# Patient Record
Sex: Male | Born: 1965
Health system: Southern US, Community
[De-identification: ages and names within clinical notes are randomized; demographics above are authoritative.]

## PROBLEM LIST (undated history)

## (undated) DIAGNOSIS — D126 Benign neoplasm of colon, unspecified: Secondary | ICD-10-CM

## (undated) DIAGNOSIS — N390 Urinary tract infection, site not specified: Secondary | ICD-10-CM

## (undated) DIAGNOSIS — R7989 Other specified abnormal findings of blood chemistry: Secondary | ICD-10-CM

## (undated) DIAGNOSIS — R600 Localized edema: Secondary | ICD-10-CM

## (undated) DIAGNOSIS — M549 Dorsalgia, unspecified: Secondary | ICD-10-CM

## (undated) DIAGNOSIS — E079 Disorder of thyroid, unspecified: Secondary | ICD-10-CM

## (undated) DIAGNOSIS — N200 Calculus of kidney: Secondary | ICD-10-CM

## (undated) DIAGNOSIS — M1990 Unspecified osteoarthritis, unspecified site: Secondary | ICD-10-CM

## (undated) DIAGNOSIS — E039 Hypothyroidism, unspecified: Secondary | ICD-10-CM

## (undated) DIAGNOSIS — G8929 Other chronic pain: Secondary | ICD-10-CM

## (undated) DIAGNOSIS — E119 Type 2 diabetes mellitus without complications: Secondary | ICD-10-CM

## (undated) DIAGNOSIS — E2740 Unspecified adrenocortical insufficiency: Secondary | ICD-10-CM

## (undated) DIAGNOSIS — G2581 Restless legs syndrome: Secondary | ICD-10-CM

## (undated) DIAGNOSIS — T4145XA Adverse effect of unspecified anesthetic, initial encounter: Secondary | ICD-10-CM

## (undated) DIAGNOSIS — G4730 Sleep apnea, unspecified: Secondary | ICD-10-CM

## (undated) DIAGNOSIS — J45909 Unspecified asthma, uncomplicated: Secondary | ICD-10-CM

## (undated) DIAGNOSIS — M48061 Spinal stenosis, lumbar region without neurogenic claudication: Secondary | ICD-10-CM

## (undated) DIAGNOSIS — M722 Plantar fascial fibromatosis: Secondary | ICD-10-CM

## (undated) DIAGNOSIS — I10 Essential (primary) hypertension: Secondary | ICD-10-CM

## (undated) DIAGNOSIS — Z9889 Other specified postprocedural states: Secondary | ICD-10-CM

## (undated) DIAGNOSIS — T8859XA Other complications of anesthesia, initial encounter: Secondary | ICD-10-CM

## (undated) DIAGNOSIS — K589 Irritable bowel syndrome without diarrhea: Secondary | ICD-10-CM

## (undated) DIAGNOSIS — E785 Hyperlipidemia, unspecified: Secondary | ICD-10-CM

## (undated) DIAGNOSIS — Z87442 Personal history of urinary calculi: Secondary | ICD-10-CM

## (undated) DIAGNOSIS — K219 Gastro-esophageal reflux disease without esophagitis: Secondary | ICD-10-CM

## (undated) HISTORY — PX: CYSTOSCOPY: SUR368

## (undated) HISTORY — DX: Unspecified adrenocortical insufficiency: E27.40

## (undated) HISTORY — DX: Other specified postprocedural states: Z98.890

## (undated) HISTORY — DX: Plantar fascial fibromatosis: M72.2

## (undated) HISTORY — PX: HERNIA REPAIR: SHX51

## (undated) HISTORY — DX: Essential (primary) hypertension: I10

## (undated) HISTORY — DX: Localized edema: R60.0

## (undated) HISTORY — PX: KNEE SURGERY: SHX244

## (undated) HISTORY — DX: Other specified abnormal findings of blood chemistry: R79.89

## (undated) HISTORY — DX: Irritable bowel syndrome, unspecified: K58.9

## (undated) HISTORY — DX: Urinary tract infection, site not specified: N39.0

## (undated) HISTORY — DX: Hyperlipidemia, unspecified: E78.5

## (undated) HISTORY — DX: Benign neoplasm of colon, unspecified: D12.6

---

## 1998-06-04 DIAGNOSIS — D126 Benign neoplasm of colon, unspecified: Secondary | ICD-10-CM

## 1998-06-04 HISTORY — PX: COLONOSCOPY: SHX174

## 1998-06-04 HISTORY — DX: Benign neoplasm of colon, unspecified: D12.6

## 2000-07-25 ENCOUNTER — Inpatient Hospital Stay (HOSPITAL_COMMUNITY): Admission: EM | Admit: 2000-07-25 | Discharge: 2000-07-29 | Payer: Self-pay | Admitting: *Deleted

## 2001-06-04 HISTORY — PX: SPLENECTOMY: SUR1306

## 2001-10-29 ENCOUNTER — Encounter: Payer: Self-pay | Admitting: Emergency Medicine

## 2001-10-29 ENCOUNTER — Inpatient Hospital Stay (HOSPITAL_COMMUNITY): Admission: EM | Admit: 2001-10-29 | Discharge: 2001-10-30 | Payer: Self-pay | Admitting: Emergency Medicine

## 2003-08-04 ENCOUNTER — Emergency Department (HOSPITAL_COMMUNITY): Admission: EM | Admit: 2003-08-04 | Discharge: 2003-08-04 | Payer: Self-pay | Admitting: Emergency Medicine

## 2003-10-08 ENCOUNTER — Ambulatory Visit (HOSPITAL_COMMUNITY): Admission: RE | Admit: 2003-10-08 | Discharge: 2003-10-08 | Payer: Self-pay | Admitting: Internal Medicine

## 2004-06-04 HISTORY — PX: CHOLECYSTECTOMY: SHX55

## 2005-07-02 ENCOUNTER — Emergency Department (HOSPITAL_COMMUNITY): Admission: EM | Admit: 2005-07-02 | Discharge: 2005-07-02 | Payer: Self-pay | Admitting: Emergency Medicine

## 2005-07-13 ENCOUNTER — Ambulatory Visit (HOSPITAL_COMMUNITY): Admission: RE | Admit: 2005-07-13 | Discharge: 2005-07-13 | Payer: Self-pay | Admitting: Family Medicine

## 2005-07-30 ENCOUNTER — Ambulatory Visit: Payer: Self-pay | Admitting: Internal Medicine

## 2005-08-02 ENCOUNTER — Ambulatory Visit (HOSPITAL_COMMUNITY): Admission: RE | Admit: 2005-08-02 | Discharge: 2005-08-02 | Payer: Self-pay | Admitting: Internal Medicine

## 2005-08-02 ENCOUNTER — Encounter: Payer: Self-pay | Admitting: Internal Medicine

## 2005-08-12 ENCOUNTER — Ambulatory Visit: Payer: Self-pay | Admitting: Internal Medicine

## 2005-09-11 ENCOUNTER — Ambulatory Visit: Payer: Self-pay | Admitting: Internal Medicine

## 2005-10-19 ENCOUNTER — Encounter (HOSPITAL_COMMUNITY): Admission: RE | Admit: 2005-10-19 | Discharge: 2005-11-18 | Payer: Self-pay | Admitting: Family Medicine

## 2005-10-26 ENCOUNTER — Encounter (INDEPENDENT_AMBULATORY_CARE_PROVIDER_SITE_OTHER): Payer: Self-pay | Admitting: Specialist

## 2005-10-26 ENCOUNTER — Ambulatory Visit (HOSPITAL_COMMUNITY): Admission: RE | Admit: 2005-10-26 | Discharge: 2005-10-26 | Payer: Self-pay | Admitting: General Surgery

## 2005-11-26 ENCOUNTER — Ambulatory Visit (HOSPITAL_COMMUNITY): Admission: RE | Admit: 2005-11-26 | Discharge: 2005-11-26 | Payer: Self-pay | Admitting: Internal Medicine

## 2006-02-07 ENCOUNTER — Ambulatory Visit (HOSPITAL_BASED_OUTPATIENT_CLINIC_OR_DEPARTMENT_OTHER): Admission: RE | Admit: 2006-02-07 | Discharge: 2006-02-07 | Payer: Self-pay | Admitting: Orthopedic Surgery

## 2006-02-18 ENCOUNTER — Encounter: Admission: RE | Admit: 2006-02-18 | Discharge: 2006-02-28 | Payer: Self-pay | Admitting: Orthopedic Surgery

## 2006-12-16 ENCOUNTER — Ambulatory Visit: Payer: Self-pay | Admitting: Cardiovascular Disease

## 2006-12-16 ENCOUNTER — Inpatient Hospital Stay (HOSPITAL_COMMUNITY): Admission: EM | Admit: 2006-12-16 | Discharge: 2006-12-17 | Payer: Self-pay | Admitting: Emergency Medicine

## 2006-12-20 ENCOUNTER — Encounter (HOSPITAL_COMMUNITY): Admission: RE | Admit: 2006-12-20 | Discharge: 2007-01-19 | Payer: Self-pay | Admitting: Cardiovascular Disease

## 2006-12-20 ENCOUNTER — Ambulatory Visit: Payer: Self-pay | Admitting: Cardiology

## 2007-04-16 ENCOUNTER — Ambulatory Visit (HOSPITAL_COMMUNITY): Admission: RE | Admit: 2007-04-16 | Discharge: 2007-04-16 | Payer: Self-pay | Admitting: Internal Medicine

## 2008-06-04 HISTORY — PX: ESOPHAGOGASTRODUODENOSCOPY: SHX1529

## 2008-08-02 DIAGNOSIS — Z9889 Other specified postprocedural states: Secondary | ICD-10-CM

## 2008-08-02 HISTORY — DX: Other specified postprocedural states: Z98.890

## 2008-08-06 ENCOUNTER — Ambulatory Visit: Payer: Self-pay | Admitting: Internal Medicine

## 2008-08-09 ENCOUNTER — Encounter: Payer: Self-pay | Admitting: Internal Medicine

## 2008-08-10 ENCOUNTER — Ambulatory Visit (HOSPITAL_COMMUNITY): Admission: RE | Admit: 2008-08-10 | Discharge: 2008-08-10 | Payer: Self-pay | Admitting: Internal Medicine

## 2008-08-10 ENCOUNTER — Ambulatory Visit: Payer: Self-pay | Admitting: Internal Medicine

## 2008-08-10 ENCOUNTER — Encounter: Payer: Self-pay | Admitting: Internal Medicine

## 2008-08-10 HISTORY — PX: COLONOSCOPY: SHX174

## 2008-08-13 ENCOUNTER — Encounter: Payer: Self-pay | Admitting: Internal Medicine

## 2008-12-29 ENCOUNTER — Encounter (INDEPENDENT_AMBULATORY_CARE_PROVIDER_SITE_OTHER): Payer: Self-pay | Admitting: *Deleted

## 2008-12-31 ENCOUNTER — Ambulatory Visit (HOSPITAL_COMMUNITY): Admission: RE | Admit: 2008-12-31 | Discharge: 2008-12-31 | Payer: Self-pay | Admitting: Family Medicine

## 2009-01-04 DIAGNOSIS — K828 Other specified diseases of gallbladder: Secondary | ICD-10-CM | POA: Insufficient documentation

## 2009-01-04 DIAGNOSIS — R042 Hemoptysis: Secondary | ICD-10-CM | POA: Insufficient documentation

## 2009-01-04 DIAGNOSIS — K219 Gastro-esophageal reflux disease without esophagitis: Secondary | ICD-10-CM | POA: Insufficient documentation

## 2009-01-04 DIAGNOSIS — K449 Diaphragmatic hernia without obstruction or gangrene: Secondary | ICD-10-CM | POA: Insufficient documentation

## 2009-01-04 DIAGNOSIS — R109 Unspecified abdominal pain: Secondary | ICD-10-CM | POA: Insufficient documentation

## 2009-01-04 DIAGNOSIS — K921 Melena: Secondary | ICD-10-CM | POA: Insufficient documentation

## 2009-01-05 ENCOUNTER — Ambulatory Visit: Payer: Self-pay | Admitting: Internal Medicine

## 2009-01-05 DIAGNOSIS — R1310 Dysphagia, unspecified: Secondary | ICD-10-CM | POA: Insufficient documentation

## 2009-01-05 DIAGNOSIS — K589 Irritable bowel syndrome without diarrhea: Secondary | ICD-10-CM | POA: Insufficient documentation

## 2009-01-05 DIAGNOSIS — R634 Abnormal weight loss: Secondary | ICD-10-CM | POA: Insufficient documentation

## 2009-01-05 DIAGNOSIS — R635 Abnormal weight gain: Secondary | ICD-10-CM | POA: Insufficient documentation

## 2009-01-05 DIAGNOSIS — F449 Dissociative and conversion disorder, unspecified: Secondary | ICD-10-CM | POA: Insufficient documentation

## 2009-01-06 ENCOUNTER — Encounter: Payer: Self-pay | Admitting: Internal Medicine

## 2009-01-07 ENCOUNTER — Encounter: Payer: Self-pay | Admitting: Internal Medicine

## 2009-01-10 ENCOUNTER — Ambulatory Visit (HOSPITAL_COMMUNITY): Admission: RE | Admit: 2009-01-10 | Discharge: 2009-01-10 | Payer: Self-pay | Admitting: Internal Medicine

## 2009-01-10 ENCOUNTER — Ambulatory Visit: Payer: Self-pay | Admitting: Internal Medicine

## 2009-01-10 DIAGNOSIS — Z9889 Other specified postprocedural states: Secondary | ICD-10-CM

## 2009-01-10 HISTORY — DX: Other specified postprocedural states: Z98.890

## 2009-01-24 ENCOUNTER — Ambulatory Visit (HOSPITAL_COMMUNITY): Admission: RE | Admit: 2009-01-24 | Discharge: 2009-01-24 | Payer: Self-pay | Admitting: Family Medicine

## 2009-01-31 ENCOUNTER — Emergency Department (HOSPITAL_COMMUNITY): Admission: EM | Admit: 2009-01-31 | Discharge: 2009-01-31 | Payer: Self-pay | Admitting: Emergency Medicine

## 2009-02-15 ENCOUNTER — Ambulatory Visit (HOSPITAL_COMMUNITY): Admission: RE | Admit: 2009-02-15 | Discharge: 2009-02-15 | Payer: Self-pay | Admitting: Family Medicine

## 2009-03-17 ENCOUNTER — Telehealth (INDEPENDENT_AMBULATORY_CARE_PROVIDER_SITE_OTHER): Payer: Self-pay

## 2009-04-20 ENCOUNTER — Ambulatory Visit (HOSPITAL_COMMUNITY): Admission: RE | Admit: 2009-04-20 | Discharge: 2009-04-20 | Payer: Self-pay | Admitting: Family Medicine

## 2009-09-25 ENCOUNTER — Emergency Department (HOSPITAL_COMMUNITY): Admission: EM | Admit: 2009-09-25 | Discharge: 2009-09-25 | Payer: Self-pay | Admitting: Emergency Medicine

## 2010-02-15 ENCOUNTER — Emergency Department (HOSPITAL_COMMUNITY): Admission: EM | Admit: 2010-02-15 | Discharge: 2010-02-15 | Payer: Self-pay | Admitting: Emergency Medicine

## 2010-08-17 LAB — URINALYSIS, ROUTINE W REFLEX MICROSCOPIC
Bilirubin Urine: NEGATIVE
Nitrite: NEGATIVE
Protein, ur: NEGATIVE mg/dL
Specific Gravity, Urine: 1.015 (ref 1.005–1.030)
Urobilinogen, UA: 0.2 mg/dL (ref 0.0–1.0)

## 2010-08-17 LAB — CBC
MCH: 27.9 pg (ref 26.0–34.0)
MCV: 83.9 fL (ref 78.0–100.0)
Platelets: 352 10*3/uL (ref 150–400)
RBC: 5.18 MIL/uL (ref 4.22–5.81)
RDW: 15 % (ref 11.5–15.5)
WBC: 10.1 10*3/uL (ref 4.0–10.5)

## 2010-08-17 LAB — POCT CARDIAC MARKERS
Myoglobin, poc: 59.8 ng/mL (ref 12–200)
Troponin i, poc: <0.05 ng/mL (ref 0.00–0.09)

## 2010-08-17 LAB — COMPREHENSIVE METABOLIC PANEL
ALT: 24 U/L (ref 0–53)
Alkaline Phosphatase: 52 U/L (ref 39–117)
Glucose, Bld: 97 mg/dL (ref 70–99)
Potassium: 3.4 mEq/L — ABNORMAL LOW (ref 3.5–5.1)
Sodium: 138 mEq/L (ref 135–145)
Total Protein: 7 g/dL (ref 6.0–8.3)

## 2010-08-17 LAB — DIFFERENTIAL
Basophils Relative: 0 % (ref 0–1)
Eosinophils Absolute: 0.1 10*3/uL (ref 0.0–0.7)
Monocytes Absolute: 0.9 10*3/uL (ref 0.1–1.0)
Monocytes Relative: 9 % (ref 3–12)
Neutrophils Relative %: 66 % (ref 43–77)

## 2010-09-09 LAB — CBC
Platelets: 325 10*3/uL (ref 150–400)
WBC: 7.2 10*3/uL (ref 4.0–10.5)

## 2010-09-09 LAB — PROTIME-INR
INR: 0.9 (ref 0.00–1.49)
Prothrombin Time: 12.4 seconds (ref 11.6–15.2)

## 2010-09-09 LAB — DIFFERENTIAL
Lymphocytes Relative: 37 % (ref 12–46)
Lymphs Abs: 2.7 10*3/uL (ref 0.7–4.0)
Neutro Abs: 3.3 10*3/uL (ref 1.7–7.7)
Neutrophils Relative %: 46 % (ref 43–77)

## 2010-10-17 NOTE — Discharge Summary (Signed)
NAME:  [name] [name], [name] [name]                  ACCOUNT NO.:  [id number]   MEDICAL RECORD NO.:  [id number]          PATIENT TYPE:  INP   LOCATION:  A217                          FACILITY:  [facility]   PHYSICIAN:  Marcello Moores, MD   DATE OF BIRTH:  Apr 25, 1966   DATE OF ADMISSION:  12/16/2006  DATE OF DISCHARGE:  LH                               DISCHARGE SUMMARY   PRIMARY CARE PHYSICIAN:  Patrica Duel, M.D.   DISCHARGE DIAGNOSES:  1. Atypical chest pain.  Acute coronary syndrome was ruled out.  2. Borderline hypertension   PAST SURGICAL HISTORY:  1. History of splenectomy.  2. Cholecystectomy.  3. Knee surgery.   DISCHARGE MEDICATIONS:  None.   HOSPITAL COURSE:  Mr. Haupert is a 45 year old male patient with no  significant past medical history except as mentioned above. He stated  that he had a stress test 20 years ago which was in his 71s, not sure  what it was. He presented with atypical chest pain, and he was  investigated with serial cardiac enzymes, which were normal, and serial  EKG which was normal.  Echocardiogram was done.  Per preliminary report,  there was no significant abnormality. The patient remains symptomless  and painless while he was in the hospital. He is stable with vital  signs, temperature 97, pulse rate 58, respiratory rate 20, and blood  pressure 143/77. Three sets of cardiac markers are within normal range.  Chemistry as well as hematology were within normal range.  His  cholesterol profile showed total cholesterol of 263, triglyceride 307,  LDL 176, HDL  26, and VLDL is 61.   The patient is advised to have followup with his primary care physician  in the coming 1 week and to repeat his cholesterol levels and discuss  with his primary care physician if he needs any further treatment  including his blood pressure.      Marcello Moores, MD  Electronically Signed    MT/MEDQ  D:  12/17/2006  T:  12/18/2006  Job:  [id number]   cc:   Patrica Duel, M.D.  Fax:  [phone]

## 2010-10-17 NOTE — Procedures (Signed)
NAME:  [name] [name], [name] [name]                  ACCOUNT NO.:  [id number]   MEDICAL RECORD NO.:  [id number]          PATIENT TYPE:  INP   LOCATION:  A217                          FACILITY:  [facility]   PHYSICIAN:  Peter C. Eden Emms, MD, FACCDATE OF BIRTH:  [birth year]   DATE OF PROCEDURE:  12/17/2006  DATE OF DISCHARGE:                                ECHOCARDIOGRAM   INDICATIONS:  A 45 year old with chest pain   Left ventricular cavity size was mildly dilated.  There was borderline  LVH with a septal thickness of 11 mm.  There was no regional wall motion  abnormalities.  EF was 60%.  There was left atrium and right sided  cardiac chamber which were normal in size and function.  There was no  evidence of pulmonary hypertension.  Aortic, mitral and tricuspid valves  were normal.  There were no significant stenoses or regurgitation.  Subcostal imaging revealed no atrial septal defect, no source of  embolus, no effusion.   M-mode measurement showed an aortic dimension of 31 mm, left atrial  dimension 40 mm, septal thickness 12 mm, LV diastolic dimension 48 mm,  LV systolic dimension 35 mm.   FINAL IMPRESSION:  1. Mild left ventricular cavity enlargement with mild LVH, EF 60%.  2. Mild left atrial enlargement.  3. Normal right sided cardiac chambers and no evidence of pulmonary      hypertension.  4. Normal aortic valve.  5. Normal mitral valve.  6. No pericardial effusion.   The patient's echocardiogram is essentially benign.  He will be  discharged later today for an outpatient Myoview.      Noralyn Pick. Eden Emms, MD, [facility]  Electronically Signed     PCN/MEDQ  D:  12/17/2006  T:  12/18/2006  Job:  [id number]

## 2010-10-17 NOTE — H&P (Signed)
NAME:  [name] [name], [name] [name]                  ACCOUNT NO.:  [id number]   MEDICAL RECORD NO.:  [id number]          PATIENT TYPE:  AMB   LOCATION:  DAY                           FACILITY:  [facility]   PHYSICIAN:  R. Roetta Sessions, M.D. DATE OF BIRTH:  [birth year]   DATE OF ADMISSION:  DATE OF DISCHARGE:  LH                              HISTORY & PHYSICAL   PRIMARY CARE PHYSICIAN:  Patrica Duel, MD   CHIEF COMPLAINT:  Due for surveillance colonoscopy and history of  adenomatous colon polyps.   HISTORY OF PRESENT ILLNESS:  [name] [name] is a 45-year Caucasian male.  He  has noticed some bright red scant amounts of rectal bleeding with  wiping.  He tells me he has a history of hemorrhoids.  He feels that he  may need surgery on these.  He also has history of adenomatous colon  polyps which is why he was here for followup today.  He had a  colonoscopy on August 03, 1998 by Dr. Jena Gauss and at that time he was found  to external hemorrhoid tags.  A polyp on a stalk was removed from the  mid ascending colon and was resected with cold snare.  It was found to  be a tubular adenoma, but the margin was not identified.  He has a  history of IBS.  He occasionally have bilateral lower quadrant abdominal  pain.  He has lost 20 pounds intentionally.  He generally has bowel once  or twice a day.  Occasionally, he has nausea, but denies any vomiting.  Certain foods seem to cause him heartburn as does stress.  He has been  taking omeprazole 20 mg on p.r.n. basis.  He tells me at times he may  take it twice a day, but other week, he may take it once a week.  He  does occasionally have a sensation of globus.  Denies any history of  dysphagia or odynophagia.  Denies any problems with constipation.   PAST MEDICAL AND SURGICAL HISTORY:  Adenomatous colon polyp as described  in HPI.  IBS, hemorrhoids, sleep apnea, chronic back pain, splenectomy,  cholecystectomy, left knee surgery, and GERD.  He also has a history of  ITP and  asthma.  He also has family history of colon cancer in his  father, who died at age 70.  He has history of chronic complicated GERD  with erosive reflux esophagitis and last EGD done by Dr. Jena Gauss on August 02, 2005 and he has small hiatal hernia.   CURRENT MEDICATIONS:  Omeprazole 20 mg p.r.n.   ALLERGIES:  No known drug allergies.   FAMILY HISTORY:  Positive for father deceased at 63 secondary colorectal  carcinoma.  One sister has history of sickle cell trait.  One brother is  healthy.  His mother's age is 67 and healthy.   SOCIAL HISTORY:  [name] [name] is married.  He has a healthy 6 year old son.  He is a Naval architect.  He continues to smoke about 2 cigars every couple  days.  He quit drinking alcohol heavily about 1 year ago.  He does  occasionally use marijuana, but has not recently.   REVIEW OF SYSTEMS:  See HPI, otherwise negative.   PHYSICAL EXAMINATION:  VITAL SIGNS:  Weight 260.5 pounds, height 57  inches, temperature 97.8, blood pressure 142/92, and pulse 72.  GENERAL:  [name] [name] is an obese, Caucasian male.  He is alert, oriented,  pleasant cooperative, in no acute distress.  HEENT:  Sclerae clear, nonicteric. Conjunctivae pink.  Oropharynx pink  and moist without lesions.  NECK:  Supple without any mass or thyromegaly.  CHEST:  Heart regular rate and rhythm.  Normal S1 and S2.  No murmurs,  clicks, rubs, or gallops.  LUNGS: Clear auscultation to bilaterally.  ABDOMEN:  Protuberant with positive bowel sounds x4.  No bruits  auscultated.  Soft and nondistended.  He does have mild tenderness to  bilateral lower quadrants on deep palpation around the umbilicus.  There  is no rebound tenderness or guarding.  No hepatosplenomegaly or mass.  EXTREMITIES:  Without clubbing or edema.   ASSESSMENT:  [name] [name] is a 45 year old Caucasian male with history of  adenomatous colon polyps and family history of colon carcinoma.  He has  a history of hemorrhoids, has been having some  intermittent small-volume  hematochezia.  He is due for surveillance on his colon polyps and we  also need to determine the etiology of his rectal bleeding.  I suspect  he may need surgical intervention for his hemorrhoids if this was found  to be the culprit.   He also has GERD and from our conversation it appears that he is having  probably symptoms more days of the week than not at this point.  So, I  feel it may benefit him to have daily PPI.   PLAN:  1. Begin omeprazole 20 mg daily, #30 one with 5 refills.  2. Colonoscopy with Dr. Jena Gauss in the near future.  I will discuss the      procedure including risks and benefits include, but not limited to      bleeding, infection, perforation, drug reaction.  He agrees with      this plan and consent will be obtained.       Lorenza Burton, N.P.      Jonathon Bellows, M.D.  Electronically Signed    KJ/MEDQ  D:  08/06/2008  T:  08/07/2008  Job:  [id number]   cc:   Patrica Duel, M.D.  Fax: [phone]

## 2010-10-17 NOTE — H&P (Signed)
NAMEMATAN, [name] NO.:  [id number]   MEDICAL RECORD NO.:  [id number]          PATIENT TYPE:  INP   LOCATION:  A217                          FACILITY:  [facility]   PHYSICIAN:  Osvaldo Shipper, MD     DATE OF BIRTH:  1965-10-27   DATE OF ADMISSION:  12/16/2006  DATE OF DISCHARGE:  LH                              HISTORY & PHYSICAL   PRIMARY MEDICAL DOCTOR:  Patrica Duel, M.D. with [facility].   ADMISSION DIAGNOSES:  1. Chest pain, rule out acute coronary syndrome.  2. Obesity.  3. History of acid reflux disease.   HISTORY OF PRESENT ILLNESS:  Patient is a 45 year old Caucasian male who  currently is not on any medications and has a past history of  hypertension, which resolved, according to the patient.  Patient  presented to the ED today complaining of chest pain.  The chest pain  actually started on Saturday in the retrosternal area, described as a  pressure-like sensation, 8-10/10 in intensity, radiating sometimes to  the jaw, sometimes to the left shoulder area, and sometimes a little bit  posteriorly to the left shoulder and back.  He describes some burning  sensation in the left arm area as well.  The pain is not brought on by  any specific activity.  He states that it seems to be aggravated when he  moves his arm.  Occasionally, he has felt some squeezing and tightness  as well.  He was given 3 nitroglycerin, which seemed to somewhat  dissipate the pain but did not completely relieve the pain.  He was  given some Dilaudid finally in the ED, which seemed to help the pain  most, but he still has about 5-10 retrosternal chest discomfort.  He  seems to be lying comfortably in the bed in no distress.  He also  mentioned some shortness of breath with this chest pain.  No cough,  fever, or chills.  No nausea or vomiting.  He also had some diaphoresis  associated with the chest pain.  No palpitations.  Did not have any  dizziness or syncopal episode.   He says that he occasionally has  numbness in his hands, but that is an ongoing problem.  He also admits  to snoring in the nighttime and waking up in the nighttime with symptoms  suggestive of acid reflux.  He states that he has been talking to his  doctor about getting a test done for sleep apnea.   HOME MEDICATIONS:  None.   He is not allergic to any medications that he knows of.   PAST MEDICAL HISTORY:  He was diagnosed with hypertension about 10 years  ago and was on some medications, but he has not been on any medications  for the past many years.  He has had a stress test back in 1998.  He  does not know the results of these.  He never had a cardiac cath,  history of diabetes or strokes.  He gets acid reflux quite frequently.  He has been seen by Dr. Rito Ehrlich, urologist,  for some issues related to  his prostate, which is also resolved, according to the patient.   Surgical history consists of laparoscopic cholecystectomy in 2007,  splenectomy for ITP in 2003.  He has had knee surgery for cartilage  removal.   SOCIAL HISTORY:  Lives in [place] with his wife and family.  Quit smoking in March.  Was smoking less than a half pack of cigarettes  per day.  Was using marijuana in the past.  Quit since March.  Denied  any IV drug use.  He is a Naval architect by occupation.   FAMILY HISTORY:  Father died of colon cancer at the age of 76.  Mother  is alive and well.  His maternal grandfather died of heart disease at  the age of 36.  His siblings do not have any heart trouble, that he  knows of.   REVIEW OF SYSTEMS:  GENERAL:  Unremarkable.  HEENT:  Unremarkable.  CARDIOVASCULAR:  See HPI.  RESPIRATORY:  See HPI.  GI:  Unremarkable.  GU:  Unremarkable.  VASCULAR:  Unremarkable.  NEUROLOGIC:  Unremarkable.  PSYCHIATRIC:  Unremarkable.  DERMATOLOGIC:  Unremarkable.  ENDOCRINE:  Unremarkable except for a weight gain of 40 pounds since April of this  year.   PHYSICAL  EXAMINATION:  VITAL SIGNS:  When he presented to the ED, he was  afebrile with a temperature of 97.5, blood pressure 126/95, heart rate  in the 90s.  Regular respiratory rate was about 20.  Saturation of 97%  on room air.  Currently, his vital signs are all stable.  GENERAL:  An obese white male in no distress, lying comfortably on the  bed.  HEENT:  No pallor, no icterus.  Oral mucous membranes are moist.  No old  lesions are noted.  NECK:  Soft and supple.  No thyromegaly is appreciated.  CARDIOVASCULAR:  S1 and S2 is normal and regular.  No murmurs  appreciated.  No S3 or S4.  No bruits heard.  CHEST WALL:  Not tender to palpation.  ABDOMEN:  Soft, nontender, nondistended.  Bowel sounds are present.  No  organomegaly is appreciated.  Scars from previous surgery noted.  LUNGS:  Clear to auscultation bilaterally.  No wheezes, rales, or  rhonchi.  EXTREMITIES:  Without edema.  Peripheral pulses are palpable.  NEUROLOGIC:  Patient is alert and oriented x3.  No focal neurological  deficits are present.   LAB DATA:  His CBC is unremarkable.  D-dimer was normal.  His CMET was  normal.  The first set of cardiac markers are unremarkable.   Chest x-ray showed lower lung volumes but otherwise no acute changes  noted.  Specifically, no mention of mediastinal widening.   EKG shows a sinus rhythm.  There is left axis deviation.  The intervals  appear to be in the normal range.  I do not appreciate any definite Q  waves.  I do not see any significant ST-T wave changes that are of  concern at this time.   ASSESSMENT/PLAN:  This is a 45 year old Caucasian male who has really no  medical problems except for obesity and a remote history of hypertension  who presents with chest pain off and on for the past two days.  Some of  the features of pain are typical, some are atypical.  I think he needs  further evaluation to rule out coronary artery disease.  Acid reflux  disease could also be  contributing.  Musculoskeletal etiology is less  likely.  Other remote possibilities include pulmonary embolus, which is  unlikely with a normal D-dimer.  Aortic dissection, is also a remote  possibility.   PLAN:  1. Chest pain:  Will admit him to telemetry, rule him out for acute      coronary syndrome by serial cardiac markers.  Repeat EKG.  Will      have cardiology consultation in the morning, do a 2D      echocardiogram.  If he rules out and if his pain resolves, he may      be a candidate just for outpatient stress test.  If his pain      persists, I will defer to cardiology on further disposition.      Aspirin will be given.  Lipid profile will be checked in the      morning.  I will also check his blood pressure in both his arms.  2. Weight gain:  Will check a TSH.  Proceed from there.  He may need      outpatient evaluation for this weight gain.  3. Deep venous thrombosis/gastrointestinal prophylaxis will be      initiated.  4. He also needs outpatient evaluation for a sleep study.  5. We will check a urine drug screen as well.   Further management decision will be based on the results of initial  testing and patient's response to treatment.      Osvaldo Shipper, MD  Electronically Signed     GK/MEDQ  D:  12/16/2006  T:  12/17/2006  Job:  [id number]   cc:   Patrica Duel, M.D.  Fax: [phone]

## 2010-10-17 NOTE — Consult Note (Signed)
NAME:  [name] [name], [name] [name]                  ACCOUNT NO.:  [id number]   MEDICAL RECORD NO.:  [id number]          PATIENT TYPE:  INP   LOCATION:  A217                          FACILITY:  [facility]   PHYSICIAN:  Peter C. Eden Emms, MD, FACCDATE OF BIRTH:  [birth year]   DATE OF CONSULTATION:  12/17/2006  DATE OF DISCHARGE:                                 CONSULTATION   [name] [name] is a 45 year old patient admitted by [facility] for atypical  chest pain.   The patient has had anywhere from 5-8/10 chest pain since last Saturday.  The pain was new onset.  It radiates to the shoulder and jaw.  The pain  does have an occasional burning sensation to it.   It is not necessarily exertional.  He has had it off and on since being  admitted.   It is aggravated sometimes by left arm movement.   The patient had nitroglycerin with some relief, but not classic instant  relief.   The patient also noted relief with Dilaudid.  However, the pain seemed  to come back.  There was no associated nausea or vomiting.   The patient has had occasional numbness in his hands.   He continues to have some sharp pin-like sensation over the left  shoulder this morning.   The patient has no history of coronary artery disease.   PAST MEDICAL HISTORY:  1. Hypertension, currently untreated.  2. He has had a history of ITP, with splenectomy.  3. He is a previous smoker, quitting in March 2008.  4. He denies any drug use.  5. He has no significant allergies.  6. The patient has had a stress test maybe 20 years ago, but no recent      workup.  7. Laparoscopic cholecystectomy, splenectomy in 2003.  8. Previous arthroscopic knee surgery.   The patient lives in [place] with his wife and family.  He quit  smoking in March 2008.   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   His father died of colon cancer at age 73.  Mother is alive and well.  He did have a maternal grandfather who died of a heart attack at age 52.   REVIEW OF SYSTEMS:  Remarkable for some snoring and possible sleep  apnea.  He has never been worked up for this.  Otherwise negative.   PHYSICAL EXAMINATION:  VITAL SIGNS:  Blood pressure 120/85, heart rate  is 80 and regular, respiratory rate is 14.  Weight was not entered in  the chart.  HEENT:  Normal.  NECK:  Carotids normal, without bruit.  There is no JVP elevation, no  lymphadenopathy, no thyromegaly.  LUNGS:  Clear, with good diaphragmatic motion.  No wheezing.  He does  have some pain to palpation over the left shoulder.  HEART:  There is an S1, S2, with normal heart sounds.  PMI is normal.  ABDOMEN:  Benign.  He is status post splenectomy and cholecystectomy.  Bowel sounds positive.  There is no tenderness, no hepatosplenomegaly,  no hepatojugular reflux.  EXTREMITIES:  Distal pulses are intact,  with no edema.  There is no  peripheral neuropathy.  NEUROLOGIC:  Nonfocal.  MUSCULOSKELETAL:  There is no muscular weakness.  SKIN:  Warm and dry.   His EKG is essentially normal, with insignificant Q waves in lead F.  Chest x-ray shows atelectasis.  His lab work is remarkable for a  negative D-dimer, negative cardiac enzymes.  Drug screen is negative.   IMPRESSION:  1. Atypical chest pain.  Sounds more musculoskeletal in nature.  The      patient will have a 2D echocardiogram today.  As long as there are      no regional wall motion abnormalities, he can be discharged for      outpatient Myoview.  2. History of hypertension.  Currently borderline.  I suspect it has      been exacerbated by some weight gain since he quit smoking.  Low-      salt diet with continued monitoring is in order.  He may be a      reasonable candidate for a low-dose ACE inhibitor.  3. History of snoring, with body habitus for sleep apnea.  Primary      care followup for possible sleep study and overnight oximetry.   I suspect the patient will be able to go home later today so long as his   echocardiogram does not show regional wall motion abnormality.      Noralyn Pick. Eden Emms, MD, [facility]  Electronically Signed     PCN/MEDQ  D:  12/17/2006  T:  12/17/2006  Job:  [id number]

## 2010-10-17 NOTE — Op Note (Signed)
NAME:  [name] [name], [name] [name]                  ACCOUNT NO.:  [id number]   MEDICAL RECORD NO.:  [id number]          PATIENT TYPE:  AMB   LOCATION:  DAY                           FACILITY:  [facility]   PHYSICIAN:  R. Roetta Sessions, M.D. DATE OF BIRTH:  06-May-1966   DATE OF PROCEDURE:  01/10/2009  DATE OF DISCHARGE:                               OPERATIVE REPORT   PROCEDURE PERFORMED:  Diagnostic EGD.   INDICATIONS FOR PROCEDURE:  A 45 year old gentleman with what amounts to  neck pain over the past several weeks.  He has felt something stick when  he swallows pointing to the base of his neck periodically.  He denies  true esophageal dysphagia.  He has a constant feeling of fullness in his  throat.  His typical reflux symptoms have been well-controlled on  omeprazole and now he is on AcipHex 20 grams orally daily.  EGD is now  being done to further evaluate his symptoms.  Risks, benefits,  alternatives and limitations have been discussed, questions answered.  Please see the documentation in the medical record.   PROCEDURE NOTE:  O2 saturation, blood pressure, pulse and respirations  were monitored throughout entire procedure.  Conscious sedation:  Versed  4 mg IV, Demerol 50 mg IV, Phenergan 25 mg IV diluted slow IV push to  augment conscious sedation.  Cetacaine spray for topical pharyngeal  anesthesia.   FINDINGS:  Examination of the hypopharynx revealed no abnormalities.  Tubular esophagus was easily intubated.  Esophageal mucosa was well  seen.  Esophageal mucosa appeared normal and tubular esophagus was  patent through the EG junction.   Stomach:  Gastric cavity was emptied and insufflated well with air.  Thorough examination of gastric mucosa including retroflex of the  proximal stomach, esophagogastric junction demonstrated no abnormalities  aside from a small hiatal hernia.  Pylorus was patent, easily traversed.  Examination of the bulb and second portion revealed no abnormalities.   IMPRESSION:  Normal esophagus, small hiatal hernia, otherwise normal  stomach D1 and D2.   1. Today's findings are reassuring as to his upper GI tract.  However,      he has symptoms that I feel are more in the ENT arena.  Given his      ongoing tobacco abuse and prior alcohol use, he certainly needs to      have further      evaluation.  I recommend a formal otolaryngologic evaluation for      his symptoms.  He is to continue proton pump inhibitor therapy for      chronic gastroesophageal reflux disease.  2. Will get him back to Dr. Nobie Putnam to orchestrate referral to      otolaryngologist.      R. Roetta Sessions, M.D.  Electronically Signed     RMR/MEDQ  D:  01/10/2009  T:  01/10/2009  Job:  [id number]   cc:   Patrica Duel, M.D.  Fax: [phone]

## 2010-10-17 NOTE — Op Note (Signed)
NAME:  [name] [name], [name] [name]                  ACCOUNT NO.:  [id number]   MEDICAL RECORD NO.:  [id number]          PATIENT TYPE:  AMB   LOCATION:  DAY                           FACILITY:  [facility]   PHYSICIAN:  R. Roetta Sessions, M.D. DATE OF BIRTH:  [birth year]   DATE OF PROCEDURE:  08/10/2008  DATE OF DISCHARGE:                               OPERATIVE REPORT   PROCEDURE:  Surveillance colonoscopy, biopsy.   INDICATIONS FOR PROCEDURE:  A 45 year old gentleman with a history  colonic adenoma, family history colon cancer.  He has history of  hemorrhoidal bleeding as well.  He has had intermittent hematochezia.  Last colonoscopy is 3 years ago.  Colonoscopy is now being done.  Risks,  benefits, alternatives and limitations have been reviewed and questions  answered.  Please see documentation in the medical record.   PROCEDURE NOTE:  O2 saturation, blood pressure, pulse and respirations  were monitored throughout the entire procedure.   CONSCIOUS SEDATION:  Versed 7 mg IV, Demerol 125 mg IV in divided doses.  Cetacaine spray for topical pharyngeal anesthesia.   INSTRUMENT USED:  Pentax video chip system.   FINDINGS:  Digital exam revealed two large external hemorrhoid tags and  a tight anal sphincter.  The prep was adequate.  Colon:  Colonic mucosa was surveyed from the rectosigmoid junction  through the left, transverse, right colon to the appendiceal orifice,  ileocecal valve and cecum.  These structures were well seen and  photographed for the record.  From this level the scope was slowly  withdrawn.  All previously mentioned mucosal surfaces were again seen.  The colonic mucosa appeared normal.  The scope was pulled down into the  rectum where thorough examination of the rectal mucosa including  retroflexed view of the anal verge, en face view of anal canal  demonstrated friable, in fact bleeding anal canal and a diminutive polyp  in at 10 cm from the anal verge.  This lesion was cold  biopsy/removed.  The remainder of the rectal mucosa appeared unremarkable.  The patient  tolerated the procedure well and was reacted in endoscopy.   IMPRESSION:  Prominent external anal canal hemorrhoids, friable.  Diminutive rectal polyp status post cold biopsy.  Remainder of the  rectal mucosa appeared normal, normal colon.  Cecal withdrawal time 12  minutes.   RECOMMENDATIONS:  1. Daily Benefiber fiber supplement, 1 tablespoon daily.  2. 10-day course of Anusol HC suppositories one per rectum at bedtime.  3. I suspect at minimal would recommend he return in 5 years for      follow-up colonoscopy pending review of path.      Jonathon Bellows, M.D.  Electronically Signed     RMR/MEDQ  D:  08/10/2008  T:  08/10/2008  Job:  [id number]   cc:   Patrica Duel, M.D.  Fax: [phone]

## 2010-10-20 NOTE — Consult Note (Signed)
NAME:  [name] [name], [name] [name]                            ACCOUNT NO.:  [id number]   MEDICAL RECORD NO.:  [id number]                   PATIENT TYPE:  EMS   LOCATION:  MINO                                 FACILITY:  [facility]   PHYSICIAN:  R. Roetta Sessions, M.D.              DATE OF BIRTH:  02/26/1966   DATE OF CONSULTATION:  09/30/2003  DATE OF DISCHARGE:  08/04/2003                                   CONSULTATION   PRIMARY CARE PHYSICIAN:  Dr. Yetta Numbers.   REASON FOR CONSULTATION:  Rectal bleeding and abdominal pain.   HISTORY OF PRESENT ILLNESS:  Mr. [name] [name] is a pleasant 45 year old  Caucasian male who was kindly sent over at the courtesy of Dr. Yetta Numbers  to further evaluate intermittent rectal bleeding.  He has alternating  constipation.  Has had small volume dark blood per rectum on occasion.  He  has had this, actually, for years.  Has not had any nausea or vomiting.  He  tells me he has weighed as much as 280 pounds in the past five years but has  dropped down to 245.  I saw this nice gentleman for rectal bleeding and/or  abdominal pain back in 1996.  CT scan was unrevealing at that time.  No  significant diagnosis made.  He did okay and was seen again in October 2000.  At that point in time, it was revealed that his father had just been  diagnosed with colorectal cancer and died at age 82.  Colonoscopy on March 06, 1999, demonstrated external hemorrhoids, normal rectum, normal colon.  It  was felt he had hemorrhoidal bleeding.  Since I last saw him, he also had a  significant illness back in 2003 with ITP for which he describes spending 30  days in the hospital at [facility] and ultimately underwent a splenectomy.  He  is doing well from that problem.  He has not had any upper GI tract symptoms  such as odynophagia, dysphagia, early signs of reflux symptoms, nausea or  vomiting.  No melena.  The patient reports also increased urinary frequency  and also urinary hesitancy from  time to time recently.  He has not had a  urinalysis.   PAST MEDICAL HISTORY:  1. ITP.  2. Abdominal pain.  3. Rectal bleeding as stated above.   PAST SURGICAL HISTORY:  Splenectomy.   CURRENT MEDICATIONS:  None.   ALLERGIES:  NO KNOWN DRUG ALLERGIES.   FAMILY HISTORY:  Mother has heart disease.  Father died at age 88 with  colorectal carcinoma.  Also, he has a maternal uncle who has succumb to some  unknown cancer and another uncle, who it sounds like, succumb to malignant  melanoma at age 52.   SOCIAL HISTORY:  The patient has been married for 17 years, one child.  He  works as a Naval architect for [employer].  He  smokes very rarely,  occasional social alcohol consumption.   REVIEW OF SYSTEMS:  As in history of present illness.   PHYSICAL EXAMINATION:  GENERAL APPEARANCE:  A tanned, 45 year old gentleman,  resting comfortably.  VITAL SIGNS:  Weight 245, height 5 feet 10 inches, blood pressure 120/90,  pulse 76.  SKIN:  Warm and dry, tanned.  No stigmata of chronic liver disease.  HEENT:  No scleral icterus.  Conjunctivae are pink.  CHEST:  Lungs are clear to auscultation.  CARDIAC:  Regular rate and rhythm without murmurs, rubs or gallops.  ABDOMEN:  Nondistended.  His laparoscopy port is well healed.  Soft,  nontender without appreciable mass or organomegaly.  EXTREMITIES:  No edema.  RECTAL:  Deferred.   IMPRESSION:  Mr. [name] [name] is a pleasant 45 year old gentleman with  intermittent abdominal pain, which he describes as generalized with  alternating constipation and diarrhea; has low volume blood per rectum as  well.  This has been going on for years.  He has a positive family history  for colorectal cancer.  Colonoscopy in 2000 demonstrated only external  hemorrhoids.  He has urinary tract symptoms which need to be evaluated as a  separate issue.   PLAN:  Colonoscopy in the near future with high risk screening, diagnostic  maneuver.  Discussed this  procedure with [name] [name].  Potential risks, benefits  and alternatives have been reviewed.  He is agreeable.  Will make plans to  do colonoscopy in the very near future.  Will also check a urinalysis today  and make further recommendations soon.   I would like to thank Dr. Yetta Numbers for allowing me to assist with this  nice gentleman.      ___________________________________________                                            [name] [name], M.D.   RMR/MEDQ  D:  09/30/2003  T:  09/30/2003  Job:  [id number]   cc:   Kirk Ruths, M.D.  [address]  [place]  [place] [address]  Fax: [phone]

## 2010-10-20 NOTE — H&P (Signed)
[facility]  Patient:    [name] [name], [name] [name] Visit Number: [id number] MRN: [id number]          Service Type: MED Location: 3A A325 01 Attending Physician:  Patrica Duel Dictated by:   Renne Musca, M.D. Admit Date:  10/29/2001 Discharge Date: 10/30/2001                           History and Physical  DATE OF BIRTH:  Apr 20, 1966.  HISTORY OF PRESENT ILLNESS:  The patient is a 45 year old Caucasian male in good health with no significant past medical history.  He developed petechia on his lower extremities approximately five days prior to admission with progressive increase in the lesions as well as increasing bruisability.  He presented to the emergency room today at the insistence of his family.  He had no other symptoms.  No headache, mental status changes, recent illnesses.  He is on no medications.  Uses no over-the-counter supplements or other substances with the exception of marijuana which has done for years.  He noted some diffuse abdominal fullness which involved his upper chest as well as his suprapubic area; however, this has been present for many years.  In fact, the patient had a workup including MRI of his abdomen in 1996 and apparently it was negative at that time.  He has noted some possible blood in his urine recently but nothing consistent.  No blood in his stools.  He notes today that he feels a little nauseated but has had no vomiting.  He has no respiratory complaints.  He has had no lymphadenopathy or other skin lesions noted.  LABORATORY DATA:  The workup in the emergency room revealed a platelet count of 6000 with relatively normal screening laboratories otherwise, which included a Chem-12, CBC, d-dimer, PT, and PTT.  PAST MEDICAL HISTORY:  No hospitalizations.  He underwent colonoscopy three years prior at the time his father was diagnosed with colon cancer with negative workup.  He notes his chronic abdominal pain which  is really diffuse and "lives with it."  He was evaluated by mental health for anxiety and apparently was tried on several medications but did not like the way they made him feel.  He says his marijuana works just fine.  PAST SURGICAL HISTORY:  No surgeries.  He has not had any problems with excessive bleeding in the past or similar symptoms that he recalls.  SOCIAL HISTORY:  Pertinent for being separated.  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  He has no history of exposures.  Rare tobacco use. Regular marijuana use as noted above.  Occasional alcohol.  He has had multiple sexual partners.  No IV drug use or other high risk behaviors.  He has one son, age 96, who is alive and well.  FAMILY HISTORY:  One brother and one sister, alive and well.  Father died at age 53 with metastatic colon cancer.  Mother has degenerative back disease and is applying for disability.  No cardiovascular, history of diabetes to the patients knowledge.  No bleeding disorders.  REVIEW OF SYSTEMS:  As above.  In addition, the patient was noted to have a cholesterol, he states, of about 400 multiple years ago but has never had any regular follow-ups for this.  MEDICATIONS:  None.  ALLERGIES:  None.  PHYSICAL EXAMINATION:  GENERAL:  Obese white male who is alert and appropriate with fully intact neurologic exam.  SKIN:  He has fine petechia in his lower extremities and several ecchymoses particularly on the volar surfaces of his upper extremities.  He has several petechia along his gums.  He has no frank bleeding noted.  RECTAL:  Deferred.  LUNGS:  Diminished secondary to habitus.  HEART:  Regular, though distant.  No murmurs noted.  VITAL SIGNS:  Rate is 74, blood pressure 140/70, respirations are unlabored.  ABDOMEN:  Obese.  Mild tenderness in the upper abdomen, though I am not sure I can appreciate any evidence or splenomegaly.  He has no inguinal, supraclavicular, neck, or axillary  adenopathy.  No other skin lesions are noted.  ASSESSMENT/PLAN: 1. Severe thrombocytopenia:  Most likely etiology in this gentleman is    idiopathic thrombocytopenic purpura given his nausea.  We will give    Solu-Medrol 2 gm intravenously tonight as well as intravenous    immunoglobulin.  Hematology consultation in morning.  Dr. Colon Branch    discussed the patients status with hematology on call.  A CT of the    abdomen has been obtained to assess further splenomegaly or adenopathy.    For completeness sake, HIV screen has been ordered at the patients    request actually given his sexually history and concerns.  ANA and TSH    are also pending.  The findings and plan were discussed with the patient    and his sister who seem to have reasonable understanding. 2. History of hyperlipidemia:  We will obtain lipid profile in the morning    for completeness sake and this can be dealt with as an outpatient. 3. Obesity. Dictated by:   Renne Musca, M.D. Attending Physician:  Patrica Duel DD:  10/29/01 TD:  10/30/01 Job: [id number]

## 2010-10-20 NOTE — Op Note (Signed)
NAME:  KROSBY, RITCHIE                              ACCOUNT NO.:  [id number]   MEDICAL RECORD NO.:  [id number]                  PATIENT TYPE:   LOCATION:                                       FACILITY:   PHYSICIAN:  R. Roetta Sessions, M.D.              DATE OF BIRTH:   DATE OF PROCEDURE:  10/08/2003  DATE OF DISCHARGE:                                 OPERATIVE REPORT   PROCEDURE:  Diagnostic colonoscopy.   ENDOSCOPIST:  Gerrit Friends. Rourk, M.D.   INDICATIONS FOR PROCEDURE:  The patient is a 44 year old gentleman with  alternating constipation, intermittent low volume rectal bleeding, positive  family history of colorectal cancer in his father who succumbed to the  disease at age 34. Colonoscopy is now being done.  This approach has been  discussed with the patient previously; and, again, today a the bedside.  The  potential risks, benefits, and alternatives have been reviewed; questions  answered.  Please see my dictated H&P 09/30/2003 for more information.   PROCEDURE NOTE:  O2 saturation, blood pressure, pulse and respirations were  monitored throughout the entire procedure.  Conscious sedation: Versed 3 mg IV, Demerol 75 mg IV in divided doses.   INSTRUMENT:  Olympus video chip system.   FINDINGS:  Digital rectal exam revealed engorged external hemorrhoids,  otherwise negative.   ENDOSCOPIC FINDINGS:  The prep was adequate.   RECTUM:  Examination of the rectal mucosa including the retroflex view of  the anal verge revealed no abnormalities.   COLON:  The colonic mucosa was surveyed from the rectosigmoid junction  through the left transverse and right colon to the area of the appendiceal  orifice, ileocecal valve, and cecum.  These structures were well seen and  photographed for the record.   From this level the scope was slowly withdrawn.  All previously mentioned  mucosal surfaces were again seen.  The colonic mucosa appeared normal.  The  patient tolerated the procedure well  and was reacted in endoscopy.   IMPRESSION:  1. External hemorrhoids otherwise normal rectum.  2. Normal colon.   DISCUSSION:  I suspect the patient has irritable bowel syndrome and has  bleed from hemorrhoids.   RECOMMENDATIONS:  1. Hemorrhoid literature provided to the patient.  2. He should start daily fiber supplement.  3. Ana-mantle cream applied to the anorectum q.i.d. p.r.n.  4. Recommend continued high-risk screening colonoscopy with repeat     examination in 5 years.      ___________________________________________                                            Jonathon Bellows, M.D.   RMR/MEDQ  D:  10/08/2003  T:  10/08/2003  Job:  [id number]   cc:   Kirk Ruths, M.D.  [address]  [place]  [place] [address]  Fax: [phone]

## 2010-10-20 NOTE — H&P (Signed)
NAME:  [name] [name], [name] [name]                  ACCOUNT NO.:  [id number]   MEDICAL RECORD NO.:  [id number]           PATIENT TYPE:  AMB   LOCATION:                                FACILITY:  [facility]   PHYSICIAN:  Dalia Heading, M.D.  DATE OF BIRTH:  1965/09/30   DATE OF ADMISSION:  DATE OF DISCHARGE:  LH                                HISTORY & PHYSICAL   CHIEF COMPLAINT:  Biliary colic secondary to chronic cholecystitis.   HISTORY OF PRESENT ILLNESS:  The patient is a 45 year old white male who was  referred for evaluation and treatment of biliary colic secondary to chronic  cholecystitis.  He has been having right upper quadrant abdominal pain with  radiation to the right flank, nausea and bloating for many months.  Does  have fatty food intolerance.  No fever, chills or jaundice have been noted.   PAST MEDICAL HISTORY:  ITP.   PAST SURGICAL HISTORY:  Splenectomy in 2003.   CURRENT MEDICATION:  Scopolamine patch for nausea.   ALLERGIES:  NO KNOWN DRUG ALLERGIES.   REVIEW OF SYSTEMS:  The patient smokes tobacco daily.  He drinks alcohol  minimally.  He does smoke marijuana occasionally for the nausea.   PHYSICAL EXAMINATION:  GENERAL:  The patient is a well-developed, well-  nourished white male in no acute distress.  HEENT:  No scleral icterus.  LUNGS:  Clear to auscultation with equal breath sounds bilaterally.  CARDIOVASCULAR:  Regular rate and rhythm without S3, S4 or murmurs.  ABDOMEN:  Soft, nondistended.  He is tender in the right upper quadrant to  palpation.  No hepatosplenomegaly, masses or hernias are identified.   Ultrasound of the gallbladder reveals no cholelithiasis, though there is a  question of a gallbladder polyp.  HIDA scan reveals chronic cholecystitis  with a low gallbladder ejection fraction and reproducible symptoms with a  fatty meal.   IMPRESSION:  Chronic cholecystitis.   PLAN:  The patient is scheduled for laparoscopic cholecystectomy on Oct 26, 2005.  Risks  and benefits of the procedure including bleeding, infection,  hepatobiliary injury and the possibility of an open procedure were fully  explained to the patient, gave informed consent.  He is on a scopolamine  patch in order to avoid using marijuana.  He will be given Zosyn  preoperatively due to his history of splenectomy.      Dalia Heading, M.D.  Electronically Signed     MAJ/MEDQ  D:  10/23/2005  T:  10/23/2005  Job:  [id number]   cc:   Patrica Duel, M.D.  Fax: [phone]

## 2010-10-20 NOTE — Op Note (Signed)
NAME:  Huang, Becker                  ACCOUNT NO.:  [id number]   MEDICAL RECORD NO.:  [id number]          PATIENT TYPE:  AMB   LOCATION:  DAY                           FACILITY:  [facility]   PHYSICIAN:  R. Roetta Sessions, M.D. DATE OF BIRTH:  Jun 27, 1965   DATE OF PROCEDURE:  08/02/2005  DATE OF DISCHARGE:                                 OPERATIVE REPORT   PROCEDURE:  Diagnostic esophagogastroduodenoscopy followed by colonoscopy  with snare polypectomy.   INDICATIONS FOR PROCEDURE:  The patient is a 45 year old gentleman with  nonspecific fleeting migratory abdominal pain. Recent ultrasound  demonstrated a 7-mm gallbladder polyp. Otherwise no significant findings (I  just reviewed the ultrasound with Dr. Cristal Ford). He has had  intermittent hematochezia as well but no diarrhea, no melena. Long-standing  gastroesophageal reflux disease symptoms and a positive family history of  colorectal cancer. EGD and colonoscopy are now being done. This approach has  been discussed with the patient at length. Potential risks, benefits, and  alternatives have been reviewed and questions answered. He is agreeable.  Please see documentation in the medical record.   PROCEDURE NOTE:  O2 saturation, blood pressure, pulse, and respirations were  monitored throughout the entire procedure. Conscious sedation with Versed 5  mg IV and Demerol 100 mg IV in divided doses.  Cetacaine spray for topical  oropharyngeal anesthesia.   INSTRUMENT:  Olympus video chip system.   FINDINGS:  Esophagogastroduodenoscopy:  Examination of the tubular esophagus  revealed four-quadrant distal esophageal erosions. The longest was  approximately 1 cm up from the EG junction. There was no evidence of  Barrett's esophagus or neoplasia. EG junction was easily traversed.   Stomach:  Gastric cavity was empty and insufflated well with air. Thorough  examination of gastric mucosa including retroflexed view of the proximal  stomach and  esophagogastric junction demonstrated only small hiatal hernia.  Pylorus patent and easily traversed. Examination of bulb and second portion  revealed no abnormalities.   THERAPEUTIC/DIAGNOSTIC MANEUVERS:  None.   The patient tolerated the procedure well and was prepared for colonoscopy.  Digital rectal revealed tight anal sphincter and two large external  hemorrhoid tags. Otherwise negative.   ENDOSCOPIC FINDINGS:  Prep was adequate.   Rectum:  Examination of the rectal mucosa including retroflexed view of the  anal verge revealed no abnormalities.   Colon:  Colonic mucosa was surveyed from the rectosigmoid junction through  the left, transverse, and right colon to the area of the appendiceal  orifice, ileocecal valve, and cecum. These structures were well seen and  photographed for the record. From this level, the scope was slowly  withdrawn, and all previously mentioned mucosal surfaces were again seen.  The patient was noted to have a 4-mm polyp on a stalk in the mid ascending  colon. The remainder of the colonic mucosa appeared normal. This polyp was  removed with cold-snare technique. The patient tolerated the procedures well  and was reactive to endoscopy.   IMPRESSION:  Esophagogastroduodenoscopy:  Distal esophageal erosion  consistent with erosive reflux esophagitis. Small hiatal hernia. Otherwise  normal stomach,  D1 and  D2.   Colonoscopy findings:  External hemorrhoidal tags. Normal rectum. Polyp on a  stalk mid ascending colon resected with cold snare. Otherwise normal colon.   RECOMMENDATIONS:  1.  Begin Zegerid 40 mg orally daily. Literature for gastroesophageal reflux      disease given to Mr. Markovitz.  2.  Hemorrhoid literature provided to Mr. Boniface. He should increase his fiber      intake. Anusol HC suppositories 1 per rectum at bedtime for 10 days.  3.  Follow up on pathology.  4.  He had multiple GI complaints in the office including urinary symptoms      and leg  twitching. I have recommended he follow with Dr. Regino Schultze in      regard to these complaints. We will plan to see this nice gentleman back      in the office in six to eight weeks and see how we are going from a GI      standpoint. As a separate issue, he has a 7-mm gallbladder polyp. He      does not have any symptoms consistent with gallbladder disease. I would      recommend that he have another ultrasound to look at the size of this      polyp in one year.      Jonathon Bellows, M.D.  Electronically Signed     RMR/MEDQ  D:  08/02/2005  T:  08/02/2005  Job:  [id number]   cc:   Kirk Ruths, M.D.  Fax: [phone]

## 2010-10-20 NOTE — Discharge Summary (Signed)
[facility]  Patient:    [name] [name], [name] [name] Visit Number: [id number] MRN: [id number]          Service Type: MED Location: 3A A325 01 Attending Physician:  Patrica Duel Dictated by:   Colette Ribas, M.D. Admit Date:  10/29/2001 Discharge Date: 10/30/2001                             Discharge Summary  HISTORY OF PRESENT ILLNESS:  Please see hospitalist admit H&P for details.  HOSPITAL COURSE:  On Oct 30, 2001, when I saw the patient the day after admission, he felt quite well.  Vital signs had remained stable.  He had already begun on Solu-Medrol 2 g due to his severe thrombocytopenia.  When I saw the patient, again the platelets were less than 5000, which had decreased. This was quite worrisome.  ANA and HIV were both pending.  We suspected ITP, and Dr. Mariel Sleet was to see the patient.  Dr. Mariel Sleet was out of town that weekend, and I discussed the case with the hematology/oncology group at [facility], and they were more than happy to accept the patient in transfer.  Both the patient, his family, and myself felt like this would be prudent as he could get a more thorough workup there at [facility].  He was stable for transfer the day after admission and was transferred on the 29th.  For further workup per [facility], please see discharge summary from there. Dictated by:   Colette Ribas, M.D. Attending Physician:  Patrica Duel DD:  12/24/01 TD:  12/25/01 Job: [id number]

## 2010-10-20 NOTE — H&P (Signed)
[facility]  Patient:    [name] [name], [name] [name]                         MRN: [id number] Adm. Date:  [id number] Attending:  Otilio Saber Dictator:   Johnella Moloney, NP                         History and Physical  IDENTIFYING INFORMATION:  Mr. Shakespeare is a 45 white separated male admitted on Jul 25, 2000 on a voluntary basis.  Chief complaint:  "I guess Im here for reality check, having marital problems, my wife attempted suicide November 2001, and I feel like I need help because of some of the things that I have done."  HISTORY OF PRESENT ILLNESS:  Apparently, his wife told him she was going to lead her own life because of patients infidelity.  Apparently, his wife would see him at his girlfriends house and actually listen to some voice mail and was very much aware that he was being unfaithful.  Recently, the wife told him that she was longer number one in his life.  Apparently, also his wife is talking to a male friend of his and she states he is a friend, someone to talk to.  The patient feels like his wife should talk to him.  Apparently, his wife has told him it is going to take time to get back together.  His wife is afraid of being hurt again.  Patient reports that he cannot accept what the wife is telling him.  Sleep has been poor for 1 week.  In fact, he states he has not slept in a week, and before that his sleep was restless.  Appetite is poor.  He has had a weight loss of 15 pounds in 6 days.  He just has not been able to eat.  No thoughts to kill himself, but then he has thoughts to let his wife know that things could happen out of his control, a possible wreck but he said there would be no intention of dying.  He was rather vague about this. He states he wants his back "back bad."   He admits to being depressed for 6 to 7 years and states it has been the guilt due to the affairs he has had and how he has hurt his wife.  Patient does smoke pot to relax  and control is nerves for stomach problems.  He states his depression has been worse over the last two weeks, has not been able to work and has missed about a week of work.  PAST PSYCHIATRIC HISTORY:  Patient denies any previous suicide attempts.  He has had no previous psychiatric treatment.  SUBSTANCE ABUSE HISTORY:  Patient drinks alcohol, maybe 1 drink a month.  He does use marijuana since the age of 17.  He smokes about 2 joints a day.  He did experiment with some drugs in is teenage years.  Last time was 44 years ago.  He smokes 1 pack of cigarettes a month.  PAST MEDICAL HISTORY:  Patients primary care doctor is Dr. Milus Glazier whom he last saw recently.  He also had a DOT physical by Dr. Kandy Garrison who is in the same practice at [facility] in [place].  Medical problems include:  Patient states he has had "blood in his stools" for years and he has been worked up by the [employer].D.  He did have a colonoscopy in November 2000 and everything has been negative.  Basically, they state all they can find is hemorrhoids.  Patient is somewhat obsessed about this because his father did die of colon cancer.  He states he has had arthritis in his legs, hands, and back.  He has a history of hypertension, states that that was in 1988, no longer has it.  Has asthma.  Also, he has had multiple somatic complaints, and has had MRI, CT scans since 1993, and nothing has been found.  Patient tends to have a lot of somatic complaints that apparently his doctors have worked up but have found no cause.  Current medications:  None.  Drug allergies:  No known drug allergies.  He did state that he was allergic to a PAIN PILL but he does not know the name, and it was about 44 years ago.  SOCIAL HISTORY:  Patient has been married for 13 years.  He has been separated from his wife since August 2001.  Patient has a long history of infidelity in his past and apparently this is why his wife left him.  He has  one son age 12. His father is deceased and died of colon cancer.  His mother is living. Patient is currently living in [place] in a mobile home.  He has 1 brother and 1 sister.  He admits to having problems controlling his temper as a teenager, but states he is better able to control it now.  He completed the 44th grade.  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  He states his job is stressful but he does like his job.  He does have severe financial problems currently due to the separation from his wife. He states his wife did attempt suicide November 2001 due to all her problems. He feels like his wife is going to come back to him and that he cannot be without her.  He is just upset that his wife is having a difficult time learning to trust him.  Some question that patient has some type of sexual addiction.  FAMILY HISTORY:  None known.  POSITIVE PHYSICAL FINDINGS:  Vital signs:  Temperature 97.7, pulse 107, respirations 24, blood pressure 149/92.  Height 5 feet 8 inches, weight 255 pounds.  REVIEW OF SYSTEMS:  Patient reports no major surgeries or major illnesses. CARDIAC:  Denies any problems.  He does have a history of hypertension back in 1988.  On admission his blood pressure was 149/92 so we will need to watch this.  Denies any chest pain.  PULMONARY: Denies any problems.  He does about a pack of cigarettes a month.  No cough.  No recent upper respiratory infection.  He does however have a history of asthma.  He is currently on no medication for this.  NEURO:  He denies any problems.  No headaches, no history of seizures, no history of falling, no history of stroke, no confusion, no dizziness.  HEMATOLOGY:  Patient states that he has had blood in his stools in the past; however this has been worked up by his medical doctor. He had a colonoscopy November 2000 that was negative.  He seems somewhat ambivalent that there might be something wrong.   His dad did have colon cancer and I think this is part of the stress.  ENDOCRINE:  Denies any problems.  No diabetes, no thyroid problems.  GI: Patient does report some stomach problems;  has a history of GERD, occasionally has diarrhea or constipation.  GU: Denies any problems.  No urinary frequency, urgency, incontinence, hematuria, or nocturia.  MUSCULOSKELETAL:  Denies any problems. No joint pain, no joint swelling; however, the patient does tell me that he has arthritis in his legs, hands, and back.  ENT:  Patient denies any problems.  REPRODUCTIVE:  States he is sexually active, not currently.  PREVENTIVE CARE:  He did have a DOT exam January 2002.  SKIN/MUCOSA:  Denies any problems, no rash, ecchymosis, erythema or edema noted.  PAIN:  He denies any pain.  SLEEP:  Patient has had difficulty sleeping for one week and states he is not sleeping hardly at all. NUTRITION:  Patient states he has not eaten for about a week and has lost probably 15 pounds in the last week.  PHYSICAL EXAMINATION:  GENERAL APPEARANCE:  Mr. Cancro is a 45 year old Caucasian male sitting in a chair in no acute distress.  He is well-developed and obese in stature and he appears his stated age.  HEAD:  Normocephalic, atraumatic.  He can raise his eyebrows.  Eyes: Pupils PERRLA.  EOM intact bilaterally, direct and consensual.  Funduscopic exam within normal limits.  ENT/MOUTH:  External ear canals patent.  Tympanic membranes intact with normal cone of light.  Nostrils patent bilaterally.  He does have a septal deviation to the left.  His turbinate appears a little bit swollen in his right nose and he states that he has had some nosebleeds recently.  Mucosa moist, fair dentition.  No lesions seen or palpated on tongue.  Tongue protrudes midline without tremor.  He can clench his teeth and puff out cheeks.  No pharyngeal or tonsillar hyperemia exudate noted.  NECK:  Supple, with full range of motion.  No JVD, no  lymphadenopathy. Thyroid is nonpalpable, nontender, not enlarged.  RESPIRATORY:  Clear to auscultation without adventitious sounds.  CARDIOVASCULAR:  Regular rate and rhythm without murmurs.  Carotid pulses equal and adequate bilaterally.  No carotid bruits auscultated.  No edema or varicosities noted.  Pedal pulses are equal and adequate.  CHEST:  Breasts are symmetrical.  ABDOMEN:  Inspection reveals protrubrant, soft, nontender abdomen.  No masses or organomegaly or rebound tenderness.  Active bowel sounds in all 4 quadrants.  No CVA tenderness.  LYMPH:  No lymphadenopathy.  MUSCULAR:  No joint swelling or deformity.  Gait is normal.  He has got good range of motion in his hands, wrists, elbows, shoulders, spine, hips, ankles and knees.  Muscle strength and tone is equal bilaterally.  There are no signs of injuries.  SKIN:  Warm and dry.  Good turgor.  Nail beds pink with good capillary refills.  Otherwise no abnormalities noted.  NEURO:  He is oriented x 3.  His cranial nerves are grossly intact.  Deep tendon reflexes are 2+ equal and adequate, upper and lower extremities.  Good grip strength bilaterally.  There is no involuntary movement.  Gait is normal. Cerebellar function intact with finger to finger, heel to shin, and normal alternating movements, normal gait.  Romberg is negative.  MENTAL STATUS EXAMINATION:  Appearance:  Slightly overweight Caucasian male who is casually dressed.  He is cooperative.  He is extremely tremulous, particularly in his upper extremities and even in is upper trunk.  Speech is normal tone and relevant.  Mood sad, anxious.  Affect is depressed and anxious.  He does have some passive suicidal ideation, although he is rather vague about this.  No homicidal ideation or intent.  Thought processes were logical and coherent without evidence of psychosis.  Cognitive, alert and oriented, cognitive function is intact.  He is of average  intelligence. Judgment is fair.  Insight is fair.  Impulse control is fair.  ADMISSION DIAGNOSES: Axis I:    1. Major depression, single episode, with some suicidal ideation.            2. Anxiety disorder not otherwise specified. Axis II:   Deferred. Axis III:  1. Asthma.            2. Hypertension.            3. Arthritis.            4. Hemorrhoids. Axis IV:   Severe, related to problems with primary support group, social            environment, economic problems and separation from his wife. Axis V:    Current global assessment of functioning 40, highest past year 75.  INITIAL PLAN OF CARE:  Voluntary admission to [facility] unit.  We will check him every 15 minutes, maintain safety.  He was placed on Ambien 10 mg p.o. h.s. p.r.n. for sleep, and also Klonopin 0.5 mg t.i.d. p.r.n. for anxiety.  The Klonopin does not appear to be controlling him, so we will add Seroquel 25 mg p.o. q.6h. p.o. p.r.n. for tremors or severe anxiety. Patient does not want to be on an antidepressant that causes sexual dysfunction.  We decided to try to Wellbutrin SR 100 mg p.o. stat and then 1 p.o. q.a.m.  Risks and benefits were explained and we will see if this patient can tolerate this medication.  Patient is to attend groups.  TENTATIVE LENGTH OF STAY AND DISCHARGE PLAN:  Three days.  Patient is sponsored by [facility]. DD:  07/26/00 TD:  07/26/00 Job: [id number]

## 2010-10-20 NOTE — Op Note (Signed)
NAME:  [name], [name]                  ACCOUNT NO.:  [id number]   MEDICAL RECORD NO.:  [id number]          PATIENT TYPE:  AMB   LOCATION:  [facility]SC                          FACILITY:  [facility]   PHYSICIAN:  Rodney A. Mortenson, M.D.DATE OF BIRTH:  December 20, 1965   DATE OF PROCEDURE:  02/07/2006  DATE OF DISCHARGE:                                 OPERATIVE REPORT   JUSTIFICATION:  A 45 year old male injured his left knee a number of years  ago.  He has had intermittent trouble with his knee for several years.  Several days ago while coming out from underneath the house, something  popped in his knee.  It was very painful.  He was unable to flex or extend  the knee.  The knee was locked in position.  He had swelling in the area.  He is now walking with extreme pain.  Examination of the knee showed  fullness about the knee and acute tenderness along the medial joint line.  An MRI was done and this shows tears of both the medial and lateral meniscus  and ruptured Baker cyst on the left.  He is now admitted for surgical  repair.  Complications were discussed preoperatively.  Questions answered,  encouraged extensively.   PREOPERATIVE DIAGNOSIS:  Tear of medial and lateral meniscus, left knee.   POSTOPERATIVE DIAGNOSIS:  Displaced buckle handle tear, posterior two-  thirds, medial meniscus, left knee.   OPERATION:  Subtotal medial meniscectomy.   SURGEON:  Lenard Galloway. Chaney Malling, M.D.   ANESTHESIA:  General.   PATHOLOGY:  With the arthroscope in the knee, very careful examination was  undertaken.  The patellofemoral joint  was visualized first and it was  absolutely normal.  The anterior cruciate ligament was normal.  In the  lateral compartment, there is normal articular cartilage with lateral  femoral condyle and lateral tibial plateau, and the entire circumference of  the lateral meniscus was examined very carefully, palpated on the dorsal and  ventral surfaces with a probe, and there were  absolutely no tears or  deformity noted.  The arthroscope was then placed in the medial compartment.  There was normal articular cartilage of the medial femoral condyle and  medial tibial plateau, but he had a huge displaced bucket handle tear which  involved the posterior two-thirds of the medial meniscus.   PROCEDURE:  The patient was placed on the operating table in a supine  position with the pneumatic tourniquet above left upper thigh.  The left leg  was placed in a leg holder.  The entire left lower extremity was prepped  with DuraPrep and draped out in the usual manner.  An infusion cannula was  placed in the superior medial pouch.  The anteromedial and anterolateral  portals were then made and the arthroscope was introduced.  The only  significant pathology seen in the medial compartment was where there was a  markedly displaced bucket handle tear which involved the posterior two  thirds.  The posterior attachment was released with intra-articular  scissors.  The anterior extent of the tear was then incised with  a banana  blade and part of the meniscus was removed with the pituitary.  A series of  baskets were inserted and remaining torn portion of the medial meniscus was  debrided and the intra-articular shaver was introduced.  The remaining rim  was then smoothed and balanced in nice transition from the posterior  attachment all the way to the junction of the anterior and middle thirds.  Complete decompression of this area was achieved very nicely.  The anterior  one third of the meniscus appeared normal.  Excellent decompression was  achieved.  I was very satisfied with the surgical outcome.   Marcaine was then placed in the knee and a large, bulky pressure dressing  was applied.  The patient returned to the recovery room in excellent  condition.  Technically this went extremely well.   FOLLOWUP CARE:  1. To my office next week.  2. Percocet for pain.            ______________________________  Lenard Galloway. Chaney Malling, M.D.     RAM/MEDQ  D:  02/07/2006  T:  02/07/2006  Job:  [id number]

## 2010-10-20 NOTE — H&P (Signed)
NAME:  [name], [name]                  ACCOUNT NO.:  [id number]   MEDICAL RECORD NO.:  [id number]           PATIENT TYPE:  AMB   LOCATION:                                FACILITY:  [facility]   PHYSICIAN:  R. Roetta Sessions, M.D. DATE OF BIRTH:  11/24/1965   DATE OF ADMISSION:  DATE OF DISCHARGE:  LH                                HISTORY & PHYSICAL   CHIEF COMPLAINT:  Abdominal pain, hematochezia.   HISTORY OF PRESENT ILLNESS:  [name] [name] is a 45 year old Caucasian male  sent over through the courtesy of Dr. Regino Schultze to further evaluate abdominal  pain and recent rectal bleeding.  He tells me over the past 2 to 3 months he  has had very fleeting, nondescript, migratory abdominal pain which may go to  the right upper quadrant, left upper quadrant, radiating into his back.  It  may start in the lower abdomen below the umbilicus and be localized there.  This has not really been related to bowel function or eating.  He has not  had any associated fever or chills.  He has had some significant symptoms of  urinary hesitancy and nocturia.  He was seen in early January this year by  Dr. Rosalia Hammers in the ED.  He was evaluated fairly extensively with CT of abdomen,  urinalysis, and labs, all of which came back unremarkable.  Specifically,  heme negative.  Chem-20 looked entirely normal as did his CBC.  He was at  that time told he had prostatitis and given some type of sulfa antibiotic  as he describes.  More recently, July 13, 2005, he underwent an  ultrasound of the abdomen for left-sided abdominal pain.  He was found to  have a small gallbladder polyp.  Prior splenectomy noted.  He really  downplays any gastroesophageal reflux disease symptoms.  He takes an  occasional Pepcid.  He does not have any odynophagia, dysphagia.  He has had  no hematemesis or melena.  He does  not use any nonsteroidals on a regular  basis.  He has gained 22 pounds since his September 30, 2003, office visit here.   He  occasionally consumes a beer but does not drink on a regular basis.  He  smokes approximately 7 cigarettes a day.   FAMILY HISTORY:  Most significant in that his father succumbed to colon  cancer at age 13.   [name] [name] did have a colonoscopy by me for low-volume hematochezia back on Oct 08, 2003.  He was found to have external hemorrhoids, otherwise normal rectum  and colon.  He was slated to have a colonoscopy in 2010.   PAST MEDICAL HISTORY:  Notable for:  1.  Asthma.  2.  ITP for which he ultimately had a splenectomy over at [facility].   MEDICATIONS:  1.  Hydrocodone 650 mg twice daily p.r.n. pain.  2.  Flexeril 1 tablet every 4 to 6 hours.   ALLERGIES:  No known drug allergies.   SOCIAL HISTORY:  The patient is married.  He has four children.  He is  employed with the [employer].   REVIEW OF SYSTEMS:  No chest pain, dyspnea on exertion.  No fever or chills.  He has gained significant weight in the past 2 years.  Otherwise as in  History of Present Illness.   PHYSICAL EXAMINATION:  GENERAL:  [name]  resting comfortably.  VITAL SIGNS:  Weight 267.  Height 5 feet 7 inches.  Temperature 97.9, blood  pressure 146/96, pulse 92.  SKIN:  Warm and dry.  He does have a couple of spiders on his face.  HEENT:  There is no scleral icterus.  Conjunctivae are pink.  Oral cavity  without lesions.  NECK:  JVD is not prominent.  CHEST:  Lungs are clear to auscultation.  CARDIAC:  Regular rate and rhythm without murmur, gallop, or rub.  ABDOMEN: Obese, positive bowel sounds, soft, entirely nontender to  palpation.  No appreciable mass or organomegaly.  BACK: No CVA tenderness.  EXTREMITIES:  Trace lower extremity edema.  RECTAL: Deferred to time of colonoscopy.   IMPRESSION:  [name] [name] is a 45 year old [name] with very  nonspecific, fleeting, migratory abdominal pain.  He does have some symptoms  of urinary  hesitancy not mentioned above.  He also has significant problems  with leg twitching day and night, concerning for restless leg syndrome. It  sounds like he has some urological issues with his urinary symptoms.  His  abdominal pain is very vague.  He does have occasional gastroesophageal  reflux disease.   He describes rectal bleeding and has positive family history of colon cancer  in a first degree relative at a young age.  It is good to know he had a  colonoscopy back in 2005.  However, the etiology of all of his  gastrointestinal symptoms are not well defined at this time.   He has a small gallbladder polyp, size not quantified.  Certainly polyps,  when they get above 1 cm in the gallbladder, there does come along increased  risk of gallbladder carcinoma.  I will need to review the ultrasound with  the radiologist.  His symptoms in no way sound like typical gallbladder  disease at this time.   RECOMMENDATIONS:  1.  EGD and colonoscopy in the near future.  Potential risks, benefits,      alternatives have been reviewed.  2.  We will consider a HIDA scan.  3.  Depending on findings of the EGD and colonoscopy, further      recommendations to follow.  4.  As far as his urinary symptoms and his leg twitching/cramps are      concerned, I have recommended he follow up with Dr. Regino Schultze.      Jonathon Bellows, M.D.  Electronically Signed     RMR/MEDQ  D:  07/30/2005  T:  07/30/2005  Job:  [id number]   cc:   Kirk Ruths, M.D.  Fax: [phone]

## 2010-10-20 NOTE — Discharge Summary (Signed)
[facility]  Patient:    [name] [name], [name] [name]                         MRN: [id number] Adm. Date:  [id number] Disc. Date: [id number] Attending:  Otilio Saber Dictator:   Candi Leash. Orsini, N.P.                           Discharge Summary  IDENTIFYING INFORMATION:  This is a 45 year old white separated male admitted on a voluntary basis.  The patient reports that he is here for "a reality check."  Apparently, the patient has been having some extramarital affairs, and his wife is apparently going to be leaving the relationship.  He reports his sleep has been poor.  His appetite has been decreased.  He has had a weight loss of about 15 pounds.  The patient has presented with no suicidal ideation, but there seems to be some issues about some vague suicidal thoughts.  He does have some depressive symptoms that have been present for about the last two weeks.  Has had no previous suicide attempts, no psychiatric treatment.  PAST MEDICAL HISTORY:  Primary care Braylea Brancato is Dr. Nobie Putnam.  Past Medical History:  The patient has had blood in his stool with a follow-up colonoscopy, history of hypertension, and asthma.  ADMISSION MEDICATIONS:  None.  DRUG ALLERGIES:  No known drug allergies.  PHYSICAL EXAMINATION:  GENERAL:  The patient has no significant findings.  VITAL SIGNS:  His vital signs are within normal limits.  MENTAL STATUS EXAMINATION:  The patient is a slightly overweight Caucasian male, casually dressed, cooperative, and extremely tremulous particularly in his upper extremities and even in his upper trunk.  Speech is normal tone and relevant.  Mood is sad and anxious.  Affect is depressed and anxious.  He does have some passive suicidal ideation, although he is rather vague.  No homicidal ideation or intent.  Thought processes are logical and coherent without evidence of psychosis.  Cognitive:  He is alert and oriented. Cognitive function is intact.   He is of average intelligence.  Judgment is fair.  Insight is fair.  Impulse control is fair.  ADMISSION DIAGNOSES: Axis I:    1. Major depression, single episode with some suicidal ideation.            2. Anxiety disorder, not otherwise specified. Axis II:   Deferred. Axis III:  1. Asthma.            2. Hypertension.            3. Arthritis.            4. Hemorrhoids. Axis IV:   Severe relating to primary support group, social and economic            problems, and separation from his wife. Axis V:    Current Global Assessment of Functioning is 40; this past year 75.  HOSPITAL COURSE:  This is a voluntary admission to [facility].  He will be monitored q. 15 minutes.  Ambien will be available for sleep, and Klonopin will be ordered for his anxiety.  A family session was ordered.  The patient reported that he was feeling very preoccupied with his sense of loss and unable to accept the separation from his wife.  He appears to have low insight.  The patient was denying any active suicidal or homicidal thoughts. The patient  was started on Wellbutrin and Seroquel for his anxiety.  CONDITION UPON DISCHARGE:  The patient was feeling much better.  He said he was optimistic about his relationship with his wife and feeling that he was ready to go home.  He was denying any suicidal or homicidal or thoughts of hurting anyone else.  He was sleeping better, appetite was good, he was less anxious, and appeared calm.  He was tolerating his medications well, having mild elevated blood pressures.  DISPOSITION:  The patient was to be discharged to home.  DISCHARGE FOLLOWUP:  To follow up at the [facility], and appointment and phone number were provided.  The patient was to see Yvonna Alanis.  He was to be out of work until August 05, 2000.  DISCHARGE INSTRUCTIONS:  Low-salt diet.  DISCHARGE MEDICATIONS: 1. Celexa 20 mg one tab q.a.m. 2. Wellbutrin 100 mg SR one tab q.a.m. and q.  noon. 3. Klonopin 0.5 mg one tab q.h.s. p.r.n.  DISCHARGE DIAGNOSES: Axis I:    1. Major depression, single episode with some suicide ideation.            2. Anxiety disorder, not otherwise specified. Axis II:   Deferred. Axis III:  1. Asthma.            2. Hypertension.            3. Arthritis.            4. Hemorrhoids. Axis IV:   Severe. Axis V:    Current Global Assessment of Functioning is 60; this past year 75. DD:  08/22/00 TD:  08/23/00 Job: [id number]

## 2010-10-20 NOTE — Op Note (Signed)
NAME:  [name] [name], [name] [name]                  ACCOUNT NO.:  [id number]   MEDICAL RECORD NO.:  [id number]          PATIENT TYPE:  AMB   LOCATION:  DAY                           FACILITY:  [facility]   PHYSICIAN:  Dennie Maizes, M.D.   DATE OF BIRTH:  11-23-1965   DATE OF PROCEDURE:  11/26/2005  DATE OF DISCHARGE:                                 OPERATIVE REPORT   PREOPERATIVE DIAGNOSES:  1.  Voiding difficulty.  2.  Urinary frequency.  3.  Urgency.   POSTOPERATIVE DIAGNOSES:  1.  Voiding difficulty.  2.  Urinary frequency.  3.  Urgency.   OPERATIVE PROCEDURE:  Cystoscopy.   ANESTHESIA:  General.   SURGEON:  Dennie Maizes, M.D.   COMPLICATIONS:  None.   INDICATIONS FOR PROCEDURE:  This 45 year old male had a past history of  urethral catheterization.  He had voiding difficulty, urinary frequency and  urgency.  He was taken to the OR today for cystoscopy, possible urethral  dilation or possible visual urethrotomy under anesthesia.   DESCRIPTION OF PROCEDURE:  General anesthesia was induced and the patient  was placed on the OR table in the dorsolithotomy position.  The lower  abdomen and genitalia were prepped and draped in a sterile fashion.  Cystoscopy was done with the 22-French scope under direct vision.  The  urethra was normal.  There is no evidence  of any urethral stricture.  The prostate was normal.  The bladder was then  examined and found to be mildly trabeculated.  No abnormality was noted  inside the bladder and the bladder capacity was adequate.  Instruments were  removed.  The patient was transferred to the PACU in satisfactory condition.      Dennie Maizes, M.D.  Electronically Signed     SK/MEDQ  D:  11/26/2005  T:  11/26/2005  Job:  [id number]   cc:   Patrica Duel, M.D.  Fax: [phone]

## 2010-10-20 NOTE — H&P (Signed)
NAME:  [name] [name], [name] [name]                  ACCOUNT NO.:  [id number]   MEDICAL RECORD NO.:  [id number]          PATIENT TYPE:  AMB   LOCATION:  DAY                           FACILITY:  [facility]   PHYSICIAN:  Dennie Maizes, M.D.   DATE OF BIRTH:  [birth year]   DATE OF ADMISSION:  11/26/2005  DATE OF DISCHARGE:  LH                                HISTORY & PHYSICAL   CHIEF COMPLAINT:  Voiding difficulty, urinary frequency, urgency.   HISTORY OF PRESENT ILLNESS:  This 45 year old male was referred to me by Dr.  Nobie Putnam for evaluation  and management of his urinary symptoms. He had been  having trouble with some urinary frequency and nocturia. He gets up every  hour during the night and he has to go several times during the day to pass  urine. He also has urinary urgency and urge of incontinence. The patient has  to strain to empty the bladder. The urinary stream is interrupted and slow.  He has feeling of incomplete emptying of the bladder. He denies fever,  chills, gross hematuria, dysuria. There is no past history of urolithiasis  or urinary tract infections. He has occasional numbness of the penis.   PAST MEDICAL HISTORY:  History of idiopathic thrombocytopenia. Status post  splenectomy in September 2003. Status post cholecystectomy in May 2007.  History of hemorrhoids. The patient has a past history of urethral  catheterization.   MEDICATIONS:  None.   ALLERGIES:  None.   PHYSICAL EXAMINATION:  HEENT:  Normal.  NECK:  No masses.  LUNGS:  Clear to auscultation.  HEART:  Regular rate and rhythm. No murmurs.  ABDOMEN:  Soft. No palpable flank mass or CVA tenderness.  GENITOURINARY:  Bladder no palpable. Penis and testes are normal.  RECTAL:  40 grams and benign prostate.   LABORATORY DATA:  Bladder ultrasound revealed a post voided urine of 38 cc.  Urinary flow study revealed a peak flow rate of 30 ml per second and the  mean flow rate of 8 ml per second.   IMPRESSION:  Voiding  difficulty, rule out urethral stricture, urinary  frequency, urgency.   PLAN:  Cystoscopy, possible urethral dilation, possible visual  urethrotomy  and anesthesia in the short stay center. I discussed  with the patient  regarding the diagnoses, operative details, alternate treatments, outcome,  possible risks, and complications and he has agreed for the procedure to be  done.      Dennie Maizes, M.D.  Electronically Signed     SK/MEDQ  D:  11/25/2005  T:  11/25/2005  Job:  [id number]   cc:   Patrica Duel, M.D.  Fax: [phone]

## 2010-10-20 NOTE — Op Note (Signed)
NAME:  [name] [name], [name] [name]                  ACCOUNT NO.:  [id number]   MEDICAL RECORD NO.:  [id number]          PATIENT TYPE:  AMB   LOCATION:  DAY                           FACILITY:  [facility]   PHYSICIAN:  Dalia Heading, M.D.  DATE OF BIRTH:  09-22-1965   DATE OF PROCEDURE:  10/26/2005  DATE OF DISCHARGE:                                 OPERATIVE REPORT   PREOPERATIVE DIAGNOSIS:  Chronic cholecystitis.   POSTOPERATIVE DIAGNOSIS:  Chronic cholecystitis.   PROCEDURE:  Laparoscopic cholecystectomy.   SURGEON:  Dalia Heading, M.D.   ASSISTANT:  Bernerd Limbo. Leona Carry, M.D.   ANESTHESIA:  General endotracheal.   INDICATIONS:  The patient is a 45 year old white male who presents with  chronic cholecystitis.  The risks and benefits of the procedure including  bleeding, infection, hepatobiliary, and the possibility of an open procedure  were fully explained to the patient who gave his informed consent.   PROCEDURE NOTE:  The patient was placed in the supine position.  After the  induction of general endotracheal anesthesia, the abdomen was prepped and  draped in the usual sterile technique with Betadine.  Surgical site  confirmation was performed.   A supraumbilical incision was made down to the fascia.  The Veress needle  was introduced in the abdominal cavity and confirmation of placement was  done using the saline drop test.  The abdomen was then insufflated to 16  mmHg pressure.  An 11 mm trocar was introduced into the abdominal cavity  under direct visualization without difficulty.  The patient was placed in  reversed Trendelenburg position.  An additional 11 mm trocar was placed in  the epigastric region.  5 mm trocars were placed in the right upper quadrant  and right flank regions.  The liver was inspected and noted to be within  normal limits.  The gallbladder was retracted superiorly and  laterally.  The dissection was begun around the infundibulum of the gallbladder.  The  cystic  duct first identified.  Its juncture to the infundibulum was fully  identified.  An endoclip was placed proximally and distally on the cystic  duct and the cystic duct was divided.  This was, likewise, done on the  cystic artery.  The gallbladder then freed away from the gallbladder fossa  using Bovie electrocautery.  The gallbladder was delivered through the  epigastric trocar site using an EndoCatch bag.  The gallbladder fossa was  inspected and no abnormal bleeding or bile leakage was noted.  Surgicel was  placed in the gallbladder fossa.  All fluid and air were then evacuated from  the abdominal cavity prior to move the trocars.  All wounds were irrigated  with normal saline.  All wounds were checked with 0.5% Sensorcaine.  The  supraumbilical fascia was reapproximated using an 0 Vicryl interrupted  suture.  All skin incisions were closed using staples.  Betadine ointment  and dry sterile dressings were applied.  All tape and needle counts were  correct at the end of the procedure.  The patient was extubated in the  operating room and  went back to the recovery room awake and in stable  condition.   COMPLICATIONS:  None.   SPECIMEN:  Gallbladder.   BLOOD LOSS:  Minimal.      Dalia Heading, M.D.  Electronically Signed     MAJ/MEDQ  D:  10/26/2005  T:  10/26/2005  Job:  [id number]   cc:   Patrica Duel, M.D.  Fax: [phone]

## 2010-12-17 ENCOUNTER — Emergency Department (HOSPITAL_COMMUNITY): Payer: BC Managed Care – PPO

## 2010-12-17 ENCOUNTER — Other Ambulatory Visit: Payer: Self-pay

## 2010-12-17 ENCOUNTER — Emergency Department (HOSPITAL_COMMUNITY)
Admission: EM | Admit: 2010-12-17 | Discharge: 2010-12-17 | Disposition: A | Discharge status: Home / Self Care | Payer: BC Managed Care – PPO | Attending: Emergency Medicine | Admitting: Emergency Medicine

## 2010-12-17 DIAGNOSIS — R079 Chest pain, unspecified: Secondary | ICD-10-CM | POA: Insufficient documentation

## 2010-12-17 DIAGNOSIS — R110 Nausea: Secondary | ICD-10-CM | POA: Insufficient documentation

## 2010-12-17 DIAGNOSIS — M549 Dorsalgia, unspecified: Secondary | ICD-10-CM | POA: Insufficient documentation

## 2010-12-17 DIAGNOSIS — Z79899 Other long term (current) drug therapy: Secondary | ICD-10-CM | POA: Insufficient documentation

## 2010-12-17 DIAGNOSIS — N12 Tubulo-interstitial nephritis, not specified as acute or chronic: Secondary | ICD-10-CM

## 2010-12-17 DIAGNOSIS — E079 Disorder of thyroid, unspecified: Secondary | ICD-10-CM | POA: Insufficient documentation

## 2010-12-17 DIAGNOSIS — R51 Headache: Secondary | ICD-10-CM | POA: Insufficient documentation

## 2010-12-17 DIAGNOSIS — R509 Fever, unspecified: Secondary | ICD-10-CM | POA: Insufficient documentation

## 2010-12-17 DIAGNOSIS — K219 Gastro-esophageal reflux disease without esophagitis: Secondary | ICD-10-CM | POA: Insufficient documentation

## 2010-12-17 HISTORY — DX: Disorder of thyroid, unspecified: E07.9

## 2010-12-17 HISTORY — DX: Gastro-esophageal reflux disease without esophagitis: K21.9

## 2010-12-17 HISTORY — DX: Sleep apnea, unspecified: G47.30

## 2010-12-17 LAB — COMPREHENSIVE METABOLIC PANEL
ALT: 19 U/L (ref 0–53)
AST: 19 U/L (ref 0–37)
Alkaline Phosphatase: 72 U/L (ref 39–117)
GFR calc Af Amer: >60 mL/min (ref >60)
Glucose, Bld: 90 mg/dL (ref 70–99)
Potassium: 3.7 mEq/L (ref 3.5–5.1)
Sodium: 136 mEq/L (ref 135–145)
Total Protein: 7.7 g/dL (ref 6.0–8.3)

## 2010-12-17 LAB — DIFFERENTIAL
Basophils Absolute: 0 10*3/uL (ref 0.0–0.1)
Eosinophils Absolute: 0 10*3/uL (ref 0.0–0.7)
Lymphocytes Relative: 7 % — ABNORMAL LOW (ref 12–46)
Lymphs Abs: 1.6 10*3/uL (ref 0.7–4.0)
Neutrophils Relative %: 80 % — ABNORMAL HIGH (ref 43–77)

## 2010-12-17 LAB — URINE MICROSCOPIC-ADD ON

## 2010-12-17 LAB — URINALYSIS, ROUTINE W REFLEX MICROSCOPIC
Bilirubin Urine: NEGATIVE
Glucose, UA: NEGATIVE mg/dL
Specific Gravity, Urine: 1.02 (ref 1.005–1.030)
Urobilinogen, UA: 0.2 mg/dL (ref 0.0–1.0)
pH: 7.5 (ref 5.0–8.0)

## 2010-12-17 LAB — CBC
Platelets: 385 10*3/uL (ref 150–400)
RBC: 5.28 MIL/uL (ref 4.22–5.81)
WBC: 23.9 10*3/uL — ABNORMAL HIGH (ref 4.0–10.5)

## 2010-12-17 LAB — TROPONIN I: Troponin I: <0.3 ng/mL (ref <0.30)

## 2010-12-17 MED ORDER — METOCLOPRAMIDE HCL 10 MG PO TABS: 10.0000 mg | ORAL_TABLET | Freq: Four times a day (QID) | ORAL | Status: AC

## 2010-12-17 MED ORDER — SODIUM CHLORIDE 0.9 % IV SOLN
INTRAVENOUS | Status: DC
  Administered 2010-12-17: 19:00:00 via INTRAVENOUS

## 2010-12-17 MED ORDER — MORPHINE SULFATE 4 MG/ML IJ SOLN
6.0000 mg | Freq: Once | INTRAMUSCULAR | Status: AC
  Administered 2010-12-17: 6 mg via INTRAVENOUS
  Filled 2010-12-17: qty 2

## 2010-12-17 MED ORDER — METOCLOPRAMIDE HCL 10 MG PO TABS
ORAL_TABLET | ORAL | Status: AC
  Administered 2010-12-17: 22:00:00
  Filled 2010-12-17: qty 2

## 2010-12-17 MED ORDER — SODIUM CHLORIDE 0.9 % IV BOLUS (SEPSIS)
1000.0000 mL | Freq: Once | INTRAVENOUS | Status: AC
  Administered 2010-12-17: 1000 mL via INTRAVENOUS

## 2010-12-17 MED ORDER — OXYCODONE-ACETAMINOPHEN 5-325 MG PO TABS: 2.0000 | ORAL_TABLET | ORAL | Status: AC | PRN

## 2010-12-17 MED ORDER — IOHEXOL 300 MG/ML  SOLN
100.0000 mL | Freq: Once | INTRAMUSCULAR | Status: AC | PRN
  Administered 2010-12-17: 100 mL via INTRAVENOUS

## 2010-12-17 MED ORDER — DEXTROSE 5 % IV SOLN
1.0000 g | INTRAVENOUS | Status: DC
  Administered 2010-12-17: 1 g via INTRAVENOUS
  Filled 2010-12-17: qty 1

## 2010-12-17 MED ORDER — CEPHALEXIN 500 MG PO CAPS: ORAL_CAPSULE | ORAL | Status: DC

## 2010-12-17 MED ORDER — METOCLOPRAMIDE HCL 5 MG/ML IJ SOLN
10.0000 mg | Freq: Once | INTRAMUSCULAR | Status: AC
  Administered 2010-12-17: 10 mg via INTRAVENOUS
  Filled 2010-12-17: qty 2

## 2010-12-17 MED ORDER — SODIUM CHLORIDE 0.9 % IJ SOLN
INTRAMUSCULAR | Status: AC
  Administered 2010-12-17: 19:00:00
  Filled 2010-12-17: qty 10

## 2010-12-17 NOTE — ED Notes (Signed)
Resting quietly in bed. Denies pain or SOB. SR on monitor. NAD noted

## 2010-12-17 NOTE — ED Notes (Signed)
Percocet take home pack pulled from Pyxxis per Dr. Fonnie Jarvis order. Pt instructed to take 2 tabs by mouth every 4 hours as needed for pain. 6 tabs dispensed to pt in ED

## 2010-12-17 NOTE — ED Notes (Signed)
Patient is resting comfortably. 

## 2010-12-17 NOTE — ED Provider Notes (Signed)
History   One week gradual onset headache, no focal neuro Sxs, right sided back/chest/abd pain 24/7, has diffuse chronic abd pain for years, also one week occasional left side chest pains last few minutes at a time not related to exertion, fever to 101 today with general weakness.  Chief Complaint  Patient presents with  . Chest Pain   Patient is a 45 y.o. male presenting with general illness.  Illness  Associated symptoms include a fever, abdominal pain, nausea and headaches. Pertinent negatives include no diarrhea, no vomiting, no rhinorrhea, no cough, no rash, no eye discharge and no eye redness.    Past Medical History  Diagnosis Date  . Sleep apnea   . GERD (gastroesophageal reflux disease)   . Thyroid disease     Past Surgical History  Procedure Date  . Splenectomy   . Cholecystectomy     History reviewed. No pertinent family history.  History  Substance Use Topics  . Smoking status: Never Smoker   . Smokeless tobacco: Not on file  . Alcohol Use: No      Review of Systems  Constitutional: Positive for fever and fatigue.       10 Systems reviewed and are negative for acute change except as noted in the HPI.  HENT: Negative for rhinorrhea.   Eyes: Negative for discharge and redness.  Respiratory: Negative for cough and shortness of breath.   Cardiovascular: Positive for chest pain.  Gastrointestinal: Positive for nausea and abdominal pain. Negative for vomiting and diarrhea.  Genitourinary: Negative for dysuria.  Musculoskeletal: Positive for back pain.  Skin: Negative for rash.  Neurological: Positive for headaches. Negative for dizziness, syncope, speech difficulty, weakness and numbness.  Psychiatric/Behavioral: Negative for suicidal ideas, hallucinations and confusion.    Physical Exam  BP 127/62  Pulse 106  Temp(Src) 99.4 F (37.4 C) (Oral)  Resp 26  Ht 5\' 7"  (1.702 m)  Wt 300 lb (136.079 kg)  BMI 46.99 kg/m2  SpO2 96%  Physical Exam  Nursing  note and vitals reviewed. Constitutional:       Awake, alert, nontoxic appearance with baseline speech for patient.  HENT:  Head: Atraumatic.  Mouth/Throat: No oropharyngeal exudate.  Eyes: EOM are normal. Pupils are equal, round, and reactive to light. Right eye exhibits no discharge. Left eye exhibits no discharge.  Neck: Neck supple.  Cardiovascular: Normal rate and regular rhythm.   No murmur heard. Pulmonary/Chest: Effort normal and breath sounds normal. No stridor. No respiratory distress. He has no wheezes. He has no rales. He exhibits tenderness.  Abdominal: Soft. Bowel sounds are normal. He exhibits no mass. There is tenderness. There is no rebound.       Diffuse mild tenderness mostly right sided  Musculoskeletal: He exhibits no tenderness.       Baseline ROM, moves extremities with no obvious new focal weakness.  Back diffusely tender without CVAT.  Lymphadenopathy:    He has no cervical adenopathy.  Neurological:       Awake, alert, cooperative and aware of situation; motor strength bilaterally; sensation normal to light touch bilaterally; peripheral visual fields full to confrontation; no facial asymmetry; tongue midline; major cranial nerves appear intact; no pronator drift, normal finger to nose bilaterally, baseline gait without new ataxia.  Skin: No rash noted.  Psychiatric: He has a normal mood and affect.    ED Course  Procedures   Date: 12/17/2010  Rate: 109  Rhythm: sinus tachycardia  QRS Axis: left  Intervals: normal  ST/T Wave  abnormalities: normal  Conduction Disutrbances:left anterior fascicular block  Narrative Interpretation:   Old EKG Reviewed: changes noted rate faster   MDM Results for orders placed during the hospital encounter of 12/17/10  CBC      Component Value Range   WBC 23.9 (*) 4.0 - 10.5 (K/uL)   RBC 5.28  4.22 - 5.81 (MIL/uL)   Hemoglobin 14.7  13.0 - 17.0 (g/dL)   HCT 16.1  09.6 - 04.5 (%)   MCV 83.5  78.0 - 100.0 (fL)   MCH 27.8   26.0 - 34.0 (pg)   MCHC 33.3  30.0 - 36.0 (g/dL)   RDW 40.9  81.1 - 91.4 (%)   Platelets 385  150 - 400 (K/uL)  DIFFERENTIAL      Component Value Range   Neutrophils Relative 80 (*) 43 - 77 (%)   Neutro Abs 19.2 (*) 1.7 - 7.7 (K/uL)   Lymphocytes Relative 7 (*) 12 - 46 (%)   Lymphs Abs 1.6  0.7 - 4.0 (K/uL)   Monocytes Relative 13 (*) 3 - 12 (%)   Monocytes Absolute 3.2 (*) 0.1 - 1.0 (K/uL)   Eosinophils Relative 0  0 - 5 (%)   Eosinophils Absolute 0.0  0.0 - 0.7 (K/uL)   Basophils Relative 0  0 - 1 (%)   Basophils Absolute 0.0  0.0 - 0.1 (K/uL)  COMPREHENSIVE METABOLIC PANEL      Component Value Range   Sodium 136  135 - 145 (mEq/L)   Potassium 3.7  3.5 - 5.1 (mEq/L)   Chloride 99  96 - 112 (mEq/L)   CO2 27  19 - 32 (mEq/L)   Glucose, Bld 90  70 - 99 (mg/dL)   BUN 10  6 - 23 (mg/dL)   Creatinine, Ser 7.82  0.50 - 1.35 (mg/dL)   Calcium 9.2  8.4 - 95.6 (mg/dL)   Total Protein 7.7  6.0 - 8.3 (g/dL)   Albumin 3.5  3.5 - 5.2 (g/dL)   AST 19  0 - 37 (U/L)   ALT 19  0 - 53 (U/L)   Alkaline Phosphatase 72  39 - 117 (U/L)   Total Bilirubin 0.6  0.3 - 1.2 (mg/dL)   GFR calc non Af Amer >60  >60 (mL/min)   GFR calc Af Amer >60  >60 (mL/min)  LIPASE, BLOOD      Component Value Range   Lipase 17  11 - 59 (U/L)  URINALYSIS, ROUTINE W REFLEX MICROSCOPIC      Component Value Range   Color, Urine YELLOW  YELLOW    Appearance CLOUDY (*) CLEAR    Specific Gravity, Urine 1.020  1.005 - 1.030    pH 7.5  5.0 - 8.0    Glucose, UA NEGATIVE  NEGATIVE (mg/dL)   Hgb urine dipstick LARGE (*) NEGATIVE    Bilirubin Urine NEGATIVE  NEGATIVE    Ketones, ur NEGATIVE  NEGATIVE (mg/dL)   Protein, ur NEGATIVE  NEGATIVE (mg/dL)   Urobilinogen, UA 0.2  0.0 - 1.0 (mg/dL)   Nitrite POSITIVE (*) NEGATIVE    Leukocytes, UA SMALL (*) NEGATIVE   TROPONIN I      Component Value Range   Troponin I <0.30  <0.30 (ng/mL)  URINE MICROSCOPIC-ADD ON      Component Value Range   WBC, UA 21-50  <3 (WBC/hpf)    RBC / HPF 21-50  <3 (RBC/hpf)   Bacteria, UA MANY (*) RARE   URINE CULTURE      Component  Value Range   Specimen Description URINE, CLEAN CATCH     Special Requests NONE     Setup Time [id number]     Colony Count >=100,000 COLONIES/ML     Culture ESCHERICHIA COLI     Report Status PENDING    Dg Chest 2 View  12/17/2010  *RADIOLOGY REPORT*  Clinical Data: Chest pain.  Fever.  Malaise.  Tachycardia.  CHEST - 2 VIEW  Comparison: 09/14 and / 11  Findings: Low lung volumes are present, causing crowding of the pulmonary vasculature.  Cardiac and mediastinal contours appear normal.  The lungs appear clear.  No pleural effusion is identified.  IMPRESSION:  1.  Low lung volumes.  No significant abnormality identified.  Original Report Authenticated By: Dellia Cloud, M.D.   Ct Abdomen Pelvis W Contrast  12/17/2010  *RADIOLOGY REPORT*  Clinical Data: Right-sided abdominal pain for 3 days.  CT ABDOMEN AND PELVIS WITH CONTRAST  Technique:  Multidetector CT imaging of the abdomen and pelvis was performed following the standard protocol during bolus administration of intravenous contrast.  Contrast: 100 ml Omnipaque-300  Comparison: 02/15/2010  Findings: Mild hepatic steatosis noted.  The spleen is absent.  The gallbladder is absent.  Small porta hepatis and peripancreatic nodes are not pathologically enlarged by size criteria.  No biliary dilatation noted.  The adrenal glands appear normal.  Aside from a left renal cyst which is stable back through 2003, the kidneys appear normal.  No pathologic retroperitoneal adenopathy.  Air-fluid levels are present in several mildly dilated loops of jejunum in the left abdomen, without significant wall thickening appreciated.  No findings of internal hernia or other specific cause for this appearance.  The remainder of the bowel appears unremarkable. The terminal ileum has a normal appearance, as does the appendix.  The urinary bladder appears normal.  No free pelvic  fluid identified.  Borderline prominent right external iliac node is stable from the prior exam.  IMPRESSION:  1.  Several mildly dilated loops of jejunum in the left abdomen demonstrate air fluid levels, probably reflecting enteritis. 2.  Mild hepatic steatosis. 3.  The spleen is absent.  Original Report Authenticated By: Dellia Cloud, M.D.   Patient / Family / Caregiver informed of clinical course, understand medical decision-making process, and agree with plan.  Pt stable in ED with no significant deterioration in condition.      Hurman Horn, MD 12/19/10 [id number]

## 2010-12-17 NOTE — ED Notes (Signed)
Pt presents with left sided chest pain, weakness, and disorientation. Pt also c/o right sided flank pain. Pt a/ox4. Resp even and unlabored. Pt states he took aleve and advil. Pt denies taking NTG.

## 2010-12-20 LAB — URINE CULTURE: Colony Count: >=100000

## 2010-12-21 NOTE — ED Notes (Signed)
+   urine culture Patient treated with  Keflex-sensitive to same-chart appended per protocol

## 2011-02-13 ENCOUNTER — Other Ambulatory Visit: Payer: Self-pay | Admitting: Endocrinology

## 2011-02-13 DIAGNOSIS — E049 Nontoxic goiter, unspecified: Secondary | ICD-10-CM

## 2011-02-15 ENCOUNTER — Ambulatory Visit
Admission: RE | Admit: 2011-02-15 | Discharge: 2011-02-15 | Disposition: A | Discharge status: Home / Self Care | Payer: BC Managed Care – PPO | Source: Ambulatory Visit | Attending: Endocrinology | Admitting: Endocrinology

## 2011-02-15 DIAGNOSIS — E049 Nontoxic goiter, unspecified: Secondary | ICD-10-CM

## 2011-02-18 ENCOUNTER — Emergency Department (HOSPITAL_COMMUNITY): Payer: BC Managed Care – PPO

## 2011-02-18 ENCOUNTER — Encounter (HOSPITAL_COMMUNITY): Payer: Self-pay | Admitting: *Deleted

## 2011-02-18 ENCOUNTER — Other Ambulatory Visit: Payer: Self-pay

## 2011-02-18 ENCOUNTER — Inpatient Hospital Stay (HOSPITAL_COMMUNITY)
Admission: EM | Admit: 2011-02-18 | Discharge: 2011-02-21 | DRG: 320 | Disposition: A | Discharge status: Home / Self Care | Payer: BC Managed Care – PPO | Attending: Internal Medicine | Admitting: Internal Medicine

## 2011-02-18 DIAGNOSIS — E669 Obesity, unspecified: Secondary | ICD-10-CM | POA: Diagnosis present

## 2011-02-18 DIAGNOSIS — G8929 Other chronic pain: Secondary | ICD-10-CM

## 2011-02-18 DIAGNOSIS — Z9081 Acquired absence of spleen: Secondary | ICD-10-CM

## 2011-02-18 DIAGNOSIS — N12 Tubulo-interstitial nephritis, not specified as acute or chronic: Principal | ICD-10-CM | POA: Diagnosis present

## 2011-02-18 DIAGNOSIS — E2749 Other adrenocortical insufficiency: Secondary | ICD-10-CM | POA: Diagnosis present

## 2011-02-18 DIAGNOSIS — Z862 Personal history of diseases of the blood and blood-forming organs and certain disorders involving the immune mechanism: Secondary | ICD-10-CM

## 2011-02-18 DIAGNOSIS — Z9989 Dependence on other enabling machines and devices: Secondary | ICD-10-CM | POA: Diagnosis present

## 2011-02-18 DIAGNOSIS — K589 Irritable bowel syndrome without diarrhea: Secondary | ICD-10-CM | POA: Diagnosis present

## 2011-02-18 DIAGNOSIS — G4733 Obstructive sleep apnea (adult) (pediatric): Secondary | ICD-10-CM | POA: Diagnosis present

## 2011-02-18 DIAGNOSIS — Z9089 Acquired absence of other organs: Secondary | ICD-10-CM

## 2011-02-18 DIAGNOSIS — K219 Gastro-esophageal reflux disease without esophagitis: Secondary | ICD-10-CM | POA: Diagnosis present

## 2011-02-18 HISTORY — DX: Other chronic pain: G89.29

## 2011-02-18 HISTORY — DX: Dorsalgia, unspecified: M54.9

## 2011-02-18 LAB — URINALYSIS, ROUTINE W REFLEX MICROSCOPIC
Ketones, ur: NEGATIVE mg/dL
Nitrite: NEGATIVE
Protein, ur: NEGATIVE mg/dL
pH: 7.5 (ref 5.0–8.0)

## 2011-02-18 LAB — DIFFERENTIAL
Basophils Absolute: 0 10*3/uL (ref 0.0–0.1)
Eosinophils Relative: 0 % (ref 0–5)
Lymphocytes Relative: 9 % — ABNORMAL LOW (ref 12–46)
Neutrophils Relative %: 82 % — ABNORMAL HIGH (ref 43–77)

## 2011-02-18 LAB — URINE MICROSCOPIC-ADD ON

## 2011-02-18 LAB — BASIC METABOLIC PANEL
BUN: 10 mg/dL (ref 6–23)
CO2: 28 mEq/L (ref 19–32)
Glucose, Bld: 105 mg/dL — ABNORMAL HIGH (ref 70–99)
Potassium: 3.7 mEq/L (ref 3.5–5.1)
Sodium: 136 mEq/L (ref 135–145)

## 2011-02-18 LAB — CBC
MCV: 83.6 fL (ref 78.0–100.0)
Platelets: 373 10*3/uL (ref 150–400)
RBC: 5.23 MIL/uL (ref 4.22–5.81)
RDW: 15.1 % (ref 11.5–15.5)
WBC: 22.3 10*3/uL — ABNORMAL HIGH (ref 4.0–10.5)

## 2011-02-18 LAB — POCT I-STAT TROPONIN I

## 2011-02-18 MED ORDER — MELOXICAM 7.5 MG PO TABS
7.5000 mg | ORAL_TABLET | Freq: Every day | ORAL | Status: DC
  Administered 2011-02-19 – 2011-02-21 (×3): 7.5 mg via ORAL
  Filled 2011-02-18 (×8): qty 1

## 2011-02-18 MED ORDER — SIMVASTATIN 20 MG PO TABS
20.0000 mg | ORAL_TABLET | Freq: Every day | ORAL | Status: DC
  Administered 2011-02-18 – 2011-02-20 (×3): 20 mg via ORAL
  Filled 2011-02-18 (×3): qty 1

## 2011-02-18 MED ORDER — TAMSULOSIN HCL 0.4 MG PO CAPS
0.4000 mg | ORAL_CAPSULE | Freq: Every day | ORAL | Status: DC
  Administered 2011-02-19 – 2011-02-21 (×3): 0.4 mg via ORAL
  Filled 2011-02-18 (×3): qty 1

## 2011-02-18 MED ORDER — LEVOFLOXACIN IN D5W 500 MG/100ML IV SOLN
INTRAVENOUS | Status: AC
  Filled 2011-02-18: qty 100

## 2011-02-18 MED ORDER — METHOCARBAMOL 500 MG PO TABS
750.0000 mg | ORAL_TABLET | Freq: Two times a day (BID) | ORAL | Status: DC
  Administered 2011-02-18 – 2011-02-21 (×6): 750 mg via ORAL
  Filled 2011-02-18 (×6): qty 2

## 2011-02-18 MED ORDER — ONDANSETRON HCL 4 MG/2ML IJ SOLN
4.0000 mg | Freq: Four times a day (QID) | INTRAMUSCULAR | Status: DC | PRN
  Filled 2011-02-18: qty 2

## 2011-02-18 MED ORDER — CEFTRIAXONE SODIUM 1 G IJ SOLR
1.0000 g | INTRAMUSCULAR | Status: DC
  Administered 2011-02-19 – 2011-02-20 (×2): 1 g via INTRAVENOUS
  Filled 2011-02-18 (×4): qty 1

## 2011-02-18 MED ORDER — ASPIRIN 81 MG PO CHEW
324.0000 mg | CHEWABLE_TABLET | Freq: Once | ORAL | Status: AC
  Administered 2011-02-18: 324 mg via ORAL
  Filled 2011-02-18: qty 4

## 2011-02-18 MED ORDER — ALPRAZOLAM 0.5 MG PO TABS
0.5000 mg | ORAL_TABLET | Freq: Four times a day (QID) | ORAL | Status: DC | PRN
  Administered 2011-02-19 – 2011-02-21 (×4): 0.5 mg via ORAL
  Filled 2011-02-18 (×4): qty 1

## 2011-02-18 MED ORDER — SODIUM CHLORIDE 0.9 % IV SOLN: Freq: Once | INTRAVENOUS | Status: DC

## 2011-02-18 MED ORDER — SODIUM CHLORIDE 0.9 % IV SOLN
Freq: Once | INTRAVENOUS | Status: AC
  Administered 2011-02-18: 18:00:00 via INTRAVENOUS

## 2011-02-18 MED ORDER — SODIUM CHLORIDE 0.9 % IV SOLN
INTRAVENOUS | Status: AC
  Administered 2011-02-19: 05:00:00 via INTRAVENOUS

## 2011-02-18 MED ORDER — LEVOTHYROXINE SODIUM 25 MCG PO TABS
25.0000 ug | ORAL_TABLET | Freq: Every day | ORAL | Status: DC
  Administered 2011-02-18 – 2011-02-21 (×4): 25 ug via ORAL
  Filled 2011-02-18 (×4): qty 1

## 2011-02-18 MED ORDER — ACETAMINOPHEN 325 MG PO TABS
650.0000 mg | ORAL_TABLET | Freq: Four times a day (QID) | ORAL | Status: DC | PRN
  Administered 2011-02-18 – 2011-02-20 (×2): 650 mg via ORAL
  Filled 2011-02-18 (×2): qty 2

## 2011-02-18 MED ORDER — DEXTROSE 5 % IV SOLN
1.0000 g | Freq: Once | INTRAVENOUS | Status: AC
  Administered 2011-02-18: 1 g via INTRAVENOUS
  Filled 2011-02-18: qty 1

## 2011-02-18 MED ORDER — HYDROCORTISONE 20 MG PO TABS
20.0000 mg | ORAL_TABLET | Freq: Every day | ORAL | Status: DC
  Administered 2011-02-18 – 2011-02-21 (×4): 20 mg via ORAL
  Filled 2011-02-18 (×9): qty 1

## 2011-02-18 MED ORDER — KETOROLAC TROMETHAMINE 30 MG/ML IJ SOLN
30.0000 mg | Freq: Once | INTRAMUSCULAR | Status: AC
  Administered 2011-02-18: 30 mg via INTRAVENOUS
  Filled 2011-02-18: qty 1

## 2011-02-18 MED ORDER — HYDROCORTISONE 20 MG PO TABS
ORAL_TABLET | ORAL | Status: AC
  Filled 2011-02-18: qty 1

## 2011-02-18 MED ORDER — LEVOFLOXACIN IN D5W 500 MG/100ML IV SOLN
500.0000 mg | INTRAVENOUS | Status: DC
  Administered 2011-02-18: 500 mg via INTRAVENOUS
  Filled 2011-02-18: qty 100

## 2011-02-18 MED ORDER — PANTOPRAZOLE SODIUM 40 MG PO TBEC
40.0000 mg | DELAYED_RELEASE_TABLET | Freq: Every day | ORAL | Status: DC
  Administered 2011-02-18 – 2011-02-21 (×4): 40 mg via ORAL
  Filled 2011-02-18 (×4): qty 1

## 2011-02-18 MED ORDER — MORPHINE SULFATE 4 MG/ML IJ SOLN
4.0000 mg | Freq: Once | INTRAMUSCULAR | Status: AC
  Administered 2011-02-18: 4 mg via INTRAVENOUS
  Filled 2011-02-18: qty 1

## 2011-02-18 MED ORDER — ONDANSETRON HCL 4 MG PO TABS: 4.0000 mg | ORAL_TABLET | Freq: Four times a day (QID) | ORAL | Status: DC | PRN

## 2011-02-18 NOTE — H&P (Signed)
  [id number] 

## 2011-02-18 NOTE — ED Notes (Signed)
Pt alert and oriented x 3. Skin warm and dry. Color pink. Breath sounds clear and equal bilaterally. Abdomen soft and nondistended. Tender to palpation in lower back and lower abdomen. CCM showing NSR with rate of 95.

## 2011-02-18 NOTE — ED Notes (Signed)
Pt c/o chest tightness, shortness of breath, lower back pain, nausea, loss of appetite, fever, chills and feeling weak and tired since yesterday. Diagnosed a month ago with a kidney infection. States that it is hard to urinate and hurts with urination.

## 2011-02-18 NOTE — ED Provider Notes (Signed)
Scribed for Dayton Bailiff, MD, the patient was seen in room [facility]12[facility] . This chart was scribed by Ellie Lunch. This patient's care was started at 7:59 PM.   CSN: [id number] Arrival date & time: 02/18/2011  5:24 PM   Chief Complaint  Patient presents with  . Chest Pain   (Include location/radiation/quality/duration/timing/severity/associated sxs/prior treatment) HPI [name] [name] is a 45 y.o. male who presents to the Emergency Department complaining of difficulty urinating starting last night with assocaited burning, frequency,  pain in lower back and bilateral sides.  Describes urination pain "like he was passing a stone, but has no hx of kidney stones."  Pt reports being dx with a kidney infection last month. Completed course of keflex and called in for extra. Pt started back on keflex last night b/c he could not urinate. Additionally c/o chest tightness and neck soreness. Reports similar sx when he was dx with kidney infection. No bowel movement problems, sometimes hurts internally, but believes it is b/c of internal and extrenal hemorrhoids.  Taking keflex on and off for years.   HPI ELEMENTS:   Location: urinary tract Onset: last night  Timing: constant  Quality: burning  Severity: 3/10 currently Modifying factors: Urinating  Context: as above  Associated symptoms: burning and frequency   PCP: Dr Timoteo Expose  Past Medical History  Diagnosis Date  . Sleep apnea   . GERD (gastroesophageal reflux disease)   . Thyroid disease     Past Surgical History  Procedure Date  . Splenectomy   . Cholecystectomy    History reviewed. No pertinent family history.  History  Substance Use Topics  . Smoking status: Never Smoker   . Smokeless tobacco: Not on file  . Alcohol Use: No    Review of Systems 10 Systems reviewed and are negative for acute change except as noted in the HPI.  Allergies  Hydromorphone hcl  Home Medications   Current Outpatient Rx  Name Route Sig Dispense  Refill  . ALPRAZOLAM 0.5 MG PO TABS Oral Take 0.5 mg by mouth 4 (four) times daily as needed.      . CEPHALEXIN 500 MG PO CAPS  2 po bid x 10 days 40 capsule 0  . HYDROCORTISONE 20 MG PO TABS Oral Take 20 mg by mouth daily.      Marland Kitchen LEVOTHYROXINE SODIUM 25 MCG PO TABS Oral Take 25 mcg by mouth daily.      . MELOXICAM 7.5 MG PO TABS Oral Take 7.5 mg by mouth daily.      Marland Kitchen METHOCARBAMOL 750 MG PO TABS Oral Take 750 mg by mouth 2 (two) times daily.     Marland Kitchen MENS MULTIVITAMIN PLUS PO TABS Oral Take 1 tablet by mouth daily.      Marland Kitchen OMEPRAZOLE 20 MG PO CPDR Oral Take 20 mg by mouth 2 (two) times daily.      Marland Kitchen SIMVASTATIN 20 MG PO TABS Oral Take 20 mg by mouth at bedtime.      . TAMSULOSIN HCL 0.4 MG PO CAPS Oral Take 0.4 mg by mouth daily.        Physical Exam    BP 146/76  Pulse 94  Temp(Src) 99.3 F (37.4 C) (Oral)  Resp 18  Ht 5\' 7"  (1.702 m)  Wt 305 lb (138.347 kg)  BMI 47.77 kg/m2  SpO2 99%  Physical Exam  Nursing note and vitals reviewed. Constitutional: He is oriented to person, place, and time. He appears well-developed and well-nourished.  Appears obese  HENT:  Head: Normocephalic and atraumatic.  Eyes: Conjunctivae and EOM are normal. Pupils are equal, round, and reactive to light.  Neck: Normal range of motion. Neck supple.  Cardiovascular: Normal rate, regular rhythm and normal heart sounds.   Pulmonary/Chest: Effort normal and breath sounds normal.  Abdominal: Soft. There is tenderness.       Suprapubic   Genitourinary:       Prostate tenderness  Musculoskeletal: Normal range of motion.       No CVA tenderness. Tenderness lower back.   Neurological: He is alert and oriented to person, place, and time.  Skin: Skin is warm and dry.  Psychiatric: He has a normal mood and affect.   Procedures  OTHER DATA REVIEWED: Nursing notes, vital signs, and past medical records reviewed.  DIAGNOSTIC STUDIES: Oxygen Saturation is 99% on room air, normal by my interpretation.      LABS / RADIOLOGY:  Results for orders placed during the hospital encounter of 02/18/11  CBC      Component Value Range   WBC 22.3 (*) 4.0 - 10.5 (K/uL)   RBC 5.23  4.22 - 5.81 (MIL/uL)   Hemoglobin 14.6  13.0 - 17.0 (g/dL)   HCT 91.4  78.2 - 95.6 (%)   MCV 83.6  78.0 - 100.0 (fL)   MCH 27.9  26.0 - 34.0 (pg)   MCHC 33.4  30.0 - 36.0 (g/dL)   RDW 21.3  08.6 - 57.8 (%)   Platelets 373  150 - 400 (K/uL)  DIFFERENTIAL      Component Value Range   Neutrophils Relative 82 (*) 43 - 77 (%)   Neutro Abs 18.3 (*) 1.7 - 7.7 (K/uL)   Lymphocytes Relative 9 (*) 12 - 46 (%)   Lymphs Abs 2.0  0.7 - 4.0 (K/uL)   Monocytes Relative 9  3 - 12 (%)   Monocytes Absolute 1.9 (*) 0.1 - 1.0 (K/uL)   Eosinophils Relative 0  0 - 5 (%)   Eosinophils Absolute 0.1  0.0 - 0.7 (K/uL)   Basophils Relative 0  0 - 1 (%)   Basophils Absolute 0.0  0.0 - 0.1 (K/uL)  URINALYSIS, ROUTINE W REFLEX MICROSCOPIC      Component Value Range   Color, Urine YELLOW  YELLOW    Appearance HAZY (*) CLEAR    Specific Gravity, Urine 1.015  1.005 - 1.030    pH 7.5  5.0 - 8.0    Glucose, UA NEGATIVE  NEGATIVE (mg/dL)   Hgb urine dipstick SMALL (*) NEGATIVE    Bilirubin Urine NEGATIVE  NEGATIVE    Ketones, ur NEGATIVE  NEGATIVE (mg/dL)   Protein, ur NEGATIVE  NEGATIVE (mg/dL)   Urobilinogen, UA 0.2  0.0 - 1.0 (mg/dL)   Nitrite NEGATIVE  NEGATIVE    Leukocytes, UA TRACE (*) NEGATIVE   BASIC METABOLIC PANEL      Component Value Range   Sodium 136  135 - 145 (mEq/L)   Potassium 3.7  3.5 - 5.1 (mEq/L)   Chloride 100  96 - 112 (mEq/L)   CO2 28  19 - 32 (mEq/L)   Glucose, Bld 105 (*) 70 - 99 (mg/dL)   BUN 10  6 - 23 (mg/dL)   Creatinine, Ser 4.69  0.50 - 1.35 (mg/dL)   Calcium 9.5  8.4 - 62.9 (mg/dL)   GFR calc non Af Amer >60  >60 (mL/min)   GFR calc Af Amer >60  >60 (mL/min)  POCT I-STAT TROPONIN I  Component Value Range   Troponin i, poc 0.00  0.00 - 0.08 (ng/mL)   Comment 3           URINE MICROSCOPIC-ADD ON       Component Value Range   WBC, UA 11-20  <3 (WBC/hpf)   RBC / HPF 7-10  <3 (RBC/hpf)   Bacteria, UA MANY (*) RARE     Date: 02/18/2011  Rate: 98  Rhythm: normal sinus rhythm  QRS Axis: left  Intervals: normal  ST/T Wave abnormalities: normal  Conduction Disutrbances:left anterior fascicular block  Narrative Interpretation:   Old EKG Reviewed: none available  ED COURSE / COORDINATION OF CARE: 17:54 EDP discussed treatments and diagnostics with PT and wife.  Orders Placed This Encounter  Procedures  . Urine culture  . Culture, blood (routine x 2)  . DG Chest 2 View  . CT Abdomen Pelvis Wo Contrast  . CBC  . Differential  . Urinalysis with microscopic  . Basic metabolic panel  . Urine microscopic-add on  . Cortisol  . Insert foley catheter  . POCT i-Stat troponin I  . Bed Request ED to IP   Medications  aspirin chewable tablet 324 mg   ketorolac (TORADOL) 30 MG/ML injection 30 mg   morphine 4 MG/ML injection 4 mg    19:21: Pt recheck. Pt reports little improvement. EDP discussed possible admission.  19:48 EDP consult with hospitalist for admission. Will admit to [facility].   pyelonephritis  MDM: Patient's presentation is consistent with his prior episodes of pyelonephritis. Foley catheter was placed with 400 cc of urine out. His urine is consistent with a urinary tract infection and given the complex of his symptoms this is pyelonephritis. I administered a dose of ceftriaxone as well as IV fluids. I also administered pain medication and Toradol for both pain and fever control. Chest x-ray and CT scan were performed. CT shows is nonobstructing stone is stable as compared to prior studies. I mentioned the thickening of the jejunum to the admitting service but did not treat as this was been persistent since last study. Had white count of 22. The patient will be admitted to the [facility] hospitalist service. I spoke with Dr. Onalee Hua. Patient remained stable one emergency department. We added  on blood cultures, cell urine culture, random cortisol.  SCRIBE ATTESTATION: I personally performed the services described in this documentation, which was scribed in my presence. The recorded information has been reviewed and considered.          Dayton Bailiff, MD 02/18/11 2001

## 2011-02-18 NOTE — ED Notes (Signed)
Patient and family are comfortable do not need anything at this time.

## 2011-02-19 DIAGNOSIS — Z9081 Acquired absence of spleen: Secondary | ICD-10-CM

## 2011-02-19 DIAGNOSIS — Z9989 Dependence on other enabling machines and devices: Secondary | ICD-10-CM | POA: Diagnosis present

## 2011-02-19 DIAGNOSIS — E669 Obesity, unspecified: Secondary | ICD-10-CM | POA: Diagnosis present

## 2011-02-19 DIAGNOSIS — Z862 Personal history of diseases of the blood and blood-forming organs and certain disorders involving the immune mechanism: Secondary | ICD-10-CM

## 2011-02-19 DIAGNOSIS — N12 Tubulo-interstitial nephritis, not specified as acute or chronic: Secondary | ICD-10-CM | POA: Diagnosis present

## 2011-02-19 DIAGNOSIS — G4733 Obstructive sleep apnea (adult) (pediatric): Secondary | ICD-10-CM | POA: Diagnosis present

## 2011-02-19 LAB — CBC
Platelets: 358 10*3/uL (ref 150–400)
RBC: 4.78 MIL/uL (ref 4.22–5.81)
RDW: 15.3 % (ref 11.5–15.5)
WBC: 20.8 10*3/uL — ABNORMAL HIGH (ref 4.0–10.5)

## 2011-02-19 LAB — BASIC METABOLIC PANEL
Calcium: 8.7 mg/dL (ref 8.4–10.5)
Creatinine, Ser: 0.84 mg/dL (ref 0.50–1.35)
GFR calc Af Amer: >60 mL/min (ref >60)
GFR calc non Af Amer: >60 mL/min (ref >60)
Sodium: 137 mEq/L (ref 135–145)

## 2011-02-19 LAB — CORTISOL: Cortisol, Plasma: 11.5 ug/dL

## 2011-02-19 MED ORDER — HYDROCODONE-ACETAMINOPHEN 5-325 MG PO TABS
1.0000 | ORAL_TABLET | Freq: Four times a day (QID) | ORAL | Status: DC | PRN
  Administered 2011-02-19 (×2): 1 via ORAL
  Administered 2011-02-20 (×2): 2 via ORAL
  Filled 2011-02-19 (×2): qty 1
  Filled 2011-02-19: qty 2
  Filled 2011-02-19 (×2): qty 1

## 2011-02-19 NOTE — Progress Notes (Signed)
UR Chart Review Completed  

## 2011-02-19 NOTE — H&P (Signed)
[name] [name], BOWDEN NO.:  [id number]  MEDICAL RECORD NO.:  [id number]  LOCATION:  A303                          FACILITY:  [facility]  PHYSICIAN:  Tarry Kos, MD       DATE OF BIRTH:  17-Jun-1965  DATE OF ADMISSION:  02/18/2011 DATE OF DISCHARGE:  LH                             HISTORY & PHYSICAL   CHIEF COMPLAINT:  Fever, suprapubic pain.  HISTORY OF PRESENT ILLNESS:  [name] [name] is a 45 year old male who has a history that is uncertain.  He has a very poor understanding of his past medical history, but apparently he is on Cortef for what I am assuming his adrenal insufficiency.  He has never heard the word adrenal insufficiency, however, his cortisol levels have been low for 2 years. He does see an endocrinologist for that.  He also has a history of hypothyroidism and is chronically on Keflex daily for what sounds like recurrent bladder infections and recurrent upper respiratory tract issues that he was placed on by his primary care physician for many years now.  He comes in because he has been having myalgias, fever at home, suprapubic pain with some associated lower back pain, but not flank pain.  He has also been having some dysuria with some urinary frequency and his urine has been nonbloody.  He says he does not get kidney infections or bladder infections very often.  This has only happened to him twice before, however, he says he is on the Keflex for just recurrent infections in his bladder and his upper respiratory tract.  He has been nauseous without any vomiting.  He denies any diarrhea.  Denies any abdominal pain.  Again, history is very hard to obtain as he pretty much does not really have a very good insight into his medical history and problems.  REVIEW OF SYSTEMS:  Otherwise negative.  PAST MEDICAL HISTORY: 1. Presumptive adrenal insufficiency with low cortisol level for at     least 2 years, sees an endocrinologist. 2. Hypothyroidism. 3.  Presumptive recurrent infections of the urinary tract along with     upper respiratory tract for which he is on chronic Keflex for at     least a year placed on by his primary care physician. 4. Obstructive sleep apnea, CPAP dependent. 5. Irritable bowel syndrome. 6. History of ITP. 7. Status post splenectomy in 2003, status post cholecystectomy,     history of hemorrhoids. 8. History of major depression with history of suicidal ideations in     the past.  ALLERGIES:  He reports no allergies to me, however, in his records HYDROMORPHONE is listed.  MEDICATIONS:  He takes Xanax.  He takes Keflex, Cortef 20 mg daily, Synthroid 25 mcg daily, Mobic, Robaxin, multivitamin, Prilosec, Zocor, and Flomax.  SOCIAL HISTORY:  He does not smoke.  No IV drug abuse and no alcohol use.  PHYSICAL EXAMINATION:  VITAL SIGNS:  Temperature 101.3 max, pulse 106, respirations 20, blood pressure 120/73, and 99% O2 sats on room air. GENERAL:  He is alert and oriented x4, no apparent distress, cooperative and friendly, nontoxic appearing. HEENT:  Extraocular muscles are intact.  Pupils are  equal and reactive to light.  Oropharynx is clear.  Mucous membranes are moist. NECK:  No JVD.  No carotid bruits. CARDIAC:  Regular rate and rhythm without murmurs, rubs, or gallops. CHEST:  Clear to auscultation bilaterally.  No wheezes, rhonchi, or rales. ABDOMEN:  Soft, nontender, and nondistended.  Positive bowel sounds.  No rebound.  No guarding.  Lower back pain on palpation in the perispinal lumbar area. EXTREMITIES:  No clubbing, cyanosis, or edema. PSYCH:  Normal mood and affect. NEURO:  No focal neurologic deficits. SKIN:  No rashes.  LABORATORY DATA:  His white count is elevated at 22.3.  He has got a left shift.  Electrolytes are normal.  BUN and creatinine are normal. Urinalysis:  Many bacteria, trace leukocytes.  Urine culture has been sent.  Blood cultures have been sent.  Troponin is negative.  CT  of the abdomen and pelvis shows mildly thickened loops jejunum, question enteritis, question infiltrating neoplasm such as lymphoma, nonobstructive left interpolar renal calculus, and left renal cyst. Chest x-ray shows no acute issues.  EKG shows normal sinus rhythm without any acute ST-T wave changes.  Random cortisol level has been sent.  ASSESSMENT/PLAN:  This is 45 year old male with urinary tract infection/early pyelonephritis. 1. Urinary tract infection/early pyelonephritis with question of early     systemic inflammatory response syndrome.  We will place the patient     on Rocephin and Levaquin due to the fact that he is chronically on     Keflex until cultures come back.  I cannot find any previous     cultures in the computer system, but we will double cover him due     to his chronic antibiotic studies on for really unclear reasons.     Abdominal exam is benign. 2. Question of adrenal insufficiency.  I will continue his Cortef that     he is on chronically and his blood pressure is stable.  I will not     stress dose him with any steroids at this point.  Random cortisol     level has been sent off.  If his blood pressure would drop and if     he does not respond to IV fluids and antibiotics, we would consider     stress dosing him with steroids.  He has been followed by an     endocrinologist who he just saw this past week at which point an     ultrasound of his thyroid was taken.  Again, continue his chronic     Cortef dosage. 3. Obstructive sleep apnea, continuous positive airway pressure     dependent at night.  Continue CPAP. 4. CT abnormality, mildly thickened jejunum.  The patient is not     having any symptoms of focal enteritis, not having any diarrhea.     He does have a little bowel syndrome.  This will need close     outpatient followup as neoplasia such as lymphoma is dictated as a     consideration by the radiologist. 5. Anxiety and depression.  I will continue  his Xanax while he is in     the hospital. 6. The patient is a full code.  Further recommendation pending overall     hospital course.  ______________________________ Tarry Kos, MD     RD/MEDQ  D:  02/18/2011  T:  02/19/2011  Job:  [id number]

## 2011-02-19 NOTE — Progress Notes (Signed)
Chart reviewed  Subjective: Feels a little better today. Still has bilateral flank pain. Suprapubic pain slightly improved. Didn't sleep well last night. Myalgias.  Objective: Vital signs in last 24 hours: Filed Vitals:   02/18/11 2054 02/18/11 2141 02/18/11 2333 02/19/11 0539  BP: 131/57 120/73  113/72  Pulse: 107 106  71  Temp:  101.3 F (38.5 C)  97 F (36.1 C)  TempSrc:  Oral  Oral  Resp:  20  20  Height:  5\' 7"  (1.702 m)    Weight:  117.1 kg (258 lb 2.5 oz)    SpO2: 96% 95% 95% 94%   Weight change:   Intake/Output Summary (Last 24 hours) at 02/19/11 1109 Last data filed at 02/19/11 0800  Gross per 24 hour  Intake    979 ml  Output    550 ml  Net    429 ml   General: Nontoxic. Watching TV. Talkative Lungs clear to auscultation bilaterally without wheezes rhonchi or rales Cardiovascular regular rate rhythm without murmurs gallops rubs Abdomen is obese soft mild suprapubic tenderness GU Foley catheter draining amber urine which is clear Extremities no clubbing cyanosis or edema  Lab Results: Basic Metabolic Panel:  Lab 02/19/11 1610 02/18/11 1803  NA 137 136  K 3.5 3.7  CL 104 100  CO2 25 28  GLUCOSE 164* 105*  BUN 13 10  CREATININE 0.84 0.86  CALCIUM 8.7 9.5  MG -- --  PHOS -- --   Liver Function Tests: No results found for this basename: AST:2,ALT:2,ALKPHOS:2,BILITOT:2,PROT:2,ALBUMIN:2 in the last 168 hours No results found for this basename: LIPASE:2,AMYLASE:2 in the last 168 hours No results found for this basename: AMMONIA:2 in the last 168 hours CBC:  Lab 02/19/11 0439 02/18/11 1803  WBC 20.8* 22.3*  NEUTROABS -- 18.3*  HGB 13.2 14.6  HCT 40.1 43.7  MCV 83.9 83.6  PLT 358 373   Cardiac Enzymes: No results found for this basename: CKTOTAL:3,CKMB:3,CKMBINDEX:3,TROPONINI:3 in the last 168 hours BNP: No results found for this basename: POCBNP:3 in the last 168 hours D-Dimer: No results found for this basename: DDIMER:2 in the last 168  hours CBG: No results found for this basename: GLUCAP:6 in the last 168 hours Hemoglobin A1C: No results found for this basename: HGBA1C in the last 168 hours Fasting Lipid Panel: No results found for this basename: CHOL,HDL,LDLCALC,TRIG,CHOLHDL,LDLDIRECT in the last 960 hours Thyroid Function Tests: No results found for this basename: TSH,T4TOTAL,FREET4,T3FREE,THYROIDAB in the last 168 hours Anemia Panel: No results found for this basename: VITAMINB12,FOLATE,FERRITIN,TIBC,IRON,RETICCTPCT in the last 168 hours  Alcohol Level: No results found for this basename: ETH:2 in the last 168 hours   Micro Results: Recent Results (from the past 240 hour(s))  CULTURE, BLOOD (ROUTINE X 2)     Status: Normal (Preliminary result)   Collection Time   02/18/11  8:03 PM      Component Value Range Status Comment   Specimen Description Blood   Final    Special Requests Normal   Final    Culture NO GROWTH 1 DAY   Final    Report Status PENDING   Incomplete   CULTURE, BLOOD (ROUTINE X 2)     Status: Normal (Preliminary result)   Collection Time   02/18/11  8:09 PM      Component Value Range Status Comment   Specimen Description Blood LEFT HAND   Final    Special Requests     Final    Value: BOTTLES DRAWN AEROBIC AND ANAEROBIC [facility] EACH  BOTTLE   Culture NO GROWTH 1 DAY   Final    Report Status PENDING   Incomplete     urine culture is pending  Studies/Results: Ct Abdomen Pelvis Wo Contrast  02/18/2011  *RADIOLOGY REPORT*  Clinical Data: Bilateral flank pain.  Cholecystectomy.  CT ABDOMEN AND PELVIS WITHOUT CONTRAST  Technique:  Multidetector CT imaging of the abdomen and pelvis was performed following the standard protocol without intravenous contrast.  Comparison: 12/17/2010.  Findings: The lung bases appear within normal.  Liver is normal. The spleen is absent, presuming splenectomy.  Adrenal glands are normal.  Unchanged left renal cyst.  2 mm nonobstructing left renal collecting system calculus.   No right renal calculi are present. No ureteral calculus is present.  Calcified phleboliths are present in the anatomic pelvis.  Foley catheter in the urinary bladder.  Cholecystectomy.  Pancreas appears normal. Unenhanced CT was performed per clinician order.  Lack of IV contrast limits sensitivity and specificity, especially for evaluation of abdominal/pelvic solid viscera.  Mildly thickened loops of small bowel are present within the central abdomen without evidence of obstruction or mesenteric inflammation.  This can be associated with inflammatory bowel disease, infectious enteritis or infiltrating bowel neoplasm.  There is no lymphadenopathy.  Bones appear within normal limits.  IMPRESSION: 1.  Mildly thickened loops of jejunum in the central abdomen.  This may represent a focal enteritis such as infection or inflammatory bowel disease.  Infiltrating neoplasm such as lymphoma is considered less likely however this thickening has persisted compared to 12/17/2010. 2.  Nonobstructing left interpolar renal calculus and left renal cyst. 3.  Cholecystectomy. 4.  Follow-up outpatient c t enterography may be useful to reassess the jejunum following empiric treatment.  Original Report Authenticated By: Andreas Newport, M.D.   Dg Chest 2 View  02/18/2011  *RADIOLOGY REPORT*  Clinical Data: Chest pain.  CHEST - 2 VIEW  Comparison: 12/17/2010.  Findings: Cardiopericardial silhouette and mediastinal contours are within normal limits.  No airspace disease.  No effusion.  The inferior costophrenic angles are excluded from view on the lateral. Trachea midline. Monitoring leads are projected over the chest.  IMPRESSION: No active cardiopulmonary disease.  Original Report Authenticated By: Andreas Newport, M.D.   Medications: I have reviewed the patient's current medications.  Scheduled Meds:   . sodium chloride   Intravenous Once  . aspirin  324 mg Oral Once  . cefTRIAXone (ROCEPHIN) IVPB 1 gram/50 mL D5W  1 g  Intravenous Once  . cefTRIAXone (ROCEPHIN) IV  1 g Intravenous Q24H  . hydrocortisone  20 mg Oral Daily  . ketorolac  30 mg Intravenous Once  . levothyroxine  25 mcg Oral Daily  . meloxicam  7.5 mg Oral Daily  . methocarbamol  750 mg Oral BID  . morphine  4 mg Intravenous Once  . pantoprazole  40 mg Oral Q1200  . simvastatin  20 mg Oral QHS  . Tamsulosin HCl  0.4 mg Oral Daily  . DISCONTD: sodium chloride   Intravenous Once  . DISCONTD: levofloxacin (LEVAQUIN) IV  500 mg Intravenous Q24H   Continuous Infusions:   . sodium chloride 75 mL/hr at 02/19/11 0454   PRN Meds:.acetaminophen, ALPRAZolam, HYDROcodone-acetaminophen, ondansetron (ZOFRAN) IV, ondansetron Assessment/Plan: Active Problems:  Pyelonephritis  GERD  Irritable bowel syndrome  Obesity  Status post splenectomy  History of ITP  OSA on CPAP  Patient will be continued on Rocephin. No need for levofloxacin.. This. Remove Foley catheter. Monitor urine output. Followup urine culture. Supportive care.  Continue outpatient medications. Patient had multiple questions which were answered. Total time 35 minutes.   LOS: 1 day   Sybol Morre L 02/19/2011, 11:09 AM

## 2011-02-20 LAB — URINE CULTURE: Culture  Setup Time: 201209162230

## 2011-02-20 MED ORDER — ALUM & MAG HYDROXIDE-SIMETH 400-400-40 MG/5ML PO SUSP
30.0000 mL | Freq: Three times a day (TID) | ORAL | Status: DC | PRN
  Filled 2011-02-20: qty 30

## 2011-02-20 MED ORDER — SODIUM CHLORIDE 0.9 % IJ SOLN
INTRAMUSCULAR | Status: AC
  Filled 2011-02-20: qty 10

## 2011-02-20 MED ORDER — PHENAZOPYRIDINE HCL 100 MG PO TABS
200.0000 mg | ORAL_TABLET | Freq: Three times a day (TID) | ORAL | Status: DC
  Administered 2011-02-20 – 2011-02-21 (×4): 200 mg via ORAL
  Filled 2011-02-20 (×4): qty 2

## 2011-02-20 MED ORDER — ENOXAPARIN SODIUM 40 MG/0.4ML ~~LOC~~ SOLN
40.0000 mg | Freq: Every day | SUBCUTANEOUS | Status: DC
  Administered 2011-02-20 – 2011-02-21 (×2): 40 mg via SUBCUTANEOUS
  Filled 2011-02-20 (×2): qty 0.4

## 2011-02-20 NOTE — Progress Notes (Addendum)
Subjective: Complaining of dysuria and urinary frequency. He removed his SCDs yesterday and did not put him back on. Complains of chest and epigastric discomfort that lasts a few seconds or minutes at a time. Thinks it might be anxiety related. Took Xanax yesterday with some relief. Has a history of gastroesophageal reflux disease but this does not feel like heartburn. Denies shortness of breath.  Objective: Vital signs in last 24 hours: Filed Vitals:   02/19/11 0539 02/19/11 1400 02/19/11 2200 02/20/11 0522  BP: 113/72 120/78 159/84 123/71  Pulse: 71 100 88 76  Temp: 97 F (36.1 C) 98.1 F (36.7 C) 98.4 F (36.9 C) 98.5 F (36.9 C)  TempSrc: Oral Oral Oral Oral  Resp: 20 18 20 20   Height:      Weight:      SpO2: 94% 95% 96% 97%   Weight change:   Intake/Output Summary (Last 24 hours) at 02/20/11 1110 Last data filed at 02/20/11 0500  Gross per 24 hour  Intake 1072.5 ml  Output   2125 ml  Net -1052.5 ml   Exam unchanged from 02/19/2011  Lab Results: Basic Metabolic Panel:  Lab 02/19/11 9147 02/18/11 1803  NA 137 136  K 3.5 3.7  CL 104 100  CO2 25 28  GLUCOSE 164* 105*  BUN 13 10  CREATININE 0.84 0.86  CALCIUM 8.7 9.5  MG -- --  PHOS -- --   Liver Function Tests: No results found for this basename: AST:2,ALT:2,ALKPHOS:2,BILITOT:2,PROT:2,ALBUMIN:2 in the last 168 hours No results found for this basename: LIPASE:2,AMYLASE:2 in the last 168 hours No results found for this basename: AMMONIA:2 in the last 168 hours CBC:  Lab 02/19/11 0439 02/18/11 1803  WBC 20.8* 22.3*  NEUTROABS -- 18.3*  HGB 13.2 14.6  HCT 40.1 43.7  MCV 83.9 83.6  PLT 358 373   Cardiac Enzymes: No results found for this basename: CKTOTAL:3,CKMB:3,CKMBINDEX:3,TROPONINI:3 in the last 168 hours BNP: No results found for this basename: POCBNP:3 in the last 168 hours D-Dimer: No results found for this basename: DDIMER:2 in the last 168 hours CBG: No results found for this basename:  GLUCAP:6 in the last 168 hours Hemoglobin A1C: No results found for this basename: HGBA1C in the last 168 hours Fasting Lipid Panel: No results found for this basename: CHOL,HDL,LDLCALC,TRIG,CHOLHDL,LDLDIRECT in the last 829 hours Thyroid Function Tests: No results found for this basename: TSH,T4TOTAL,FREET4,T3FREE,THYROIDAB in the last 168 hours Anemia Panel: No results found for this basename: VITAMINB12,FOLATE,FERRITIN,TIBC,IRON,RETICCTPCT in the last 168 hours  Alcohol Level: No results found for this basename: ETH:2 in the last 168 hours   Micro Results: Recent Results (from the past 240 hour(s))  URINE CULTURE     Status: Normal   Collection Time   02/18/11  6:35 PM      Component Value Range Status Comment   Specimen Description URINE, CATHETERIZED   Final    Special Requests NONE   Final    Setup Time [id number]   Final    Colony Count NO GROWTH   Final    Culture NO GROWTH   Final    Report Status 02/20/2011 FINAL   Final   CULTURE, BLOOD (ROUTINE X 2)     Status: Normal (Preliminary result)   Collection Time   02/18/11  8:03 PM      Component Value Range Status Comment   Specimen Description BLOOD RIGHT ANTECUBITAL   Final    Special Requests BOTTLES DRAWN AEROBIC AND ANAEROBIC 6CC   Final  Culture NO GROWTH 2 DAYS   Final    Report Status PENDING   Incomplete   CULTURE, BLOOD (ROUTINE X 2)     Status: Normal (Preliminary result)   Collection Time   02/18/11  8:09 PM      Component Value Range Status Comment   Specimen Description BLOOD LEFT HAND   Final    Special Requests     Final    Value: BOTTLES DRAWN AEROBIC AND ANAEROBIC 6CC EACH BOTTLE   Culture NO GROWTH 2 DAYS   Final    Report Status PENDING   Incomplete     urine culture is pending  Studies/Results: Ct Abdomen Pelvis Wo Contrast  02/18/2011  *RADIOLOGY REPORT*  Clinical Data: Bilateral flank pain.  Cholecystectomy.  CT ABDOMEN AND PELVIS WITHOUT CONTRAST  Technique:  Multidetector CT imaging of  the abdomen and pelvis was performed following the standard protocol without intravenous contrast.  Comparison: 12/17/2010.  Findings: The lung bases appear within normal.  Liver is normal. The spleen is absent, presuming splenectomy.  Adrenal glands are normal.  Unchanged left renal cyst.  2 mm nonobstructing left renal collecting system calculus.  No right renal calculi are present. No ureteral calculus is present.  Calcified phleboliths are present in the anatomic pelvis.  Foley catheter in the urinary bladder.  Cholecystectomy.  Pancreas appears normal. Unenhanced CT was performed per clinician order.  Lack of IV contrast limits sensitivity and specificity, especially for evaluation of abdominal/pelvic solid viscera.  Mildly thickened loops of small bowel are present within the central abdomen without evidence of obstruction or mesenteric inflammation.  This can be associated with inflammatory bowel disease, infectious enteritis or infiltrating bowel neoplasm.  There is no lymphadenopathy.  Bones appear within normal limits.  IMPRESSION: 1.  Mildly thickened loops of jejunum in the central abdomen.  This may represent a focal enteritis such as infection or inflammatory bowel disease.  Infiltrating neoplasm such as lymphoma is considered less likely however this thickening has persisted compared to 12/17/2010. 2.  Nonobstructing left interpolar renal calculus and left renal cyst. 3.  Cholecystectomy. 4.  Follow-up outpatient c t enterography may be useful to reassess the jejunum following empiric treatment.  Original Report Authenticated By: Andreas Newport, M.D.   Dg Chest 2 View  02/18/2011  *RADIOLOGY REPORT*  Clinical Data: Chest pain.  CHEST - 2 VIEW  Comparison: 12/17/2010.  Findings: Cardiopericardial silhouette and mediastinal contours are within normal limits.  No airspace disease.  No effusion.  The inferior costophrenic angles are excluded from view on the lateral. Trachea midline. Monitoring leads  are projected over the chest.  IMPRESSION: No active cardiopulmonary disease.  Original Report Authenticated By: Andreas Newport, M.D.    Scheduled Meds:    . cefTRIAXone (ROCEPHIN) IV  1 g Intravenous Q24H  . enoxaparin  40 mg Subcutaneous Q24H  . hydrocortisone  20 mg Oral Daily  . levothyroxine  25 mcg Oral Daily  . meloxicam  7.5 mg Oral Daily  . methocarbamol  750 mg Oral BID  . pantoprazole  40 mg Oral Q1200  . phenazopyridine  200 mg Oral TID WC  . simvastatin  20 mg Oral QHS  . Tamsulosin HCl  0.4 mg Oral Daily   Continuous Infusions:  PRN Meds:.acetaminophen, ALPRAZolam, alum & mag hydroxide-simeth, HYDROcodone-acetaminophen, ondansetron (ZOFRAN) IV, ondansetron Assessment/Plan: Active Problems:  Pyelonephritis  GERD  Irritable bowel syndrome  Obesity  Status post splenectomy  History of ITP  OSA on CPAP  Patient will  be continued on Rocephin. Urine culture is negative, but he was on cephalexin as an outpatient prior to admission. Start Pyridium for dysuria.  Chest pain most likely musculoskeletal or anxiety. Continue supportive care.  With regard to his abnormal jejunal thickening on CAT scan, can followup with Dr. Jena Gauss as an outpatient.   LOS: 2 days   Khianna Blazina L 02/20/2011, 11:10 AM

## 2011-02-21 LAB — BASIC METABOLIC PANEL
CO2: 26 mEq/L (ref 19–32)
Calcium: 8.7 mg/dL (ref 8.4–10.5)
GFR calc Af Amer: >60 mL/min (ref >60)
GFR calc non Af Amer: >60 mL/min (ref >60)
Sodium: 138 mEq/L (ref 135–145)

## 2011-02-21 LAB — CBC
MCH: 27.3 pg (ref 26.0–34.0)
Platelets: 432 10*3/uL — ABNORMAL HIGH (ref 150–400)
RBC: 5.02 MIL/uL (ref 4.22–5.81)
RDW: 15 % (ref 11.5–15.5)

## 2011-02-21 MED ORDER — CEFUROXIME AXETIL 500 MG PO TABS: 500.0000 mg | ORAL_TABLET | Freq: Two times a day (BID) | ORAL | Status: AC

## 2011-02-21 MED ORDER — HYDROCODONE-ACETAMINOPHEN 5-325 MG PO TABS: 1.0000 | ORAL_TABLET | Freq: Four times a day (QID) | ORAL | Status: AC | PRN

## 2011-02-21 MED ORDER — PHENAZOPYRIDINE HCL 200 MG PO TABS: 200.0000 mg | ORAL_TABLET | Freq: Three times a day (TID) | ORAL | Status: AC

## 2011-02-21 NOTE — Discharge Summary (Signed)
Physician Discharge Summary  Patient ID: [name] [name] MRN: [id number] DOB/AGE: May 19, 1966 45 y.o.  Admit date: 02/18/2011 Discharge date: 02/21/2011  Primary Care Physician: Mcgouh  Discharge Diagnoses:   1.Pyelonephritis 2.s/p Splenectomy 3. Gastroesophageal reflux disease  4. Status post splenectomy 5. History of ITP 6. Obesity 7. History of OSA, on CPAP 8. Mildly thickened loops of jejunum in the central abdomen- persisting as noted on previous CT, Follow-up outpatient with Dr Jena Gauss, possible c t enterography outpt may be useful to reassess the jejunum. 9. History of gallbladder polyp. 10? H/O Adrenal insufficiency      Present on Admission:  .Pyelonephritis .Obesity .OSA on CPAP  Current Discharge Medication List    START taking these medications   Details  cefUROXime (CEFTIN) 500 MG tablet Take 1 tablet (500 mg total) by mouth 2 (two) times daily. Qty: 22 tablet, Refills: 0    HYDROcodone-acetaminophen (NORCO) 5-325 MG per tablet Take 1-2 tablets by mouth every 6 (six) hours as needed. Qty: 30 tablet, Refills: 0    phenazopyridine (PYRIDIUM) 200 MG tablet Take 1 tablet (200 mg total) by mouth 3 (three) times daily with meals. Qty: 10 tablet, Refills: 0      CONTINUE these medications which have NOT CHANGED   Details  ALPRAZolam (XANAX) 0.5 MG tablet Take 0.5 mg by mouth 4 (four) times daily as needed.      hydrocortisone (CORTEF) 20 MG tablet Take 20 mg by mouth daily.      levothyroxine (SYNTHROID, LEVOTHROID) 25 MCG tablet Take 25 mcg by mouth daily.      meloxicam (MOBIC) 7.5 MG tablet Take 7.5 mg by mouth daily.      methocarbamol (ROBAXIN) 750 MG tablet Take 750 mg by mouth 2 (two) times daily.     Multiple Vitamins-Minerals (MENS MULTIVITAMIN PLUS) TABS Take 1 tablet by mouth daily.      omeprazole (PRILOSEC) 20 MG capsule Take 20 mg by mouth 2 (two) times daily.      simvastatin (ZOCOR) 20 MG tablet Take 20 mg by mouth at bedtime.        Tamsulosin HCl (FLOMAX) 0.4 MG CAPS Take 0.4 mg by mouth daily.        STOP taking these medications     cephALEXin (KEFLEX) 500 MG capsule          Disposition and Follow-up: Patient is being discharged home in stable condition and will follow up outpatient with his primary care physician and GI in one to 2 weeks as directed.  Consults: NONE  Significant Diagnostic Studies:  Ct Abdomen Pelvis Wo Contrast  02/18/2011  IMPRESSION: 1.  Mildly thickened loops of jejunum in the central abdomen.  This may represent a focal enteritis such as infection or inflammatory bowel disease.  Infiltrating neoplasm such as lymphoma is considered less likely however this thickening has persisted compared to 12/17/2010. 2.  Nonobstructing left interpolar renal calculus and left renal cyst. 3.  Cholecystectomy. 4.  Follow-up outpatient c t enterography may be useful to reassess the jejunum following empiric treatment.  Original Report Authenticated By: Andreas Newport, M.D.   Dg Chest 2 View  02/18/2011  *RADIOLOGY REPORT*  Clinical Data: Chest pain.  CHEST - 2 VIEW  Comparison: 12/17/2010.  Findings: Cardiopericardial silhouette and mediastinal contours are within normal limits.  No airspace disease.  No effusion.  The inferior costophrenic angles are excluded from view on the lateral. Trachea midline. Monitoring leads are projected over the chest.  IMPRESSION: No active cardiopulmonary disease.  Original Report Authenticated By: Andreas Newport, M.D.    Brief H and P: For complete details please refer to admission H and P, but in brief [name] is a 45 year old male with the above listed medical problems and noted to have a poor understanding of his past medical history as per the admitting M.D. Note, who presented with  having myalgias, fever at  home, suprapubic pain with some associated lower back pain, but not  flank pain. He has also been having some dysuria with some urinary  frequency and his urine  has been nonbloody. He says he does not get  kidney infections or bladder infections very often. This has only  happened to him twice before, however, he says he is on the Keflex for  just recurrent infections in his bladder and his upper respiratory  tract. He has been nauseous without any vomiting. He denies any  diarrhea. Denies any abdominal pain. Again, history is very hard to  obtain as he pretty much does not really have a very good insight into  his medical history and problems. He was admitted for further evaluation and management.   Hospital Course:  Active Problems: 1.Pyelonephritis - upon admission blood and urine cultures were obtained and the patient was started on empiric antibiotics with Rocephin. He defervesced on antibiotics and has remained hemodynamically stable. He was noted to have a leukocytosis of 22,000 on admission but  this improved with her antibiotics to it 8.5 today upon discharge. The patient was continued to have some dysuria and Pyridium was added to his treatment regimen and this is improving. His blood and urine cultures are showing no growth to date. The patient is clinically improved at this time and will be discharged for outpatient followup. He is to complete his antibiotic course and follow up with his primary care physician. He has been instructed to hold off that Keflex while taking the antibiotics prescribed upon discharge. 2.s/p Splenectomy 3. Gastroesophageal reflux disease - he had cardiac enzymes done upon admission and given that he reported chest pain and this came back negative. He was maintained on a PPI during this hospital stay and is to continue upon discharge. 4. Status post splenectomy 5. History of ITP 6. Obesity 7. History of OSA, on CPAP- he is to continue CPAP upon discharge. 8. Mildly thickened loops of jejunum in the central abdomen- persisting as noted on previous CT,  He s to follow up with Dr Jena Gauss outpatient, possible c t  enterography outpt may be useful to reassess the jejunum. 9. History of gallbladder polyp. 10? H/O Adrenal insufficiency- the patient was maintained on his oral Cortef during his hospital stay and is to continue this upon discharge.     Time spent on Discharge:   Signed: Kela Millin 02/21/2011, 2:02 PM

## 2011-02-21 NOTE — Progress Notes (Signed)
IV removed, site WNL.  Pt given d/c instructions and new prescriptions.  Discussed home care with patient and discussed home medications, patient verbalizes understanding. F/U appointments in place, pt states they will keep appts. Pt is A&O and stable at this time. Pt taken to main entrance in wheelchair by staff member.

## 2011-02-21 NOTE — Progress Notes (Signed)
I spoke to Dr. Onalee Hua regarding patient's swollen mouth and uvula.  No new orders received.  Dr. Onalee Hua said she make the day shift doctor aware.

## 2011-02-21 NOTE — Progress Notes (Signed)
I paged Dr. Onalee Hua regarding patient c/o swollen uvula and mouth.  Upon examination, the sides of patient's mouth and his uvula appear to be swollen.  Patient reports that he has had trouble with this frequently and he has a prescription for Keflex as needed at home.  Awaiting call back from Dr. Onalee Hua.

## 2011-02-23 LAB — CULTURE, BLOOD (ROUTINE X 2): Culture: NO GROWTH

## 2011-03-01 NOTE — Progress Notes (Signed)
Encounter addended by: Karleen Hampshire on: 03/01/2011  3:21 PM<BR>     Documentation filed: Normajean Glasgow VN

## 2011-03-05 ENCOUNTER — Encounter: Payer: Self-pay | Admitting: Urgent Care

## 2011-03-05 ENCOUNTER — Ambulatory Visit (INDEPENDENT_AMBULATORY_CARE_PROVIDER_SITE_OTHER): Payer: BC Managed Care – PPO | Admitting: Urgent Care

## 2011-03-05 DIAGNOSIS — K760 Fatty (change of) liver, not elsewhere classified: Secondary | ICD-10-CM | POA: Insufficient documentation

## 2011-03-05 DIAGNOSIS — K921 Melena: Secondary | ICD-10-CM

## 2011-03-05 DIAGNOSIS — R933 Abnormal findings on diagnostic imaging of other parts of digestive tract: Secondary | ICD-10-CM

## 2011-03-05 DIAGNOSIS — K7689 Other specified diseases of liver: Secondary | ICD-10-CM

## 2011-03-05 NOTE — Progress Notes (Signed)
Cc to PCP 

## 2011-03-05 NOTE — Assessment & Plan Note (Signed)
[name] [name] is a 45 y.o. male  w/ abnormal thickening jejunum on CT in both 7/12 and 9/12.  Both times he was treated w/ antibiotics for UTI as well.  Has FU w/ urology for this.  Hx intermittent generalized abdominal pain, leukocytosis & hematochezia w/ known hx hemorrhoids.  Last CT was without contrast.  Will further review films w/ Dr Darrick Penna to determine best way to visualize small bowel (jejunum) to r/o IBD & malignancy

## 2011-03-05 NOTE — Assessment & Plan Note (Addendum)
Hx normal LFTs.    Instructions for fatty liver: Recommend 1-2# weight loss per week until ideal body weight through exercise & diet. Low fat/cholesterol diet. Gradually increase exercise from 15 min daily up to 1 hr per day 5 days/week. Limit alcohol use.

## 2011-03-05 NOTE — Progress Notes (Signed)
Referring Provider: Dr. Regino Schultze Primary Care Physician:  Kirk Ruths, MD Primary Gastroenterologist:  Dr. Jena Gauss Endocrinologist:  Dr. Delle Reining  Chief Complaint  Patient presents with  . Follow-up    HPI:  [name] [name] is a 45 y.o. male here to follow-up on an abnormal CT scan.  Was hospitalized w/ UTI, placed on antibiotics in 7/12.  Felt like it had cleared up, went back to hospital last month was dx w/ renal lithiasis & UTI.  Had non-contrast CT which showed thickened jejunum as below.  Hx abnormal small bowel in 7/12 on IV contrast only CT Abdomen/pelvis.  He is sched to see urology.  C/o Entire abdomen sore at times.  Sharp stabbing intermittent pain.  Occ RLQ pain.   Denies any upper GI symptoms including heartburn, indigestion, nausea, vomiting, dysphagia, odynophagia or anorexia.  BM w/ urgency post-prandially w/ loose stools 2-4 per day.  Occ constipation, straining & hard stools.  Has been seeing bright red & burgundy blood in stool in small to mod amts x 1 yr, but has attributed this to hemorrhoids.  Leukocytosis improved w/ antibiotic therapy.   Recent Results (from the past 1008 hour(s))  CBC   Collection Time   02/18/11  6:03 PM      Component Value Range   WBC 22.3 (*) 4.0 - 10.5 (K/uL)   RBC 5.23  4.22 - 5.81 (MIL/uL)   Hemoglobin 14.6  13.0 - 17.0 (g/dL)   HCT 16.1  09.6 - 04.5 (%)   MCV 83.6  78.0 - 100.0 (fL)   MCH 27.9  26.0 - 34.0 (pg)   MCHC 33.4  30.0 - 36.0 (g/dL)   RDW 40.9  81.1 - 91.4 (%)   Platelets 373  150 - 400 (K/uL)  DIFFERENTIAL   Collection Time   02/18/11  6:03 PM      Component Value Range   Neutrophils Relative 82 (*) 43 - 77 (%)   Neutro Abs 18.3 (*) 1.7 - 7.7 (K/uL)   Lymphocytes Relative 9 (*) 12 - 46 (%)   Lymphs Abs 2.0  0.7 - 4.0 (K/uL)   Monocytes Relative 9  3 - 12 (%)   Monocytes Absolute 1.9 (*) 0.1 - 1.0 (K/uL)   Eosinophils Relative 0  0 - 5 (%)   Eosinophils Absolute 0.1  0.0 - 0.7 (K/uL)   Basophils Relative 0  0 - 1  (%)   Basophils Absolute 0.0  0.0 - 0.1 (K/uL)  BASIC METABOLIC PANEL   Collection Time   02/18/11  6:03 PM      Component Value Range   Sodium 136  135 - 145 (mEq/L)   Potassium 3.7  3.5 - 5.1 (mEq/L)   Chloride 100  96 - 112 (mEq/L)   CO2 28  19 - 32 (mEq/L)   Glucose, Bld 105 (*) 70 - 99 (mg/dL)   BUN 10  6 - 23 (mg/dL)   Creatinine, Ser 7.82  0.50 - 1.35 (mg/dL)   Calcium 9.5  8.4 - 95.6 (mg/dL)   GFR calc non Af Amer >60  >60 (mL/min)   GFR calc Af Amer >60  >60 (mL/min)  POCT I-STAT TROPONIN I   Collection Time   02/18/11  6:16 PM      Component Value Range   Troponin i, poc 0.00  0.00 - 0.08 (ng/mL)   Comment 3           URINALYSIS, ROUTINE W REFLEX MICROSCOPIC   Collection Time  02/18/11  6:35 PM      Component Value Range   Color, Urine YELLOW  YELLOW    Appearance HAZY (*) CLEAR    Specific Gravity, Urine 1.015  1.005 - 1.030    pH 7.5  5.0 - 8.0    Glucose, UA NEGATIVE  NEGATIVE (mg/dL)   Hgb urine dipstick SMALL (*) NEGATIVE    Bilirubin Urine NEGATIVE  NEGATIVE    Ketones, ur NEGATIVE  NEGATIVE (mg/dL)   Protein, ur NEGATIVE  NEGATIVE (mg/dL)   Urobilinogen, UA 0.2  0.0 - 1.0 (mg/dL)   Nitrite NEGATIVE  NEGATIVE    Leukocytes, UA TRACE (*) NEGATIVE   URINE CULTURE   Collection Time   02/18/11  6:35 PM      Component Value Range   Specimen Description URINE, CATHETERIZED     Special Requests NONE     Setup Time [id number]     Colony Count NO GROWTH     Culture NO GROWTH     Report Status 02/20/2011 FINAL    URINE MICROSCOPIC-ADD ON   Collection Time   02/18/11  6:35 PM      Component Value Range   WBC, UA 11-20  <3 (WBC/hpf)   RBC / HPF 7-10  <3 (RBC/hpf)   Bacteria, UA MANY (*) RARE   CULTURE, BLOOD (ROUTINE X 2)   Collection Time   02/18/11  8:03 PM      Component Value Range   Specimen Description BLOOD RIGHT ANTECUBITAL     Special Requests BOTTLES DRAWN AEROBIC AND ANAEROBIC 6CC     Culture NO GROWTH 5 DAYS     Report Status 02/23/2011 FINAL     CORTISOL   Collection Time   02/18/11  8:04 PM      Component Value Range   Cortisol, Plasma 11.5    CULTURE, BLOOD (ROUTINE X 2)   Collection Time   02/18/11  8:09 PM      Component Value Range   Specimen Description BLOOD LEFT HAND     Special Requests       Value: BOTTLES DRAWN AEROBIC AND ANAEROBIC 6CC EACH BOTTLE   Culture NO GROWTH 5 DAYS     Report Status 02/23/2011 FINAL    BASIC METABOLIC PANEL   Collection Time   02/19/11  4:39 AM      Component Value Range   Sodium 137  135 - 145 (mEq/L)   Potassium 3.5  3.5 - 5.1 (mEq/L)   Chloride 104  96 - 112 (mEq/L)   CO2 25  19 - 32 (mEq/L)   Glucose, Bld 164 (*) 70 - 99 (mg/dL)   BUN 13  6 - 23 (mg/dL)   Creatinine, Ser 5.28  0.50 - 1.35 (mg/dL)   Calcium 8.7  8.4 - 41.3 (mg/dL)   GFR calc non Af Amer >60  >60 (mL/min)   GFR calc Af Amer >60  >60 (mL/min)  CBC   Collection Time   02/19/11  4:39 AM      Component Value Range   WBC 20.8 (*) 4.0 - 10.5 (K/uL)   RBC 4.78  4.22 - 5.81 (MIL/uL)   Hemoglobin 13.2  13.0 - 17.0 (g/dL)   HCT 24.4  01.0 - 27.2 (%)   MCV 83.9  78.0 - 100.0 (fL)   MCH 27.6  26.0 - 34.0 (pg)   MCHC 32.9  30.0 - 36.0 (g/dL)   RDW 53.6  64.4 - 03.4 (%)   Platelets 358  150 -  400 (K/uL)  CBC   Collection Time   02/21/11  4:35 AM      Component Value Range   WBC 8.5  4.0 - 10.5 (K/uL)   RBC 5.02  4.22 - 5.81 (MIL/uL)   Hemoglobin 13.7  13.0 - 17.0 (g/dL)   HCT 16.1  09.6 - 04.5 (%)   MCV 83.5  78.0 - 100.0 (fL)   MCH 27.3  26.0 - 34.0 (pg)   MCHC 32.7  30.0 - 36.0 (g/dL)   RDW 40.9  81.1 - 91.4 (%)   Platelets 432 (*) 150 - 400 (K/uL)  BASIC METABOLIC PANEL   Collection Time   02/21/11  4:35 AM      Component Value Range   Sodium 138  135 - 145 (mEq/L)   Potassium 3.6  3.5 - 5.1 (mEq/L)   Chloride 102  96 - 112 (mEq/L)   CO2 26  19 - 32 (mEq/L)   Glucose, Bld 117 (*) 70 - 99 (mg/dL)   BUN 11  6 - 23 (mg/dL)   Creatinine, Ser 7.82  0.50 - 1.35 (mg/dL)   Calcium 8.7  8.4 - 95.6 (mg/dL)    GFR calc non Af Amer >60  >60 (mL/min)   GFR calc Af Amer >60  >60 (mL/min)  Comparison of recent CTs-  02/18/11 CT a/p WITHOUT Contrast->Unchanged left renal cyst.  2 mm nonobstructing left renal collecting system calculus.  Calcified phleboliths are present in the anatomic pelvis.  Mildly thickened loops of small bowel are present within the central abdomen without evidence of obstruction or mesenteric inflammation.  This can be associated with inflammatory bowel disease, infectious enteritis or infiltrating bowel neoplasm. There is no lymphadenopathy.   Follow-up outpatient CT enterography may be useful to reassess the jejunum following empiric treatment.  12/17/10 CT abd/pelvis w/ IV contrast only->Several mildly dilated loops of jejunum in the left abdomen demonstrate air fluid levels, probably reflecting enteritis, Mild hepatic steatosis, spleen is absent.   02/15/10 CT ABD/pelvis w/ IV contrast only->normal except probable fatty infiltration of the liver.  Past Medical History  Diagnosis Date  . Sleep apnea     cpap  . GERD (gastroesophageal reflux disease)     erosive reflux esophagitis, small HH  . Thyroid disease   . Back pain, chronic 02/18/11    BULGING DISK  . IBS (irritable bowel syndrome)   . Hemorrhoids   . ITP (idiopathic thrombocytopenic purpura)   . S/P colonoscopy 08/2008    int/ext hemorrhoids. friable, rectal polyp  . Tubular adenoma of colon 2000  . Hyperlipemia   . Hypertension   . S/P endoscopy 01/10/09    Dr Elmer Ramp, small HH  . Low serum cortisol level     Dr Horald Pollen  . Kidney stone   . UTI (lower urinary tract infection)   . Lower extremity edema   . Plantar fasciitis     Past Surgical History  Procedure Date  . Splenectomy 2003  . Cholecystectomy 2006  . Knee surgery     left    Current Outpatient Prescriptions  Medication Sig Dispense Refill  . ALPRAZolam (XANAX) 0.5 MG tablet Take 0.5 mg by mouth 4 (four) times daily as needed.        .  ciprofloxacin (CIPRO) 500 MG tablet Take 500 mg by mouth 2 (two) times daily.       . hydrocortisone (CORTEF) 20 MG tablet Take 20 mg by mouth daily.        . meloxicam (  MOBIC) 7.5 MG tablet Take 7.5 mg by mouth daily.        . methocarbamol (ROBAXIN) 750 MG tablet Take 750 mg by mouth 2 (two) times daily.       . Multiple Vitamins-Minerals (MENS MULTIVITAMIN PLUS) TABS Take 1 tablet by mouth daily.        Marland Kitchen omeprazole (PRILOSEC) 20 MG capsule Take 20 mg by mouth 2 (two) times daily.        . simvastatin (ZOCOR) 20 MG tablet Take 20 mg by mouth at bedtime.        . Tamsulosin HCl (FLOMAX) 0.4 MG CAPS Take 0.4 mg by mouth daily.          Allergies as of 03/05/2011 - Review Complete 03/05/2011  Allergen Reaction Noted  . Hydromorphone hcl Other (See Comments)     Family History  Problem Relation Age of Onset  . Colon cancer Father 46  . Diabetes Brother     History   Social History  . Marital Status: Married    Spouse Name: N/A    Number of Children: 1  . Years of Education: N/A   Occupational History  . truck driver-local     Social History Main Topics  . Smoking status: Former Smoker -- 1.0 packs/day for 30 years    Types: Cigarettes    Quit date: 03/05/2007  . Smokeless tobacco: Not on file  . Alcohol Use: No     hx heavy etoh, quit 4 y ago  . Drug Use: No     quit 4ys ago, chronic marijuana  . Sexually Active: Not on file   Other Topics Concern  . Not on file   Social History Narrative   1 healthy son    Review of Systems: Gen: Denies any fever, chills, sweats, anorexia, fatigue, weakness, malaise, weight loss, and sleep disorder CV: Denies chest pain, angina, palpitations, syncope, orthopnea, PND.  Has some peripheral edema.   Resp: Denies dyspnea at rest, dyspnea with exercise, cough, sputum, wheezing, coughing up blood, and pleurisy. GI: Denies vomiting blood, jaundice, and fecal incontinence.   Denies dysphagia or odynophagia. GU : See HPI. MS: Denies  joint pain, limitation of movement, and swelling, stiffness, low back pain, extremity pain. Denies muscle weakness, cramps, atrophy.  Derm: Denies rash, itching, dry skin, hives, moles, warts, or unhealing ulcers.  Psych: Denies depression, anxiety, memory loss, suicidal ideation, hallucinations, paranoia, and confusion. Heme: Denies bruising, bleeding, and enlarged lymph nodes.  Physical Exam: BP 134/72  Pulse 101  Temp(Src) 98.4 F (36.9 C) (Temporal)  Ht 5\' 7"  (1.702 m)  Wt 308 lb 6.4 oz (139.889 kg)  BMI 48.30 kg/m2 General:   Alert,  Well-developed, obese, pleasant and cooperative in NAD Head:  Normocephalic and atraumatic. Eyes:  Sclera clear, no icterus.   Conjunctiva pink. Ears:  Normal auditory acuity. Nose:  No deformity, discharge,  or lesions. Mouth:  No deformity or lesions, OP pink/moist. Neck:  Supple; no masses or thyromegaly. Lungs:  Clear throughout to auscultation.   No wheezes, crackles, or rhonchi. No acute distress. Heart:  Regular rate and rhythm; no murmurs, clicks, rubs,  or gallops. Abdomen:  Soft, nontender and nondistended. No masses, hepatosplenomegaly or hernias noted. Normal bowel sounds, without guarding, and without rebound.   Rectal:  Deferred. Msk:  Symmetrical without gross deformities. Normal posture. Pulses:  Normal pulses noted. Extremities:  Without clubbing or edema. Neurologic:  Alert and  oriented x4;  grossly normal neurologically. Skin:  Intact  without significant lesions or rashes. Cervical Nodes:  No significant cervical adenopathy. Psych:  Alert and cooperative. Normal mood and affect.

## 2011-03-05 NOTE — Patient Instructions (Addendum)
Instructions for fatty liver: Recommend 1-2# weight loss per week until ideal body weight through exercise & diet. Low fat/cholesterol diet. Gradually increase exercise from 15 min daily up to 1 hr per day 5 days/week. Limit alcohol use.  I will review CT scans w/ Dr Darrick Penna & call you back w/ recommendations on how to look at your small bowel better.

## 2011-03-05 NOTE — Assessment & Plan Note (Signed)
Possibly secondary to hemorrhoids.  Cannot r/o small bowel process given recent CT findings at this time.  Family hx of colon ca & personal hx of tubular adenoma.  May need repeat colonoscopy as well.

## 2011-03-07 NOTE — Progress Notes (Signed)
REVIEWED. AGREE.  Pt needs SB imaging would discuss with Dr. Jena Gauss when he returns.

## 2011-03-08 NOTE — Progress Notes (Signed)
Discussed with Dr. Jena Gauss. He recommends MRE to further evaluate small bowel. Will also reviewed most recent CT to evaluate mesentery.

## 2011-03-12 ENCOUNTER — Telehealth: Payer: Self-pay | Admitting: Internal Medicine

## 2011-03-12 NOTE — Telephone Encounter (Signed)
Routed to [facility]

## 2011-03-12 NOTE — Telephone Encounter (Signed)
Pt was seen last Monday and was told to call us back if he hasn't heard from [place] by Wednesday. Pt was out of town and is following up with KJ about CT and what his next step would be. Please return his call at [phone] or [phone]

## 2011-03-12 NOTE — Telephone Encounter (Signed)
Please call pt back.   I discussed w/ Dr Jena Gauss & he would like pt to have an MRI to look at this area in his small bowel. I need to speak w/ the radiologist to get this set up prior to arranging. He should hear something from Crystal in the next day or 2 to get set up. Thanks

## 2011-03-12 NOTE — Telephone Encounter (Signed)
Pt aware, he is requesting to have it done on 10/30 in the am d/t pt has appointment with another doctor that afternoon. He said if he cant do it that day, he prefers either a Monday or a Thursday.

## 2011-03-12 NOTE — Progress Notes (Signed)
Reviewed CT A/P w/ contrast w/ Dr Tyron Russell who noted no problems/plaque w/ celiac, SMA, IMA. Pt needs MR enterography to evaluate abnormal small bowel on previous 2 CTs. Please correlate w/ old films Please arrange Thanks

## 2011-03-12 NOTE — Telephone Encounter (Signed)
Will fwd to [employer]

## 2011-03-13 ENCOUNTER — Other Ambulatory Visit: Payer: Self-pay | Admitting: Internal Medicine

## 2011-03-13 DIAGNOSIS — R935 Abnormal findings on diagnostic imaging of other abdominal regions, including retroperitoneum: Secondary | ICD-10-CM

## 2011-03-13 NOTE — Telephone Encounter (Signed)
PT SCHEDULED FOR 04/03/11 PER HIS REQUEST

## 2011-03-19 LAB — TSH: TSH: 0.922

## 2011-03-19 LAB — LIPID PANEL
Cholesterol: 263 — ABNORMAL HIGH
Total CHOL/HDL Ratio: 10.1
VLDL: 61 — ABNORMAL HIGH

## 2011-03-19 LAB — CARDIAC PANEL(CRET KIN+CKTOT+MB+TROPI)
CK, MB: 1.4
Relative Index: 1.1
Total CK: 126
Total CK: 139
Troponin I: 0.04

## 2011-03-19 LAB — RAPID URINE DRUG SCREEN, HOSP PERFORMED
Barbiturates: POSITIVE — AB
Opiates: POSITIVE — AB

## 2011-03-20 LAB — DIFFERENTIAL
Basophils Absolute: 0
Eosinophils Relative: 2
Lymphocytes Relative: 31
Lymphs Abs: 2.9
Monocytes Absolute: 0.9 — ABNORMAL HIGH
Neutro Abs: 5.2

## 2011-03-20 LAB — D-DIMER, QUANTITATIVE: D-Dimer, Quant: 0.22

## 2011-03-20 LAB — HEPATIC FUNCTION PANEL
AST: 25
Bilirubin, Direct: <0.1
Total Bilirubin: 0.7

## 2011-03-20 LAB — CBC
HCT: 47.6
Hemoglobin: 15.7
WBC: 9.2

## 2011-03-20 LAB — POCT CARDIAC MARKERS
CKMB, poc: <1 — ABNORMAL LOW
Myoglobin, poc: 78.2
Troponin i, poc: <0.05

## 2011-03-20 LAB — BASIC METABOLIC PANEL
Calcium: 8.8
GFR calc Af Amer: >60
GFR calc non Af Amer: >60
Potassium: 3.7
Sodium: 137

## 2011-04-03 ENCOUNTER — Telehealth: Payer: Self-pay

## 2011-04-03 ENCOUNTER — Ambulatory Visit (HOSPITAL_COMMUNITY)
Admission: RE | Admit: 2011-04-03 | Discharge: 2011-04-03 | Disposition: A | Discharge status: Home / Self Care | Payer: BC Managed Care – PPO | Source: Ambulatory Visit | Attending: Internal Medicine | Admitting: Internal Medicine

## 2011-04-03 ENCOUNTER — Ambulatory Visit (INDEPENDENT_AMBULATORY_CARE_PROVIDER_SITE_OTHER): Payer: BC Managed Care – PPO | Admitting: Urology

## 2011-04-03 DIAGNOSIS — R3915 Urgency of urination: Secondary | ICD-10-CM

## 2011-04-03 DIAGNOSIS — N390 Urinary tract infection, site not specified: Secondary | ICD-10-CM

## 2011-04-03 DIAGNOSIS — N281 Cyst of kidney, acquired: Secondary | ICD-10-CM

## 2011-04-03 DIAGNOSIS — R39198 Other difficulties with micturition: Secondary | ICD-10-CM

## 2011-04-03 DIAGNOSIS — R935 Abnormal findings on diagnostic imaging of other abdominal regions, including retroperitoneum: Secondary | ICD-10-CM

## 2011-04-03 DIAGNOSIS — N200 Calculus of kidney: Secondary | ICD-10-CM

## 2011-04-03 NOTE — Telephone Encounter (Signed)
Ok great.  Please call pt & let him know that Crystal will be arranging, but the plan is to go to [place]. OK to eat & return to activities. Thanks

## 2011-04-03 NOTE — Telephone Encounter (Signed)
[name] [name] from radiology called- pt is too large for MRI at [facility]. They will either have to send him home or do a CT enterography. Spoke with KJ- MRI is preferred. Ok to send pt home and we will see about getting him scheduled in [place] in the open MRI.   Please schedule.

## 2011-04-03 NOTE — Telephone Encounter (Signed)
Discussed w/ patient. Needs appt on a MON or THURS after 1pm Please arrange Thanks

## 2011-04-03 NOTE — Telephone Encounter (Signed)
Pt called to see if we were going to [facility] his MRI today since he couldn't have it done at [facility] and when could he eat. I told pt that nurse said he could eat today and we would get him [facility] in [place] and we would call him to let him know of date and time. Pt would like a return call before [facility] MRI so he can arrange plans with his work.  [phone]

## 2011-04-03 NOTE — Telephone Encounter (Signed)
Let me know what [facility] says.

## 2011-04-03 NOTE — Telephone Encounter (Signed)
Open MRI at [facility] will hold up to 440lbs.

## 2011-04-03 NOTE — Telephone Encounter (Signed)
[name] [name] asked you to please call OPEN MRI in [place] TODAY to see if pt's weight can be accomadated there for MR enterography. Thank you

## 2011-04-04 ENCOUNTER — Other Ambulatory Visit: Payer: Self-pay | Admitting: Internal Medicine

## 2011-04-04 DIAGNOSIS — R935 Abnormal findings on diagnostic imaging of other abdominal regions, including retroperitoneum: Secondary | ICD-10-CM

## 2011-04-04 NOTE — Telephone Encounter (Signed)
PT IS SCHEDULED FOR 04/12/11 @ [facility] - HE IS AWARE OF APPT

## 2011-04-12 ENCOUNTER — Ambulatory Visit
Admission: RE | Admit: 2011-04-12 | Discharge: 2011-04-12 | Disposition: A | Discharge status: Home / Self Care | Payer: BC Managed Care – PPO | Source: Ambulatory Visit | Attending: Internal Medicine | Admitting: Internal Medicine

## 2011-04-12 DIAGNOSIS — R935 Abnormal findings on diagnostic imaging of other abdominal regions, including retroperitoneum: Secondary | ICD-10-CM

## 2011-04-12 MED ORDER — GADOBENATE DIMEGLUMINE 529 MG/ML IV SOLN
20.0000 mL | Freq: Once | INTRAVENOUS | Status: AC | PRN
  Administered 2011-04-12: 20 mL via INTRAVENOUS

## 2011-04-16 ENCOUNTER — Telehealth: Payer: Self-pay | Admitting: Urgent Care

## 2011-04-16 NOTE — Telephone Encounter (Signed)
Pt aware, please schedule appt with [facility].

## 2011-04-16 NOTE — Telephone Encounter (Signed)
Results Cc to PCP  

## 2011-04-16 NOTE — Telephone Encounter (Signed)
LMOM for return call  You may let patient know his MRI is completely normal. I suspect he had an infection to account for his previous findings. He will need office visit with Dr. Jena Gauss only in 6-8 weeks pending he is doing well. Thanks

## 2011-04-19 NOTE — Telephone Encounter (Signed)
appt on sched

## 2011-05-31 ENCOUNTER — Encounter: Payer: Self-pay | Admitting: General Practice

## 2011-06-11 ENCOUNTER — Ambulatory Visit: Payer: BC Managed Care – PPO | Admitting: Urgent Care

## 2011-06-12 ENCOUNTER — Ambulatory Visit: Payer: BC Managed Care – PPO | Admitting: Urgent Care

## 2011-06-28 ENCOUNTER — Ambulatory Visit (INDEPENDENT_AMBULATORY_CARE_PROVIDER_SITE_OTHER): Payer: BC Managed Care – PPO | Admitting: Gastroenterology

## 2011-06-28 ENCOUNTER — Encounter: Payer: Self-pay | Admitting: Gastroenterology

## 2011-06-28 VITALS — BP 143/88 | HR 93 | Temp 98.2°F | Ht 67.0 in | Wt 319.0 lb

## 2011-06-28 DIAGNOSIS — D369 Benign neoplasm, unspecified site: Secondary | ICD-10-CM

## 2011-06-28 DIAGNOSIS — K7689 Other specified diseases of liver: Secondary | ICD-10-CM

## 2011-06-28 DIAGNOSIS — K760 Fatty (change of) liver, not elsewhere classified: Secondary | ICD-10-CM

## 2011-06-28 DIAGNOSIS — R933 Abnormal findings on diagnostic imaging of other parts of digestive tract: Secondary | ICD-10-CM

## 2011-06-28 DIAGNOSIS — R109 Unspecified abdominal pain: Secondary | ICD-10-CM

## 2011-06-28 NOTE — Patient Instructions (Signed)
Start taking a probiotic daily, which can be bought over-the-counter. Some examples are Align, Restora, Digestive Health, etc.   Follow a high fiber, low fat diet. Plan your meals ahead of time so that you are not tempted to eat the wrong things. Pay attention to portion sizes and calories listed on menus. A lot of restaurants will provide you with this information.  We will see you back in 6 months or sooner if needed!

## 2011-06-28 NOTE — Progress Notes (Signed)
Referring Provider: Kirk Ruths, MD Primary Care Physician:  Kirk Ruths, MD, MD Primary Gastroenterologist: Dr. Jena Gauss  Chief Complaint  Patient presents with  . Follow-up    HPI:   [name] [name] presents today in follow-up. Underwent MRI due to abnormal small bowel on CT scan. Normal. Reports sharp, LLQ intermittent pain, almost "near pelvis" per pt, lasts about 2 seconds then goes away. Hasn't happened in a few weeks. No association with bowel movements. No constipation or diarrhea. Notes scant intermittent hematochezia.  Long discussion regarding fatty liver implications, weight loss, appropriate diet, exercise. Spent almost 30 min. Pt very interested in dietary changes and wt loss. Has friend at gym who will work with him for meal planning.   Past Medical History  Diagnosis Date  . Sleep apnea     cpap  . GERD (gastroesophageal reflux disease)     erosive reflux esophagitis, small HH  . Thyroid disease   . Back pain, chronic 02/18/11    BULGING DISK  . IBS (irritable bowel syndrome)   . Hemorrhoids   . ITP (idiopathic thrombocytopenic purpura)   . S/P colonoscopy 08/2008    int/ext hemorrhoids. friable, rectal polyp  . Tubular adenoma of colon 2000  . Hyperlipemia   . Hypertension   . S/P endoscopy 01/10/09    Dr Elmer Ramp, small HH  . Low serum cortisol level     Dr Horald Pollen  . Kidney stone   . UTI (lower urinary tract infection)   . Lower extremity edema   . Plantar fasciitis     Past Surgical History  Procedure Date  . Splenectomy 2003  . Cholecystectomy 2006  . Knee surgery     left    Current Outpatient Prescriptions  Medication Sig Dispense Refill  . ALPRAZolam (XANAX) 0.5 MG tablet Take 0.5 mg by mouth 4 (four) times daily as needed.        . hydrocortisone (CORTEF) 20 MG tablet Take 20 mg by mouth daily.        . meloxicam (MOBIC) 7.5 MG tablet Take 7.5 mg by mouth daily.        . methocarbamol (ROBAXIN) 750 MG tablet Take 750 mg by mouth 2 (two)  times daily.       . Multiple Vitamins-Minerals (MENS MULTIVITAMIN PLUS) TABS Take 1 tablet by mouth daily.        Marland Kitchen omeprazole (PRILOSEC) 20 MG capsule Take 20 mg by mouth 2 (two) times daily.        . simvastatin (ZOCOR) 20 MG tablet Take 20 mg by mouth at bedtime.        . Tamsulosin HCl (FLOMAX) 0.4 MG CAPS Take 0.4 mg by mouth daily.        . ciprofloxacin (CIPRO) 500 MG tablet Take 500 mg by mouth 2 (two) times daily.         Allergies as of 06/28/2011 - Review Complete 06/28/2011  Allergen Reaction Noted  . Hydromorphone hcl Other (See Comments)     Family History  Problem Relation Age of Onset  . Colon cancer Father 69  . Diabetes Brother     History   Social History  . Marital Status: Married    Spouse Name: N/A    Number of Children: 1  . Years of Education: N/A   Occupational History  . truck driver-local     Social History Main Topics  . Smoking status: Former Smoker -- 1.0 packs/day for 30 years    Types: Cigarettes  Quit date: 03/05/2007  . Smokeless tobacco: None  . Alcohol Use: No     hx heavy etoh, quit 4 y ago  . Drug Use: No     quit 4ys ago, chronic marijuana  . Sexually Active: None   Other Topics Concern  . None   Social History Narrative   1 healthy son    Review of Systems: Gen: Denies fever, chills, anorexia. Denies fatigue, weakness, weight loss.  CV: Denies chest pain, palpitations, syncope, peripheral edema, and claudication. Resp: Denies dyspnea at rest, cough, wheezing, coughing up blood, and pleurisy. GI: Denies vomiting blood, jaundice, and fecal incontinence.   Denies dysphagia or odynophagia. Derm: Denies rash, itching, dry skin Psych: Denies depression, anxiety, memory loss, confusion. No homicidal or suicidal ideation.  Heme: Denies bruising, bleeding, and enlarged lymph nodes.  Physical Exam: BP 143/88  Pulse 93  Temp(Src) 98.2 F (36.8 C) (Temporal)  Ht 5\' 7"  (1.702 m)  Wt 319 lb (144.697 kg)  BMI 49.96  kg/m2 General:   Alert and oriented. No distress noted. Pleasant and cooperative.  Head:  Normocephalic and atraumatic. Eyes:  Conjuctiva clear without scleral icterus. Mouth:  Oral mucosa pink and moist. Good dentition. No lesions. Neck:  Supple, without mass or thyromegaly. Heart:  S1, S2 present without murmurs, rubs, or gallops. Regular rate and rhythm. Abdomen:  +BS, soft, non-tender and non-distended. No rebound or guarding. No HSM or masses noted. Msk:  Symmetrical without gross deformities. Normal posture. Pulses:  2+ DP noted bilaterally Extremities:  Without edema. Neurologic:  Alert and  oriented x4;  grossly normal neurologically. Skin:  Intact without significant lesions or rashes. Cervical Nodes:  No significant cervical adenopathy. Psych:  Alert and cooperative. Normal mood and affect.

## 2011-06-29 DIAGNOSIS — D369 Benign neoplasm, unspecified site: Secondary | ICD-10-CM | POA: Insufficient documentation

## 2011-06-29 NOTE — Assessment & Plan Note (Signed)
Surveillance 2015.  

## 2011-06-29 NOTE — Assessment & Plan Note (Signed)
Extensive counseling done at time of appt. Low-fat diet, exercise discussed. 6 mos f/u

## 2011-06-29 NOTE — Assessment & Plan Note (Signed)
LLQ "stabbing" pain lasting only a few seconds. 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 Return in 6 mos or sooner if needed.

## 2011-06-29 NOTE — Assessment & Plan Note (Signed)
MRI normal. No further imaging needed.

## 2011-07-02 NOTE — Progress Notes (Signed)
Faxed to PCP

## 2011-08-19 ENCOUNTER — Encounter (HOSPITAL_COMMUNITY): Payer: Self-pay | Admitting: Emergency Medicine

## 2011-08-19 ENCOUNTER — Emergency Department (HOSPITAL_COMMUNITY)
Admission: EM | Admit: 2011-08-19 | Discharge: 2011-08-20 | Disposition: A | Discharge status: Other Acute Inpatient Hospital | Payer: BC Managed Care – PPO | Attending: Emergency Medicine | Admitting: Emergency Medicine

## 2011-08-19 DIAGNOSIS — F489 Nonpsychotic mental disorder, unspecified: Secondary | ICD-10-CM | POA: Insufficient documentation

## 2011-08-19 DIAGNOSIS — I10 Essential (primary) hypertension: Secondary | ICD-10-CM | POA: Insufficient documentation

## 2011-08-19 DIAGNOSIS — E785 Hyperlipidemia, unspecified: Secondary | ICD-10-CM | POA: Insufficient documentation

## 2011-08-19 DIAGNOSIS — IMO0002 Reserved for concepts with insufficient information to code with codable children: Secondary | ICD-10-CM | POA: Insufficient documentation

## 2011-08-19 DIAGNOSIS — Z79899 Other long term (current) drug therapy: Secondary | ICD-10-CM | POA: Insufficient documentation

## 2011-08-19 DIAGNOSIS — R45851 Suicidal ideations: Secondary | ICD-10-CM

## 2011-08-19 LAB — COMPREHENSIVE METABOLIC PANEL
ALT: 33 U/L (ref 0–53)
Albumin: 4.2 g/dL (ref 3.5–5.2)
Alkaline Phosphatase: 76 U/L (ref 39–117)
Potassium: 4.4 mEq/L (ref 3.5–5.1)
Sodium: 140 mEq/L (ref 135–145)
Total Protein: 8.2 g/dL (ref 6.0–8.3)

## 2011-08-19 LAB — ETHANOL: Alcohol, Ethyl (B): <11 mg/dL (ref 0–11)

## 2011-08-19 LAB — RAPID URINE DRUG SCREEN, HOSP PERFORMED
Amphetamines: NOT DETECTED
Barbiturates: NOT DETECTED
Benzodiazepines: POSITIVE — AB

## 2011-08-19 LAB — CBC
MCHC: 33.3 g/dL (ref 30.0–36.0)
RDW: 14.8 % (ref 11.5–15.5)

## 2011-08-19 MED ORDER — ACETAMINOPHEN 325 MG PO TABS: 650.0000 mg | ORAL_TABLET | ORAL | Status: DC | PRN

## 2011-08-19 MED ORDER — ONDANSETRON HCL 4 MG PO TABS: 4.0000 mg | ORAL_TABLET | Freq: Three times a day (TID) | ORAL | Status: DC | PRN

## 2011-08-19 MED ORDER — NICOTINE 21 MG/24HR TD PT24
21.0000 mg | MEDICATED_PATCH | Freq: Every day | TRANSDERMAL | Status: DC
  Filled 2011-08-19 (×2): qty 1

## 2011-08-19 MED ORDER — LORAZEPAM 1 MG PO TABS: 1.0000 mg | ORAL_TABLET | Freq: Three times a day (TID) | ORAL | Status: DC | PRN

## 2011-08-19 MED ORDER — ALUM & MAG HYDROXIDE-SIMETH 200-200-20 MG/5ML PO SUSP: 30.0000 mL | ORAL | Status: DC | PRN

## 2011-08-19 MED ORDER — CITALOPRAM HYDROBROMIDE 10 MG PO TABS
10.0000 mg | ORAL_TABLET | Freq: Every day | ORAL | Status: DC
  Administered 2011-08-20: 10 mg via ORAL
  Filled 2011-08-19 (×3): qty 1

## 2011-08-19 MED ORDER — ZOLPIDEM TARTRATE 5 MG PO TABS: 5.0000 mg | ORAL_TABLET | Freq: Every evening | ORAL | Status: DC | PRN
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
## 2011-08-20 NOTE — ED Notes (Signed)
Pt assisted to the bathroom and given a wash cloth to wash his face.

## 2011-08-20 NOTE — Progress Notes (Signed)
Contacted [facility] in [place]. Mr. Milnes was declined by by Dr. Les Pou. The reason for the decline was medical acuity of the patient.             Called [facility] and left message regarding th status of the patient.      Called [facility] to verify patient status on the wait list. No beds available at this time.

## 2011-08-20 NOTE — ED Notes (Signed)
Pt resting comfortably, reading at this time.  No c/o of needs voiced at this time.

## 2011-08-20 NOTE — Progress Notes (Signed)
Spoke with [name] [name] at [facility] and pt s accepted to Dr Dan Humphreys room [phone].  See support paperwork.

## 2011-08-20 NOTE — ED Notes (Signed)
Pt states he does not smoke and refuses the nicotine patch at this time. Nicotine patch was discontinued for reason stated above.

## 2011-08-20 NOTE — ED Notes (Signed)
Pt informed of transfer to [facility] unit room 505-2. Pt verbalized understanding.

## 2011-08-20 NOTE — ED Notes (Signed)
Pt is calm, cooperative and conversing with family members. No needs voiced at this time.

## 2011-08-20 NOTE — ED Notes (Signed)
Pt given breakfast tray and sitter at bedside.

## 2011-08-20 NOTE — ED Notes (Signed)
Pt transported via [employer] office. All belongings transferred with said pt.

## 2011-08-21 ENCOUNTER — Inpatient Hospital Stay (HOSPITAL_COMMUNITY)
Admission: AD | Admit: 2011-08-21 | Discharge: 2011-08-23 | DRG: 430 | Disposition: A | Discharge status: Home / Self Care | Payer: BC Managed Care – PPO | Source: Ambulatory Visit | Attending: Psychiatry | Admitting: Psychiatry

## 2011-08-21 ENCOUNTER — Encounter (HOSPITAL_COMMUNITY): Payer: Self-pay

## 2011-08-21 DIAGNOSIS — E079 Disorder of thyroid, unspecified: Secondary | ICD-10-CM

## 2011-08-21 DIAGNOSIS — K219 Gastro-esophageal reflux disease without esophagitis: Secondary | ICD-10-CM

## 2011-08-21 DIAGNOSIS — F329 Major depressive disorder, single episode, unspecified: Secondary | ICD-10-CM | POA: Diagnosis present

## 2011-08-21 DIAGNOSIS — G4730 Sleep apnea, unspecified: Secondary | ICD-10-CM

## 2011-08-21 DIAGNOSIS — E785 Hyperlipidemia, unspecified: Secondary | ICD-10-CM

## 2011-08-21 DIAGNOSIS — K589 Irritable bowel syndrome without diarrhea: Secondary | ICD-10-CM

## 2011-08-21 DIAGNOSIS — Z888 Allergy status to other drugs, medicaments and biological substances status: Secondary | ICD-10-CM

## 2011-08-21 DIAGNOSIS — R45851 Suicidal ideations: Secondary | ICD-10-CM

## 2011-08-21 DIAGNOSIS — Z860100 Personal history of colon polyps, unspecified: Secondary | ICD-10-CM

## 2011-08-21 DIAGNOSIS — F339 Major depressive disorder, recurrent, unspecified: Principal | ICD-10-CM

## 2011-08-21 DIAGNOSIS — F32A Depression, unspecified: Secondary | ICD-10-CM | POA: Diagnosis present

## 2011-08-21 DIAGNOSIS — Z79899 Other long term (current) drug therapy: Secondary | ICD-10-CM

## 2011-08-21 LAB — URINE MICROSCOPIC-ADD ON

## 2011-08-21 LAB — URINALYSIS, ROUTINE W REFLEX MICROSCOPIC
Ketones, ur: NEGATIVE mg/dL
Leukocytes, UA: NEGATIVE
Nitrite: NEGATIVE
Protein, ur: NEGATIVE mg/dL

## 2011-08-21 MED ORDER — ACETAMINOPHEN 325 MG PO TABS: 650.0000 mg | ORAL_TABLET | ORAL | Status: DC | PRN

## 2011-08-21 MED ORDER — TAMSULOSIN HCL 0.4 MG PO CAPS
0.4000 mg | ORAL_CAPSULE | Freq: Every day | ORAL | Status: DC
  Administered 2011-08-21 – 2011-08-23 (×3): 0.4 mg via ORAL
  Filled 2011-08-21 (×5): qty 1

## 2011-08-21 MED ORDER — LORAZEPAM 1 MG PO TABS: 1.0000 mg | ORAL_TABLET | Freq: Three times a day (TID) | ORAL | Status: DC | PRN

## 2011-08-21 MED ORDER — ALUM & MAG HYDROXIDE-SIMETH 200-200-20 MG/5ML PO SUSP: 30.0000 mL | ORAL | Status: DC | PRN

## 2011-08-21 MED ORDER — MELOXICAM 7.5 MG PO TABS
7.5000 mg | ORAL_TABLET | Freq: Every day | ORAL | Status: DC
  Administered 2011-08-21 – 2011-08-23 (×3): 7.5 mg via ORAL
  Filled 2011-08-21 (×5): qty 1

## 2011-08-21 MED ORDER — ONDANSETRON HCL 4 MG PO TABS: 4.0000 mg | ORAL_TABLET | Freq: Three times a day (TID) | ORAL | Status: DC | PRN

## 2011-08-21 MED ORDER — FUROSEMIDE 40 MG PO TABS
40.0000 mg | ORAL_TABLET | Freq: Every day | ORAL | Status: DC
  Administered 2011-08-21 – 2011-08-23 (×3): 40 mg via ORAL
  Filled 2011-08-21 (×2): qty 1
  Filled 2011-08-21: qty 7
  Filled 2011-08-21 (×3): qty 1

## 2011-08-21 MED ORDER — ALUM & MAG HYDROXIDE-SIMETH 200-200-20 MG/5ML PO SUSP
30.0000 mL | ORAL | Status: DC | PRN
  Administered 2011-08-21: 30 mL via ORAL

## 2011-08-21 MED ORDER — PANTOPRAZOLE SODIUM 40 MG PO TBEC
40.0000 mg | DELAYED_RELEASE_TABLET | Freq: Two times a day (BID) | ORAL | Status: DC
  Administered 2011-08-21 – 2011-08-23 (×5): 40 mg via ORAL
  Filled 2011-08-21 (×11): qty 1

## 2011-08-21 MED ORDER — GABAPENTIN 300 MG PO CAPS
300.0000 mg | ORAL_CAPSULE | Freq: Three times a day (TID) | ORAL | Status: DC
  Administered 2011-08-21 – 2011-08-23 (×7): 300 mg via ORAL
  Filled 2011-08-21 (×2): qty 1
  Filled 2011-08-21: qty 21
  Filled 2011-08-21 (×2): qty 1
  Filled 2011-08-21 (×2): qty 21
  Filled 2011-08-21 (×5): qty 1

## 2011-08-21 MED ORDER — ACETAMINOPHEN 325 MG PO TABS: 650.0000 mg | ORAL_TABLET | Freq: Four times a day (QID) | ORAL | Status: DC | PRN

## 2011-08-21 MED ORDER — HYDROXYZINE HCL 25 MG PO TABS: 25.0000 mg | ORAL_TABLET | Freq: Every evening | ORAL | Status: DC | PRN

## 2011-08-21 MED ORDER — CITALOPRAM HYDROBROMIDE 10 MG PO TABS
10.0000 mg | ORAL_TABLET | Freq: Every day | ORAL | Status: DC
  Administered 2011-08-21: 10 mg via ORAL
  Filled 2011-08-21 (×2): qty 1

## 2011-08-21 MED ORDER — SIMVASTATIN 20 MG PO TABS
20.0000 mg | ORAL_TABLET | Freq: Every day | ORAL | Status: DC
  Administered 2011-08-21 – 2011-08-22 (×2): 20 mg via ORAL
  Filled 2011-08-21 (×4): qty 1

## 2011-08-21 MED ORDER — MAGNESIUM HYDROXIDE 400 MG/5ML PO SUSP: 30.0000 mL | Freq: Every day | ORAL | Status: DC | PRN

## 2011-08-21 MED ORDER — CITALOPRAM HYDROBROMIDE 20 MG PO TABS
20.0000 mg | ORAL_TABLET | Freq: Every day | ORAL | Status: DC
  Administered 2011-08-22 – 2011-08-23 (×2): 20 mg via ORAL
  Filled 2011-08-21 (×2): qty 1
  Filled 2011-08-21: qty 7
  Filled 2011-08-21: qty 1

## 2011-08-21 NOTE — Progress Notes (Addendum)
Patient's self inventory sheet, sleeps well, good appetite, normal energy level, good attention span.  Rated depression #1, hopelessness #1.  Denied SI.  Zero pain goal, worst pain #1.  Plans to stay in the work, bible, love and respect himself as well as his family.  Unsure of discharge plans.  No problems taking meds after discharge. Denied SI and HI.   Denied A/V hallucinations.   Denied pain. Patient was given UA container for lab tests per MD order.  Patient has been cooperative, pleasant and alert.  Has been attending groups and went to dining room for meals. 1530  UA received and sent to lab. Patient signed voluntary form per MD order.

## 2011-08-21 NOTE — Progress Notes (Signed)
Grief and Loss Group  Facilitated grief and loss group on [address]. Discussed group rules and reviewed privacy/confidentiality and respect for fellow group members. Discussed types of loss and normal grief reactions. Group focused on how to embrace losses in life and move forward in life learning to not be controlled by grief. Group was ver open and supportive of one another AEB copious universality and sharing of how ppl have learned to move forward.  Pt was active and engaged in group. Pt reported how he had encountered multiple losses throughout his life d/t addiction. Pt reported that he had found comfort through faith in God. Pt was highly supportive of other group members, shared that he had to "reprogram his mind" in order to find stability and peace. Pt reported that moving forward and overcoming grief is a daily process that starts w/ small change and gave examples to other members how things had worked for him (re: recognizing bad thoughts or feelings in the moment and changing his focus).  Imad Shostak B MS, LPCA, NCC

## 2011-08-21 NOTE — H&P (Signed)
Medical/psychiatric screening examination/treatment/procedure(s) were performed by non-physician practitioner and as supervising physician I was immediately available for consultation/collaboration.   I have seen and examined this patient and agree with this evaluation.  
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
## 2011-08-21 NOTE — [facility] Suicide Risk Assessment (Signed)
Suicide Risk Assessment  Admission Assessment     Demographic factors:  Assessment Details Information Obtained From: Patient Current Mental Status:  Current Mental Status: Self-harm thoughts Loss Factors:  Loss Factors: Loss of significant relationship Historical Factors:    Risk Reduction Factors:  Risk Reduction Factors: Religious beliefs about death  CLINICAL FACTORS:   Severe Anxiety and/or Agitation Dysthymia Alcohol/Substance Abuse/Dependencies Medical Diagnoses and Treatments/Surgeries  COGNITIVE FEATURES THAT CONTRIBUTE TO RISK:  Thought constriction (tunnel vision)    SUICIDE RISK:   Moderate:  Frequent suicidal ideation with limited intensity, and duration, some specificity in terms of plans, no associated intent, good self-control, limited dysphoria/symptomatology, some risk factors present, and identifiable protective factors, including available and accessible social support.  Reason for hospitalization: .suicidal thougth  Diagnosis:  Axis I: Anxiety Disorder NOS, Substance Abuse and Substance Induced Mood Disorder  ADL's:  Intact  Sleep: Fair  Appetite:  Good  Suicidal Ideation:  Pt denies any thoughts of self harm today.  He regrets that he said what he said. Homicidal Ideation:  Denies adamantly any homicidal thoughts.  Mental Status Examination/Evaluation: Objective:  Appearance: Casual  Eye Contact::  Good  Speech:  Clear and Coherent  Volume:  Normal  Mood:  1 /10 on a scale of 1 is the best and 10 is the worst  Anxiety: .1/10 on the same scale  Affect:  Congruent  Thought Process:  Coherent  Orientation:  Full  Thought Content:  WDL  Suicidal Thoughts:  No  Homicidal Thoughts:  No  Memory:  Immediate;   Good  Judgement:  Fair  Insight:  Fair  Psychomotor Activity:  Normal  Concentration:  Fair  Recall:  Fair  Akathisia:  No  Handed:  Right  AIMS (if indicated):     Assets:  Communication Skills Desire for Improvement Financial  Resources/Insurance Housing Intimacy Leisure Time Physical Health Resilience Social Support  Sleep:  Number of Hours: 2.5     Vital Signs: Blood pressure 138/93, pulse 98, temperature 98 F (36.7 C), temperature source Oral, resp. rate 18, height 5\' 7"  (1.702 m), weight 136.533 kg (301 lb). Current Medications:  Current Facility-Administered Medications  Medication Dose Route Frequency Provider Last Rate Last Dose  . acetaminophen (TYLENOL) tablet 650 mg  650 mg Oral Q4H PRN Viviann Spare, NP      . alum & mag hydroxide-simeth (MAALOX/MYLANTA) 200-200-20 MG/5ML suspension 30 mL  30 mL Oral Q4H PRN Viviann Spare, NP   30 mL at 08/21/11 0236  . citalopram (CELEXA) tablet 20 mg  20 mg Oral Daily Sanjuana Kava, NP      . furosemide (LASIX) tablet 40 mg  40 mg Oral Daily Sanjuana Kava, NP   40 mg at 08/21/11 1324  . gabapentin (NEURONTIN) capsule 300 mg  300 mg Oral TID Mike Craze, MD      . hydrOXYzine (ATARAX/VISTARIL) tablet 25 mg  25 mg Oral QHS PRN Viviann Spare, NP      . magnesium hydroxide (MILK OF MAGNESIA) suspension 30 mL  30 mL Oral Daily PRN Viviann Spare, NP      . meloxicam (MOBIC) tablet 7.5 mg  7.5 mg Oral Daily Viviann Spare, NP   7.5 mg at 08/21/11 0843  . ondansetron (ZOFRAN) tablet 4 mg  4 mg Oral Q8H PRN Viviann Spare, NP      . pantoprazole (PROTONIX) EC tablet 40 mg  40 mg Oral BID AC Viviann Spare, NP  40 mg at 08/21/11 0844  . simvastatin (ZOCOR) tablet 20 mg  20 mg Oral q1800 Viviann Spare, NP      . Tamsulosin HCl (FLOMAX) capsule 0.4 mg  0.4 mg Oral Daily Viviann Spare, NP   0.4 mg at 08/21/11 0845  . DISCONTD: acetaminophen (TYLENOL) tablet 650 mg  650 mg Oral Q6H PRN Viviann Spare, NP      . DISCONTD: alum & mag hydroxide-simeth (MAALOX/MYLANTA) 200-200-20 MG/5ML suspension 30 mL  30 mL Oral PRN Viviann Spare, NP      . DISCONTD: citalopram (CELEXA) tablet 10 mg  10 mg Oral Daily Viviann Spare, NP   10 mg at 08/21/11 0843    . DISCONTD: LORazepam (ATIVAN) tablet 1 mg  1 mg Oral Q8H PRN Viviann Spare, NP       Facility-Administered Medications Ordered in Other Encounters  Medication Dose Route Frequency Provider Last Rate Last Dose  . DISCONTD: acetaminophen (TYLENOL) tablet 650 mg  650 mg Oral Q4H PRN Nelia Shi, MD      . DISCONTD: alum & mag hydroxide-simeth (MAALOX/MYLANTA) 200-200-20 MG/5ML suspension 30 mL  30 mL Oral PRN Nelia Shi, MD      . DISCONTD: citalopram (CELEXA) tablet 10 mg  10 mg Oral Daily Nelia Shi, MD   10 mg at 08/20/11 0949  . DISCONTD: LORazepam (ATIVAN) tablet 1 mg  1 mg Oral Q8H PRN Nelia Shi, MD      . DISCONTD: ondansetron [facility]) tablet 4 mg  4 mg Oral Q8H PRN Nelia Shi, MD      . DISCONTD: zolpidem (AMBIEN) tablet 5 mg  5 mg Oral QHS PRN Nelia Shi, MD        Lab Results:  Results for orders placed during the hospital encounter of 08/19/11 (from the past 48 hour(s))  CBC     Status: Abnormal   Collection Time   08/19/11  5:59 PM      Component Value Range Comment   WBC 10.5  4.0 - 10.5 (K/uL)    RBC 5.77  4.22 - 5.81 (MIL/uL)    Hemoglobin 15.9  13.0 - 17.0 (g/dL)    HCT 16.1  09.6 - 04.5 (%)    MCV 82.7  78.0 - 100.0 (fL)    MCH 27.6  26.0 - 34.0 (pg)    MCHC 33.3  30.0 - 36.0 (g/dL)    RDW 40.9  81.1 - 91.4 (%)    Platelets 446 (*) 150 - 400 (K/uL)   COMPREHENSIVE METABOLIC PANEL     Status: Abnormal   Collection Time   08/19/11  5:59 PM      Component Value Range Comment   Sodium 140  135 - 145 (mEq/L)    Potassium 4.4  3.5 - 5.1 (mEq/L)    Chloride 104  96 - 112 (mEq/L)    CO2 27  19 - 32 (mEq/L)    Glucose, Bld 96  70 - 99 (mg/dL)    BUN 13  6 - 23 (mg/dL)    Creatinine, Ser 7.82  0.50 - 1.35 (mg/dL)    Calcium 95.6  8.4 - 10.5 (mg/dL)    Total Protein 8.2  6.0 - 8.3 (g/dL)    Albumin 4.2  3.5 - 5.2 (g/dL)    AST 29  0 - 37 (U/L)    ALT 33  0 - 53 (U/L)    Alkaline Phosphatase 76  39 - 117 (  U/L)    Total Bilirubin 0.4  0.3  - 1.2 (mg/dL)    GFR calc non Af Amer 90 (*) >90 (mL/min)    GFR calc Af Amer >90  >90 (mL/min)   ETHANOL     Status: Normal   Collection Time   08/19/11  5:59 PM      Component Value Range Comment   Alcohol, Ethyl (B) <11  0 - 11 (mg/dL)   URINE RAPID DRUG SCREEN (HOSP PERFORMED)     Status: Abnormal   Collection Time   08/19/11  7:22 PM      Component Value Range Comment   Opiates NONE DETECTED  NONE DETECTED     Cocaine NONE DETECTED  NONE DETECTED     Benzodiazepines POSITIVE (*) NONE DETECTED     Amphetamines NONE DETECTED  NONE DETECTED     Tetrahydrocannabinol NONE DETECTED  NONE DETECTED     Barbiturates NONE DETECTED  NONE DETECTED     Physical Findings: AIMS:   CIWA:     COWS:      Treatment Plan Summary: Daily contact with patient to assess and evaluate symptoms and progress in treatment Medication management  Risk of harm to self is elevated by his past threats of suicide and his anxiety, but he sees that he has his family and himself to live for.  Risk of harm to others is minimal in that he has not been involved in fights or had any legal charges filed on him.  Plan: We will admit the patient for crisis stabilization and treatment. I talked to pt about starting Neurontin for anxiety and to prevent seizures from withdrawal from Xanax. I explained the risks and benefits of medication in detail.  We will continue on q. 15 checks the unit protocol. At this time there is no clinical indication for one-to-one observation as patient contract for safety and presents little risk to harm themself and others.  We will increase collateral information. I encourage patient to participate in group milieu therapy. Pt will be seen in treatment team meeting tomorrow morning for further treatment and appropriate discharge planning. Please see history and physical note for more detailed information ELOS: 3 to 5 days.   Kathren Scearce 08/21/2011, 2:45 PM

## 2011-08-21 NOTE — Tx Team (Signed)
Initial Interdisciplinary Treatment Plan  PATIENT STRENGTHS: (choose at least two) Active sense of humor Average or above average intelligence Capable of independent living Communication skills General fund of knowledge Religious Affiliation Supportive family/friends  PATIENT STRESSORS: Marital or family conflict Occupational concerns   PROBLEM LIST: Problem List/Patient Goals Date to be addressed Date deferred Reason deferred Estimated date of resolution  Worry about young adult son 08/21/11     Marital conflict 08/21/18     Impulsive handbling of gun 08/21/11     Occupational goal frustration 08/21/11                                    DISCHARGE CRITERIA:  Ability to meet basic life and health needs Improved stabilization in mood, thinking, and/or behavior Medical problems require only outpatient monitoring Motivation to continue treatment in a less acute level of care Need for constant or close observation no longer present Safe-care adequate arrangements made  PRELIMINARY DISCHARGE PLAN: Attend aftercare/continuing care group Outpatient therapy Return to previous living arrangement Return to previous work or school arrangements  PATIENT/FAMIILY INVOLVEMENT: This treatment plan has been presented to and reviewed with the patient, LENNIS KORB, and/or family member,.  The patient and family have been given the opportunity to ask questions and make suggestions.  Alicia Amel 08/21/2011, 4:58 AM

## 2011-08-21 NOTE — H&P (Signed)
Psychiatric Admission Assessment Adult  Patient Identification:  [name] [name] Date of Evaluation:  08/21/2011 Chief Complaint:  MOOD DISORDER NOS  History of Present Illness:: This is a 46 year old Caucasian male, admitted to [facility] from the [facility] ED with complaints of suicidal threats with a gun. Patient reports, "This is an involuntary admission for me. The reason was, I grabbed a gun momentarily in an anger, my wife saw it got scared, not quite sure what I was going to do with the gun. She called the sheriff. However, by the time the sheriff got there, my son has already wrestled me saying, "Daddy no" and took the gun away. I was not going to kill myself. I know better. The gun was not even loaded. What led to this was that, a friend of mine died this past November 28, 2022 from a motor cycle crash. It was  sad as well shocking to hear this news. I then took my wife's phone to call the family of my friend that died. I happen to see a text message on the phone for my wife from another close friend calling her honey and sweetie. This is a normal thing to hear from this my friend because he calls every body honey and sweetie. Only this time, he addressed my wife as such. It did not resonate well with me. She is my honey and my sweetie. No one else should address her that way. So I became quiet, my wife noticed, and asked me if everything was okay? I said to her "NO". Then I told her that I saw the text message from my friend to her. My wife took offense to the fact that I did not trust her. She said to me, if you are going to be behaving like this, I will leave you. There is nothing going on with me and your friend. He was texting me because I was helping them take care of things now that they had a death in their family. So I said to my wife, you are not going to leave, I will leave instead. So I grabbed my unloaded gun. That was what she saw and panicked. I am not depressed or suicidal. My life is good at  home and at work. I believe in God and I know what happens if one has to take his/her own life"  Mood Symptoms:  "I am not depressed or suicidal" Depression Symptoms:  "I don't feel sad, hopeless, helpless and or worthless" (Hypo) Manic Symptoms:  Irritable Mood, "at the time, may be. But it was temporary" Anxiety Symptoms:  Excessive Worry, that some one was texting and calling my wife                                                                                sweetie and honey" Psychotic Symptoms:  Paranoia, "may be a little bit"  PTSD Symptoms: Had a traumatic exposure:  Patient denies  Past Psychiatric History: Diagnosis: Suicidal threat with a gun  Hospitalizations: [facility] x 2  Outpatient Care: Dr. Nani Gasser  Substance Abuse Care: None reported  Self-Mutilation: None reported  Suicidal Attempts: Denies attempts  Violent Behaviors: Denies report  Past Medical History:   Past Medical History  Diagnosis Date  . Sleep apnea     cpap  . GERD (gastroesophageal reflux disease)     erosive reflux esophagitis, small HH  . Thyroid disease   . Back pain, chronic 02/18/11    BULGING DISK  . IBS (irritable bowel syndrome)   . Hemorrhoids   . S/P colonoscopy 08/2008    int/ext hemorrhoids. friable, rectal polyp  . Tubular adenoma of colon 2000  . Hyperlipemia   . S/P endoscopy 01/10/09    Dr Elmer Ramp, small HH  . Low serum cortisol level     Dr Horald Pollen  . UTI (lower urinary tract infection)   . Lower extremity edema   . Plantar fasciitis    Cardiac History:  HTN? Allergies:   Allergies  Allergen Reactions  . Hydromorphone Hcl Other (See Comments)    Feels like chest will bust open    PTA Medications: Prescriptions prior to admission  Medication Sig Dispense Refill  . ALPRAZolam (XANAX) 0.5 MG tablet Take 0.5 mg by mouth 4 (four) times daily as needed.        . hydrocortisone (CORTEF) 20 MG tablet Take 10 mg by mouth daily.       . meloxicam (MOBIC) 7.5 MG tablet Take 7.5  mg by mouth daily.        . methocarbamol (ROBAXIN) 750 MG tablet Take 750 mg by mouth 2 (two) times daily.       . Multiple Vitamins-Minerals (MENS MULTIVITAMIN PLUS) TABS Take 1 tablet by mouth daily.        Marland Kitchen omeprazole (PRILOSEC) 20 MG capsule Take 20 mg by mouth 2 (two) times daily.        . simvastatin (ZOCOR) 20 MG tablet Take 20 mg by mouth at bedtime.        . Tamsulosin HCl (FLOMAX) 0.4 MG CAPS Take 0.4 mg by mouth daily.          Previous Psychotropic Medications:  Medication/Dose  Furosemide 40 mg   Flomax 0.4 mg daily             Substance Abuse History in the last 12 months: Substance Age of 1st Use Last Use Amount Specific Type  Nicotine "I don't smoke, drink alcohol and or use illegal drug use"     Alcohol      Cannabis      Opiates      Cocaine      Methamphetamines      LSD      Ecstasy      Benzodiazepines      Caffeine      Inhalants      Others:                         Consequences of Substance Abuse: Medical Consequences:  Liver damage Legal Consequences:  Arrests, jail time Family Consequences:  Family discord Withdrawal Symptoms:   None  Social History: Current Place of Residence: [place] of Birth:  [place] Family Members: "My wife and son" Marital Status:  Married Children:1  Sons:1  Daughters:0 Relationships: "With my wife" Education:  No high school diploma" Educational Problems/Performance: "I completed 10th grade" Religious Beliefs/Practices: None reported History of Abuse (Emotional/Phsycial/Sexual): None reported Occupational Experiences: A truck Office manager History:  None. Legal History: None reported Hobbies/Interests: None reported  Family History:   Family History  Problem Relation Age of Onset  . Colon cancer  Father 66  . Diabetes Brother     Mental Status Examination/Evaluation: Objective:  Appearance: Casual and Obese, guarded  Eye Contact::  Good  Speech:  Clear and Coherent  Volume:   Normal  Mood:  "I feel disappointed at myself in the position that I find myself right now"  Affect:  Tearful  Thought Process:  Coherent  Orientation:  Full  Thought Content:  Rumination  Suicidal Thoughts:  No, patient denies.  Homicidal Thoughts:  No  Memory:  Immediate;   Good Recent;   Good Remote;   Good  Judgement:  Fair  Insight:  Fair  Psychomotor Activity:  Normal  Concentration:  Good  Recall:  Good  Akathisia:  No  Handed:  Right  AIMS (if indicated):     Assets:  Desire for Improvement  Sleep:  Number of Hours: 2.5     Laboratory/X-Ray: None Psychological Evaluation(s)      Assessment:    AXIS I:  Suicidal threat with a gun. AXIS II:  Deferred AXIS III:   Past Medical History  Diagnosis Date  . Sleep apnea     cpap  . GERD (gastroesophageal reflux disease)     erosive reflux esophagitis, small HH  . Thyroid disease   . Back pain, chronic 02/18/11    BULGING DISK  . IBS (irritable bowel syndrome)   . Hemorrhoids   . S/P colonoscopy 08/2008    int/ext hemorrhoids. friable, rectal polyp  . Tubular adenoma of colon 2000  . Hyperlipemia   . S/P endoscopy 01/10/09    Dr Elmer Ramp, small HH  . Low serum cortisol level     Dr Horald Pollen  . UTI (lower urinary tract infection)   . Lower extremity edema   . Plantar fasciitis    AXIS IV:  other psychosocial or environmental problems and problems related to social environment AXIS V:  51-60 moderate symptoms  Treatment Plan/Recommendations: Admit for safety and stabilization.                                                                Review and reinstate any pertinent home medications.                                                                Obtain HGBA1C and urinalysis    Treatment Plan Summary: Daily contact with patient to assess and evaluate symptoms and progress in treatment Medication management Current Medications:  Current Facility-Administered Medications  Medication Dose Route Frequency  Provider Last Rate Last Dose  . acetaminophen (TYLENOL) tablet 650 mg  650 mg Oral Q4H PRN Viviann Spare, NP      . alum & mag hydroxide-simeth (MAALOX/MYLANTA) 200-200-20 MG/5ML suspension 30 mL  30 mL Oral Q4H PRN Viviann Spare, NP   30 mL at 08/21/11 0236  . citalopram (CELEXA) tablet 10 mg  10 mg Oral Daily Viviann Spare, NP   10 mg at 08/21/11 0843  . hydrOXYzine (ATARAX/VISTARIL) tablet 25 mg  25 mg Oral QHS PRN Viviann Spare, NP      .  LORazepam (ATIVAN) tablet 1 mg  1 mg Oral Q8H PRN Viviann Spare, NP      . magnesium hydroxide (MILK OF MAGNESIA) suspension 30 mL  30 mL Oral Daily PRN Viviann Spare, NP      . meloxicam (MOBIC) tablet 7.5 mg  7.5 mg Oral Daily Viviann Spare, NP   7.5 mg at 08/21/11 0843  . ondansetron (ZOFRAN) tablet 4 mg  4 mg Oral Q8H PRN Viviann Spare, NP      . pantoprazole (PROTONIX) EC tablet 40 mg  40 mg Oral BID AC Viviann Spare, NP   40 mg at 08/21/11 0844  . simvastatin (ZOCOR) tablet 20 mg  20 mg Oral q1800 Viviann Spare, NP      . Tamsulosin HCl (FLOMAX) capsule 0.4 mg  0.4 mg Oral Daily Viviann Spare, NP   0.4 mg at 08/21/11 0845  . DISCONTD: acetaminophen (TYLENOL) tablet 650 mg  650 mg Oral Q6H PRN Viviann Spare, NP      . DISCONTD: alum & mag hydroxide-simeth (MAALOX/MYLANTA) 200-200-20 MG/5ML suspension 30 mL  30 mL Oral PRN Viviann Spare, NP       Facility-Administered Medications Ordered in Other Encounters  Medication Dose Route Frequency Provider Last Rate Last Dose  . DISCONTD: acetaminophen (TYLENOL) tablet 650 mg  650 mg Oral Q4H PRN Nelia Shi, MD      . DISCONTD: alum & mag hydroxide-simeth (MAALOX/MYLANTA) 200-200-20 MG/5ML suspension 30 mL  30 mL Oral PRN Nelia Shi, MD      . DISCONTD: citalopram (CELEXA) tablet 10 mg  10 mg Oral Daily Nelia Shi, MD   10 mg at 08/20/11 0949  . DISCONTD: LORazepam (ATIVAN) tablet 1 mg  1 mg Oral Q8H PRN Nelia Shi, MD      . DISCONTD: ondansetron  [facility]) tablet 4 mg  4 mg Oral Q8H PRN Nelia Shi, MD      . DISCONTD: zolpidem (AMBIEN) tablet 5 mg  5 mg Oral QHS PRN Nelia Shi, MD        Observation Level/Precautions:  Q 15 minutes checks for safety  Laboratory: Obtain  HbAIC UA  Psychotherapy:  Group  Medications:  See lists  Routine PRN Medications:  Yes  Consultations:  None indicated  Discharge Concerns:  Safety  Other:     Sanjuana Kava 3/19/201310:46 AM

## 2011-08-21 NOTE — Progress Notes (Signed)
[facility] Group Notes:  (Counselor/Nursing/MHT/Case Management/Adjunct)  08/21/2011 12:12 PM  Type of Therapy:  Psychoeducational Skills  Participation Level:  Active  Participation Quality:  Appropriate, Attentive, Sharing and Supportive  Affect:  Appropriate  Cognitive:  Alert, Appropriate and Oriented  Insight:  Good  Engagement in Group:  Good  Engagement in Therapy:  n/a  Modes of Intervention:  Activity, Education, Problem-solving, Socialization and Support  Summary of Progress/Problems: Sammuel was late to psycho-education group on labels. April missed activity that asked patients to look at how they label themselves and their peers, as well as how labels affect their decisions. Elazar was active while group discussed what labels are and how they are used, listed positive and negative labels people have labeled them and that they label themselves. Finneus was given a worksheet to complete listing negative labels and the reality of the label/changes they would like to make in their lives r/t the label.    Wandra Scot 08/21/2011, 12:12 PM

## 2011-08-21 NOTE — Tx Team (Signed)
Interdisciplinary Treatment Plan Update (Adult)  Date:  08/21/2011  Time Reviewed:  11:00 AM   Progress in Treatment: Attending groups:   Yes   Participating in groups:  Yes Taking medication as prescribed:  Yes Tolerating medication:  Yes Family/Significant othe contact made: Patient agreeable to contact with wife Patient understands diagnosis:  Yes Discussing patient identified problems/goals with staff: Yes Medical problems stabilized or resolved: Yes Denies suicidal/homicidal ideation:Yes Issues/concerns per patient self-inventory:  Other:  New problem(s) identified:  Reason for Continuation of Hospitalization: Depression Medication stabilization  Interventions implemented related to continuation of hospitalization:  Medication Management; safety checks q 15 mins  Additional comments:  Estimated length of stay:  Discharge Plan:  New goal(s):  Review of initial/current patient goals per problem list:    1.  Goal(s):  Eliminate SI/thoughts of self harm  Met:  Yes  Target date: d/c  As evidenced by:  Patient is currently  Denying SI  2.  Goal (s):  Reduce depression  Met:  No  Target date: d/c  As evidenced by:  Patient will rate depression at three or below on discharge  3.  Goal(s):  Refer for outpatient follow  up  Met:  No  Target date: d/c  As evidenced by: Outpatient follow up will be in place  Attendees: Patient:     Family:     Physician:  Orson Aloe, MD 08/21/2011 11:00 AM   Nursing:   Quintella Reichert, RN 08/21/2011 11:00 AM   CaseManager:  Juline Patch, LCSW 08/21/2011 11:00 AM   Counselor:   08/21/2011 11:00 AM   Other: Dr. Laury Deep 08/21/2011 11:00 AM   Other:  Reyes Ivan, LCSWA 08/21/2011  11:00 AM   Other:     Other:      Scribe for Treatment Team:   Wynn Banker, LCSW,  08/21/2011 11:00 AM

## 2011-08-22 DIAGNOSIS — F329 Major depressive disorder, single episode, unspecified: Secondary | ICD-10-CM | POA: Diagnosis present

## 2011-08-22 DIAGNOSIS — F39 Unspecified mood [affective] disorder: Secondary | ICD-10-CM

## 2011-08-22 DIAGNOSIS — F32A Depression, unspecified: Secondary | ICD-10-CM | POA: Diagnosis present

## 2011-08-22 NOTE — Discharge Planning (Signed)
Met with [name] [name] this afternoon.  He understands that we need to get confirmation from his wife about securing guns.  [name] is attempting to contact this afternoon.  He is planning on d/cing tomorrow.  Got him follow up appointments at [facility].  Also promised him a work Physicist, medical.

## 2011-08-22 NOTE — Progress Notes (Addendum)
[facility] Group Notes: (Counselor/Nursing/MHT/Case Management/Adjunct) 08/22/2011   @1 :15pm  Type of Therapy:  Group Therapy  Participation Level:  Active  Participation Quality:  Attentive, Sharing, Appropriate  Affect:  Appropriate  Cognitive:  Appropriate  Insight:  Good  Engagement in Group: Good  Engagement in Therapy:  Good  Modes of Intervention:  Support and Exploration  Summary of Progress/Problems:  [name] [name] was very engaged and insightful in discussion of what recovery may look like. He stated that he believes it has to do with building good character, and processed what that means to him. [name] [name] explored the cycle of self-worth, making good decisions, and meeting personal goals that he believes nurtures recovery. He used his personal experience with moving from bad decisions (stealing, drug abuse, etc) to good decisions (honesty, being there for his family and spirituality) to help another group member who was struggling with how to get past her habit of lying.   [name] [name] 08/21/11    4:02pm

## 2011-08-22 NOTE — Progress Notes (Signed)
D:  Pt attended group this evening.  Pt reported called police anonymously for wife in a domestic violence situation with the neighbors.  He stated wife went over to check on friend to make sure didn't need to go to hospital from injuries.  Pt expressed thankful to be in hospital at this time.  A:  Encouraged and supported positive choices made by pt.  R:  Pt is safe. Aundria Rud, Darwin Guastella L, MHT/NS

## 2011-08-22 NOTE — Progress Notes (Signed)
[facility] Group Notes:  (Counselor/Nursing/MHT/Case Management/Adjunct)  08/22/2011 12:04 PM  Type of Therapy:  Group Therapy  Participation Level:  Active  Participation Quality:  Appropriate  Affect:  Appropriate  Cognitive:  Appropriate  Insight:  Good  Engagement in Group:  Good  Engagement in Therapy:  Good  Modes of Intervention:  Support  Summary of Progress/Problems: Participated actively in group, offering insightful comments and support for other group members.  Mendleson, Argel Pablo 08/22/2011, 12:04 PM

## 2011-08-22 NOTE — Progress Notes (Signed)
[facility] Group Notes: (Counselor/Nursing/MHT/Case Management/Adjunct) 08/22/2011   @1 :15pm  Type of Therapy:  Group Therapy  Participation Level:  Active  Participation Quality:  Attentive, Sharing, Appropriate  Affect:  Appropriate   Cognitive:  Appropriate  Insight:  Good  Engagement in Group: Good  Engagement in Therapy:  Good  Modes of Intervention:  Support and Exploration  Summary of Progress/Problems:  [name] [name] processed his experience with fear, stating that he is often considered to be paranoid, though he sees himself as aware and prepared. He talked about knowing that he cannot control a situation, but that he can be in control of himself in every situation, so that he can react in a way that keeps him safe. [name] [name] described some characteristics of hypervigilance - making sure his back is to the wall at all times, knowing where each person in the room is located. He went on to explore ways to use fear as a motivator, such as being aware of red flags for situations both internally and externally, and understanding that fear can be overcome.   [name] [name] 08/22/2011 3:56 PM
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
## 2011-08-22 NOTE — Progress Notes (Signed)
Pt has been pleasant and cooperative this evening. Pt presents with no concerns. Pt has been observed interacting appropriately on the unit. Pt attends groups and interacts with others well. Pt is denying any SI and is reporting that he is ready for discharge. Continued support and availability as needed has been extended to this pt. Pt safety remains with q49min checks.

## 2011-08-22 NOTE — Progress Notes (Signed)
Patient ID: [name] [name], male   DOB: Jan 25, 1966, 46 y.o.   MRN: [id number] Pt is awake and active on the unit this AM. Pt denies SI/HI and AVH. Pt is attending groups and is cooperative with staff. Pt states that his friends think he is paranoid but he does not agree. He states that God allows him to see or anticipate things before they happen and that he feels that he should be ready for those things when they do. He described his premonitions as an "inner knowledge" that comes to him from God. Pt denies that he was a threat to himself or others when he was wielding his gun, but he believes his wife was trying to help him because she loves him. Writer will continue to monitor.

## 2011-08-22 NOTE — Progress Notes (Signed)
08/22/2011         Time: 1415      Group Topic/Focus: The focus of this group is on enhancing patients' problem solving skills, which involves identifying the problem, brainstorming solutions and choosing and trying a solution.   Participation Level: Active  Participation Quality: Appropriate and Attentive  Affect: Appropriate  Cognitive: Oriented   Additional Comments: Patient late to group after finishing a phone call, took a leadership role in the activity. York Spaniel he needs to work on slowing his mind down and taking things one step at a time.  Mckinzi Eriksen 08/22/2011 3:58 PM

## 2011-08-22 NOTE — Progress Notes (Signed)
[facility] MD Progress Note  08/22/2011 5:22 PM  Diagnosis:  Axis I: Dysthymic Disorder  ADL's:  Intact  Sleep: Good  Appetite:  Good  Suicidal Ideation:  Denies adamantly any suicidal thoughts. Homicidal Ideation:  Denies adamantly any homicidal thoughts.  Mental Status Examination/Evaluation: Objective:  Appearance: Casual  Eye Contact::  Good  Speech:  Clear and Coherent  Volume:  Normal  Mood:  1 /10 on a scale of 1 is the best and 10 is the worst  Anxiety: 1/10 on the same scale  Affect:  Congruent  Thought Process:  Coherent  Orientation:  Full  Thought Content:  WDL  Suicidal Thoughts:  No  Homicidal Thoughts:  No  Memory:  Immediate;   Good  Judgement:  Fair  Insight:  Fair  Psychomotor Activity:  Normal  Concentration:  Fair  Recall:  Fair  Akathisia:  No  Handed:  Right  AIMS (if indicated):     Assets:  Communication Skills Desire for Improvement Financial Resources/Insurance Housing Intimacy Leisure Time Physical Health Social Support Talents/Skills  Sleep:  Number of Hours: 5.25    Vital Signs:Blood pressure 117/82, pulse 129, temperature 98.1 F (36.7 C), temperature source Oral, resp. rate 17, height 5\' 7"  (1.702 m), weight 136.533 kg (301 lb), SpO2 95.00%. Current Medications: Current Facility-Administered Medications  Medication Dose Route Frequency Provider Last Rate Last Dose  . acetaminophen (TYLENOL) tablet 650 mg  650 mg Oral Q4H PRN Viviann Spare, NP      . alum & mag hydroxide-simeth (MAALOX/MYLANTA) 200-200-20 MG/5ML suspension 30 mL  30 mL Oral Q4H PRN Viviann Spare, NP   30 mL at 08/21/11 0236  . citalopram (CELEXA) tablet 20 mg  20 mg Oral Daily Sanjuana Kava, NP   20 mg at 08/22/11 1610  . furosemide (LASIX) tablet 40 mg  40 mg Oral Daily Sanjuana Kava, NP   40 mg at 08/22/11 9604  . gabapentin (NEURONTIN) capsule 300 mg  300 mg Oral TID Mike Craze, MD   300 mg at 08/22/11 1536  . hydrOXYzine (ATARAX/VISTARIL) tablet 25 mg  25  mg Oral QHS PRN Viviann Spare, NP      . magnesium hydroxide (MILK OF MAGNESIA) suspension 30 mL  30 mL Oral Daily PRN Viviann Spare, NP      . meloxicam (MOBIC) tablet 7.5 mg  7.5 mg Oral Daily Viviann Spare, NP   7.5 mg at 08/22/11 5409  . ondansetron (ZOFRAN) tablet 4 mg  4 mg Oral Q8H PRN Viviann Spare, NP      . pantoprazole (PROTONIX) EC tablet 40 mg  40 mg Oral BID AC Viviann Spare, NP   40 mg at 08/22/11 1717  . simvastatin (ZOCOR) tablet 20 mg  20 mg Oral q1800 Viviann Spare, NP   20 mg at 08/22/11 1717  . Tamsulosin HCl (FLOMAX) capsule 0.4 mg  0.4 mg Oral Daily Viviann Spare, NP   0.4 mg at 08/22/11 8119    Lab Results:  Results for orders placed during the hospital encounter of 08/21/11 (from the past 48 hour(s))  URINALYSIS, ROUTINE W REFLEX MICROSCOPIC     Status: Abnormal   Collection Time   08/21/11  3:12 PM      Component Value Range Comment   Color, Urine YELLOW  YELLOW     APPearance CLEAR  CLEAR     Specific Gravity, Urine 1.028  1.005 - 1.030     pH  5.5  5.0 - 8.0     Glucose, UA NEGATIVE  NEGATIVE (mg/dL)    Hgb urine dipstick TRACE (*) NEGATIVE     Bilirubin Urine NEGATIVE  NEGATIVE     Ketones, ur NEGATIVE  NEGATIVE (mg/dL)    Protein, ur NEGATIVE  NEGATIVE (mg/dL)    Urobilinogen, UA 0.2  0.0 - 1.0 (mg/dL)    Nitrite NEGATIVE  NEGATIVE     Leukocytes, UA NEGATIVE  NEGATIVE    URINE MICROSCOPIC-ADD ON     Status: Abnormal   Collection Time   08/21/11  3:12 PM      Component Value Range Comment   Squamous Epithelial / LPF RARE  RARE     WBC, UA 0-2  <3 (WBC/hpf)    RBC / HPF 0-2  <3 (RBC/hpf)    Bacteria, UA RARE  RARE     Crystals CA OXALATE CRYSTALS (*) NEGATIVE  AMORPHOUS URATES/PHOSPHATES  GLUCOSE, CAPILLARY     Status: Abnormal   Collection Time   08/21/11  3:37 PM      Component Value Range Comment   Glucose-Capillary 107 (*) 70 - 99 (mg/dL)   GLUCOSE, CAPILLARY     Status: Abnormal   Collection Time   08/21/11  4:57 PM       Component Value Range Comment   Glucose-Capillary 106 (*) 70 - 99 (mg/dL)   HEMOGLOBIN Z6X     Status: Abnormal   Collection Time   08/21/11  7:51 PM      Component Value Range Comment   Hemoglobin A1C 5.7 (*) <5.7 (%)    Mean Plasma Glucose 117 (*) <117 (mg/dL)   GLUCOSE, CAPILLARY     Status: Abnormal   Collection Time   08/22/11 11:50 AM      Component Value Range Comment   Glucose-Capillary 104 (*) 70 - 99 (mg/dL)     Physical Findings: AIMS:  , ,  ,  ,    CIWA:    COWS:     Treatment Plan Summary: Daily contact with patient to assess and evaluate symptoms and progress in treatment Medication management  Plan: 1. Verify collateral information and  Consider D/C tomorrow.  Vicki Pasqual 08/22/2011, 5:22 PM

## 2011-08-23 MED ORDER — MELOXICAM 7.5 MG PO TABS: 7.5000 mg | ORAL_TABLET | Freq: Every day | ORAL | Status: DC

## 2011-08-23 MED ORDER — OMEPRAZOLE 20 MG PO CPDR: 20.0000 mg | DELAYED_RELEASE_CAPSULE | Freq: Two times a day (BID) | ORAL | Status: DC

## 2011-08-23 MED ORDER — TAMSULOSIN HCL 0.4 MG PO CAPS: 0.4000 mg | ORAL_CAPSULE | Freq: Every day | ORAL | Status: DC

## 2011-08-23 MED ORDER — SIMVASTATIN 20 MG PO TABS: 20.0000 mg | ORAL_TABLET | Freq: Every day | ORAL | Status: DC

## 2011-08-23 MED ORDER — CITALOPRAM HYDROBROMIDE 20 MG PO TABS: 20.0000 mg | ORAL_TABLET | Freq: Every day | ORAL | Status: DC

## 2011-08-23 MED ORDER — MENS MULTIVITAMIN PLUS PO TABS: 1.0000 | ORAL_TABLET | Freq: Every day | ORAL | Status: DC

## 2011-08-23 MED ORDER — METHOCARBAMOL 750 MG PO TABS: 750.0000 mg | ORAL_TABLET | Freq: Two times a day (BID) | ORAL | Status: DC

## 2011-08-23 MED ORDER — GABAPENTIN 300 MG PO CAPS: 300.0000 mg | ORAL_CAPSULE | Freq: Three times a day (TID) | ORAL | Status: DC

## 2011-08-23 MED ORDER — HYDROCORTISONE 20 MG PO TABS: 10.0000 mg | ORAL_TABLET | Freq: Every day | ORAL | Status: DC

## 2011-08-23 MED ORDER — FUROSEMIDE 40 MG PO TABS: 40.0000 mg | ORAL_TABLET | Freq: Every day | ORAL | Status: AC

## 2011-08-23 NOTE — Progress Notes (Signed)
[facility] Group Notes: (Counselor/Nursing/MHT/Case Management/Adjunct) 08/23/2011   @  11:00am   Type of Therapy:  Group Therapy  Participation Level:  Active  Participation Quality:  Attentive, Sharing, Supportive   Affect:  Appropriate  Cognitive:  Appropriate  Insight:  Good  Engagement in Group: Limited (not present for first half)  Engagement in Therapy:  Limited  Modes of Intervention:  Support and Exploration  Summary of Progress/Problems:  [name] came to group near the end after meeting with the treatment team, but stated that he related very well with the quote being discussed (about caring for a person vs living for the person). He talked about how sometimes when one person recognizes they have been living for another, space is need to re-configure the relationship. [name] shared his own experience of being separated from his wife for 2 years before reconciling to encourage others to take time to care for self and meet one's own needs.  [name] 08/23/2011 1:02 PM

## 2011-08-23 NOTE — Discharge Instructions (Signed)
NA and AA are spiritual paths that could be helpful for some who are recovering from addictions.  [facility] is a 12 step program that is helpful for those that are addicted to helping others to the exclusion of themselves.  There are phone meetings that may be useful.

## 2011-08-23 NOTE — Progress Notes (Addendum)
Discharge Note:   Patient stated he is going home with his wife and son.   Denied SI and HI.   Denied A/V hallucinations.  Denied pain.  Suicide prevention information given to patient, discussed with patient, who stated he understood and has no questions.  Patient received all his belongings, clothing, miscellaneous items, prescription, medications.  Patient has been very cooperative, pleasant, alert.   Patient's wife got all his belongings which were in his locker earlier this week.  Patient stated he has appreciated all the staff has done to assist him while at [facility].

## 2011-08-23 NOTE — Tx Team (Signed)
Interdisciplinary Treatment Plan Update (Adult)  Date:  08/23/2011  Time Reviewed:  11:10 AM   Progress in Treatment: Attending groups: Yes Participating in groups:  Yes Taking medication as prescribed: Yes Tolerating medication:  Yes Family/Significant othe contact made:  Yes Patient understands diagnosis:  Yes Discussing patient identified problems/goals with staff:  Yes Medical problems stabilized or resolved:  Yes Denies suicidal/homicidal ideation: Yes Issues/concerns per patient self-inventory:  None identified Other: N/A  New problem(s) identified: None Identified  Reason for Continuation of Hospitalization: Stable to d/c  Interventions implemented related to continuation of hospitalization: Stable to d/c  Additional comments: N/A  Estimated length of stay: D/C today  Discharge Plan: Pt will follow up at [facility] for medication management and therapy  New goal(s): N/A  Review of initial/current patient goals per problem list:    1.  Goal(s): Eliminate SI  Met: Yes  Target date: by discharge  As evidenced by: Pt denies SI  2.  Goal (s): Reduce depressive symptoms  Met:  Yes  Target date: by discharge  As evidenced by: Reducing depression from a 10 to a 3 as reported by pt.  Pt ranks at a 1 today.   3.  Goal(s): Reduce anxiety symptoms  Met:  Yes  Target date: by discharge  As evidenced by: Reducing anxiety from a 10 to a 3 as reported by pt.  Pt ranks at a 1 today.    Attendees: Patient:  [name] [name] 08/23/2011 11:15 AM   Family:     Physician:  Orson Aloe, MD 08/23/2011 11:10 AM   Nursing: Omelia Blackwater, RN 08/23/2011 11:10 AM   Case Manager:  Reyes Ivan, LCSWA 08/23/2011  11:10 AM   Counselor:  Angus Palms, LCSW 08/23/2011  11:10 AM   Other:  Serena Colonel, NP 08/23/2011 11:10 AM   Other:  Quintella Reichert, RN 08/23/2011 11:14 AM   Other:     Other:      Scribe for Treatment Team:   Reyes Ivan 08/23/2011 11:10  AM

## 2011-08-23 NOTE — Progress Notes (Signed)
[facility] Adult Inpatient Family/Significant Other Suicide Prevention Education  Suicide Prevention Education:  Education Completed; wife, Barkley Kratochvil [phone]) has been identified by the patient as the family member/significant other with whom the patient will be residing, and identified as the person(s) who will aid the patient in the event of a mental health crisis (suicidal ideations/suicide attempt).  With written consent from the patient, the family member/significant other has been provided the following suicide prevention education, prior to the and/or following the discharge of the patient.  The suicide prevention education provided includes the following:  Suicide risk factors  Suicide prevention and interventions  National Suicide Hotline telephone number  [facility] assessment telephone number  [facility] Emergency Assistance 911  [facility] and/or Residential Mobile Crisis Unit telephone number  Request made of family/significant other to:  Remove weapons (e.g., guns, rifles, knives), all items previously/currently identified as safety concern.    Remove drugs/medications (over-the-counter, prescriptions, illicit drugs), all items previously/currently identified as a safety concern.  [name] [name] stated that she does not believe [name] [name] is a danger to himself or anyone else. She indicated that she is apprehensive about taking him home because she believes they need to separate and he does not seem to agree. She stated several times that he has never been violent with her and that she is not fearful of him. [name] [name] reported that she hopes the Xanax [name] [name] had taken was the way he responded the way he did recently. She went on to say that [name] [name] can be very jealous and controlling, and that in the past he had been labeled as having "narcissistic traits".[name] [name] indicated that [name] [name] is more manipulative than harmful. She reported that he sometimes plays "mind  games" such as pulling a gun that was not loaded, but not letting her know that it wasn't loaded.  [name] [name] again stated that there are no weapons in the home, and that [name] [name] has no access to any. She verbalized understanding of suicide prevention and had no further questions. Counselor encouraged [name] [name] to call crisis numbers at [facility] or at [facility] if she ever believes [name] [name] is at danger of hurting himself or of hurting her.    [name] [name] 08/23/2011, 4:26 PM

## 2011-08-23 NOTE — Progress Notes (Signed)
[facility] Adult Inpatient Family/Significant Other Suicide Prevention Education  Suicide Prevention Education:  Contact Attempts: [name] [name], wife, has been identified by the patient as the family member/significant other with whom the patient will be residing, and identified as the person(s) who will aid the patient in the event of a mental health crisis.  With written consent from the patient, two attempts were made to provide suicide prevention education, prior to and/or following the patient's discharge.  We were unsuccessful in providing suicide prevention education.  A suicide education pamphlet was given to the patient to share with family/significant other.  Date and time of first attempt: 08/22/2011 @ 14:38 * Counselor received a voicemail from [name] [name] stating that she has secured all the guns and weapons outside of the home so that [name] [name] has no access to them. Counselor will continue to attempt to contact [name] [name] for suicide prevention/collateral.    Date and time of second attempt:   [name] [name] 08/23/2011, 8:27 AM

## 2011-08-23 NOTE — [facility] Suicide Risk Assessment (Signed)
Suicide Risk Assessment  Discharge Assessment     Demographic factors:  Male    Current Mental Status Per Nursing Assessment::   On Admission:  Self-harm thoughts At Discharge:     Loss Factors: Loss of significant relationship  Historical Factors:    Risk Reduction Factors:      Continued Clinical Symptoms:  Severe Anxiety and/or Agitation Dysthymia Chronic Pain Medical Diagnoses and Treatments/Surgeries  Discharge Diagnoses:   AXIS I:  Major Depression, Recurrent severe AXIS II:  Deferred AXIS III:   Past Medical History  Diagnosis Date  . Sleep apnea     cpap  . GERD (gastroesophageal reflux disease)     erosive reflux esophagitis, small HH  . Thyroid disease   . Back pain, chronic 02/18/11    BULGING DISK  . IBS (irritable bowel syndrome)   . Hemorrhoids   . S/P colonoscopy 08/2008    int/ext hemorrhoids. friable, rectal polyp  . Tubular adenoma of colon 2000  . Hyperlipemia   . S/P endoscopy 01/10/09    Dr Elmer Ramp, small HH  . Low serum cortisol level     Dr Horald Pollen  . UTI (lower urinary tract infection)   . Lower extremity edema   . Plantar fasciitis    AXIS IV:  other psychosocial or environmental problems AXIS V:  41-50 serious symptoms  Cognitive Features That Contribute To Risk:  Closed-mindedness Thought constriction (tunnel vision)    Suicide Risk:  Minimal: No identifiable suicidal ideation.  Patients presenting with no risk factors but with morbid ruminations; may be classified as minimal risk based on the severity of the depressive symptoms  Current Mental Status Per Physician  ADL's:  Intact  Sleep: Good  Appetite:  Good  Suicidal Ideation:  Denies adamantly any suicidal thoughts. Homicidal Ideation:  Denies adamantly any homicidal thoughts.  Mental Status Examination/Evaluation: Objective:  Appearance: Casual  Eye Contact::  Good  Speech:  Clear and Coherent  Volume:  Normal  Mood:  Euthymic  Affect:  Congruent    Thought Process:  Coherent  Orientation:  Full  Thought Content:  WDL  Suicidal Thoughts:  No  Homicidal Thoughts:  No  Memory:  Immediate;   Good  Judgement:  Good  Insight:  Good  Psychomotor Activity:  Normal  Concentration:  Good  Recall:  Good  Akathisia:  No  AIMS (if indicated):     Assets:  Communication Skills Desire for Improvement Financial Resources/Insurance Housing Intimacy Physical Health Social Support Talents/Skills  Sleep: Number of Hours: 5.75    Vital Signs: Blood pressure 161/105, pulse 84, temperature 97 F (36.1 C), temperature source Oral, resp. rate 20, height 5\' 7"  (1.702 m), weight 136.533 kg (301 lb), SpO2 95.00%.  Labs Results for orders placed during the hospital encounter of 08/21/11 (from the past 72 hour(s))  URINALYSIS, ROUTINE W REFLEX MICROSCOPIC     Status: Abnormal   Collection Time   08/21/11  3:12 PM      Component Value Range Comment   Color, Urine YELLOW  YELLOW     APPearance CLEAR  CLEAR     Specific Gravity, Urine 1.028  1.005 - 1.030     pH 5.5  5.0 - 8.0     Glucose, UA NEGATIVE  NEGATIVE (mg/dL)    Hgb urine dipstick TRACE (*) NEGATIVE     Bilirubin Urine NEGATIVE  NEGATIVE     Ketones, ur NEGATIVE  NEGATIVE (mg/dL)    Protein, ur NEGATIVE  NEGATIVE (mg/dL)  Urobilinogen, UA 0.2  0.0 - 1.0 (mg/dL)    Nitrite NEGATIVE  NEGATIVE     Leukocytes, UA NEGATIVE  NEGATIVE    URINE MICROSCOPIC-ADD ON     Status: Abnormal   Collection Time   08/21/11  3:12 PM      Component Value Range Comment   Squamous Epithelial / LPF RARE  RARE     WBC, UA 0-2  <3 (WBC/hpf)    RBC / HPF 0-2  <3 (RBC/hpf)    Bacteria, UA RARE  RARE     Crystals CA OXALATE CRYSTALS (*) NEGATIVE  AMORPHOUS URATES/PHOSPHATES  GLUCOSE, CAPILLARY     Status: Abnormal   Collection Time   08/21/11  3:37 PM      Component Value Range Comment   Glucose-Capillary 107 (*) 70 - 99 (mg/dL)   GLUCOSE, CAPILLARY     Status: Abnormal   Collection Time   08/21/11   4:57 PM      Component Value Range Comment   Glucose-Capillary 106 (*) 70 - 99 (mg/dL)   HEMOGLOBIN Z6X     Status: Abnormal   Collection Time   08/21/11  7:51 PM      Component Value Range Comment   Hemoglobin A1C 5.7 (*) <5.7 (%)    Mean Plasma Glucose 117 (*) <117 (mg/dL)   GLUCOSE, CAPILLARY     Status: Abnormal   Collection Time   08/22/11 11:50 AM      Component Value Range Comment   Glucose-Capillary 104 (*) 70 - 99 (mg/dL)     RISK REDUCTION FACTORS: What pt has learned from hospital stay is "You are accountable for your actions and your actions may have an impact on the mental state of those around you."  Risk of self harm is elevated by his depression, but he sees that he has "Everything, my family, myself, my helping others and giving them a smile" to live for.  Risk of harm to others is minimal in that he has not been involved in fights or had any legal charges filed on him.  PLAN: Discharge home Continue Medication List  As of 08/23/2011 11:48 AM   STOP taking these medications         ALPRAZolam 0.5 MG tablet         TAKE these medications         citalopram 20 MG tablet   Commonly known as: CELEXA   Take 1 tablet (20 mg total) by mouth daily. For depression.      furosemide 40 MG tablet   Commonly known as: LASIX   Take 1 tablet (40 mg total) by mouth daily. For EDEMA.  Need to check for potassium getting low if you stay on this constantly.  Low Potassium can make you feel very bad.      gabapentin 300 MG capsule   Commonly known as: NEURONTIN   Take 1 capsule (300 mg total) by mouth 3 (three) times daily. For anxiety.  It is not addictive and does not disinhibit you.      hydrocortisone 20 MG tablet   Commonly known as: CORTEF   Take 0.5 tablets (10 mg total) by mouth daily. For cortisol replacement      meloxicam 7.5 MG tablet   Commonly known as: MOBIC   Take 1 tablet (7.5 mg total) by mouth daily. For antiinflamatation      Mens Multivitamin Plus  Tabs   Take 1 tablet by mouth daily. /For nutritional supplementation.  methocarbamol 750 MG tablet   Commonly known as: ROBAXIN   Take 1 tablet (750 mg total) by mouth 2 (two) times daily. For antiinflamation      omeprazole 20 MG capsule   Commonly known as: PRILOSEC   Take 1 capsule (20 mg total) by mouth 2 (two) times daily. For control of stomach acid secretion and helps GERD.      simvastatin 20 MG tablet   Commonly known as: ZOCOR   Take 1 tablet (20 mg total) by mouth at bedtime. To help lower cholesterol.      Tamsulosin HCl 0.4 MG Caps   Commonly known as: FLOMAX   Take 1 capsule (0.4 mg total) by mouth daily. For breathing           Plan Of Care/Follow-up recommendations:  Activities: Resume typical activities Diet: Resume typical diet Other: Follow up with outpatient provider and report any side effects to out patient prescriber.  Rukia Mcgillivray 08/23/2011, 11:47 AM

## 2011-08-23 NOTE — Progress Notes (Signed)
[facility] Case Management Discharge Plan:  Will you be returning to the same living situation after discharge: Yes,  home At discharge, do you have transportation home?:Yes,  wife Do you have the ability to pay for your medications:Yes,  income, insurance  Interagency Information:     Release of information consent forms completed and in the chart;  Patient's signature needed at discharge.  Patient to Follow up at:  Follow-up Information    Follow up with Florencia Reasons [facility]. (Your appointment is at 3:00.  If you have filled out the paperwork online, come at 3.  If not, come at 2:00 to fill out paperwork)    Contact information:   [address] [place], [place]  [address]  [phone]      Follow up with [facility] on 08/30/2011. (Dr Lolly Mustache will see you re: medication  11:45)          Patient denies SI/HI:   Yes,  yes    Safety Planning and Suicide Prevention discussed:  Yes,  yes  Barrier to discharge identified:No.  Summary and Recommendations:   Ida Rogue 08/23/2011, 8:30 AM

## 2011-08-23 NOTE — Progress Notes (Signed)
Sleeping.  Safety checks conducted Q 15 minutes.

## 2011-08-23 NOTE — Progress Notes (Signed)
Patient's self inventory sheet, sleeps well, good appetite, normal energy level, good attention span.  Rated depression and hopelessness #1.  Denied SI.  Denied pain, worst pain #1.  After discharge, plans to take right meds, exercise, love the family, keep reading bible.   Just want to thank the staff for their support and compassion for me and other, job well done!.  Not sure of discharge plans.  No problems taking meds after discharge.
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
## 2011-08-27 ENCOUNTER — Ambulatory Visit (INDEPENDENT_AMBULATORY_CARE_PROVIDER_SITE_OTHER): Payer: BC Managed Care – PPO | Admitting: Psychiatry

## 2011-08-27 ENCOUNTER — Encounter (HOSPITAL_COMMUNITY): Payer: Self-pay | Admitting: Psychiatry

## 2011-08-27 DIAGNOSIS — F321 Major depressive disorder, single episode, moderate: Secondary | ICD-10-CM

## 2011-08-28 NOTE — Progress Notes (Signed)
Patient Discharge Instructions:  Psychiatric Admission Assessment Note Provided,  08/28/2011 After Visit Summary (AVS) Provided,  08/28/2011 Face Sheet Provided, 08/28/2011 Faxed/Sent to the Next Level Care provider:  08/28/2011 Next Level Care Provider Has Access to the EMR, 08/28/2011 Provided Suicide Risk Assessment - Discharge Assessment 08/28/2011  Records provided to [facility] - Dr. Lolly Mustache, and Florencia Reasons via [facility] Access.  Wandra Scot, 08/28/2011, 9:38 AM
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
## 2011-08-29 NOTE — Patient Instructions (Signed)
Discussed orally 

## 2011-08-30 ENCOUNTER — Ambulatory Visit (INDEPENDENT_AMBULATORY_CARE_PROVIDER_SITE_OTHER): Payer: BC Managed Care – PPO | Admitting: Psychiatry

## 2011-08-30 ENCOUNTER — Encounter (HOSPITAL_COMMUNITY): Payer: Self-pay | Admitting: Psychiatry

## 2011-08-30 ENCOUNTER — Ambulatory Visit (HOSPITAL_COMMUNITY): Payer: Self-pay | Admitting: Psychiatry

## 2011-08-30 VITALS — Wt 312.0 lb

## 2011-08-30 DIAGNOSIS — F329 Major depressive disorder, single episode, unspecified: Secondary | ICD-10-CM

## 2011-08-30 MED ORDER — GABAPENTIN 300 MG PO CAPS: 300.0000 mg | ORAL_CAPSULE | Freq: Three times a day (TID) | ORAL | Status: DC

## 2011-08-30 MED ORDER — CITALOPRAM HYDROBROMIDE 10 MG PO TABS: ORAL_TABLET | ORAL | Status: DC
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
## 2011-08-31 NOTE — Discharge Summary (Signed)
I agree with this D/C Summary.  

## 2011-09-07 ENCOUNTER — Other Ambulatory Visit: Payer: Self-pay | Admitting: Endocrinology

## 2011-09-07 DIAGNOSIS — E049 Nontoxic goiter, unspecified: Secondary | ICD-10-CM

## 2011-09-13 ENCOUNTER — Ambulatory Visit (HOSPITAL_COMMUNITY): Payer: Self-pay | Admitting: Psychiatry

## 2011-09-13 ENCOUNTER — Ambulatory Visit
Admission: RE | Admit: 2011-09-13 | Discharge: 2011-09-13 | Disposition: A | Discharge status: Home / Self Care | Payer: BC Managed Care – PPO | Source: Ambulatory Visit | Attending: Endocrinology | Admitting: Endocrinology

## 2011-09-13 DIAGNOSIS — E049 Nontoxic goiter, unspecified: Secondary | ICD-10-CM

## 2011-09-29 ENCOUNTER — Other Ambulatory Visit (HOSPITAL_COMMUNITY): Payer: Self-pay | Admitting: Psychiatry

## 2011-09-30 ENCOUNTER — Other Ambulatory Visit (HOSPITAL_COMMUNITY): Payer: Self-pay | Admitting: Psychiatry

## 2011-10-03 ENCOUNTER — Telehealth: Payer: Self-pay | Admitting: Internal Medicine

## 2011-10-03 NOTE — Telephone Encounter (Signed)
Routed to refill box 

## 2011-10-03 NOTE — Telephone Encounter (Signed)
Patient is having trouble with [facility] in [place] getting his Omeprazole refilled and hes been w/o his medication going on 4wks now and he needs something please advise??

## 2011-10-04 ENCOUNTER — Encounter (HOSPITAL_COMMUNITY): Payer: Self-pay | Admitting: Psychiatry

## 2011-10-04 ENCOUNTER — Ambulatory Visit (HOSPITAL_COMMUNITY): Payer: Self-pay | Admitting: Psychiatry

## 2011-10-04 ENCOUNTER — Ambulatory Visit (INDEPENDENT_AMBULATORY_CARE_PROVIDER_SITE_OTHER): Payer: BC Managed Care – PPO | Admitting: Psychiatry

## 2011-10-04 VITALS — Wt 306.0 lb

## 2011-10-04 DIAGNOSIS — F329 Major depressive disorder, single episode, unspecified: Secondary | ICD-10-CM

## 2011-10-04 MED ORDER — GABAPENTIN 300 MG PO CAPS: 300.0000 mg | ORAL_CAPSULE | Freq: Three times a day (TID) | ORAL | Status: DC

## 2011-10-04 MED ORDER — CITALOPRAM HYDROBROMIDE 10 MG PO TABS: ORAL_TABLET | ORAL | Status: DC
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
## 2011-10-05 ENCOUNTER — Other Ambulatory Visit (HOSPITAL_COMMUNITY): Payer: Self-pay | Admitting: Endocrinology

## 2011-10-05 DIAGNOSIS — E0590 Thyrotoxicosis, unspecified without thyrotoxic crisis or storm: Secondary | ICD-10-CM

## 2011-10-15 ENCOUNTER — Encounter (HOSPITAL_COMMUNITY)
Admission: RE | Admit: 2011-10-15 | Discharge: 2011-10-15 | Disposition: A | Discharge status: Home / Self Care | Payer: BC Managed Care – PPO | Source: Ambulatory Visit | Attending: Endocrinology | Admitting: Endocrinology

## 2011-10-15 ENCOUNTER — Ambulatory Visit (HOSPITAL_COMMUNITY): Payer: Self-pay

## 2011-10-15 DIAGNOSIS — G478 Other sleep disorders: Secondary | ICD-10-CM | POA: Insufficient documentation

## 2011-10-15 DIAGNOSIS — R5381 Other malaise: Secondary | ICD-10-CM | POA: Insufficient documentation

## 2011-10-15 DIAGNOSIS — R634 Abnormal weight loss: Secondary | ICD-10-CM | POA: Insufficient documentation

## 2011-10-15 DIAGNOSIS — R5383 Other fatigue: Secondary | ICD-10-CM | POA: Insufficient documentation

## 2011-10-15 DIAGNOSIS — E0590 Thyrotoxicosis, unspecified without thyrotoxic crisis or storm: Secondary | ICD-10-CM

## 2011-10-15 DIAGNOSIS — L608 Other nail disorders: Secondary | ICD-10-CM | POA: Insufficient documentation

## 2011-10-15 DIAGNOSIS — R259 Unspecified abnormal involuntary movements: Secondary | ICD-10-CM | POA: Insufficient documentation

## 2011-10-15 MED ORDER — SODIUM IODIDE I 131 CAPSULE
10.0000 | Freq: Once | INTRAVENOUS | Status: AC | PRN
  Administered 2011-10-15: 9 via ORAL

## 2011-10-16 ENCOUNTER — Encounter (HOSPITAL_COMMUNITY)
Admission: RE | Admit: 2011-10-16 | Discharge: 2011-10-16 | Disposition: A | Discharge status: Home / Self Care | Payer: BC Managed Care – PPO | Source: Ambulatory Visit | Attending: Endocrinology | Admitting: Endocrinology

## 2011-10-16 ENCOUNTER — Encounter (HOSPITAL_COMMUNITY): Payer: Self-pay

## 2011-10-16 MED ORDER — SODIUM PERTECHNETATE TC 99M INJECTION
10.0000 | Freq: Once | INTRAVENOUS | Status: AC | PRN
  Administered 2011-10-16: 10 via INTRAVENOUS

## 2011-10-18 ENCOUNTER — Other Ambulatory Visit (HOSPITAL_COMMUNITY): Payer: Self-pay | Admitting: "Endocrinology

## 2011-10-18 DIAGNOSIS — C73 Malignant neoplasm of thyroid gland: Secondary | ICD-10-CM

## 2011-10-19 ENCOUNTER — Encounter (HOSPITAL_COMMUNITY): Payer: Self-pay

## 2011-10-19 ENCOUNTER — Encounter (HOSPITAL_COMMUNITY)
Admission: RE | Admit: 2011-10-19 | Discharge: 2011-10-19 | Disposition: A | Discharge status: Home / Self Care | Payer: BC Managed Care – PPO | Source: Ambulatory Visit | Attending: Endocrinology | Admitting: Endocrinology

## 2011-10-19 DIAGNOSIS — E0590 Thyrotoxicosis, unspecified without thyrotoxic crisis or storm: Secondary | ICD-10-CM

## 2011-10-19 DIAGNOSIS — E0500 Thyrotoxicosis with diffuse goiter without thyrotoxic crisis or storm: Secondary | ICD-10-CM | POA: Insufficient documentation

## 2011-10-19 MED ORDER — SODIUM IODIDE I 131 CAPSULE
25.0000 | Freq: Once | INTRAVENOUS | Status: AC | PRN
  Administered 2011-10-19: 25 via ORAL

## 2011-10-22 ENCOUNTER — Ambulatory Visit (HOSPITAL_COMMUNITY): Payer: Self-pay

## 2011-11-11 ENCOUNTER — Other Ambulatory Visit (HOSPITAL_COMMUNITY): Payer: Self-pay | Admitting: Psychiatry

## 2011-11-12 ENCOUNTER — Other Ambulatory Visit (HOSPITAL_COMMUNITY): Payer: Self-pay

## 2011-11-12 DIAGNOSIS — F329 Major depressive disorder, single episode, unspecified: Secondary | ICD-10-CM

## 2011-11-12 DIAGNOSIS — F322 Major depressive disorder, single episode, severe without psychotic features: Secondary | ICD-10-CM

## 2011-11-12 MED ORDER — CITALOPRAM HYDROBROMIDE 10 MG PO TABS: 10.0000 mg | ORAL_TABLET | Freq: Three times a day (TID) | ORAL | Status: DC

## 2011-11-12 MED ORDER — GABAPENTIN 300 MG PO CAPS: 300.0000 mg | ORAL_CAPSULE | Freq: Three times a day (TID) | ORAL | Status: DC

## 2011-11-19 ENCOUNTER — Encounter: Payer: Self-pay | Admitting: Internal Medicine

## 2011-11-29 ENCOUNTER — Ambulatory Visit (HOSPITAL_COMMUNITY): Payer: Self-pay | Admitting: Psychiatry

## 2011-12-04 ENCOUNTER — Ambulatory Visit (HOSPITAL_COMMUNITY): Payer: Self-pay | Admitting: Psychiatry

## 2012-02-19 ENCOUNTER — Encounter (HOSPITAL_BASED_OUTPATIENT_CLINIC_OR_DEPARTMENT_OTHER): Payer: Self-pay | Admitting: *Deleted

## 2012-02-19 NOTE — Progress Notes (Signed)
Pt had chest pain last week-had ekg c pcp-sees dr Tresa Endo today for stress test Will need istat To bring cpap-and all meds

## 2012-02-20 ENCOUNTER — Other Ambulatory Visit: Payer: Self-pay | Admitting: Physician Assistant

## 2012-02-20 ENCOUNTER — Encounter (HOSPITAL_BASED_OUTPATIENT_CLINIC_OR_DEPARTMENT_OTHER)
Admission: RE | Disposition: A | Discharge status: Home / Self Care | Payer: Self-pay | Source: Ambulatory Visit | Attending: Orthopedic Surgery

## 2012-02-20 ENCOUNTER — Encounter (HOSPITAL_BASED_OUTPATIENT_CLINIC_OR_DEPARTMENT_OTHER): Payer: Self-pay | Admitting: *Deleted

## 2012-02-20 ENCOUNTER — Ambulatory Visit (HOSPITAL_BASED_OUTPATIENT_CLINIC_OR_DEPARTMENT_OTHER): Payer: Worker's Compensation | Admitting: Anesthesiology

## 2012-02-20 ENCOUNTER — Encounter (HOSPITAL_BASED_OUTPATIENT_CLINIC_OR_DEPARTMENT_OTHER): Payer: Self-pay | Admitting: Anesthesiology

## 2012-02-20 ENCOUNTER — Ambulatory Visit (HOSPITAL_BASED_OUTPATIENT_CLINIC_OR_DEPARTMENT_OTHER)
Admission: RE | Admit: 2012-02-20 | Discharge: 2012-02-21 | Disposition: A | Discharge status: Home / Self Care | Payer: Worker's Compensation | Source: Ambulatory Visit | Attending: Orthopedic Surgery | Admitting: Orthopedic Surgery

## 2012-02-20 DIAGNOSIS — Y9269 Other specified industrial and construction area as the place of occurrence of the external cause: Secondary | ICD-10-CM | POA: Insufficient documentation

## 2012-02-20 DIAGNOSIS — F329 Major depressive disorder, single episode, unspecified: Secondary | ICD-10-CM

## 2012-02-20 DIAGNOSIS — F32A Depression, unspecified: Secondary | ICD-10-CM | POA: Diagnosis present

## 2012-02-20 DIAGNOSIS — Z9989 Dependence on other enabling machines and devices: Secondary | ICD-10-CM | POA: Diagnosis present

## 2012-02-20 DIAGNOSIS — Z9081 Acquired absence of spleen: Secondary | ICD-10-CM

## 2012-02-20 DIAGNOSIS — X58XXXA Exposure to other specified factors, initial encounter: Secondary | ICD-10-CM | POA: Insufficient documentation

## 2012-02-20 DIAGNOSIS — E669 Obesity, unspecified: Secondary | ICD-10-CM | POA: Diagnosis present

## 2012-02-20 DIAGNOSIS — S46219A Strain of muscle, fascia and tendon of other parts of biceps, unspecified arm, initial encounter: Secondary | ICD-10-CM | POA: Diagnosis present

## 2012-02-20 DIAGNOSIS — Y990 Civilian activity done for income or pay: Secondary | ICD-10-CM | POA: Insufficient documentation

## 2012-02-20 DIAGNOSIS — G4730 Sleep apnea, unspecified: Secondary | ICD-10-CM | POA: Insufficient documentation

## 2012-02-20 DIAGNOSIS — S46819A Strain of other muscles, fascia and tendons at shoulder and upper arm level, unspecified arm, initial encounter: Secondary | ICD-10-CM | POA: Insufficient documentation

## 2012-02-20 DIAGNOSIS — S43499A Other sprain of unspecified shoulder joint, initial encounter: Secondary | ICD-10-CM | POA: Insufficient documentation

## 2012-02-20 DIAGNOSIS — G4733 Obstructive sleep apnea (adult) (pediatric): Secondary | ICD-10-CM | POA: Diagnosis present

## 2012-02-20 DIAGNOSIS — I10 Essential (primary) hypertension: Secondary | ICD-10-CM | POA: Insufficient documentation

## 2012-02-20 DIAGNOSIS — K219 Gastro-esophageal reflux disease without esophagitis: Secondary | ICD-10-CM | POA: Diagnosis present

## 2012-02-20 DIAGNOSIS — Z862 Personal history of diseases of the blood and blood-forming organs and certain disorders involving the immune mechanism: Secondary | ICD-10-CM

## 2012-02-20 HISTORY — PX: DISTAL BICEPS TENDON REPAIR: SHX1461

## 2012-02-20 LAB — POCT I-STAT, CHEM 8
BUN: 15 mg/dL (ref 6–23)
Calcium, Ion: 1.21 mmol/L (ref 1.12–1.23)
Chloride: 108 mEq/L (ref 96–112)
Creatinine, Ser: 0.9 mg/dL (ref 0.50–1.35)
Glucose, Bld: 103 mg/dL — ABNORMAL HIGH (ref 70–99)
TCO2: 25 mmol/L (ref 0–100)

## 2012-02-20 SURGERY — REPAIR, TENDON, BICEPS, DISTAL
Anesthesia: General | Site: Elbow | Laterality: Left | Wound class: Clean

## 2012-02-20 MED ORDER — FUROSEMIDE 40 MG PO TABS: 40.0000 mg | ORAL_TABLET | Freq: Every day | ORAL | Status: DC

## 2012-02-20 MED ORDER — METOPROLOL TARTRATE 25 MG PO TABS: 25.0000 mg | ORAL_TABLET | Freq: Two times a day (BID) | ORAL | Status: DC

## 2012-02-20 MED ORDER — DEXTROSE 5 % IV SOLN
3.0000 g | INTRAVENOUS | Status: AC
  Administered 2012-02-20: 3 g via INTRAVENOUS

## 2012-02-20 MED ORDER — LACTATED RINGERS IV SOLN
INTRAVENOUS | Status: DC
  Administered 2012-02-20 (×2): via INTRAVENOUS

## 2012-02-20 MED ORDER — HYDROCODONE-ACETAMINOPHEN 5-325 MG PO TABS
2.0000 | ORAL_TABLET | ORAL | Status: DC | PRN
  Administered 2012-02-21: 2 via ORAL

## 2012-02-20 MED ORDER — METHOCARBAMOL 100 MG/ML IJ SOLN: 500.0000 mg | Freq: Four times a day (QID) | INTRAVENOUS | Status: DC | PRN

## 2012-02-20 MED ORDER — SIMVASTATIN 20 MG PO TABS: 20.0000 mg | ORAL_TABLET | Freq: Every day | ORAL | Status: DC

## 2012-02-20 MED ORDER — FENTANYL CITRATE 0.05 MG/ML IJ SOLN
INTRAMUSCULAR | Status: DC | PRN
  Administered 2012-02-20 (×2): 50 ug via INTRAVENOUS

## 2012-02-20 MED ORDER — GABAPENTIN 300 MG PO CAPS: 300.0000 mg | ORAL_CAPSULE | Freq: Three times a day (TID) | ORAL | Status: DC

## 2012-02-20 MED ORDER — HYDROCORTISONE SOD SUCCINATE 100 MG IJ SOLR
100.0000 mg | Freq: Every day | INTRAMUSCULAR | Status: AC
  Administered 2012-02-21: 100 mg via INTRAVENOUS

## 2012-02-20 MED ORDER — METHIMAZOLE 10 MG PO TABS: 10.0000 mg | ORAL_TABLET | Freq: Three times a day (TID) | ORAL | Status: DC

## 2012-02-20 MED ORDER — LIDOCAINE HCL (CARDIAC) 20 MG/ML IV SOLN
INTRAVENOUS | Status: DC | PRN
  Administered 2012-02-20: 80 mg via INTRAVENOUS

## 2012-02-20 MED ORDER — ONDANSETRON HCL 4 MG/2ML IJ SOLN: 4.0000 mg | Freq: Four times a day (QID) | INTRAMUSCULAR | Status: DC | PRN

## 2012-02-20 MED ORDER — CHLORHEXIDINE GLUCONATE 4 % EX LIQD: 60.0000 mL | Freq: Once | CUTANEOUS | Status: DC

## 2012-02-20 MED ORDER — METOCLOPRAMIDE HCL 5 MG/ML IJ SOLN: 5.0000 mg | Freq: Three times a day (TID) | INTRAMUSCULAR | Status: DC | PRN

## 2012-02-20 MED ORDER — ONDANSETRON HCL 4 MG PO TABS: 4.0000 mg | ORAL_TABLET | Freq: Four times a day (QID) | ORAL | Status: DC | PRN

## 2012-02-20 MED ORDER — POTASSIUM CHLORIDE IN NACL 20-0.9 MEQ/L-% IV SOLN
INTRAVENOUS | Status: DC
  Administered 2012-02-20: 75 mL/h via INTRAVENOUS

## 2012-02-20 MED ORDER — METHOCARBAMOL 750 MG PO TABS: 750.0000 mg | ORAL_TABLET | Freq: Two times a day (BID) | ORAL | Status: DC

## 2012-02-20 MED ORDER — CEFAZOLIN SODIUM-DEXTROSE 2-3 GM-% IV SOLR
2.0000 g | Freq: Four times a day (QID) | INTRAVENOUS | Status: AC
  Administered 2012-02-20 – 2012-02-21 (×3): 2 g via INTRAVENOUS

## 2012-02-20 MED ORDER — FENTANYL CITRATE 0.05 MG/ML IJ SOLN: 25.0000 ug | INTRAMUSCULAR | Status: DC | PRN

## 2012-02-20 MED ORDER — METHOCARBAMOL 500 MG PO TABS: 500.0000 mg | ORAL_TABLET | Freq: Four times a day (QID) | ORAL | Status: DC | PRN

## 2012-02-20 MED ORDER — HYDROCODONE-ACETAMINOPHEN 10-325 MG PO TABS: 1.0000 | ORAL_TABLET | ORAL | Status: DC | PRN

## 2012-02-20 MED ORDER — TAMSULOSIN HCL 0.4 MG PO CAPS: 0.4000 mg | ORAL_CAPSULE | Freq: Every day | ORAL | Status: DC

## 2012-02-20 MED ORDER — HYDROCORTISONE 10 MG PO TABS: 10.0000 mg | ORAL_TABLET | Freq: Every day | ORAL | Status: DC

## 2012-02-20 MED ORDER — MIDAZOLAM HCL 2 MG/2ML IJ SOLN
1.0000 mg | INTRAMUSCULAR | Status: DC | PRN
  Administered 2012-02-20: 2 mg via INTRAVENOUS

## 2012-02-20 MED ORDER — CITALOPRAM HYDROBROMIDE 10 MG PO TABS: 10.0000 mg | ORAL_TABLET | Freq: Three times a day (TID) | ORAL | Status: DC

## 2012-02-20 MED ORDER — PANTOPRAZOLE SODIUM 40 MG PO TBEC: 40.0000 mg | DELAYED_RELEASE_TABLET | Freq: Every day | ORAL | Status: DC

## 2012-02-20 MED ORDER — PROMETHAZINE HCL 25 MG/ML IJ SOLN: 6.2500 mg | INTRAMUSCULAR | Status: DC | PRN

## 2012-02-20 MED ORDER — METOCLOPRAMIDE HCL 5 MG PO TABS: 5.0000 mg | ORAL_TABLET | Freq: Three times a day (TID) | ORAL | Status: DC | PRN

## 2012-02-20 MED ORDER — MORPHINE SULFATE 2 MG/ML IJ SOLN
2.0000 mg | INTRAMUSCULAR | Status: DC | PRN
  Administered 2012-02-21: 2 mg via INTRAVENOUS

## 2012-02-20 MED ORDER — MENS MULTIVITAMIN PLUS PO TABS: 1.0000 | ORAL_TABLET | Freq: Every day | ORAL | Status: DC

## 2012-02-20 MED ORDER — FENTANYL CITRATE 0.05 MG/ML IJ SOLN
50.0000 ug | Freq: Once | INTRAMUSCULAR | Status: AC
  Administered 2012-02-20: 100 ug via INTRAVENOUS

## 2012-02-20 MED ORDER — HYDROCORTISONE SOD SUCCINATE 100 MG PF FOR IT USE
INTRAMUSCULAR | Status: DC | PRN
  Administered 2012-02-20: 100 mg via INTRATHECAL

## 2012-02-20 MED ORDER — LACTATED RINGERS IV SOLN: INTRAVENOUS | Status: DC

## 2012-02-20 MED ORDER — PROPOFOL 10 MG/ML IV BOLUS
INTRAVENOUS | Status: DC | PRN
  Administered 2012-02-20: 250 mg via INTRAVENOUS

## 2012-02-20 MED ORDER — DIPHENHYDRAMINE HCL 50 MG/ML IJ SOLN
12.5000 mg | Freq: Once | INTRAMUSCULAR | Status: AC
  Administered 2012-02-20: 12.5 mg via INTRAVENOUS

## 2012-02-20 MED ORDER — ONDANSETRON HCL 4 MG/2ML IJ SOLN
INTRAMUSCULAR | Status: DC | PRN
  Administered 2012-02-20: 4 mg via INTRAVENOUS

## 2012-02-20 SURGICAL SUPPLY — 80 items
ANCHOR BUTTON TIGHTROPE ABS (Orthopedic Implant) ×2 IMPLANT
BANDAGE ELASTIC 4 VELCRO ST LF (GAUZE/BANDAGES/DRESSINGS) ×4 IMPLANT
BANDAGE ELASTIC 6 VELCRO ST LF (GAUZE/BANDAGES/DRESSINGS) IMPLANT
BANDAGE GAUZE ELAST BULKY 4 IN (GAUZE/BANDAGES/DRESSINGS) IMPLANT
BENZOIN TINCTURE PRP APPL 2/3 (GAUZE/BANDAGES/DRESSINGS) IMPLANT
BLADE SURG 15 STRL LF DISP TIS (BLADE) ×1 IMPLANT
BLADE SURG 15 STRL SS (BLADE) ×1
BNDG COHESIVE 4X5 TAN STRL (GAUZE/BANDAGES/DRESSINGS) ×2 IMPLANT
BNDG ESMARK 4X9 LF (GAUZE/BANDAGES/DRESSINGS) ×2 IMPLANT
COVER TABLE BACK 60X90 (DRAPES) ×2 IMPLANT
CUFF TOURNIQUET SINGLE 18IN (TOURNIQUET CUFF) IMPLANT
CUFF TOURNIQUET SINGLE 24IN (TOURNIQUET CUFF) ×2 IMPLANT
DRAPE EXTREMITY T 121X128X90 (DRAPE) ×2 IMPLANT
DRAPE U 20/CS (DRAPES) ×2 IMPLANT
DRAPE U-SHAPE 47X51 STRL (DRAPES) ×2 IMPLANT
DRSG EMULSION OIL 3X3 NADH (GAUZE/BANDAGES/DRESSINGS) IMPLANT
DURAPREP 26ML APPLICATOR (WOUND CARE) ×2 IMPLANT
ELECT REM PT RETURN 9FT ADLT (ELECTROSURGICAL) ×2
ELECTRODE REM PT RTRN 9FT ADLT (ELECTROSURGICAL) ×1 IMPLANT
GAUZE XEROFORM 1X8 LF (GAUZE/BANDAGES/DRESSINGS) IMPLANT
GLOVE BIO SURGEON STRL SZ 6.5 (GLOVE) ×2 IMPLANT
GLOVE BIO SURGEON STRL SZ7 (GLOVE) ×2 IMPLANT
GLOVE BIOGEL PI IND STRL 7.0 (GLOVE) ×2 IMPLANT
GLOVE BIOGEL PI IND STRL 7.5 (GLOVE) ×1 IMPLANT
GLOVE BIOGEL PI INDICATOR 7.0 (GLOVE) ×2
GLOVE BIOGEL PI INDICATOR 7.5 (GLOVE) ×1
GLOVE SS BIOGEL STRL SZ 7.5 (GLOVE) ×1 IMPLANT
GLOVE SUPERSENSE BIOGEL SZ 7.5 (GLOVE) ×1
GOWN BRE IMP PREV XXLGXLNG (GOWN DISPOSABLE) ×6 IMPLANT
GOWN PREVENTION PLUS XLARGE (GOWN DISPOSABLE) ×2 IMPLANT
INSERTER BUTTON (SYSTAGENIX WOUND MANAGEMENT) ×2 IMPLANT
LOOP VESSEL MAXI BLUE (MISCELLANEOUS) IMPLANT
NDL SAFETY ECLIPSE 18X1.5 (NEEDLE) IMPLANT
NDL SUT 6 .5 CRC .975X.05 MAYO (NEEDLE) ×1 IMPLANT
NEEDLE ADDISON D1/2 CIR (NEEDLE) IMPLANT
NEEDLE FISTULA 1/2 CIRCLE (NEEDLE) ×2 IMPLANT
NEEDLE HYPO 18GX1.5 SHARP (NEEDLE)
NEEDLE HYPO 25X1 1.5 SAFETY (NEEDLE) IMPLANT
NEEDLE MAYO TAPER (NEEDLE) ×1
NEEDLE SPNL 18GX3.5 QUINCKE PK (NEEDLE) ×2 IMPLANT
NS IRRIG 1000ML POUR BTL (IV SOLUTION) ×2 IMPLANT
PACK BASIN DAY SURGERY FS (CUSTOM PROCEDURE TRAY) ×2 IMPLANT
PAD CAST 3X4 CTTN HI CHSV (CAST SUPPLIES) IMPLANT
PAD CAST 4YDX4 CTTN HI CHSV (CAST SUPPLIES) ×2 IMPLANT
PADDING CAST ABS 4INX4YD NS (CAST SUPPLIES)
PADDING CAST ABS COTTON 4X4 ST (CAST SUPPLIES) IMPLANT
PADDING CAST COTTON 3X4 STRL (CAST SUPPLIES)
PADDING CAST COTTON 4X4 STRL (CAST SUPPLIES) ×2
PASSER SUT SWANSON 36MM LOOP (INSTRUMENTS) IMPLANT
PENCIL BUTTON HOLSTER BLD 10FT (ELECTRODE) ×2 IMPLANT
PIN DRILL ACL TIGHTROPE 4MM (PIN) ×2 IMPLANT
SCREW BIO TENODESIS 8.0MM (Screw) ×2 IMPLANT
SLEEVE SCD COMPRESS KNEE MED (MISCELLANEOUS) ×2 IMPLANT
SPLINT FAST PLASTER 5X30 (CAST SUPPLIES) ×20
SPLINT PLASTER CAST FAST 5X30 (CAST SUPPLIES) ×20 IMPLANT
SPONGE GAUZE 4X4 12PLY (GAUZE/BANDAGES/DRESSINGS) ×2 IMPLANT
STAPLER VISISTAT (STAPLE) ×2 IMPLANT
STERILE EXTENSION TUBING ×2 IMPLANT
STOCKINETTE 4X48 STRL (DRAPES) IMPLANT
STOCKINETTE IMPERVIOUS LG (DRAPES) ×2 IMPLANT
STRIP CLOSURE SKIN 1/2X4 (GAUZE/BANDAGES/DRESSINGS) IMPLANT
SUCTION FRAZIER TIP 10 FR DISP (SUCTIONS) ×2 IMPLANT
SUT 2 FIBERLOOP 20 STRT BLUE (SUTURE) ×2
SUT ETHILON 4 0 PS 2 18 (SUTURE) IMPLANT
SUT FIBERWIRE 2-0 18 17.9 3/8 (SUTURE)
SUT MNCRL AB 3-0 PS2 18 (SUTURE) IMPLANT
SUT PROLENE 3 0 PS 2 (SUTURE) IMPLANT
SUT VIC AB 0 CT1 27 (SUTURE) ×1
SUT VIC AB 0 CT1 27XBRD ANBCTR (SUTURE) ×1 IMPLANT
SUT VIC AB 2-0 SH 27 (SUTURE) ×2
SUT VIC AB 2-0 SH 27XBRD (SUTURE) ×2 IMPLANT
SUTURE 2 FIBERLOOP 20 STRT BLU (SUTURE) ×1 IMPLANT
SUTURE FIBERWR 2-0 18 17.9 3/8 (SUTURE) IMPLANT
SYR BULB 3OZ (MISCELLANEOUS) ×4 IMPLANT
SYR CONTROL 10ML LL (SYRINGE) IMPLANT
TOWEL OR 17X24 6PK STRL BLUE (TOWEL DISPOSABLE) ×4 IMPLANT
TUBE CONNECTING 20X1/4 (TUBING) ×2 IMPLANT
UNDERPAD 30X30 INCONTINENT (UNDERPADS AND DIAPERS) ×2 IMPLANT
WATER STERILE IRR 1000ML POUR (IV SOLUTION) IMPLANT
YANKAUER SUCT BULB TIP NO VENT (SUCTIONS) ×2 IMPLANT

## 2012-02-20 NOTE — Progress Notes (Signed)
Assisted Dr. Kasik with left, ultrasound guided, interscalene  block. Side rails up, monitors on throughout procedure. See vital signs in flow sheet. Tolerated Procedure well.  

## 2012-02-20 NOTE — Interval H&P Note (Signed)
History and Physical Interval Note:  02/20/2012 1:04 PM  Lemont Fillers Para  has presented today for surgery, with the diagnosis of left elbow tendon rupture  The various methods of treatment have been discussed with the patient and family. After consideration of risks, benefits and other options for treatment, the patient has consented to  Procedure(s) (LRB) with comments: DISTAL BICEPS TENDON REPAIR (Left) - left elbow distal biceps tendon repair as [name] surgical intervention .  The patient's history has been reviewed, patient examined, no change in status, stable for surgery.  I have reviewed the patient's chart and labs.  Questions were answered to the patient's satisfaction.     [name] [name]

## 2012-02-20 NOTE — H&P (Signed)
[name] [name] is an 46 y.o. male.   Chief Complaint: left elbow distal biceps rupture HPI: 10 yearl old male seen for evaluation of a work related injury that occurred on 01/21/2012.  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  He felt a pop in the elbow and he has had significant pain and weakness since then.  He hand an MRI of the elbow on 02/11/2012 that revealed a complete tear of the distal biceps tendon with 7 cm of retraction.  He has significant pain with this.  His medical history is complicated by thyroid disease and adrenal insufficiency.  Dr Talmage Nap from endocrinology follows these.  He has had some chaest pain this week and was referred Dr Daphene Jaeger.  He had a normal stress test on 02-19-2012.  It is medically necessary that his distal biceps be surgically repaired.  The risks, benefits, and possible complications of the procedure were discussed in detail with the patient.  The patient is without question.  Past Medical History  Diagnosis Date  . Sleep apnea     cpap  . GERD (gastroesophageal reflux disease)     erosive reflux esophagitis, small HH  . Thyroid disease   . Back pain, chronic 02/18/11    BULGING DISK  . IBS (irritable bowel syndrome)   . Hemorrhoids   . S/P colonoscopy 08/2008    int/ext hemorrhoids. friable, rectal polyp  . Tubular adenoma of colon 2000  . Hyperlipemia   . S/P endoscopy 01/10/09    Dr Elmer Ramp, small HH  . Low serum cortisol level     Dr Horald Pollen  . UTI (lower urinary tract infection)   . Lower extremity edema   . Plantar fasciitis     Past Surgical History  Procedure Date  . Splenectomy 2003    itp  . Cholecystectomy 2006  . Knee surgery     left    Family History  Problem Relation Age of Onset  . Colon cancer Father 80  . Diabetes Brother    Social History:  reports that he quit smoking about 4 years ago. His smoking use included Cigarettes. He has a 30 pack-year smoking history. He does not have any smokeless  tobacco history on file. He reports that he does not drink alcohol or use illicit drugs.  Allergies:  Allergies  Allergen Reactions  . Hydromorphone Hcl Other (See Comments)    Feels like chest will bust open    Current Outpatient Prescriptions on File Prior to Visit  Medication Sig Dispense Refill  . citalopram (CELEXA) 10 MG tablet TAKE 3 TABLETS EVERY DAY  90 tablet  0  . citalopram (CELEXA) 10 MG tablet Take 1 tablet (10 mg total) by mouth 3 (three) times daily. TAKE 3 TABLETS EVERY DAY  270 tablet  1  . furosemide (LASIX) 40 MG tablet Take 1 tablet (40 mg total) by mouth daily. For EDEMA.  Need to check for potassium getting low if you stay on this constantly.  Low Potassium can make you feel very bad.  30 tablet  0  . gabapentin (NEURONTIN) 300 MG capsule Take 1 capsule (300 mg total) by mouth 3 (three) times daily.  270 capsule  0  . hydrocortisone (CORTEF) 20 MG tablet Take 0.5 tablets (10 mg total) by mouth daily. For cortisol replacement  15 tablet  0  . meloxicam (MOBIC) 7.5 MG tablet Take 1 tablet (7.5 mg total) by mouth daily. For antiinflamatation  30 tablet  0  . methocarbamol (ROBAXIN) 750 MG tablet Take 1 tablet (750 mg total) by mouth 2 (two) times daily. For antiinflamation  60 tablet  0  . Multiple Vitamins-Minerals (MENS MULTIVITAMIN PLUS) TABS Take 1 tablet by mouth daily. /For nutritional supplementation.  30 tablet  0  . omeprazole (PRILOSEC) 20 MG capsule Take 1 capsule (20 mg total) by mouth 2 (two) times daily. For control of stomach acid secretion and helps GERD.  60 capsule  0  . simvastatin (ZOCOR) 20 MG tablet Take 1 tablet (20 mg total) by mouth at bedtime. To help lower cholesterol.  30 tablet  0  . Tamsulosin HCl (FLOMAX) 0.4 MG CAPS Take 1 capsule (0.4 mg total) by mouth daily. For breathing  30 capsule  0    (Not in a hospital admission)  No results found for this or any previous visit (from the past 48 hour(s)). No results found.  Review of Systems    Constitutional: Negative.   HENT: Negative.   Eyes: Negative.   Respiratory: Negative.   Cardiovascular: Negative.   Gastrointestinal: Negative.   Genitourinary: Negative.   Musculoskeletal:       Left elbow pain  Skin: Negative.   Neurological: Negative.   Endo/Heme/Allergies: Negative.   Psychiatric/Behavioral: Negative.     There were no vitals taken for this visit. Physical Exam  Constitutional: He is oriented to person, place, and time. He appears well-developed and well-nourished.  HENT:  Head: Normocephalic and atraumatic.  Mouth/Throat: Oropharynx is clear and moist.  Eyes: Conjunctivae normal are normal.  Neck: Neck supple.  Cardiovascular: Normal rate and regular rhythm.   Respiratory: Effort normal.  GI: Soft.  Genitourinary:       Not pertinent to current symptomatology therefore not examined.  Musculoskeletal:       Left elbow has a palpable defect in the distal biceps with weakness on supination.  He has full range of motion of the elbow.  It is stable to ligamentous exam.  Right elbow has full range of motion without pain or weakness. 2+radial pulses.  Neurological: He is alert and oriented to person, place, and time.  Skin: Skin is warm and dry.  Psychiatric: He has a normal mood and affect. His behavior is normal. Thought content normal.     Assessment Left elbow distal biceps tendon Rupture History of idiopathic thrombocytopenia Obesity Obstructive sleep apnea Status post splenectomy History of pyelonephritis Adrenal insufficiency Thyroid disease Depression GERD   Plan Left elbow distal biceps tendon repair.  The risks, benefits, and possible complications of the procedure were discussed in detail with the patient.  The patient is without question.  Dr Talmage Nap has requested 100mg  IV stress dose of hydrocortisone preop.  Malavika Lira J 02/20/2012, 8:51 AM

## 2012-02-20 NOTE — Transfer of Care (Signed)
Immediate Anesthesia Transfer of Care Note  Patient: [name] [name]  Procedure(s) Performed: Procedure(s) (LRB) with comments: DISTAL BICEPS TENDON REPAIR (Left) - left elbow distal biceps tendon repair  Patient Location: PACU  Anesthesia Type: GA combined with regional for post-op pain  Level of Consciousness: sedated and patient cooperative  Airway & Oxygen Therapy: Patient Spontanous Breathing and Patient connected to face mask oxygen  Post-op Assessment: Report given to PACU RN and Post -op Vital signs reviewed and stable  Post vital signs: Reviewed and stable  Complications: No apparent anesthesia complications

## 2012-02-20 NOTE — H&P (View-Only) (Signed)
[name] [name] is an 46 y.o. male.   Chief Complaint: left elbow distal biceps rupture HPI: 46 yearl old male seen for evaluation of a work related injury that occurred on 01/21/2012.  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  He felt a pop in the elbow and he has had significant pain and weakness since then.  He hand an MRI of the elbow on 02/11/2012 that revealed a complete tear of the distal biceps tendon with 7 cm of retraction.  He has significant pain with this.  His medical history is complicated by thyroid disease and adrenal insufficiency.  Dr Balan from endocrinology follows these.  He has had some chaest pain this week and was referred Dr Tom Kelly.  He had a normal stress test on 02-19-2012.  It is medically necessary that his distal biceps be surgically repaired.  The risks, benefits, and possible complications of the procedure were discussed in detail with the patient.  The patient is without question.  Past Medical History  Diagnosis Date  . Sleep apnea     cpap  . GERD (gastroesophageal reflux disease)     erosive reflux esophagitis, small HH  . Thyroid disease   . Back pain, chronic 02/18/11    BULGING DISK  . IBS (irritable bowel syndrome)   . Hemorrhoids   . S/P colonoscopy 08/2008    int/ext hemorrhoids. friable, rectal polyp  . Tubular adenoma of colon 2000  . Hyperlipemia   . S/P endoscopy 01/10/09    Dr Rourk-normal, small HH  . Low serum cortisol level     Dr Balen  . UTI (lower urinary tract infection)   . Lower extremity edema   . Plantar fasciitis     Past Surgical History  Procedure Date  . Splenectomy 2003    itp  . Cholecystectomy 2006  . Knee surgery     left    Family History  Problem Relation Age of Onset  . Colon cancer Father 52  . Diabetes Brother    Social History:  reports that he quit smoking about 4 years ago. His smoking use included Cigarettes. He has a 30 pack-year smoking history. He does not have any smokeless  tobacco history on file. He reports that he does not drink alcohol or use illicit drugs.  Allergies:  Allergies  Allergen Reactions  . Hydromorphone Hcl Other (See Comments)    Feels like chest will bust open    Current Outpatient Prescriptions on File Prior to Visit  Medication Sig Dispense Refill  . citalopram (CELEXA) 10 MG tablet TAKE 3 TABLETS EVERY DAY  90 tablet  0  . citalopram (CELEXA) 10 MG tablet Take 1 tablet (10 mg total) by mouth 3 (three) times daily. TAKE 3 TABLETS EVERY DAY  270 tablet  1  . furosemide (LASIX) 40 MG tablet Take 1 tablet (40 mg total) by mouth daily. For EDEMA.  Need to check for potassium getting low if you stay on this constantly.  Low Potassium can make you feel very bad.  30 tablet  0  . gabapentin (NEURONTIN) 300 MG capsule Take 1 capsule (300 mg total) by mouth 3 (three) times daily.  270 capsule  0  . hydrocortisone (CORTEF) 20 MG tablet Take 0.5 tablets (10 mg total) by mouth daily. For cortisol replacement  15 tablet  0  . meloxicam (MOBIC) 7.5 MG tablet Take 1 tablet (7.5 mg total) by mouth daily. For antiinflamatation    30 tablet  0  . methocarbamol (ROBAXIN) 750 MG tablet Take 1 tablet (750 mg total) by mouth 2 (two) times daily. For antiinflamation  60 tablet  0  . Multiple Vitamins-Minerals (MENS MULTIVITAMIN PLUS) TABS Take 1 tablet by mouth daily. /For nutritional supplementation.  30 tablet  0  . omeprazole (PRILOSEC) 20 MG capsule Take 1 capsule (20 mg total) by mouth 2 (two) times daily. For control of stomach acid secretion and helps GERD.  60 capsule  0  . simvastatin (ZOCOR) 20 MG tablet Take 1 tablet (20 mg total) by mouth at bedtime. To help lower cholesterol.  30 tablet  0  . Tamsulosin HCl (FLOMAX) 0.4 MG CAPS Take 1 capsule (0.4 mg total) by mouth daily. For breathing  30 capsule  0    (Not in a hospital admission)  No results found for this or any previous visit (from the past 48 hour(s)). No results found.  Review of Systems    Constitutional: Negative.   HENT: Negative.   Eyes: Negative.   Respiratory: Negative.   Cardiovascular: Negative.   Gastrointestinal: Negative.   Genitourinary: Negative.   Musculoskeletal:       Left elbow pain  Skin: Negative.   Neurological: Negative.   Endo/Heme/Allergies: Negative.   Psychiatric/Behavioral: Negative.     There were no vitals taken for this visit. Physical Exam  Constitutional: He is oriented to person, place, and time. He appears well-developed and well-nourished.  HENT:  Head: Normocephalic and atraumatic.  Mouth/Throat: Oropharynx is clear and moist.  Eyes: Conjunctivae normal are normal.  Neck: Neck supple.  Cardiovascular: Normal rate and regular rhythm.   Respiratory: Effort normal.  GI: Soft.  Genitourinary:       Not pertinent to current symptomatology therefore not examined.  Musculoskeletal:       Left elbow has a palpable defect in the distal biceps with weakness on supination.  He has full range of motion of the elbow.  It is stable to ligamentous exam.  Right elbow has full range of motion without pain or weakness. 2+radial pulses.  Neurological: He is alert and oriented to person, place, and time.  Skin: Skin is warm and dry.  Psychiatric: He has a normal mood and affect. His behavior is normal. Thought content normal.     Assessment Left elbow distal biceps tendon Rupture History of idiopathic thrombocytopenia Obesity Obstructive sleep apnea Status post splenectomy History of pyelonephritis Adrenal insufficiency Thyroid disease Depression GERD   Plan Left elbow distal biceps tendon repair.  The risks, benefits, and possible complications of the procedure were discussed in detail with the patient.  The patient is without question.  Dr Balan has requested 100mg IV stress dose of hydrocortisone preop.  Vyron Fronczak J 02/20/2012, 8:51 AM    

## 2012-02-20 NOTE — Anesthesia Procedure Notes (Addendum)
Anesthesia Regional Block:  Supraclavicular block  Pre-Anesthetic Checklist: ,, timeout performed, Correct Patient, Correct Site, Correct Laterality, Correct Procedure, Correct Position, site marked, Risks and benefits discussed,  Surgical consent,  Pre-op evaluation,  At surgeon's request and post-op pain management  Laterality: Left  Prep: chloraprep       Needles:  Injection technique: Single-shot  Needle Type: Echogenic Stimulator Needle     Needle Length: 5cm 5 cm Needle Gauge: 22 and 22 G    Additional Needles:  Procedures: ultrasound guided and nerve stimulator Supraclavicular block  Nerve Stimulator or Paresthesia:  Response: 0.5 mA,   Additional Responses:   Narrative:  Start time: 02/20/2012 11:45 AM End time: 02/20/2012 11:57 AM Injection made incrementally with aspirations every 5 mL. Anesthesiologist: Dr Gypsy Balsam  Additional Notes: [id number] L Supraclav Block POP CHG prep, sterile tech #22 stim/echo needle with good echo visualization of anatomy and stim down to .42ma-pix in chart Multiple neg asp Vernia Buff .5% w/epi 25cc+decadron 4mg  infiltrated No compl Dr Gypsy Balsam   Procedure Name: LMA Insertion Date/Time: 02/20/2012 1:17 PM Performed by: Gar Gibbon Pre-anesthesia Checklist: Patient identified, Emergency Drugs available, Suction available and Patient being monitored Patient Re-evaluated:Patient Re-evaluated prior to inductionOxygen Delivery Method: Circle System Utilized Preoxygenation: Pre-oxygenation with 100% oxygen Intubation Type: IV induction Ventilation: Mask ventilation without difficulty LMA: LMA with gastric port inserted LMA Size: 5.0 Number of attempts: 1 Placement Confirmation: positive ETCO2 Tube secured with: Tape Dental Injury: Teeth and Oropharynx as per pre-operative assessment

## 2012-02-20 NOTE — Anesthesia Preprocedure Evaluation (Addendum)
Anesthesia Evaluation  Patient identified by MRN, date of birth, ID band Patient awake    Reviewed: Allergy & Precautions, H&P , NPO status , Patient's Chart, lab work & pertinent test results  History of Anesthesia Complications (+) AWARENESS UNDER ANESTHESIA  Airway Mallampati: II TM Distance: >3 FB Neck ROM: Full    Dental   Pulmonary sleep apnea and Continuous Positive Airway Pressure Ventilation ,  breath sounds clear to auscultation        Cardiovascular hypertension, Rhythm:Regular Rate:Normal     Neuro/Psych Depression  Neuromuscular disease    GI/Hepatic GERD-  ,  Endo/Other  Morbid obesity  Renal/GU      Musculoskeletal   Abdominal (+) + obese,   Peds  Hematology   Anesthesia Other Findings   Reproductive/Obstetrics                          Anesthesia Physical Anesthesia Plan  ASA: III  Anesthesia Plan: General   Post-op Pain Management:    Induction: Intravenous  Airway Management Planned: LMA  Additional Equipment:   Intra-op Plan:   Post-operative Plan: Extubation in OR  Informed Consent: I have reviewed the patients History and Physical, chart, labs and discussed the procedure including the risks, benefits and alternatives for the proposed anesthesia with the patient or authorized representative who has indicated his/her understanding and acceptance.     Plan Discussed with: CRNA and Surgeon  Anesthesia Plan Comments: (Adrenal insuf-will supplement with steroid)       Anesthesia Quick Evaluation

## 2012-02-20 NOTE — Anesthesia Postprocedure Evaluation (Signed)
Anesthesia Post Note  Patient: [name] [name]  Procedure(s) Performed: Procedure(s) (LRB): DISTAL BICEPS TENDON REPAIR (Left)  Anesthesia type: General  Patient location: PACU  Post pain: Pain level controlled and Adequate analgesia  Post assessment: Post-op Vital signs reviewed, Patient's Cardiovascular Status Stable, Respiratory Function Stable, Patent Airway and Pain level controlled  Last Vitals:  Filed Vitals:   02/20/12 1530  BP: 132/70  Pulse: 86  Temp:   Resp: 18    Post vital signs: Reviewed and stable  Level of consciousness: awake, alert  and oriented  Complications: No apparent anesthesia complications

## 2012-02-20 NOTE — Brief Op Note (Signed)
02/20/2012  2:20 PM  PATIENT:  [name] [name]  46 y.o. male  PRE-OPERATIVE DIAGNOSIS:  left elbow biceps tendon rupture  POST-OPERATIVE DIAGNOSIS:  left elbow biceps tendon rupture  PROCEDURE:  Procedure(s) (LRB) with comments: DISTAL BICEPS TENDON REPAIR (Left) - left elbow distal biceps tendon repair  SURGEON:  Surgeon(s) and Role:    * Nilda Simmer, MD - Primary  PHYSICIAN ASSISTANT: Kirstin Shepperson PA-C, Skip Mayer  PA-C  ASSISTANTS:  same  ANESTHESIA:   general  EBL:  Total I/O In: 1000 [I.V.:1000] Out: -   BLOOD ADMINISTERED:none  DRAINS: none   LOCAL MEDICATIONS USED:  NONE  SPECIMEN:  No Specimen  DISPOSITION OF SPECIMEN:  N/A  COUNTS:  YES  TOURNIQUET:   Total Tourniquet Time Documented: Upper Arm (Left) - 29 minutes  DICTATION: .Note written in EPIC  PLAN OF CARE: Admit for overnight observation  PATIENT DISPOSITION:  PACU - hemodynamically stable.   Delay start of Pharmacological VTE agent (>24hrs) due to surgical blood loss or risk of bleeding: not applicable

## 2012-02-21 MED ORDER — HYDROCODONE-ACETAMINOPHEN 10-325 MG PO TABS: ORAL_TABLET | ORAL | Status: DC

## 2012-02-21 NOTE — Discharge Summary (Signed)
Patient ID: [name] [name] MRN: [id number] DOB/AGE: 1965/11/16 46 y.o.  Admit date: 02/20/2012 Discharge date: 02/21/2012  Admission Diagnoses:  Principal Problem:  *Traumatic rupture of distal biceps tendon Active Problems:  GERD  Obesity  Status post splenectomy  History of ITP  OSA on CPAP  Depression   Discharge Diagnoses:  Same  Past Medical History  Diagnosis Date  . Sleep apnea     cpap  . GERD (gastroesophageal reflux disease)     erosive reflux esophagitis, small HH  . Thyroid disease   . Back pain, chronic 02/18/11    BULGING DISK  . IBS (irritable bowel syndrome)   . Hemorrhoids   . S/P colonoscopy 08/2008    int/ext hemorrhoids. friable, rectal polyp  . Tubular adenoma of colon 2000  . Hyperlipemia   . S/P endoscopy 01/10/09    Dr Elmer Ramp, small HH  . Low serum cortisol level     Dr Horald Pollen  . UTI (lower urinary tract infection)   . Lower extremity edema   . Plantar fasciitis     Surgeries: Procedure(s): DISTAL BICEPS TENDON REPAIR on 02/20/2012   Consultants:    Discharged Condition: Improved  Hospital Course: JOSPH NORFLEET is an 46 y.o. male who was admitted 02/20/2012 for operative treatment ofTraumatic rupture of distal biceps tendon. Patient has severe unremitting pain that affects sleep, daily activities, and work/hobbies. After pre-op clearance the patient was taken to the operating room on 02/20/2012 and underwent  Procedure(s): DISTAL BICEPS TENDON REPAIR.    Patient was given perioperative antibiotics: Anti-infectives     Start     Dose/Rate Route Frequency Ordered Stop   02/20/12 1645   ceFAZolin (ANCEF) IVPB 2 g/50 mL premix        2 g 100 mL/hr over 30 Minutes Intravenous Every 6 hours 02/20/12 1630 02/21/12 0520   02/20/12 1054   ceFAZolin (ANCEF) 3 g in dextrose 5 % 50 mL IVPB        3 g 160 mL/hr over 30 Minutes Intravenous 60 min pre-op 02/20/12 1054 02/20/12 1305             Patient benefited maximally from hospital stay  and there were no complications.    Recent vital signs: Patient Vitals for the past 24 hrs:  BP Temp Temp src Pulse Resp SpO2 Height Weight  02/21/12 0600 125/48 mmHg 97.8 F (36.6 C) - 88  20  97 % - -  02/21/12 0000 - - - - - 94 % - -  02/20/12 2230 - - - - - 94 % - -  02/20/12 2115 128/54 mmHg 98.1 F (36.7 C) - 103  20  94 % - -  02/20/12 1930 145/80 mmHg - - 103  20  98 % - -  02/20/12 1830 149/84 mmHg - - 100  16  97 % - -  02/20/12 1729 130/70 mmHg - - 70  16  97 % - -  02/20/12 1630 136/72 mmHg 98.9 F (37.2 C) - 72  16  97 % - -  02/20/12 1600 140/74 mmHg - - 94  22  94 % - -  02/20/12 1545 128/107 mmHg - - 92  22  99 % - -  02/20/12 1530 132/70 mmHg - - 86  18  100 % - -  02/20/12 1518 150/76 mmHg 98.5 F (36.9 C) - 88  - 94 % - -  02/20/12 1145 116/60 mmHg - - 64  20  100 % - -  02/20/12 1134 118/66 mmHg - - 73  11  98 % - -  02/20/12 1130 - - - 70  20  94 % - -  02/20/12 1036 132/86 mmHg 98.2 F (36.8 C) Oral 69  20  97 % 5\' 7"  (1.702 m) 136.986 kg (302 lb)     Recent laboratory studies:  Basename 02/20/12 1119  WBC --  HGB 16.3  HCT 48.0  PLT --  NA 143  K 4.3  CL 108  CO2 --  BUN 15  CREATININE 0.90  GLUCOSE 103*  INR --  CALCIUM --     Discharge Medications:     Medication List     As of 02/21/2012  6:55 AM    STOP taking these medications         meloxicam 7.5 MG tablet   Commonly known as: MOBIC      TAKE these medications         citalopram 10 MG tablet   Commonly known as: CELEXA   TAKE 3 TABLETS EVERY DAY      citalopram 10 MG tablet   Commonly known as: CELEXA   Take 1 tablet (10 mg total) by mouth 3 (three) times daily. TAKE 3 TABLETS EVERY DAY      furosemide 40 MG tablet   Commonly known as: LASIX   Take 1 tablet (40 mg total) by mouth daily. For EDEMA.  Need to check for potassium getting low if you stay on this constantly.  Low Potassium can make you feel very bad.      gabapentin 300 MG capsule   Commonly known as:  NEURONTIN   Take 1 capsule (300 mg total) by mouth 3 (three) times daily.      HYDROcodone-acetaminophen 10-325 MG per tablet   Commonly known as: NORCO   1-2 tab po q 4-6 hrs prn pain      hydrocortisone 20 MG tablet   Commonly known as: CORTEF   Take 0.5 tablets (10 mg total) by mouth daily. For cortisol replacement      Mens Multivitamin Plus Tabs   Take 1 tablet by mouth daily. /For nutritional supplementation.      methimazole 10 MG tablet   Commonly known as: TAPAZOLE   Take 10 mg by mouth 3 (three) times daily.      methocarbamol 750 MG tablet   Commonly known as: ROBAXIN   Take 1 tablet (750 mg total) by mouth 2 (two) times daily. For antiinflamation      metoprolol tartrate 25 MG tablet   Commonly known as: LOPRESSOR   Take 25 mg by mouth 2 (two) times daily.      omeprazole 20 MG capsule   Commonly known as: PRILOSEC   Take 1 capsule (20 mg total) by mouth 2 (two) times daily. For control of stomach acid secretion and helps GERD.      simvastatin 20 MG tablet   Commonly known as: ZOCOR   Take 1 tablet (20 mg total) by mouth at bedtime. To help lower cholesterol.      Tamsulosin HCl 0.4 MG Caps   Commonly known as: FLOMAX   Take 1 capsule (0.4 mg total) by mouth daily. For breathing        Diagnostic Studies: No results found.  Disposition: 01-Home or Self Care      Discharge Orders    Future Orders Please Complete By Expires   Diet - low sodium heart healthy  Call MD / Call 911      Comments:   If you experience chest pain or shortness of breath, CALL 911 and be transported to the hospital emergency room.  If you develope a fever above 101 F, pus (white drainage) or increased drainage or redness at the wound, or calf pain, call your surgeon's office.   Constipation Prevention      Comments:   Drink plenty of fluids.  Prune juice may be helpful.  You may use a stool softener, such as Colace (over the counter) 100 mg twice a day.  Use MiraLax (over  the counter) for constipation as needed.   Increase activity slowly as tolerated      Discharge instructions      Comments:   Keep bandage on arm clean and dry.   Do not remove.   Driving restrictions      Comments:   No driving for 4 weeks   Lifting restrictions      Comments:   No lifting for 8 weeks      Follow-up Information    Follow up with Nilda Simmer, MD. (as scheduled)    Contact information:   [facility] [address] [place] [address] [phone]           Signed: Pascal Lux 02/21/2012, 6:55 AM

## 2012-02-22 ENCOUNTER — Encounter (HOSPITAL_BASED_OUTPATIENT_CLINIC_OR_DEPARTMENT_OTHER): Payer: Self-pay | Admitting: Orthopedic Surgery

## 2012-02-22 NOTE — Op Note (Signed)
[name] [name], [name] [name] NO.:  [id number]  MEDICAL RECORD NO.:  [id number]  LOCATION:                                 FACILITY:  PHYSICIAN:  Janille Draughon A. Thurston Hole, M.D. DATE OF BIRTH:  1965/07/29  DATE OF PROCEDURE:  02/20/2012 DATE OF DISCHARGE:                              OPERATIVE REPORT   PREOPERATIVE DIAGNOSIS:  Left elbow distal biceps tendon rupture.  POSTOPERATIVE DIAGNOSIS:  Left elbow distal biceps tendon rupture.  PROCEDURE:  Left elbow distal biceps tendon rupture repair.  SURGEON:  Elana Alm. Thurston Hole, M.D.  ASSISTING:  Julien Girt, PA-C and Skip Mayer, [place].  ANESTHESIA:  General.  OPERATIVE TIME:  1 hour and 15 minutes.  COMPLICATIONS:  None.  INDICATIONS FOR PROCEDURE:  [name] [name] is a 46 year old gentleman who sustained a work-related distal biceps tendon rupture to his left elbow approximately three and half weeks ago.  Exam and MRI has revealed a complete rupture of distal biceps tendon.  He is now here to undergo primary repair of this.  DESCRIPTION OF PROCEDURE:  [name] [name] was brought to the operating room on February 20, 2012, after an axillary block had been placed in the holding by Anesthesia.  He received Ancef 3 g IV preoperatively for prophylaxis.  After being placed under general, he received 100 mg of hydrocortisone as well due to his adrenal insufficiency.  His left elbow was examined.  He had full range of motion with absence of the distal biceps tendon insertion into the bicipital tuberosity at the radius, this was noted on exam.  His left arm was then prepped using sterile DuraPrep and draped using sterile technique.  Time-out of procedure was called and the correct left arm identified.  The arm was exsanguinated and tourniquet elevated to 175 mm.  Initially, through a 6-cm curvilinear incision based in the antecubital fossa initial exposure was made.  The underlying subcutaneous tissues were incised along with  skin incision.  The neurovascular structures anteriorly carefully protected as well as the antecubital veins. The proximal aspect of the incision was carefully extended such that the retracted biceps tendon could be visualized and brought out into the wound. It was sharply debrided distally in order to be reattached and then a #2 FiberWire suture using a weaving and locking technique was weaved through the distal end of this tendon.  At this point, careful dissection was carried down to the bicipital tuberosity of the proximal radius protecting the neurovascular bundles. The actual bicipital tuberosity was confirmed with intraoperative fluoroscopy.  After this was done, a guide pin of the Arthrex TightRope set was then drilled into the bicipital tuberosity and then drilled through the far-end of this to hold this pin in position while the proximal cortex was over drilled with an 8 mm drill.  After this was done, the biceps tendon along with the TightRope EndoButton was deployed into the distal cortex and locked into position there and confirmed with intraoperative fluoroscopy.  At this point, with the elbow in 80 degrees of flexion, the biceps tendon was delivered down through to the hole made into the bicipital tuberosity of the radius. It was held in  position there while an Arthrex 8 mm x 12 mm Interference Screw was screwed into position well.  After this was done, again intraoperative fluoroscopy confirmed satisfactory position of the EndoButton.  The elbow could be brought to 70 degrees of flexion before any extensive tension was noted on repair.  At this point, the tourniquet had been previously released.  Small bleeders were cauterized.  No other excessive bleedings were noted.  The wound was irrigated and then the wound was closed with 0 Vicryl in the subcutaneous tissues, 2-0 Vicryl and 4-0 Monocryl in the subcuticular layer.  Sterile dressings and a long-arm splint at 90 degrees  of flexion and slight supination was placed.  The patient was then awakened, extubated, and taken to recovery in stable condition.  Needle, sponge count was correct x2 at the end of the case.  FOLLOWUP CARE:  [name] [name] will be followed overnight at the [facility] for IV pain control, neurovascular monitoring, and oxygenation monitoring.  He will be discharged tomorrow on Norco and Robaxin, and a long-arm splint.  He will be seen back in the office in a week for wound check follow up.     [name] [name] A. Thurston Hole, M.D.     RAW/MEDQ  D:  02/20/2012  T:  02/21/2012  Job:  [id number]  cc:   [employer]

## 2012-02-26 ENCOUNTER — Encounter (HOSPITAL_BASED_OUTPATIENT_CLINIC_OR_DEPARTMENT_OTHER): Payer: Self-pay

## 2012-10-14 ENCOUNTER — Ambulatory Visit (INDEPENDENT_AMBULATORY_CARE_PROVIDER_SITE_OTHER): Payer: BC Managed Care – PPO | Admitting: Urology

## 2012-10-14 DIAGNOSIS — E291 Testicular hypofunction: Secondary | ICD-10-CM

## 2012-10-14 DIAGNOSIS — R3915 Urgency of urination: Secondary | ICD-10-CM

## 2012-10-14 DIAGNOSIS — Z125 Encounter for screening for malignant neoplasm of prostate: Secondary | ICD-10-CM

## 2012-12-16 ENCOUNTER — Encounter (HOSPITAL_COMMUNITY): Payer: Self-pay

## 2012-12-16 ENCOUNTER — Inpatient Hospital Stay (HOSPITAL_COMMUNITY)
Admission: EM | Admit: 2012-12-16 | Discharge: 2012-12-19 | DRG: 320 | Disposition: A | Discharge status: Home / Self Care | Payer: BC Managed Care – PPO | Attending: Internal Medicine | Admitting: Internal Medicine

## 2012-12-16 DIAGNOSIS — F32A Depression, unspecified: Secondary | ICD-10-CM | POA: Diagnosis present

## 2012-12-16 DIAGNOSIS — E039 Hypothyroidism, unspecified: Secondary | ICD-10-CM | POA: Diagnosis present

## 2012-12-16 DIAGNOSIS — Z6841 Body Mass Index (BMI) 40.0 and over, adult: Secondary | ICD-10-CM

## 2012-12-16 DIAGNOSIS — R739 Hyperglycemia, unspecified: Secondary | ICD-10-CM | POA: Diagnosis present

## 2012-12-16 DIAGNOSIS — K828 Other specified diseases of gallbladder: Secondary | ICD-10-CM

## 2012-12-16 DIAGNOSIS — R7309 Other abnormal glucose: Secondary | ICD-10-CM | POA: Diagnosis present

## 2012-12-16 DIAGNOSIS — K7689 Other specified diseases of liver: Secondary | ICD-10-CM | POA: Diagnosis present

## 2012-12-16 DIAGNOSIS — Z9089 Acquired absence of other organs: Secondary | ICD-10-CM

## 2012-12-16 DIAGNOSIS — I10 Essential (primary) hypertension: Secondary | ICD-10-CM | POA: Diagnosis present

## 2012-12-16 DIAGNOSIS — N390 Urinary tract infection, site not specified: Secondary | ICD-10-CM

## 2012-12-16 DIAGNOSIS — K2100 Gastro-esophageal reflux disease with esophagitis, without bleeding: Secondary | ICD-10-CM | POA: Diagnosis present

## 2012-12-16 DIAGNOSIS — R635 Abnormal weight gain: Secondary | ICD-10-CM

## 2012-12-16 DIAGNOSIS — K589 Irritable bowel syndrome without diarrhea: Secondary | ICD-10-CM | POA: Diagnosis present

## 2012-12-16 DIAGNOSIS — Z87442 Personal history of urinary calculi: Secondary | ICD-10-CM

## 2012-12-16 DIAGNOSIS — IMO0002 Reserved for concepts with insufficient information to code with codable children: Secondary | ICD-10-CM

## 2012-12-16 DIAGNOSIS — G4733 Obstructive sleep apnea (adult) (pediatric): Secondary | ICD-10-CM | POA: Diagnosis present

## 2012-12-16 DIAGNOSIS — Z87440 Personal history of urinary (tract) infections: Secondary | ICD-10-CM

## 2012-12-16 DIAGNOSIS — N12 Tubulo-interstitial nephritis, not specified as acute or chronic: Secondary | ICD-10-CM | POA: Diagnosis present

## 2012-12-16 DIAGNOSIS — K760 Fatty (change of) liver, not elsewhere classified: Secondary | ICD-10-CM

## 2012-12-16 DIAGNOSIS — E2749 Other adrenocortical insufficiency: Secondary | ICD-10-CM | POA: Diagnosis present

## 2012-12-16 DIAGNOSIS — Z79899 Other long term (current) drug therapy: Secondary | ICD-10-CM

## 2012-12-16 DIAGNOSIS — N10 Acute tubulo-interstitial nephritis: Principal | ICD-10-CM | POA: Diagnosis present

## 2012-12-16 DIAGNOSIS — K449 Diaphragmatic hernia without obstruction or gangrene: Secondary | ICD-10-CM

## 2012-12-16 DIAGNOSIS — M48061 Spinal stenosis, lumbar region without neurogenic claudication: Secondary | ICD-10-CM | POA: Diagnosis present

## 2012-12-16 DIAGNOSIS — E876 Hypokalemia: Secondary | ICD-10-CM | POA: Diagnosis present

## 2012-12-16 DIAGNOSIS — N200 Calculus of kidney: Secondary | ICD-10-CM | POA: Diagnosis present

## 2012-12-16 DIAGNOSIS — R109 Unspecified abdominal pain: Secondary | ICD-10-CM

## 2012-12-16 DIAGNOSIS — K921 Melena: Secondary | ICD-10-CM

## 2012-12-16 DIAGNOSIS — Z9989 Dependence on other enabling machines and devices: Secondary | ICD-10-CM

## 2012-12-16 DIAGNOSIS — T50905A Adverse effect of unspecified drugs, medicaments and biological substances, initial encounter: Secondary | ICD-10-CM | POA: Diagnosis present

## 2012-12-16 DIAGNOSIS — R634 Abnormal weight loss: Secondary | ICD-10-CM

## 2012-12-16 DIAGNOSIS — Z9081 Acquired absence of spleen: Secondary | ICD-10-CM

## 2012-12-16 DIAGNOSIS — F449 Dissociative and conversion disorder, unspecified: Secondary | ICD-10-CM

## 2012-12-16 DIAGNOSIS — R933 Abnormal findings on diagnostic imaging of other parts of digestive tract: Secondary | ICD-10-CM

## 2012-12-16 DIAGNOSIS — K219 Gastro-esophageal reflux disease without esophagitis: Secondary | ICD-10-CM

## 2012-12-16 DIAGNOSIS — E669 Obesity, unspecified: Secondary | ICD-10-CM | POA: Diagnosis present

## 2012-12-16 DIAGNOSIS — F329 Major depressive disorder, single episode, unspecified: Secondary | ICD-10-CM | POA: Diagnosis present

## 2012-12-16 DIAGNOSIS — Z87891 Personal history of nicotine dependence: Secondary | ICD-10-CM

## 2012-12-16 DIAGNOSIS — T380X5A Adverse effect of glucocorticoids and synthetic analogues, initial encounter: Secondary | ICD-10-CM | POA: Diagnosis present

## 2012-12-16 DIAGNOSIS — I4510 Unspecified right bundle-branch block: Secondary | ICD-10-CM | POA: Diagnosis not present

## 2012-12-16 DIAGNOSIS — E785 Hyperlipidemia, unspecified: Secondary | ICD-10-CM | POA: Diagnosis present

## 2012-12-16 DIAGNOSIS — R1310 Dysphagia, unspecified: Secondary | ICD-10-CM

## 2012-12-16 DIAGNOSIS — E2740 Unspecified adrenocortical insufficiency: Secondary | ICD-10-CM | POA: Diagnosis present

## 2012-12-16 DIAGNOSIS — Z862 Personal history of diseases of the blood and blood-forming organs and certain disorders involving the immune mechanism: Secondary | ICD-10-CM

## 2012-12-16 DIAGNOSIS — D369 Benign neoplasm, unspecified site: Secondary | ICD-10-CM

## 2012-12-16 HISTORY — DX: Calculus of kidney: N20.0

## 2012-12-16 HISTORY — DX: Spinal stenosis, lumbar region without neurogenic claudication: M48.061

## 2012-12-16 LAB — BASIC METABOLIC PANEL
CO2: 25 mEq/L (ref 19–32)
Calcium: 9.6 mg/dL (ref 8.4–10.5)
GFR calc Af Amer: >90 mL/min (ref >90)
GFR calc non Af Amer: 89 mL/min — ABNORMAL LOW (ref >90)
Sodium: 134 mEq/L — ABNORMAL LOW (ref 135–145)

## 2012-12-16 LAB — CBC WITH DIFFERENTIAL/PLATELET
Basophils Relative: 0 % (ref 0–1)
Eosinophils Absolute: 0 10*3/uL (ref 0.0–0.7)
Hemoglobin: 14.6 g/dL (ref 13.0–17.0)
Lymphocytes Relative: 6 % — ABNORMAL LOW (ref 12–46)
MCHC: 34 g/dL (ref 30.0–36.0)
Neutrophils Relative %: 85 % — ABNORMAL HIGH (ref 43–77)
RBC: 5.11 MIL/uL (ref 4.22–5.81)
Smear Review: ADEQUATE

## 2012-12-16 LAB — URINE MICROSCOPIC-ADD ON

## 2012-12-16 LAB — HEPATIC FUNCTION PANEL
Albumin: 3.9 g/dL (ref 3.5–5.2)
Alkaline Phosphatase: 64 U/L (ref 39–117)
Bilirubin, Direct: 0.1 mg/dL (ref 0.0–0.3)
Indirect Bilirubin: 0.4 mg/dL (ref 0.3–0.9)
Total Bilirubin: 0.5 mg/dL (ref 0.3–1.2)

## 2012-12-16 LAB — URINALYSIS, ROUTINE W REFLEX MICROSCOPIC
Bilirubin Urine: NEGATIVE
Nitrite: POSITIVE — AB
Urobilinogen, UA: 0.2 mg/dL (ref 0.0–1.0)

## 2012-12-16 LAB — MAGNESIUM: Magnesium: 1.9 mg/dL (ref 1.5–2.5)

## 2012-12-16 MED ORDER — SIMVASTATIN 20 MG PO TABS
20.0000 mg | ORAL_TABLET | Freq: Every day | ORAL | Status: DC
  Administered 2012-12-16 – 2012-12-18 (×3): 20 mg via ORAL
  Filled 2012-12-16 (×3): qty 1

## 2012-12-16 MED ORDER — DEXTROSE 5 % IV SOLN
1.0000 g | INTRAVENOUS | Status: DC
  Administered 2012-12-17 – 2012-12-18 (×2): 1 g via INTRAVENOUS
  Filled 2012-12-16 (×5): qty 10

## 2012-12-16 MED ORDER — DOCUSATE SODIUM 100 MG PO CAPS
100.0000 mg | ORAL_CAPSULE | Freq: Two times a day (BID) | ORAL | Status: DC
  Administered 2012-12-16 – 2012-12-19 (×6): 100 mg via ORAL
  Filled 2012-12-16 (×6): qty 1

## 2012-12-16 MED ORDER — PANTOPRAZOLE SODIUM 40 MG PO TBEC
40.0000 mg | DELAYED_RELEASE_TABLET | Freq: Every day | ORAL | Status: DC
  Administered 2012-12-17 – 2012-12-19 (×3): 40 mg via ORAL
  Filled 2012-12-16 (×3): qty 1

## 2012-12-16 MED ORDER — TRAZODONE HCL 50 MG PO TABS: 50.0000 mg | ORAL_TABLET | Freq: Every evening | ORAL | Status: DC | PRN

## 2012-12-16 MED ORDER — DEXTROSE 5 % IV SOLN
INTRAVENOUS | Status: AC
  Administered 2012-12-16: 20:00:00
  Filled 2012-12-16: qty 10

## 2012-12-16 MED ORDER — ONDANSETRON HCL 4 MG/2ML IJ SOLN
4.0000 mg | Freq: Once | INTRAMUSCULAR | Status: AC
  Administered 2012-12-16: 4 mg via INTRAVENOUS
  Filled 2012-12-16: qty 2

## 2012-12-16 MED ORDER — DIPHENHYDRAMINE HCL 50 MG/ML IJ SOLN
INTRAMUSCULAR | Status: AC
  Filled 2012-12-16: qty 1

## 2012-12-16 MED ORDER — METOPROLOL TARTRATE 25 MG PO TABS
25.0000 mg | ORAL_TABLET | Freq: Two times a day (BID) | ORAL | Status: DC
  Administered 2012-12-17 – 2012-12-19 (×5): 25 mg via ORAL
  Filled 2012-12-16 (×5): qty 1

## 2012-12-16 MED ORDER — ASPIRIN EC 81 MG PO TBEC
81.0000 mg | DELAYED_RELEASE_TABLET | Freq: Every day | ORAL | Status: DC
  Administered 2012-12-17 – 2012-12-19 (×3): 81 mg via ORAL
  Filled 2012-12-16 (×3): qty 1

## 2012-12-16 MED ORDER — ENOXAPARIN SODIUM 40 MG/0.4ML ~~LOC~~ SOLN
40.0000 mg | SUBCUTANEOUS | Status: DC
  Administered 2012-12-17 – 2012-12-18 (×2): 40 mg via SUBCUTANEOUS
  Filled 2012-12-16 (×3): qty 0.4

## 2012-12-16 MED ORDER — MORPHINE SULFATE 4 MG/ML IJ SOLN
6.0000 mg | Freq: Once | INTRAMUSCULAR | Status: AC
  Administered 2012-12-16: 6 mg via INTRAVENOUS
  Filled 2012-12-16: qty 2

## 2012-12-16 MED ORDER — TAMSULOSIN HCL 0.4 MG PO CAPS
0.4000 mg | ORAL_CAPSULE | Freq: Every day | ORAL | Status: DC
  Administered 2012-12-17 – 2012-12-19 (×3): 0.4 mg via ORAL
  Filled 2012-12-16 (×3): qty 1

## 2012-12-16 MED ORDER — LEVOTHYROXINE SODIUM 75 MCG PO TABS
75.0000 ug | ORAL_TABLET | Freq: Every day | ORAL | Status: DC
  Administered 2012-12-17 – 2012-12-19 (×3): 75 ug via ORAL
  Filled 2012-12-16 (×3): qty 1

## 2012-12-16 MED ORDER — ACETAMINOPHEN 325 MG PO TABS
650.0000 mg | ORAL_TABLET | ORAL | Status: DC | PRN
  Administered 2012-12-17: 650 mg via ORAL
  Filled 2012-12-16: qty 2

## 2012-12-16 MED ORDER — DEXTROSE 5 % IV SOLN
1.0000 g | Freq: Once | INTRAVENOUS | Status: AC
  Filled 2012-12-16 (×2): qty 10

## 2012-12-16 MED ORDER — SODIUM CHLORIDE 0.9 % IJ SOLN
3.0000 mL | Freq: Two times a day (BID) | INTRAMUSCULAR | Status: DC
  Administered 2012-12-17: 3 mL via INTRAVENOUS

## 2012-12-16 MED ORDER — ONDANSETRON HCL 4 MG/2ML IJ SOLN: 4.0000 mg | INTRAMUSCULAR | Status: DC | PRN

## 2012-12-16 MED ORDER — DIPHENHYDRAMINE HCL 50 MG/ML IJ SOLN: 25.0000 mg | Freq: Once | INTRAMUSCULAR | Status: AC

## 2012-12-16 MED ORDER — FLEET ENEMA 7-19 GM/118ML RE ENEM: 1.0000 | ENEMA | Freq: Once | RECTAL | Status: AC | PRN

## 2012-12-16 MED ORDER — POTASSIUM CHLORIDE IN NACL 20-0.9 MEQ/L-% IV SOLN
INTRAVENOUS | Status: DC
  Administered 2012-12-16 – 2012-12-18 (×5): via INTRAVENOUS

## 2012-12-16 MED ORDER — ONDANSETRON HCL 4 MG/2ML IJ SOLN: 4.0000 mg | Freq: Three times a day (TID) | INTRAMUSCULAR | Status: DC | PRN

## 2012-12-16 MED ORDER — KETOROLAC TROMETHAMINE 30 MG/ML IJ SOLN
30.0000 mg | Freq: Four times a day (QID) | INTRAMUSCULAR | Status: AC
  Administered 2012-12-16 – 2012-12-17 (×3): 30 mg via INTRAVENOUS
  Filled 2012-12-16 (×3): qty 1

## 2012-12-16 MED ORDER — KETOROLAC TROMETHAMINE 30 MG/ML IJ SOLN
30.0000 mg | Freq: Once | INTRAMUSCULAR | Status: AC
  Administered 2012-12-16: 30 mg via INTRAVENOUS
  Filled 2012-12-16: qty 1

## 2012-12-16 MED ORDER — HYDROCORTISONE 20 MG PO TABS
ORAL_TABLET | ORAL | Status: AC
  Filled 2012-12-16: qty 1

## 2012-12-16 MED ORDER — HYDROCORTISONE 20 MG PO TABS
20.0000 mg | ORAL_TABLET | Freq: Two times a day (BID) | ORAL | Status: DC
  Administered 2012-12-16: 20 mg via ORAL
  Filled 2012-12-16 (×2): qty 1

## 2012-12-16 MED ORDER — CITALOPRAM HYDROBROMIDE 20 MG PO TABS
30.0000 mg | ORAL_TABLET | Freq: Every day | ORAL | Status: DC
  Administered 2012-12-17 – 2012-12-19 (×3): 30 mg via ORAL
  Filled 2012-12-16 (×3): qty 2

## 2012-12-16 MED ORDER — SODIUM CHLORIDE 0.9 % IV SOLN: INTRAVENOUS | Status: DC

## 2012-12-16 MED ORDER — SODIUM CHLORIDE 0.9 % IV BOLUS (SEPSIS)
1000.0000 mL | Freq: Once | INTRAVENOUS | Status: AC
  Administered 2012-12-16: 1000 mL via INTRAVENOUS

## 2012-12-16 MED ORDER — ACETAMINOPHEN 500 MG PO TABS
1000.0000 mg | ORAL_TABLET | Freq: Once | ORAL | Status: AC
  Administered 2012-12-16: 1000 mg via ORAL
  Filled 2012-12-16: qty 2

## 2012-12-16 MED ORDER — POTASSIUM CHLORIDE CRYS ER 20 MEQ PO TBCR
40.0000 meq | EXTENDED_RELEASE_TABLET | ORAL | Status: AC
  Administered 2012-12-16 – 2012-12-17 (×3): 40 meq via ORAL
  Filled 2012-12-16 (×3): qty 2

## 2012-12-16 NOTE — H&P (Signed)
[facility] History and Physical  [name] [name]  ZOX:[id number]  DOB: January 05, 1966   DOA: 12/16/2012   PCP:   Kirk Ruths, MD  Urologist: Dr. Lynnae Sandhoff Endocrinologist: Dr. Lurene Shadow  Chief Complaint:  Urinary frequency since last night  HPI: [name] [name] is a 47 y.o. male.  Severely obese Caucasian gentleman with a known history of nephrolithiasis and recurrent urinary tract infections of unclear etiology presents with urinary frequency dysuria and fever up to 102 since 11 PM last night. Was also associated bilateral flank pain with intermittent radiating around to navel.  He reports his doctor usually advised them to keep the prescription of Cipro available, but he had none. He eventually was able to contact his urologist and got prescription for Cipro called in.  He took a dose of Cipro at 3 PM but because of worsening symptoms by 5 PM he came to the emergency room.  Hospitalist service was called for admission  En route to his admission bed patient began to complain of chest discomfort difficulty breathing and feeling disoriented.  Throughout the present interview the patient looked quite comfortable, without acute complaints.  He also has a history of major depressive disorder, obstructive sleep apnea, and hypoadrenalism, managed with low dose cortisone  Rewiew of Systems:   All systems negative except as marked bold or noted in the HPI;  Constitutional:    malaise, fever and chills. ;  Eyes:   eye pain, redness and discharge. ;  ENMT:   ear pain, hoarseness, nasal congestion, sinus pressure and sore throat. ;  Cardiovascular:    chest pain, palpitations, diaphoresis, dyspnea and peripheral edema.  Respiratory:   cough, hemoptysis, wheezing and stridor. ;  Gastrointestinal:  nausea, vomiting, diarrhea, constipation, abdominal pain, melena, blood in stool, hematemesis, jaundice and rectal bleeding. unusual weight loss..   Genitourinary:    frequency, dysuria,  incontinence,flank pain and hematuria; Musculoskeletal:   back pain and neck pain.  swelling and trauma.;  Skin: .  pruritus, rash, abrasions, bruising and skin lesion.; ulcerations Neuro:    headache, lightheadedness and neck stiffness.  weakness, altered level of consciousness, altered mental status, extremity weakness, burning feet, involuntary movement, seizure and syncope.  Psych:    anxiety, depression, insomnia, tearfulness, panic attacks, hallucinations, paranoia, suicidal or homicidal ideation    Past Medical History  Diagnosis Date  . Sleep apnea     cpap  . GERD (gastroesophageal reflux disease)     erosive reflux esophagitis, small HH  . Thyroid disease   . Back pain, chronic 02/18/11    BULGING DISK  . IBS (irritable bowel syndrome)   . Hemorrhoids   . S/P colonoscopy 08/2008    int/ext hemorrhoids. friable, rectal polyp  . Tubular adenoma of colon 2000  . Hyperlipemia   . S/P endoscopy 01/10/09    Dr Elmer Ramp, small HH  . Low serum cortisol level     Dr Horald Pollen  . UTI (lower urinary tract infection)   . Lower extremity edema   . Plantar fasciitis     Past Surgical History  Procedure Laterality Date  . Splenectomy  2003    itp  . Cholecystectomy  2006  . Knee surgery      left  . Distal biceps tendon repair  02/20/2012    Procedure: DISTAL BICEPS TENDON REPAIR;  Surgeon: Nilda Simmer, MD;  Location: [facility];  Service: Orthopedics;  Laterality: Left;  left elbow distal biceps tendon repair    Medications:  HOME MEDS: Prior to Admission medications   Medication Sig Start Date End Date Taking? Authorizing Provider  aspirin EC 81 MG tablet Take 81 mg by mouth daily.   Yes Historical Provider, MD  citalopram (CELEXA) 10 MG tablet Take 30 mg by mouth daily.   Yes Historical Provider, MD  hydrocortisone (CORTEF) 20 MG tablet Take 0.5 tablets (10 mg total) by mouth daily. For cortisol replacement 08/23/11  Yes Mike Craze, MD  levothyroxine  (SYNTHROID, LEVOTHROID) 75 MCG tablet Take 75 mcg by mouth daily before breakfast.   Yes Historical Provider, MD  methocarbamol (ROBAXIN) 750 MG tablet Take 750 mg by mouth every 6 (six) hours as needed. Muscle spasm   Yes Historical Provider, MD  metoprolol (LOPRESSOR) 50 MG tablet Take 25 mg by mouth 2 (two) times daily.   Yes Historical Provider, MD  Multiple Vitamins-Minerals (MENS MULTIVITAMIN PLUS) TABS Take 1 tablet by mouth daily. /For nutritional supplementation. 08/23/11  Yes Mike Craze, MD  omeprazole (PRILOSEC) 40 MG capsule Take 40 mg by mouth daily.   Yes Historical Provider, MD  simvastatin (ZOCOR) 20 MG tablet Take 1 tablet (20 mg total) by mouth at bedtime. To help lower cholesterol. 08/23/11  Yes Mike Craze, MD  Tamsulosin HCl (FLOMAX) 0.4 MG CAPS Take 1 capsule (0.4 mg total) by mouth daily. For breathing 08/23/11  Yes Mike Craze, MD     Allergies:  Allergies  Allergen Reactions  . Hydromorphone Hcl Other (See Comments)    Feels like chest will bust open     Social History:   reports that he quit smoking about 5 years ago. His smoking use included Cigarettes. He has a 30 pack-year smoking history. He does not have any smokeless tobacco history on file. He reports that he does not drink alcohol or use illicit drugs.  Family History: Family History  Problem Relation Age of Onset  . Colon cancer Father 19  . Diabetes Brother      Physical Exam: Filed Vitals:   12/16/12 1736 12/16/12 1901 12/16/12 1912  BP: 131/64 119/68 119/68  Pulse: 102 95 90  Temp: 101.7 F (38.7 C)  98.9 F (37.2 C)  TempSrc: Oral  Oral  Resp: 20 18 19   Height: 5\' 7"  (1.702 m)    Weight: 148.78 kg (328 lb)    SpO2: 95% 94% 94%   Blood pressure 119/68, pulse 90, temperature 98.9 F (37.2 C), temperature source Oral, resp. rate 19, height 5\' 7"  (1.702 m), weight 148.78 kg (328 lb), SpO2 94.00%. Body mass index is 51.36 kg/(m^2).   GEN:  Pleasant severely obese Caucasian  gentleman lying bed in no acute distress; cooperative with exam PSYCH:  alert and oriented x4;  depressed-looking; affect is appropriate. HEENT: Mucous membranes pink, dry and anicteric; PERRLA; EOM intact; thick neck  Breasts:: Not examined CHEST WALL: No tenderness CHEST: Normal respiration, clear to auscultation bilaterally HEART: Regular rate and rhythm; no murmurs rubs or gallops BACK: Bilateral CVA tenderness ABDOMEN: Obese, soft non-tender;  normal abdominal bowel sounds; no intertriginous candida. Rectal Exam: Not done EXTREMITIES: No bone or joint deformity;  no edema; no ulcerations. Genitalia: not examined PULSES: 2+ and symmetric SKIN: Normal hydration no rash or ulceration CNS: Cranial nerves 2-12 grossly intact no focal lateralizing neurologic deficit   Labs on Admission:  Basic Metabolic Panel:  Recent Labs Lab 12/16/12 1815  NA 134*  K 3.2*  CL 99  CO2 25  GLUCOSE 126*  BUN 13  CREATININE  1.00  CALCIUM 9.6   Liver Function Tests: No results found for this basename: AST, ALT, ALKPHOS, BILITOT, PROT, ALBUMIN,  in the last 168 hours No results found for this basename: LIPASE, AMYLASE,  in the last 168 hours No results found for this basename: AMMONIA,  in the last 168 hours CBC:  Recent Labs Lab 12/16/12 1815  WBC 25.8*  NEUTROABS 22.0*  HGB 14.6  HCT 43.0  MCV 84.1  PLT 391   Cardiac Enzymes: No results found for this basename: CKTOTAL, CKMB, CKMBINDEX, TROPONINI,  in the last 168 hours BNP: No components found with this basename: POCBNP,  D-dimer: No components found with this basename: D-DIMER,  CBG: No results found for this basename: GLUCAP,  in the last 168 hours  Radiological Exams on Admission: No results found.  EKG: Independently reviewed.    Assessment/Plan  Active Problems:   Pyelonephritis   Obesity   OSA on CPAP   Depression   Hypokalemia   PLAN: Admit this gentleman for initiation of IV fluid hydration and  continuation of Rocephin intravenously pending results of urine cultures. He did take a dose of Cipro before coming to the hospital and that might affect urine cultures.  Plain CT scan of the abdomen and pelvis to rule out renal colic because of his description of the pain  Replete potassium  Patient may use home CPAP device  Continue antidepressant  Monitor EKG and cardiac enzymes because of episode of chest discomfort although this sounds quite benign, she is at higher risk because of his obesity  Discuss dietary changes, and initiation of deliberate regular exercise, target weight loss of 1-2 pounds per week.  Other plans as per orders.  Code Status: Full Family Communication: Plans discuss with patientat bedside Disposition Plan: Likely home within a day or    [name] [name] Nocturnist [facility] Pager [phone]   12/16/2012, 8:48 PM

## 2012-12-16 NOTE — ED Provider Notes (Signed)
History  This chart was scribed for Benny Lennert, MD, by Candelaria Stagers, ED Scribe. This patient was seen in room APA11/APA11 and the patient's care was started at 6:00 PM  CSN: [id number] Arrival date & time 12/16/12  1724  First MD Initiated Contact with Patient 12/16/12 1755     Chief Complaint  Patient presents with  . Fever  . frequent urination   . Chest Pain    The history is provided by the patient. No language interpreter was used.   HPI Comments: [name] [name] is a 47 y.o. male who presents to the Emergency Department complaining of dysuria and lower back pain that radiates to abdomen which started yesterday.  Pt has h/o kidney stones and reports these sx feel similar.  He is also experiencing fever and headache.  His temp in ED is 101.7.  Nothing seems to make the sx better or worse.  He has taken ibuprofen with no relief.   Past Medical History  Diagnosis Date  . Sleep apnea     cpap  . GERD (gastroesophageal reflux disease)     erosive reflux esophagitis, small HH  . Thyroid disease   . Back pain, chronic 02/18/11    BULGING DISK  . IBS (irritable bowel syndrome)   . Hemorrhoids   . S/P colonoscopy 08/2008    int/ext hemorrhoids. friable, rectal polyp  . Tubular adenoma of colon 2000  . Hyperlipemia   . S/P endoscopy 01/10/09    Dr Elmer Ramp, small HH  . Low serum cortisol level     Dr Horald Pollen  . UTI (lower urinary tract infection)   . Lower extremity edema   . Plantar fasciitis    Past Surgical History  Procedure Laterality Date  . Splenectomy  2003    itp  . Cholecystectomy  2006  . Knee surgery      left  . Distal biceps tendon repair  02/20/2012    Procedure: DISTAL BICEPS TENDON REPAIR;  Surgeon: Nilda Simmer, MD;  Location: [facility];  Service: Orthopedics;  Laterality: Left;  left elbow distal biceps tendon repair   Family History  Problem Relation Age of Onset  . Colon cancer Father 87  . Diabetes Brother    History   Substance Use Topics  . Smoking status: Former Smoker -- 1.00 packs/day for 30 years    Types: Cigarettes    Quit date: 03/05/2007  . Smokeless tobacco: Not on file  . Alcohol Use: No     Comment: hx heavy etoh, quit 4 y ago    Review of Systems  Constitutional: Positive for fever.  Gastrointestinal: Positive for abdominal pain.  Genitourinary: Positive for dysuria.  Musculoskeletal: Positive for back pain (lower back pain).  All other systems reviewed and are negative.    Allergies  Hydromorphone hcl  Home Medications   Current Outpatient Rx  Name  Route  Sig  Dispense  Refill  . citalopram (CELEXA) 10 MG tablet      TAKE 3 TABLETS EVERY DAY   90 tablet   0   . citalopram (CELEXA) 10 MG tablet   Oral   Take 1 tablet (10 mg total) by mouth 3 (three) times daily. TAKE 3 TABLETS EVERY DAY   270 tablet   1   . gabapentin (NEURONTIN) 300 MG capsule   Oral   Take 1 capsule (300 mg total) by mouth 3 (three) times daily.   270 capsule   0   .  HYDROcodone-acetaminophen (NORCO) 10-325 MG per tablet      1-2 tab po q 4-6 hrs prn pain   40 tablet   0     Called into [facility] in [place]   . hydrocortisone (CORTEF) 20 MG tablet   Oral   Take 0.5 tablets (10 mg total) by mouth daily. For cortisol replacement   15 tablet   0   . methimazole (TAPAZOLE) 10 MG tablet   Oral   Take 10 mg by mouth 3 (three) times daily.         . methocarbamol (ROBAXIN) 750 MG tablet   Oral   Take 1 tablet (750 mg total) by mouth 2 (two) times daily. For antiinflamation   60 tablet   0   . metoprolol tartrate (LOPRESSOR) 25 MG tablet   Oral   Take 25 mg by mouth 2 (two) times daily.         . Multiple Vitamins-Minerals (MENS MULTIVITAMIN PLUS) TABS   Oral   Take 1 tablet by mouth daily. /For nutritional supplementation.   30 tablet   0   . omeprazole (PRILOSEC) 20 MG capsule   Oral   Take 1 capsule (20 mg total) by mouth 2 (two) times daily. For control of stomach acid  secretion and helps GERD.   60 capsule   0   . simvastatin (ZOCOR) 20 MG tablet   Oral   Take 1 tablet (20 mg total) by mouth at bedtime. To help lower cholesterol.   30 tablet   0   . Tamsulosin HCl (FLOMAX) 0.4 MG CAPS   Oral   Take 1 capsule (0.4 mg total) by mouth daily. For breathing   30 capsule   0    BP 131/64  Pulse 102  Temp(Src) 101.7 F (38.7 C) (Oral)  Resp 20  Ht 5\' 7"  (1.702 m)  Wt 328 lb (148.78 kg)  BMI 51.36 kg/m2  SpO2 95% Physical Exam  Nursing note and vitals reviewed. Constitutional: He is oriented to person, place, and time. He appears well-developed and well-nourished. No distress.  HENT:  Head: Normocephalic and atraumatic.  Eyes: EOM are normal.  Neck: Neck supple. No tracheal deviation present.  Cardiovascular: Normal rate.   Pulmonary/Chest: Effort normal. No respiratory distress.  Abdominal: There is tenderness (mild diffuse tenderness).  Musculoskeletal: Normal range of motion.  Neurological: He is alert and oriented to person, place, and time.  Skin: Skin is warm and dry.  Psychiatric: He has a normal mood and affect. His behavior is normal.    ED Course  Procedures  DIAGNOSTIC STUDIES: Oxygen Saturation is 95% on room air, normal by my interpretation.    COORDINATION OF CARE:  6:03 PM Discussed course of care with pt which includes urinalysis.  Pt understands and agrees.  7:51 PM Discussed lab results with pt.  Pt reports no improvement of back pain.  Will admit pt.  Pt understands and agrees.   Labs Reviewed  URINALYSIS, ROUTINE W REFLEX MICROSCOPIC - Abnormal; Notable for the following:    Specific Gravity, Urine >1.030 (*)    Hgb urine dipstick MODERATE (*)    Protein, ur TRACE (*)    Nitrite POSITIVE (*)    Leukocytes, UA SMALL (*)    All other components within normal limits  CBC WITH DIFFERENTIAL - Abnormal; Notable for the following:    WBC 25.8 (*)    Neutrophils Relative % 85 (*)    Lymphocytes Relative 6 (*)  Neutro Abs 22.0 (*)    Monocytes Absolute 2.3 (*)    All other components within normal limits  BASIC METABOLIC PANEL - Abnormal; Notable for the following:    Sodium 134 (*)    Potassium 3.2 (*)    Glucose, Bld 126 (*)    GFR calc non Af Amer 89 (*)    All other components within normal limits  URINE MICROSCOPIC-ADD ON - Abnormal; Notable for the following:    Squamous Epithelial / LPF FEW (*)    Bacteria, UA FEW (*)    All other components within normal limits  URINE CULTURE   No results found. No diagnosis found.  MDM   The chart was scribed for me under my direct supervision.  I personally performed the history, physical, and medical decision making and all procedures in the evaluation of this patient.Benny Lennert, MD 12/16/12 2013

## 2012-12-16 NOTE — ED Notes (Signed)
Pt reports frequent urination since last night, fever this am.  C/O pain in back and abd.  Reports history of kidney stones.   Wife says for some reason every year his cortisol level drops.  Pt had cortisol level drawn around 3pm today.  Also c/o headache and chest hurting.

## 2012-12-17 ENCOUNTER — Observation Stay (HOSPITAL_COMMUNITY): Payer: BC Managed Care – PPO

## 2012-12-17 DIAGNOSIS — R7309 Other abnormal glucose: Secondary | ICD-10-CM

## 2012-12-17 DIAGNOSIS — T50905A Adverse effect of unspecified drugs, medicaments and biological substances, initial encounter: Secondary | ICD-10-CM | POA: Diagnosis present

## 2012-12-17 DIAGNOSIS — I517 Cardiomegaly: Secondary | ICD-10-CM

## 2012-12-17 DIAGNOSIS — T50904A Poisoning by unspecified drugs, medicaments and biological substances, undetermined, initial encounter: Secondary | ICD-10-CM

## 2012-12-17 DIAGNOSIS — E2740 Unspecified adrenocortical insufficiency: Secondary | ICD-10-CM | POA: Diagnosis present

## 2012-12-17 DIAGNOSIS — E2749 Other adrenocortical insufficiency: Secondary | ICD-10-CM

## 2012-12-17 DIAGNOSIS — E039 Hypothyroidism, unspecified: Secondary | ICD-10-CM

## 2012-12-17 DIAGNOSIS — I4510 Unspecified right bundle-branch block: Secondary | ICD-10-CM

## 2012-12-17 LAB — CBC
Hemoglobin: 12.9 g/dL — ABNORMAL LOW (ref 13.0–17.0)
MCH: 28.5 pg (ref 26.0–34.0)
MCHC: 33.6 g/dL (ref 30.0–36.0)
RDW: 15.2 % (ref 11.5–15.5)

## 2012-12-17 LAB — BASIC METABOLIC PANEL
BUN: 15 mg/dL (ref 6–23)
Creatinine, Ser: 0.99 mg/dL (ref 0.50–1.35)
GFR calc non Af Amer: >90 mL/min (ref >90)
Glucose, Bld: 178 mg/dL — ABNORMAL HIGH (ref 70–99)
Potassium: 3.7 mEq/L (ref 3.5–5.1)

## 2012-12-17 LAB — TROPONIN I: Troponin I: <0.3 ng/mL (ref <0.30)

## 2012-12-17 LAB — HEMOGLOBIN A1C: Hgb A1c MFr Bld: 5.8 % — ABNORMAL HIGH (ref <5.7)

## 2012-12-17 MED ORDER — MORPHINE SULFATE 2 MG/ML IJ SOLN
2.0000 mg | INTRAMUSCULAR | Status: DC | PRN
  Administered 2012-12-17 – 2012-12-19 (×9): 2 mg via INTRAVENOUS
  Filled 2012-12-17 (×9): qty 1

## 2012-12-17 MED ORDER — OXYCODONE-ACETAMINOPHEN 5-325 MG PO TABS
1.0000 | ORAL_TABLET | ORAL | Status: DC | PRN
  Administered 2012-12-17 – 2012-12-19 (×3): 1 via ORAL
  Filled 2012-12-17 (×3): qty 1

## 2012-12-17 MED ORDER — MUSCLE RUB 10-15 % EX CREA
TOPICAL_CREAM | Freq: Two times a day (BID) | CUTANEOUS | Status: DC
  Administered 2012-12-17: 10:00:00 via TOPICAL
  Filled 2012-12-17: qty 85

## 2012-12-17 MED ORDER — HYDROCORTISONE 20 MG PO TABS
40.0000 mg | ORAL_TABLET | Freq: Two times a day (BID) | ORAL | Status: DC
  Administered 2012-12-17 (×2): 40 mg via ORAL
  Filled 2012-12-17 (×5): qty 2

## 2012-12-17 MED ORDER — TROLAMINE SALICYLATE 10 % EX CREA: TOPICAL_CREAM | Freq: Two times a day (BID) | CUTANEOUS | Status: DC

## 2012-12-17 NOTE — Progress Notes (Deleted)
[facility] PROGRESS NOTE  [name] [name] ZOX:[id number] DOB: January 31, 1966 DOA: 12/16/2012 PCP: Kirk Ruths, MD  Assessment/Plan:  Pyelonephritis   Lower back pain, elevated wbc and fever  U/A positive for infection, cultures pending  CT Abd/Pelvis showed 5-mm nonobstructive left nephrolithiasis.  Continue IVF  Empiric Rocephin started 7/15  Strict Is and Os.  (ordered)  Patient reports he is not urinating much.   Has foley in place - would change to condom cath if no urinary obstruction.  New incomplete RBBB noted on last of serial EKGs  Troponin wnl  May be a transient finding  Will check another EKG   Check 2D echo given history of OSA and obesity  Adrenal Insufficiency  Cared for by Dr. Talmage Nap in [place]  On Hydrocortef 20 mg bid - will increase to 40 mg bid while he is acutely ill.  Thyroid disorder  Patient reports this has caused him to gain 100 lbs in the past year.  Continue synthroid  Severe Hepatic Steatosis  Noted on CT.  Discussed with patient.  HTN  Controlled  Metoprolol bid   DVT Prophylaxis:  Lovenox  Code Status: full Family Communication:  Disposition Plan: inpatient.  To home when appropriate   Consultants:    Procedures:  2 D echo pending  Antibiotics:  Rocephin  HPI/Subjective: Complains of lower back pain.  Reports he's gained 100 lbs in 1 yr due to thyroid.  Does not drink alcohol.  Sees Dr. Talmage Nap regularly  Objective: Filed Vitals:   12/16/12 2225 12/17/12 0217 12/17/12 0408 12/17/12 0410  BP:  115/66 132/53   Pulse:  69 63   Temp:  98.1 F (36.7 C) 97.9 F (36.6 C)   TempSrc:  Oral Oral   Resp:  24 24   Height:      Weight:    151.95 kg (334 lb 15.8 oz)  SpO2: 95% 91% 94%     Intake/Output Summary (Last 24 hours) at 12/17/12 1045 Last data filed at 12/17/12 0409  Gross per 24 hour  Intake    620 ml  Output    500 ml  Net    120 ml   Filed Weights   12/16/12 1736 12/16/12 2145 12/17/12  0410  Weight: 148.78 kg (328 lb) 151.955 kg (335 lb) 151.95 kg (334 lb 15.8 oz)    Exam:   General:  A&O, NAD, Lying comfortably in bed, CPAP in place  Cardiovascular: RRR, no murmurs, rubs or gallops, no lower extremity edema  Respiratory: CTA, no wheeze, crackles, or rales.  No increased work of breathing.  Abdomen: obese, no palpable organomegaly. Soft, non-tender, non-distended, + bowel sounds  Musculoskeletal: Able to move all 4 extremities, 5/5 strength in each  Data Reviewed: Basic Metabolic Panel:  Recent Labs Lab 12/16/12 1815 12/16/12 2244 12/17/12 0502  NA 134*  --  139  K 3.2*  --  3.7  CL 99  --  106  CO2 25  --  23  GLUCOSE 126*  --  178*  BUN 13  --  15  CREATININE 1.00  --  0.99  CALCIUM 9.6  --  8.7  MG  --  1.9  --    Liver Function Tests:  Recent Labs Lab 12/16/12 2244  AST 19  ALT 24  ALKPHOS 64  BILITOT 0.5  PROT 7.7  ALBUMIN 3.9   CBC:  Recent Labs Lab 12/16/12 1815 12/17/12 0502  WBC 25.8* 22.3*  NEUTROABS 22.0*  --   HGB 14.6  12.9*  HCT 43.0 38.4*  MCV 84.1 85.0  PLT 391 346   Cardiac Enzymes:  Recent Labs Lab 12/17/12 0502  TROPONINI <0.30     Studies: Ct Abdomen Pelvis Wo Contrast  12/17/2012   *RADIOLOGY REPORT*  Clinical Data: Fever, frequent urination, history of kidney stone.  CT ABDOMEN AND PELVIS WITHOUT CONTRAST  Technique:  Multidetector CT imaging of the abdomen and pelvis was performed following the standard protocol without intravenous contrast.  Comparison: 02/18/2011.  Findings:  BODY WALL: Unremarkable.  LOWER CHEST:  Mediastinum: Unremarkable.  Lungs/pleura: No consolidation.  ABDOMEN/PELVIS:  Liver: Diffuse low attenuation of the liver.  No focal abnormality.  Biliary: Cholecystectomy.  No biliary dilatation.  Pancreas: Unremarkable.  Spleen: Splenectomy.  Adrenals: Unremarkable.  Kidneys and ureters: 5 mm stone in the interpolar left kidney.  No hydronephrosis.  17 mm low attenuation lesion in the left  anterior hilar lip is consistent with cyst.  Bladder: Decompressed around a Foley catheter.  Bowel: No obstruction. Normal appendix.  Retroperitoneum: Chronic enlargement of the external chain lymph nodes bilaterally.  Peritoneum: No free fluid or gas.  Reproductive: Nonspecific prostate calcifications.  Vascular: Mild aortic and iliac atherosclerotic calcifications.  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  IMPRESSION:  1.  No hydronephrosis or ureteral calculus.  2.  5-mm nonobstructive left nephrolithiasis.  3.  Severe hepatic steatosis.   Original Report Authenticated By: Tiburcio Pea    Scheduled Meds: . aspirin EC  81 mg Oral Daily  . cefTRIAXone (ROCEPHIN)  IV  1 g Intravenous Q24H  . citalopram  30 mg Oral Daily  . docusate sodium  100 mg Oral BID  . enoxaparin (LOVENOX) injection  40 mg Subcutaneous Q24H  . hydrocortisone  40 mg Oral BID  . ketorolac  30 mg Intravenous Q6H  . levothyroxine  75 mcg Oral QAC breakfast  . metoprolol  25 mg Oral BID  . MUSCLE RUB   Topical BID  . pantoprazole  40 mg Oral Daily  . simvastatin  20 mg Oral QHS  . sodium chloride  3 mL Intravenous Q12H  . tamsulosin  0.4 mg Oral Daily   Continuous Infusions: . 0.9 % NaCl with KCl 20 mEq / L 150 mL/hr at 12/17/12 6295    Active Problems:   Pyelonephritis   Obesity   OSA on CPAP   Fatty liver   Depression   Hypokalemia   RBBB   Unspecified hypothyroidism   Adrenal insufficiency    Conley Canal  [facility] Pager [phone]. If 7PM-7AM, please contact night-coverage at [email or web address], password [facility] 12/17/2012, 10:45 AM  LOS: 1 day

## 2012-12-17 NOTE — Progress Notes (Signed)
[facility] PROGRESS NOTE  COLON [name] [name]:[id number] DOB: November 29, 1965 DOA: 12/16/2012 PCP: Kirk Ruths, MD  Assessment/Plan:  Pyelonephritis   Lower back pain, elevated wbc and fever  U/A positive for infection, cultures pending  CT Abd/Pelvis showed 5-mm nonobstructive left nephrolithiasis.  Continue IVF  Empiric Rocephin started 7/15 (low threshold for broadening antibiotic therapy in the setting of history of splenectomy.)  Strict Is and Os.  (ordered)  Patient reports he is not urinating much.   Has foley in place - would change to condom cath if no urinary obstruction. (Will discontinue Foley catheter and monitor for urinary retention. The patient denies any history of urinary retention).  New incomplete RBBB noted on last of serial EKGs  Troponin wnl  May be a transient finding  Will check another EKG   Check 2D echo given history of OSA and obesity  Adrenal Insufficiency  Cared for by Dr. Talmage Nap in [place]  On Hydrocortef 20 mg bid - will increase to 40 mg bid while he is acutely ill. (Will likely decrease back to home dose tomorrow. Caution in the setting of history of splenectomy.  Thyroid disorder/hypothyroidism  Patient reports this has caused him to gain 100 lbs in the past year.  Continue synthroid  Severe Hepatic Steatosis  Noted on CT.  Discussed with patient.  HTN  Controlled  Metoprolol bid  Hyperglycemia Query steroid-induced  Leukocytosis Likely multifactorial including steroids, infections, and history of splenectomy.   DVT Prophylaxis:  Lovenox  Code Status: full Family Communication:  Disposition Plan: inpatient.  To home when appropriate   Consultants:    Procedures:  2 D echo pending  Antibiotics:  Rocephin  HPI/Subjective: Complains of lower back pain.  Reports he's gained 100 lbs in 1 yr due to thyroid/hypothyroidism.  Does not drink alcohol.  Sees endocrinologist Dr. Talmage Nap  regularly.  Objective: Filed Vitals:   12/16/12 2225 12/17/12 0217 12/17/12 0408 12/17/12 0410  BP:  115/66 132/53   Pulse:  69 63   Temp:  98.1 F (36.7 C) 97.9 F (36.6 C)   TempSrc:  Oral Oral   Resp:  24 24   Height:      Weight:    151.95 kg (334 lb 15.8 oz)  SpO2: 95% 91% 94%     Intake/Output Summary (Last 24 hours) at 12/17/12 1045 Last data filed at 12/17/12 0409  Gross per 24 hour  Intake    620 ml  Output    500 ml  Net    120 ml   Filed Weights   12/16/12 1736 12/16/12 2145 12/17/12 0410  Weight: 148.78 kg (328 lb) 151.955 kg (335 lb) 151.95 kg (334 lb 15.8 oz)    Exam:   General:  A&O, NAD, Lying comfortably in bed, CPAP in place  Cardiovascular: RRR, no murmurs, rubs or gallops, no lower extremity edema  Respiratory: CTA, no wheeze, crackles, or rales.  No increased work of breathing.  Abdomen: obese, no palpable organomegaly. Soft, non-tender, non-distended, + bowel sounds  Musculoskeletal: Able to move all 4 extremities, 5/5 strength in each  Data Reviewed: Basic Metabolic Panel:  Recent Labs Lab 12/16/12 1815 12/16/12 2244 12/17/12 0502  NA 134*  --  139  K 3.2*  --  3.7  CL 99  --  106  CO2 25  --  23  GLUCOSE 126*  --  178*  BUN 13  --  15  CREATININE 1.00  --  0.99  CALCIUM 9.6  --  8.7  MG  --  1.9  --    Liver Function Tests:  Recent Labs Lab 12/16/12 2244  AST 19  ALT 24  ALKPHOS 64  BILITOT 0.5  PROT 7.7  ALBUMIN 3.9   CBC:  Recent Labs Lab 12/16/12 1815 12/17/12 0502  WBC 25.8* 22.3*  NEUTROABS 22.0*  --   HGB 14.6 12.9*  HCT 43.0 38.4*  MCV 84.1 85.0  PLT 391 346   Cardiac Enzymes:  Recent Labs Lab 12/17/12 0502  TROPONINI <0.30     Studies: Ct Abdomen Pelvis Wo Contrast  12/17/2012   *RADIOLOGY REPORT*  Clinical Data: Fever, frequent urination, history of kidney stone.  CT ABDOMEN AND PELVIS WITHOUT CONTRAST  Technique:  Multidetector CT imaging of the abdomen and pelvis was performed following  the standard protocol without intravenous contrast.  Comparison: 02/18/2011.  Findings:  BODY WALL: Unremarkable.  LOWER CHEST:  Mediastinum: Unremarkable.  Lungs/pleura: No consolidation.  ABDOMEN/PELVIS:  Liver: Diffuse low attenuation of the liver.  No focal abnormality.  Biliary: Cholecystectomy.  No biliary dilatation.  Pancreas: Unremarkable.  Spleen: Splenectomy.  Adrenals: Unremarkable.  Kidneys and ureters: 5 mm stone in the interpolar left kidney.  No hydronephrosis.  17 mm low attenuation lesion in the left anterior hilar lip is consistent with cyst.  Bladder: Decompressed around a Foley catheter.  Bowel: No obstruction. Normal appendix.  Retroperitoneum: Chronic enlargement of the external chain lymph nodes bilaterally.  Peritoneum: No free fluid or gas.  Reproductive: Nonspecific prostate calcifications.  Vascular: Mild aortic and iliac atherosclerotic calcifications.  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  IMPRESSION:  1.  No hydronephrosis or ureteral calculus.  2.  5-mm nonobstructive left nephrolithiasis.  3.  Severe hepatic steatosis.   Original Report Authenticated By: Tiburcio Pea    Scheduled Meds: . aspirin EC  81 mg Oral Daily  . cefTRIAXone (ROCEPHIN)  IV  1 g Intravenous Q24H  . citalopram  30 mg Oral Daily  . docusate sodium  100 mg Oral BID  . enoxaparin (LOVENOX) injection  40 mg Subcutaneous Q24H  . hydrocortisone  40 mg Oral BID  . ketorolac  30 mg Intravenous Q6H  . levothyroxine  75 mcg Oral QAC breakfast  . metoprolol  25 mg Oral BID  . MUSCLE RUB   Topical BID  . pantoprazole  40 mg Oral Daily  . simvastatin  20 mg Oral QHS  . sodium chloride  3 mL Intravenous Q12H  . tamsulosin  0.4 mg Oral Daily   Continuous Infusions: . 0.9 % NaCl with KCl 20 mEq / L 150 mL/hr at 12/17/12 0802    Active Problems:   Acute Pyelonephritis   Nonobstructive renal calculus    Morbid Obesity   OSA on CPAP   Fatty liver   Depression   Hypokalemia   RBBB   Unspecified hypothyroidism   Adrenal insufficiency   Hyperglycemia, presumed to be steroid induced.    [place] Elliot Cousin, M.D. [phone])  [facility] Pager [phone]. If 7PM-7AM, please contact night-coverage at [email or web address], password [facility] 12/17/2012, 10:45 AM  LOS: 1 day      Attending:  The patient was seen and examined. He was discussed with PA, Mr. Elyn Peers. The above note has been amended and/or annotated where needed. We'll continue IV fluid hydration. We'll discontinue the muscle rub ointment and K pad as he says they are not working. We'll add as needed Percocet.  We'll discontinue Foley catheter and monitor for urinary retention although the patient denies any recent history of urinary retention. Agree with 2-D echocardiogram for further workup. Will followup on the results of the hemoglobin A1c. We'll check a TSH. There will be low threshold for broadening antibiotic therapy in the setting of his history of splenectomy if he becomes febrile again.

## 2012-12-17 NOTE — Care Management Note (Signed)
    Page 1 of 1   12/19/2012     2:07:36 PM   CARE MANAGEMENT NOTE 12/19/2012  Patient:  [name] [name], [name] [name]   Account Number:  [id number]  Date Initiated:  12/17/2012  Documentation initiated by:  Sharrie Rothman  Subjective/Objective Assessment:   Pt admitted from home with pyelonephritis. Pt lives with his wife and will return home at discharge. Pt is fairly indpendent but does have a walker, cane and bipap for home use.     Action/Plan:   No CM needs noted.   Anticipated DC Date:  12/19/2012   Anticipated DC Plan:  HOME/SELF CARE      DC Planning Services  CM consult      Choice offered to / List presented to:             Status of service:  Completed, signed off Medicare Important Message given?   (If response is "NO", the following Medicare IM given date fields will be blank) Date Medicare IM given:   Date Additional Medicare IM given:    Discharge Disposition:  HOME/SELF CARE  Per UR Regulation:    If discussed at Long Length of Stay Meetings, dates discussed:    Comments:  12/17/12 1100 Arlyss Queen, RN BSN CM

## 2012-12-17 NOTE — Progress Notes (Signed)
*  PRELIMINARY RESULTS* Echocardiogram 2D Echocardiogram has been performed.  Conrad Burden 12/17/2012, 3:02 PM

## 2012-12-17 NOTE — Progress Notes (Signed)
UR chart review completed.  

## 2012-12-18 ENCOUNTER — Encounter (HOSPITAL_COMMUNITY): Payer: Self-pay | Admitting: Internal Medicine

## 2012-12-18 DIAGNOSIS — M48061 Spinal stenosis, lumbar region without neurogenic claudication: Secondary | ICD-10-CM

## 2012-12-18 DIAGNOSIS — K7689 Other specified diseases of liver: Secondary | ICD-10-CM

## 2012-12-18 HISTORY — DX: Spinal stenosis, lumbar region without neurogenic claudication: M48.061

## 2012-12-18 LAB — CBC WITH DIFFERENTIAL/PLATELET
Basophils Absolute: 0 10*3/uL (ref 0.0–0.1)
Basophils Relative: 0 % (ref 0–1)
Eosinophils Absolute: 0.1 10*3/uL (ref 0.0–0.7)
Eosinophils Relative: 0 % (ref 0–5)
HCT: 38.6 % — ABNORMAL LOW (ref 39.0–52.0)
MCHC: 33.2 g/dL (ref 30.0–36.0)
MCV: 85.2 fL (ref 78.0–100.0)
Monocytes Absolute: 1 10*3/uL (ref 0.1–1.0)
Platelets: 360 10*3/uL (ref 150–400)
RDW: 15.2 % (ref 11.5–15.5)

## 2012-12-18 LAB — URINE CULTURE: Culture: NO GROWTH

## 2012-12-18 LAB — BASIC METABOLIC PANEL
CO2: 24 mEq/L (ref 19–32)
Calcium: 8.6 mg/dL (ref 8.4–10.5)
Creatinine, Ser: 0.78 mg/dL (ref 0.50–1.35)
GFR calc non Af Amer: >90 mL/min (ref >90)
Glucose, Bld: 171 mg/dL — ABNORMAL HIGH (ref 70–99)

## 2012-12-18 MED ORDER — HYDROCORTISONE 20 MG PO TABS
20.0000 mg | ORAL_TABLET | Freq: Two times a day (BID) | ORAL | Status: DC
  Administered 2012-12-18 – 2012-12-19 (×3): 20 mg via ORAL
  Filled 2012-12-18 (×9): qty 1

## 2012-12-18 MED ORDER — DIPHENHYDRAMINE HCL 25 MG PO CAPS
25.0000 mg | ORAL_CAPSULE | Freq: Four times a day (QID) | ORAL | Status: DC | PRN
  Administered 2012-12-18: 25 mg via ORAL
  Filled 2012-12-18: qty 1

## 2012-12-18 MED ORDER — ENOXAPARIN SODIUM 80 MG/0.8ML ~~LOC~~ SOLN
75.0000 mg | SUBCUTANEOUS | Status: DC
  Administered 2012-12-19: 75 mg via SUBCUTANEOUS
  Filled 2012-12-18: qty 0.8

## 2012-12-18 NOTE — Progress Notes (Addendum)
Transferred to department 300 report called and given to [facility]. Vital signs stable prior to transfer. Attempted to notify Mrs Eichholz of transfer, unsuccessful at this time.Will try again later.

## 2012-12-18 NOTE — Progress Notes (Signed)
[facility] PROGRESS NOTE  [name] [name] AVW:[id number] DOB: 03-18-1966 DOA: 12/16/2012 PCP: Kirk Ruths, MD  Assessment/Plan:  Pyelonephritis   Lower back pain, elevated wbc and fever. White blood cell count is decreasing and fever has resolved. Clinically improving.  Associated back pain could be from lumbar DJD and spinal stenosis seen on CT scan.  U/A positive for infection, culture surprisingly negative.  CT Abd/Pelvis showed 5-mm nonobstructive left nephrolithiasis.  Continue IVF  Empiric Rocephin started 7/15 (low threshold for broadening antibiotic therapy in the setting of history of splenectomy.) We'll continue IV antibiotics for another 24 hours and will likely discharge the patient to home tomorrow.  Strict Is and Os.  (ordered)  Foley catheter has been discontinued. He is urinating freely.  New incomplete RBBB noted on last of serial EKGs  Troponin wnl  May be a transient finding  Will check another EKG    2D echo ordered and pending ( history of OSA and obesity)  Adrenal Insufficiency  Cared for by Dr. Talmage Nap in [place]  On Hydrocortef 20 mg bid - increased to 40 mg twice a day for the first 24 hours, but now back to home dose.  Thyroid disorder/hypothyroidism  Patient reports this has caused him to gain 100 lbs in the past year.  Continue synthroid. TSH pending.  Severe Hepatic Steatosis  Noted on CT.  Discussed with patient.  HTN  Controlled  Metoprolol bid  Hyperglycemia Query steroid-induced. Hemoglobin A1c 5.8.  Leukocytosis Likely multifactorial including steroids, infections, and history of splenectomy. His white blood cell count is decreasing.   DVT Prophylaxis:  Lovenox  Code Status: full Family Communication:  Disposition Plan: inpatient.  To home when appropriate   Consultants:    Procedures:  2 D echo pending  Antibiotics:  Rocephin  HPI/Subjective: Complains of a global headache, but morphine is  helping.  Objective: Filed Vitals:   12/16/12 2225 12/17/12 0217 12/17/12 0408 12/17/12 0410  BP:  115/66 132/53   Pulse:  69 63   Temp:  98.1 F (36.7 C) 97.9 F (36.6 C)   TempSrc:  Oral Oral   Resp:  24 24   Height:      Weight:    151.95 kg (334 lb 15.8 oz)  SpO2: 95% 91% 94%     Intake/Output Summary (Last 24 hours) at 12/17/12 1045 Last data filed at 12/17/12 0409  Gross per 24 hour  Intake    620 ml  Output    500 ml  Net    120 ml   Filed Weights   12/16/12 1736 12/16/12 2145 12/17/12 0410  Weight: 148.78 kg (328 lb) 151.955 kg (335 lb) 151.95 kg (334 lb 15.8 oz)    Exam:   General:  A&O, NAD, Lying comfortably in bed, CPAP in place  Cardiovascular: RRR, no murmurs, rubs or gallops, no lower extremity edema  Respiratory: CTA, no wheeze, crackles, or rales.  No increased work of breathing.  Abdomen: obese, no palpable organomegaly. Soft, non-tender, non-distended, + bowel sounds  Musculoskeletal: Able to move all 4 extremities, 5/5 strength in each  Data Reviewed: Basic Metabolic Panel:  Recent Labs Lab 12/16/12 1815 12/16/12 2244 12/17/12 0502  NA 134*  --  139  K 3.2*  --  3.7  CL 99  --  106  CO2 25  --  23  GLUCOSE 126*  --  178*  BUN 13  --  15  CREATININE 1.00  --  0.99  CALCIUM 9.6  --  8.7  MG  --  1.9  --    Liver Function Tests:  Recent Labs Lab 12/16/12 2244  AST 19  ALT 24  ALKPHOS 64  BILITOT 0.5  PROT 7.7  ALBUMIN 3.9   CBC:  Recent Labs Lab 12/16/12 1815 12/17/12 0502  WBC 25.8* 22.3*  NEUTROABS 22.0*  --   HGB 14.6 12.9*  HCT 43.0 38.4*  MCV 84.1 85.0  PLT 391 346   Cardiac Enzymes:  Recent Labs Lab 12/17/12 0502  TROPONINI <0.30     Studies: Ct Abdomen Pelvis Wo Contrast  12/17/2012   *RADIOLOGY REPORT*  Clinical Data: Fever, frequent urination, history of kidney stone.  CT ABDOMEN AND PELVIS WITHOUT CONTRAST  Technique:  Multidetector CT imaging of the abdomen and pelvis was performed following  the standard protocol without intravenous contrast.  Comparison: 02/18/2011.  Findings:  BODY WALL: Unremarkable.  LOWER CHEST:  Mediastinum: Unremarkable.  Lungs/pleura: No consolidation.  ABDOMEN/PELVIS:  Liver: Diffuse low attenuation of the liver.  No focal abnormality.  Biliary: Cholecystectomy.  No biliary dilatation.  Pancreas: Unremarkable.  Spleen: Splenectomy.  Adrenals: Unremarkable.  Kidneys and ureters: 5 mm stone in the interpolar left kidney.  No hydronephrosis.  17 mm low attenuation lesion in the left anterior hilar lip is consistent with cyst.  Bladder: Decompressed around a Foley catheter.  Bowel: No obstruction. Normal appendix.  Retroperitoneum: Chronic enlargement of the external chain lymph nodes bilaterally.  Peritoneum: No free fluid or gas.  Reproductive: Nonspecific prostate calcifications.  Vascular: Mild aortic and iliac atherosclerotic calcifications.  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  IMPRESSION:  1.  No hydronephrosis or ureteral calculus.  2.  5-mm nonobstructive left nephrolithiasis.  3.  Severe hepatic steatosis.   Original Report Authenticated By: Tiburcio Pea    Scheduled Meds: . aspirin EC  81 mg Oral Daily  . cefTRIAXone (ROCEPHIN)  IV  1 g Intravenous Q24H  . citalopram  30 mg Oral Daily  . docusate sodium  100 mg Oral BID  . enoxaparin (LOVENOX) injection  40 mg Subcutaneous Q24H  . hydrocortisone  40 mg Oral BID  . ketorolac  30 mg Intravenous Q6H  . levothyroxine  75 mcg Oral QAC breakfast  . metoprolol  25 mg Oral BID  . MUSCLE RUB   Topical BID  . pantoprazole  40 mg Oral Daily  . simvastatin  20 mg Oral QHS  . sodium chloride  3 mL Intravenous Q12H  . tamsulosin  0.4 mg Oral Daily   Continuous Infusions: . 0.9 % NaCl with KCl 20 mEq / L 150 mL/hr at 12/17/12 0802    Active Problems:   Acute Pyelonephritis   Nonobstructive renal calculus    Morbid Obesity   OSA on CPAP   Fatty liver   Depression   Hypokalemia   RBBB   Unspecified hypothyroidism   Adrenal insufficiency   Hyperglycemia, presumed to be steroid induced.   Time spent: 20 minutes.   Elliot Cousin, M.D. [phone])  [facility] Pager [phone]. If 7PM-7AM, please contact night-coverage at [email or web address], password [facility] 12/17/2012, 10:45 AM  LOS: 1 day

## 2012-12-19 ENCOUNTER — Encounter (HOSPITAL_COMMUNITY): Payer: Self-pay | Admitting: Internal Medicine

## 2012-12-19 DIAGNOSIS — N200 Calculus of kidney: Secondary | ICD-10-CM | POA: Diagnosis present

## 2012-12-19 HISTORY — DX: Calculus of kidney: N20.0

## 2012-12-19 LAB — CBC
MCHC: 33.4 g/dL (ref 30.0–36.0)
Platelets: 386 10*3/uL (ref 150–400)
RDW: 15.3 % (ref 11.5–15.5)

## 2012-12-19 LAB — BASIC METABOLIC PANEL
BUN: 10 mg/dL (ref 6–23)
GFR calc Af Amer: >90 mL/min (ref >90)
GFR calc non Af Amer: >90 mL/min (ref >90)
Potassium: 4.1 mEq/L (ref 3.5–5.1)
Sodium: 136 mEq/L (ref 135–145)

## 2012-12-19 MED ORDER — OXYCODONE-ACETAMINOPHEN 5-325 MG PO TABS: 1.0000 | ORAL_TABLET | ORAL | Status: DC | PRN

## 2012-12-19 MED ORDER — CEFUROXIME AXETIL 500 MG PO TABS: 500.0000 mg | ORAL_TABLET | Freq: Two times a day (BID) | ORAL | Status: DC

## 2012-12-19 NOTE — Progress Notes (Signed)
Pt discharged home today per Dr. Sherrie Mustache. Pt's IV site D/C'd and WNL. Pt's VS stable at this time. Pt provided with home medication list, prescriptions, discharge instructions. Verbalized understanding. Pt left floor via WC in stable condition accompanied by NT.

## 2012-12-19 NOTE — Discharge Summary (Signed)
Physician Discharge Summary  MATSON WELCH ZOX:[id number] DOB: Jan 10, 1966 DOA: 12/16/2012  PCP: Kirk Ruths, MD  Admit date: 12/16/2012 Discharge date: 12/19/2012  Time spent: Greater than 30 minutes  Recommendations for Outpatient Follow-up:   Discharge Diagnoses:   1. Acute pyelonephritis. 2. Nonobstructive 5 mm left renal stone. 2. Hepatic steatosis/fatty liver. 3. Low back pain, in part, secondary to spinal stenosis and possibly acute pyelonephritis. 4. Chronic adrenal insufficiency, on chronic steroids. 5. Obstructive sleep apnea, on CPAP. He was continued on CPAP during the hospitalization. 6. Morbid obesity. 7. Mild hyperglycemia, steroid induced. Hemoglobin A1c 5.8. 8. Right bundle branch block on EKG. 2-D echocardiogram revealed preserved LV function and no left ventricular regional wall motion abnormalities. 9. Hypothyroidism. 10. Hypokalemia. 11. History of splenectomy.  Discharge Condition: Improved.  Diet recommendation: Heart healthy.  Filed Weights   12/16/12 2145 12/17/12 0410 12/18/12 0559  Weight: 151.955 kg (335 lb) 151.95 kg (334 lb 15.8 oz) 154.677 kg (341 lb)    History of present illness:  The patient is a 47 year old man with a history significant for hypothyroidism, obstructive sleep apnea, adrenal insufficiency, and GERD, who presented to the emergency department on 12/16/2012 with a chief complaint of urinary or frequency and worsening low back pain. He took a dose of Cipro at home when his symptoms became worse. In the emergency department, he was febrile with a temperature 101.7. Otherwise, he was hemodynamically stable. His lab data were significant for a serum potassium of 3.2, white blood cell count of 25.8, and a urinalysis that revealed positive nitrites, small leukocytes, 21-50 WBCs, and 3-6 RBCs. CT scan of his abdomen and pelvis revealed a 5 mm nonobstructive left nephrolithiasis, severe hepatic steatosis, and multifactorial spinal stenosis in  the lumbar region. He was admitted for further evaluation and management.  Hospital Course:   1. Acute pyelonephritis. The patient was started on IV fluids for hydration. Rocephin was given in the emergency department and continued throughout the hospitalization. A urine culture was ordered. Surprisingly, the urine culture was negative, but this could be explained by the patient taking Cipro before he was hospitalized. His blood cultures remained negative to date. He became asymptomatic. His white blood cell count completely normalized. He became afebrile. He received four IV doses of  Rocephin. He was discharged on 7 more days of Ceftin.  2. Nephrolithiasis. There was no evidence of obstruction. He was treated with analgesics and IV fluid hydration.  3. Hypokalemia. He was repleted orally and in the IV fluids. His serum potassium normalized.  4. New incomplete right bundle branch block. For further evaluation, 2-D echocardiogram was ordered. The echo revealed an ejection fraction of 60% and no regional wall motion abnormalities. His troponin I was normal. He was maintained on metoprolol for treatment of hypertension which was well controlled.  5. Chronic adrenal insufficiency, chronic steroids. He was continued on Solu-Cortef, but the dose was increased per 24 hours for stress reasons. It was decreased to his home dose of 20 mg twice a day. There was no evidence of acute decompensation.  6. Hypothyroidism. He is on chronic replacement therapy. His TSH was within normal limits at 3.3.  7. Hyperglycemia. This was thought to be secondary to steroids. His hemoglobin A1c was 5.8.  8. Acute on chronic low back pain. The patient has a history of low back pain. CT of his abdomen and pelvis revealed multifactorial lumbar stenosis. His back pain was treated with opiate analgesics.    Procedures:  2-D echocardiogram:Study Conclusions  -  Left ventricle: The cavity size was normal. There was  mild concentric hypertrophy. Systolic function was normal. The estimated ejection fraction was in the range of 55% to 60%. Wall motion was normal; there were no regional wall motion abnormalities. - Aortic valve: Mildly calcified annulus. Trileaflet; normal thickness leaflets. - Mitral valve: Calcified annulus. Transthoracic echocardiography. M-mode, complete 2D, spectral Doppler, and color Doppler. Height: Height: 170.2cm. Height: 67in. Weight: Weight: 151.5kg. Weight: 333.3lb. Body mass index: BMI: 52.3kg/m^2. Body surface area: BSA: 2.69m^2. Patient status: Inpatient.    Consultations:  None  Discharge Exam: Filed Vitals:   12/18/12 1751 12/18/12 2100 12/19/12 0631 12/19/12 1400  BP: 138/75 138/69 131/78 140/75  Pulse: 74 74 61 61  Temp: 97.7 F (36.5 C) 97.8 F (36.6 C) 97.8 F (36.6 C) 98.1 F (36.7 C)  TempSrc: Oral Oral Oral Oral  Resp: 22 20 15 18   Height:      Weight:      SpO2: 95% 97%  98%    General: Alert obese 47 year old Caucasian man laying in bed, in no acute distress. Cardiovascular: S1, S2, with no murmurs rubs gallops. Respiratory: Clear to auscultation bilaterally.  Discharge Instructions  Discharge Orders   Future Orders Complete By Expires     Diet general  As directed     Increase activity slowly  As directed         Medication List         aspirin EC 81 MG tablet  Take 81 mg by mouth daily.     cefUROXime 500 MG tablet  Commonly known as:  CEFTIN  Take 1 tablet (500 mg total) by mouth 2 (two) times daily. Antibiotic to take for 7 more days.     citalopram 10 MG tablet  Commonly known as:  CELEXA  Take 30 mg by mouth daily.     hydrocortisone 20 MG tablet  Commonly known as:  CORTEF  Take 0.5 tablets (10 mg total) by mouth daily. For cortisol replacement     levothyroxine 75 MCG tablet  Commonly known as:  SYNTHROID, LEVOTHROID  Take 75 mcg by mouth daily before breakfast.     MENS MULTIVITAMIN PLUS Tabs  Take 1 tablet  by mouth daily. /For nutritional supplementation.     methocarbamol 750 MG tablet  Commonly known as:  ROBAXIN  Take 750 mg by mouth every 6 (six) hours as needed. Muscle spasm     metoprolol 50 MG tablet  Commonly known as:  LOPRESSOR  Take 25 mg by mouth 2 (two) times daily.     omeprazole 40 MG capsule  Commonly known as:  PRILOSEC  Take 40 mg by mouth daily.     oxyCODONE-acetaminophen 5-325 MG per tablet  Commonly known as:  PERCOCET/ROXICET  Take 1-2 tablets by mouth every 4 (four) hours as needed for pain.     simvastatin 20 MG tablet  Commonly known as:  ZOCOR  Take 1 tablet (20 mg total) by mouth at bedtime. To help lower cholesterol.     tamsulosin 0.4 MG Caps  Commonly known as:  FLOMAX  Take 1 capsule (0.4 mg total) by mouth daily. For breathing       Allergies  Allergen Reactions  . Hydromorphone Hcl Other (See Comments)    Feels like chest will bust open        Follow-up Information   Follow up with Kirk Ruths, MD. (Patient has a follow up appointment with Dwyane Luo, on Friday, January 02, 2013 at  1:15 p.m.)    Contact information:   [address] [place] [address] [phone]        The results of significant diagnostics from this hospitalization (including imaging, microbiology, ancillary and laboratory) are listed below for reference.    Significant Diagnostic Studies: Ct Abdomen Pelvis Wo Contrast  12/17/2012   *RADIOLOGY REPORT*  Clinical Data: Fever, frequent urination, history of kidney stone.  CT ABDOMEN AND PELVIS WITHOUT CONTRAST  Technique:  Multidetector CT imaging of the abdomen and pelvis was performed following the standard protocol without intravenous contrast.  Comparison: 02/18/2011.  Findings:  BODY WALL: Unremarkable.  LOWER CHEST:  Mediastinum: Unremarkable.  Lungs/pleura: No consolidation.  ABDOMEN/PELVIS:  Liver: Diffuse low attenuation of the liver.  No focal abnormality.  Biliary:  Cholecystectomy.  No biliary dilatation.  Pancreas: Unremarkable.  Spleen: Splenectomy.  Adrenals: Unremarkable.  Kidneys and ureters: 5 mm stone in the interpolar left kidney.  No hydronephrosis.  17 mm low attenuation lesion in the left anterior hilar lip is consistent with cyst.  Bladder: Decompressed around a Foley catheter.  Bowel: No obstruction. Normal appendix.  Retroperitoneum: Chronic enlargement of the external chain lymph nodes bilaterally.  Peritoneum: No free fluid or gas.  Reproductive: Nonspecific prostate calcifications.  Vascular: Mild aortic and iliac atherosclerotic calcifications.  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  IMPRESSION:  1.  No hydronephrosis or ureteral calculus.  2.  5-mm nonobstructive left nephrolithiasis.  3.  Severe hepatic steatosis.   Original Report Authenticated By: Tiburcio Pea    Microbiology: Recent Results (from the past 240 hour(s))  URINE CULTURE     Status: None   Collection Time    12/16/12  6:20 PM      Result Value Range Status   Specimen Description URINE, CLEAN CATCH   Final   Special Requests NONE   Final   Culture  Setup Time 12/16/2012 18:59   Final   Colony Count NO GROWTH   Final   Culture NO GROWTH   Final   Report Status 12/18/2012 FINAL   Final     Labs: Basic Metabolic Panel:  Recent Labs Lab 12/16/12 1815 12/16/12 2244 12/17/12 0502 12/18/12 0509 12/19/12 0617  NA 134*  --  139 137 136  K 3.2*  --  3.7 4.3 4.1  CL 99  --  106 105 102  CO2 25  --  23 24 27   GLUCOSE 126*  --  178* 171* 121*  BUN 13  --  15 9 10   CREATININE 1.00  --  0.99 0.78 0.88  CALCIUM 9.6  --  8.7 8.6 9.0  MG  --  1.9  --   --   --    Liver Function Tests:  Recent Labs Lab 12/16/12 2244  AST 19  ALT 24  ALKPHOS 64  BILITOT 0.5  PROT 7.7  ALBUMIN 3.9   No results found for this basename: LIPASE, AMYLASE,  in the last 168 hours No results  found for this basename: AMMONIA,  in the last 168 hours CBC:  Recent Labs Lab 12/16/12 1815 12/17/12 0502 12/18/12 0509 12/19/12 0617  WBC 25.8* 22.3* 17.2* 9.9  NEUTROABS 22.0*  --  14.5*  --   HGB 14.6 12.9* 12.8* 13.9  HCT 43.0 38.4* 38.6* 41.6  MCV 84.1 85.0 85.2 85.1  PLT 391 346 360 386   Cardiac Enzymes:  Recent Labs Lab  12/17/12 0502  TROPONINI <0.30   BNP: BNP (last 3 results) No results found for this basename: PROBNP,  in the last 8760 hours CBG: No results found for this basename: GLUCAP,  in the last 168 hours     Signed:  Haddy Mullinax  [facility] 12/19/2012, 6:36 PM

## 2012-12-22 NOTE — Progress Notes (Signed)
UR chart review completed.  

## 2013-03-04 ENCOUNTER — Other Ambulatory Visit: Payer: Self-pay | Admitting: *Deleted

## 2013-03-04 MED ORDER — METOPROLOL TARTRATE 50 MG PO TABS: 25.0000 mg | ORAL_TABLET | Freq: Two times a day (BID) | ORAL | Status: DC

## 2013-04-01 ENCOUNTER — Other Ambulatory Visit: Payer: Self-pay | Admitting: Cardiovascular Disease

## 2013-04-02 NOTE — Telephone Encounter (Signed)
Rx was sent to pharmacy electronically. 

## 2013-09-16 ENCOUNTER — Telehealth: Payer: Self-pay | Admitting: Internal Medicine

## 2013-09-16 NOTE — Telephone Encounter (Signed)
Patient received a letter stating its time for repeat TCS, He has missed a lot of work due to sickness and he is asking can he be triaged for TCS or does he need an OV, please advise?

## 2013-09-21 NOTE — Telephone Encounter (Signed)
OV scheduled 09/29/13 and patient is aware

## 2013-09-21 NOTE — Telephone Encounter (Addendum)
pts last tcs was 08/10/2008 by [facility]-    IMPRESSION: Prominent external anal canal hemorrhoids, friable.  Diminutive rectal polyp status post cold biopsy. Remainder of the  rectal mucosa appeared normal, normal colon.   bx shows a hyperplastic polyp. Does he need an ov or can he be triaged?

## 2013-09-21 NOTE — Telephone Encounter (Signed)
Please schedule ov.  

## 2013-09-21 NOTE — Telephone Encounter (Signed)
Pt also had a tcs in 2000, he had a tubular adenoma. +FHCRC- father age 47

## 2013-09-21 NOTE — Telephone Encounter (Signed)
With his particular medical problems, he needs an office visit

## 2013-09-29 ENCOUNTER — Encounter (INDEPENDENT_AMBULATORY_CARE_PROVIDER_SITE_OTHER): Payer: Self-pay

## 2013-09-29 ENCOUNTER — Ambulatory Visit (INDEPENDENT_AMBULATORY_CARE_PROVIDER_SITE_OTHER): Payer: BC Managed Care – PPO | Admitting: Gastroenterology

## 2013-09-29 ENCOUNTER — Encounter: Payer: Self-pay | Admitting: Gastroenterology

## 2013-09-29 VITALS — BP 135/81 | HR 81 | Temp 98.4°F | Ht 67.0 in | Wt 323.4 lb

## 2013-09-29 DIAGNOSIS — K760 Fatty (change of) liver, not elsewhere classified: Secondary | ICD-10-CM

## 2013-09-29 DIAGNOSIS — K7689 Other specified diseases of liver: Secondary | ICD-10-CM

## 2013-09-29 DIAGNOSIS — D369 Benign neoplasm, unspecified site: Secondary | ICD-10-CM

## 2013-09-29 NOTE — Progress Notes (Signed)
Referring Provider: Elsie Lincoln, MD Primary Care Physician:  Leonides Grills, MD Primary GI: Dr. Gala Romney   Chief Complaint  Patient presents with  . Colonoscopy    HPI:   [name] [name] presents today for a visit prior to surveillance colonoscopy. Notes several weeks ago burning in epigastric region, superficial, worsened with shower. No rash. Now resolved. Bristol scale #4, 5. Has BM every morning. Some days easier than others. Lately has felt more constipated. History of hemorrhoids. Mild hematochezia. Sometimes will have postprandial urgency. Will sometimes skip meal to avoid postprandial BM. No diarrhea. Feels like it has been happening more since cholecystectomy (2006).   Last colonoscopy in 2010 with hyperplastic polyp. Hx of tubular adenoma. +FH of colon cancer in father in his early 76s; father succumbed to the disease.   Past Medical History  Diagnosis Date  . Sleep apnea     cpap  . GERD (gastroesophageal reflux disease)     erosive reflux esophagitis, small HH  . Thyroid disease   . Back pain, chronic 02/18/11    BULGING DISK  . IBS (irritable bowel syndrome)   . Hemorrhoids   . S/P colonoscopy 08/2008    int/ext hemorrhoids. friable, rectal polyp  . Tubular adenoma of colon 2000  . Hyperlipemia   . S/P endoscopy 01/10/09    Dr Vivi Ferns, small HH  . Low serum cortisol level     Dr Suzette Battiest  . UTI (lower urinary tract infection)   . Lower extremity edema   . Plantar fasciitis   . Spinal stenosis of lumbar region 12/18/2012  . Nephrolithiasis 12/19/2012    Past Surgical History  Procedure Laterality Date  . Splenectomy  2003    itp  . Cholecystectomy  2006  . Knee surgery      left  . Distal biceps tendon repair  02/20/2012    Procedure: DISTAL BICEPS TENDON REPAIR;  Surgeon: Lorn Junes, MD;  Location: [facility];  Service: Orthopedics;  Laterality: Left;  left elbow distal biceps tendon repair  . Colonoscopy  2000    Dr. Gala Romney-  tubular adenoma  . Colonoscopy  08/10/2008    Dr. Gala Romney-  IMPRESSION:  Prominent external anal canal hemorrhoids, hyperplastic polyp  . Esophagogastroduodenoscopy  2010    Dr. Gala Romney: normal    Current Outpatient Prescriptions  Medication Sig Dispense Refill  . amitriptyline (ELAVIL) 25 MG tablet Take 25 mg by mouth at bedtime.      . DULoxetine (CYMBALTA) 60 MG capsule Take 60 mg by mouth daily.      . hydrocortisone (CORTEF) 20 MG tablet Take 0.5 tablets (10 mg total) by mouth daily. For cortisol replacement  15 tablet  0  . levothyroxine (SYNTHROID, LEVOTHROID) 75 MCG tablet Take 100 mcg by mouth daily before breakfast.       . lisinopril (PRINIVIL,ZESTRIL) 20 MG tablet Take 20 mg by mouth daily.       . methocarbamol (ROBAXIN) 750 MG tablet Take 750 mg by mouth every 6 (six) hours as needed. Muscle spasm      . Multiple Vitamins-Minerals (MENS MULTIVITAMIN PLUS) TABS Take 1 tablet by mouth daily. /For nutritional supplementation.  30 tablet  0  . omeprazole (PRILOSEC) 40 MG capsule Take 40 mg by mouth daily.      . simvastatin (ZOCOR) 20 MG tablet Take 1 tablet (20 mg total) by mouth at bedtime. To help lower cholesterol.  30 tablet  0  . Tamsulosin HCl (  FLOMAX) 0.4 MG CAPS Take 1 capsule (0.4 mg total) by mouth daily. For breathing  30 capsule  0   No current facility-administered medications for this visit.    Allergies as of 09/29/2013 - Review Complete 09/29/2013  Allergen Reaction Noted  . Hydromorphone hcl Other (See Comments)     Family History  Problem Relation Age of Onset  . Colon cancer Father 25    deceased 6 months after diagnosis  . Diabetes Brother     History   Social History  . Marital Status: Married    Spouse Name: N/A    Number of Children: 1  . Years of Education: N/A   Occupational History  . truck driver-local     Social History Main Topics  . Smoking status: Former Smoker -- 1.00 packs/day for 30 years    Types: Cigarettes    Quit date:  03/05/2007  . Smokeless tobacco: None  . Alcohol Use: No     Comment: hx heavy etoh, quit 4 y ago  . Drug Use: No     Comment: quit 4ys ago, chronic marijuana  . Sexual Activity: Yes   Other Topics Concern  . None   Social History Narrative   1 healthy son    Review of Systems: As mentioned in HPI.   Physical Exam: BP 135/81  Pulse 81  Temp(Src) 98.4 F (36.9 C) (Oral)  Ht 5\' 7"  (1.702 m)  Wt 323 lb 6.4 oz (146.693 kg)  BMI 50.64 kg/m2 General:   Alert and oriented. No distress noted. Pleasant and cooperative.  Head:  Normocephalic and atraumatic. Eyes:  Conjuctiva clear without scleral icterus. Mouth:  Oral mucosa pink and moist. Good dentition. No lesions. Heart:  S1, S2 present without murmurs, rubs, or gallops. Regular rate and rhythm. Abdomen:  +BS, soft, non-tender and non-distended. No rebound or guarding. Largely obese. Difficult to appreciate HSM.  Msk:  Symmetrical without gross deformities. Normal posture. Extremities:  Without edema. Neurologic:  Alert and  oriented x4;  grossly normal neurologically. Skin:  Intact without significant lesions or rashes. Psych:  Alert and cooperative. Normal mood and affect.

## 2013-09-29 NOTE — Assessment & Plan Note (Addendum)
48 year old male with need for surveillance colonoscopy, also notable +FH of colon cancer in father in early 46s. Succumbed to disease several months after. Occasional low-volume hematochezia likely benign in nature but needs further evaluation.   Proceed with TCS with Dr. Gala Romney in near future: the risks, benefits, and alternatives have been discussed with the patient in detail. The patient states understanding and desires to proceed. Phenergan 25 mg IV on call due to polypharmacy

## 2013-09-29 NOTE — Patient Instructions (Signed)
We have scheduled you for a colonoscopy with Dr. Rourk.  Further recommendations to follow!   

## 2013-09-30 ENCOUNTER — Telehealth: Payer: Self-pay | Admitting: Gastroenterology

## 2013-09-30 ENCOUNTER — Encounter (HOSPITAL_COMMUNITY): Payer: Self-pay | Admitting: Pharmacy Technician

## 2013-09-30 DIAGNOSIS — K760 Fatty (change of) liver, not elsewhere classified: Secondary | ICD-10-CM

## 2013-09-30 NOTE — Telephone Encounter (Signed)
Outside labs from PCP were last year. Need updated HFP from patient.

## 2013-09-30 NOTE — Assessment & Plan Note (Signed)
Last HFP about a year ago. Needs updated LFTs.

## 2013-10-02 ENCOUNTER — Other Ambulatory Visit: Payer: Self-pay

## 2013-10-02 DIAGNOSIS — K760 Fatty (change of) liver, not elsewhere classified: Secondary | ICD-10-CM

## 2013-10-02 NOTE — Telephone Encounter (Signed)
Mailed letter and lab order to pt. 

## 2013-10-06 NOTE — Progress Notes (Signed)
cc'd to pcp 

## 2013-10-08 ENCOUNTER — Encounter (HOSPITAL_COMMUNITY)
Admission: RE | Disposition: A | Discharge status: Home / Self Care | Payer: Self-pay | Source: Ambulatory Visit | Attending: Internal Medicine

## 2013-10-08 ENCOUNTER — Encounter (HOSPITAL_COMMUNITY): Payer: Self-pay | Admitting: *Deleted

## 2013-10-08 ENCOUNTER — Ambulatory Visit (HOSPITAL_COMMUNITY)
Admission: RE | Admit: 2013-10-08 | Discharge: 2013-10-08 | Disposition: A | Discharge status: Home / Self Care | Payer: BC Managed Care – PPO | Source: Ambulatory Visit | Attending: Internal Medicine | Admitting: Internal Medicine

## 2013-10-08 DIAGNOSIS — K589 Irritable bowel syndrome without diarrhea: Secondary | ICD-10-CM | POA: Insufficient documentation

## 2013-10-08 DIAGNOSIS — K219 Gastro-esophageal reflux disease without esophagitis: Secondary | ICD-10-CM | POA: Insufficient documentation

## 2013-10-08 DIAGNOSIS — M48061 Spinal stenosis, lumbar region without neurogenic claudication: Secondary | ICD-10-CM | POA: Insufficient documentation

## 2013-10-08 DIAGNOSIS — K7689 Other specified diseases of liver: Secondary | ICD-10-CM | POA: Insufficient documentation

## 2013-10-08 DIAGNOSIS — Z860100 Personal history of colon polyps, unspecified: Secondary | ICD-10-CM | POA: Insufficient documentation

## 2013-10-08 DIAGNOSIS — K649 Unspecified hemorrhoids: Secondary | ICD-10-CM | POA: Insufficient documentation

## 2013-10-08 DIAGNOSIS — K921 Melena: Secondary | ICD-10-CM | POA: Insufficient documentation

## 2013-10-08 DIAGNOSIS — Z800 Family history of malignant neoplasm of digestive organs: Secondary | ICD-10-CM | POA: Insufficient documentation

## 2013-10-08 DIAGNOSIS — Q438 Other specified congenital malformations of intestine: Secondary | ICD-10-CM | POA: Insufficient documentation

## 2013-10-08 DIAGNOSIS — K760 Fatty (change of) liver, not elsewhere classified: Secondary | ICD-10-CM

## 2013-10-08 DIAGNOSIS — E785 Hyperlipidemia, unspecified: Secondary | ICD-10-CM | POA: Insufficient documentation

## 2013-10-08 DIAGNOSIS — G4730 Sleep apnea, unspecified: Secondary | ICD-10-CM | POA: Insufficient documentation

## 2013-10-08 DIAGNOSIS — E079 Disorder of thyroid, unspecified: Secondary | ICD-10-CM | POA: Insufficient documentation

## 2013-10-08 DIAGNOSIS — D369 Benign neoplasm, unspecified site: Secondary | ICD-10-CM

## 2013-10-08 DIAGNOSIS — Z1211 Encounter for screening for malignant neoplasm of colon: Secondary | ICD-10-CM | POA: Insufficient documentation

## 2013-10-08 DIAGNOSIS — D126 Benign neoplasm of colon, unspecified: Secondary | ICD-10-CM | POA: Insufficient documentation

## 2013-10-08 DIAGNOSIS — K449 Diaphragmatic hernia without obstruction or gangrene: Secondary | ICD-10-CM | POA: Insufficient documentation

## 2013-10-08 HISTORY — PX: COLONOSCOPY: SHX5424

## 2013-10-08 SURGERY — COLONOSCOPY
Anesthesia: Moderate Sedation

## 2013-10-08 MED ORDER — MEPERIDINE HCL 100 MG/ML IJ SOLN
INTRAMUSCULAR | Status: DC | PRN
  Administered 2013-10-08: 50 mg via INTRAVENOUS
  Administered 2013-10-08 (×2): 25 mg via INTRAVENOUS

## 2013-10-08 MED ORDER — MIDAZOLAM HCL 5 MG/5ML IJ SOLN
INTRAMUSCULAR | Status: AC
  Filled 2013-10-08: qty 10

## 2013-10-08 MED ORDER — MEPERIDINE HCL 100 MG/ML IJ SOLN
INTRAMUSCULAR | Status: AC
  Filled 2013-10-08: qty 2

## 2013-10-08 MED ORDER — SODIUM CHLORIDE 0.9 % IV SOLN
INTRAVENOUS | Status: DC
  Administered 2013-10-08: 12:00:00 via INTRAVENOUS

## 2013-10-08 MED ORDER — PROMETHAZINE HCL 25 MG/ML IJ SOLN
25.0000 mg | Freq: Once | INTRAMUSCULAR | Status: AC
  Administered 2013-10-08: 25 mg via INTRAVENOUS

## 2013-10-08 MED ORDER — ONDANSETRON HCL 4 MG/2ML IJ SOLN
INTRAMUSCULAR | Status: AC
  Filled 2013-10-08: qty 2

## 2013-10-08 MED ORDER — STERILE WATER FOR IRRIGATION IR SOLN
Status: DC | PRN
  Administered 2013-10-08: 13:00:00

## 2013-10-08 MED ORDER — ONDANSETRON HCL 4 MG/2ML IJ SOLN
INTRAMUSCULAR | Status: DC | PRN
  Administered 2013-10-08: 4 mg via INTRAVENOUS

## 2013-10-08 MED ORDER — PROMETHAZINE HCL 25 MG/ML IJ SOLN
INTRAMUSCULAR | Status: AC
  Filled 2013-10-08: qty 1

## 2013-10-08 MED ORDER — MIDAZOLAM HCL 5 MG/5ML IJ SOLN
INTRAMUSCULAR | Status: DC | PRN
  Administered 2013-10-08: 2 mg via INTRAVENOUS
  Administered 2013-10-08: 1 mg via INTRAVENOUS
  Administered 2013-10-08: 2 mg via INTRAVENOUS

## 2013-10-08 MED ORDER — SODIUM CHLORIDE 0.9 % IJ SOLN
INTRAMUSCULAR | Status: AC
  Filled 2013-10-08: qty 10

## 2013-10-08 NOTE — Interval H&P Note (Signed)
History and Physical Interval Note:  10/08/2013 12:55 PM  [name] [name]  has presented today for surgery, with the diagnosis of surveillance colonoscopy, fatty liver  The various methods of treatment have been discussed with the patient and family. After consideration of risks, benefits and other options for treatment, the patient has consented to  Procedure(s) with comments: COLONOSCOPY (N/A) - 1:45-moved to [place] notified pt as a surgical intervention .  The patient's history has been reviewed, patient examined, no change in status, stable for surgery.  I have reviewed the patient's chart and labs.  Questions were answered to the patient's satisfaction.     Change. Colonoscopy per plan.The risks, benefits, limitations, alternatives and imponderables have been reviewed with the patient. Questions have been answered. All parties are agreeable.   Cristopher Estimable [name] [name]

## 2013-10-08 NOTE — Op Note (Signed)
[facility] [address] [place], [address]   COLONOSCOPY PROCEDURE REPORT  PATIENT: [name] [name], [name] [name]  MR#:         [id number] BIRTHDATE: 02-07-1966 , 47  yrs. old GENDER: Male ENDOSCOPIST: R.  Garfield Cornea, MD FACP FACG REFERRED BY:  Elsie Lincoln, M.D. PROCEDURE DATE:  10/08/2013 PROCEDURE:     Ileocolonoscopy with biopsy and multiple snare polypectomies  INDICATIONS: Hematochezia; positive family history of colon cancer and personal history of colonic polyps  INFORMED CONSENT:  The risks, benefits, alternatives and imponderables including but not limited to bleeding, perforation as well as the possibility of a missed lesion have been reviewed.  The potential for biopsy, lesion removal, etc. have also been discussed.  Questions have been answered.  All parties agreeable. Please see the history and physical in the medical record for more information.  MEDICATIONS: Versed 6 mg IV and Demerol 100 mg. Zofran 4 mg IV and Phenergan 25 mg IV  DESCRIPTION OF PROCEDURE:  After a digital rectal exam was performed, the EC-[id number] ([id number])  colonoscope was advanced from the anus through the rectum and colon to the area of the cecum, ileocecal valve and appendiceal orifice.  The cecum was deeply intubated.  These structures were well-seen and photographed for the record.  From the level of the cecum and ileocecal valve, the scope was slowly and cautiously withdrawn.  The mucosal surfaces were carefully surveyed utilizing scope tip deflection to facilitate fold flattening as needed.  The scope was pulled down into the rectum where a thorough examination including retroflexion was performed.    FINDINGS:  Adequate preparation. Patient had prominent grade 3 hemorrhoids; otherwise normal rectum. Somewhat redundant colon. Patient had multiple colonic polyps: (1) ascending polyp ; (2) at the hepatic flexure and (1) in the mid descending segment; the largest polyp was 6  mm in dimensions.  THERAPEUTIC / DIAGNOSTIC MANEUVERS PERFORMED:  The above-mentioned polyps were either cold biopsy removed or hot snare removed  COMPLICATIONS: None  CECAL WITHDRAWAL TIME:  14 minutes  IMPRESSION:  Multiple colonic polyps -  removed as described above. Prominent grade 3 hemorrhoids -  likely source of hematochezia  RECOMMENDATIONS: Followup on pathology. Patient would likely benefit from hemorrhoid banding. In his particular case, would recommend endoscopy assisted banding under conscious sedation a later date.   _______________________________ eSigned:  R. Garfield Cornea, MD FACP [facility] 10/08/2013 1:41 PM   CC:

## 2013-10-08 NOTE — H&P (View-Only) (Signed)
Referring Provider: Elsie Lincoln, MD Primary Care Physician:  Leonides Grills, MD Primary GI: Dr. Gala Romney   Chief Complaint  Patient presents with  . Colonoscopy    HPI:   [name] [name] presents today for a visit prior to surveillance colonoscopy. Notes several weeks ago burning in epigastric region, superficial, worsened with shower. No rash. Now resolved. Bristol scale #4, 5. Has BM every morning. Some days easier than others. Lately has felt more constipated. History of hemorrhoids. Mild hematochezia. Sometimes will have postprandial urgency. Will sometimes skip meal to avoid postprandial BM. No diarrhea. Feels like it has been happening more since cholecystectomy (2006).   Last colonoscopy in 2010 with hyperplastic polyp. Hx of tubular adenoma. +FH of colon cancer in father in his early 75s; father succumbed to the disease.   Past Medical History  Diagnosis Date  . Sleep apnea     cpap  . GERD (gastroesophageal reflux disease)     erosive reflux esophagitis, small HH  . Thyroid disease   . Back pain, chronic 02/18/11    BULGING DISK  . IBS (irritable bowel syndrome)   . Hemorrhoids   . S/P colonoscopy 08/2008    int/ext hemorrhoids. friable, rectal polyp  . Tubular adenoma of colon 2000  . Hyperlipemia   . S/P endoscopy 01/10/09    Dr Vivi Ferns, small HH  . Low serum cortisol level     Dr Suzette Battiest  . UTI (lower urinary tract infection)   . Lower extremity edema   . Plantar fasciitis   . Spinal stenosis of lumbar region 12/18/2012  . Nephrolithiasis 12/19/2012    Past Surgical History  Procedure Laterality Date  . Splenectomy  2003    itp  . Cholecystectomy  2006  . Knee surgery      left  . Distal biceps tendon repair  02/20/2012    Procedure: DISTAL BICEPS TENDON REPAIR;  Surgeon: Lorn Junes, MD;  Location: [place];  Service: Orthopedics;  Laterality: Left;  left elbow distal biceps tendon repair  . Colonoscopy  2000    Dr. Gala Romney-  tubular adenoma  . Colonoscopy  08/10/2008    Dr. Gala Romney-  IMPRESSION:  Prominent external anal canal hemorrhoids, hyperplastic polyp  . Esophagogastroduodenoscopy  2010    Dr. Gala Romney: normal    Current Outpatient Prescriptions  Medication Sig Dispense Refill  . amitriptyline (ELAVIL) 25 MG tablet Take 25 mg by mouth at bedtime.      . DULoxetine (CYMBALTA) 60 MG capsule Take 60 mg by mouth daily.      . hydrocortisone (CORTEF) 20 MG tablet Take 0.5 tablets (10 mg total) by mouth daily. For cortisol replacement  15 tablet  0  . levothyroxine (SYNTHROID, LEVOTHROID) 75 MCG tablet Take 100 mcg by mouth daily before breakfast.       . lisinopril (PRINIVIL,ZESTRIL) 20 MG tablet Take 20 mg by mouth daily.       . methocarbamol (ROBAXIN) 750 MG tablet Take 750 mg by mouth every 6 (six) hours as needed. Muscle spasm      . Multiple Vitamins-Minerals (MENS MULTIVITAMIN PLUS) TABS Take 1 tablet by mouth daily. /For nutritional supplementation.  30 tablet  0  . omeprazole (PRILOSEC) 40 MG capsule Take 40 mg by mouth daily.      . simvastatin (ZOCOR) 20 MG tablet Take 1 tablet (20 mg total) by mouth at bedtime. To help lower cholesterol.  30 tablet  0  . Tamsulosin HCl (  FLOMAX) 0.4 MG CAPS Take 1 capsule (0.4 mg total) by mouth daily. For breathing  30 capsule  0   No current facility-administered medications for this visit.    Allergies as of 09/29/2013 - Review Complete 09/29/2013  Allergen Reaction Noted  . Hydromorphone hcl Other (See Comments)     Family History  Problem Relation Age of Onset  . Colon cancer Father 25    deceased 6 months after diagnosis  . Diabetes Brother     History   Social History  . Marital Status: Married    Spouse Name: N/A    Number of Children: 1  . Years of Education: N/A   Occupational History  . truck driver-local     Social History Main Topics  . Smoking status: Former Smoker -- 1.00 packs/day for 30 years    Types: Cigarettes    Quit date:  03/05/2007  . Smokeless tobacco: None  . Alcohol Use: No     Comment: hx heavy etoh, quit 4 y ago  . Drug Use: No     Comment: quit 4ys ago, chronic marijuana  . Sexual Activity: Yes   Other Topics Concern  . None   Social History Narrative   1 healthy son    Review of Systems: As mentioned in HPI.   Physical Exam: BP 135/81  Pulse 81  Temp(Src) 98.4 F (36.9 C) (Oral)  Ht 5\' 7"  (1.702 m)  Wt 323 lb 6.4 oz (146.693 kg)  BMI 50.64 kg/m2 General:   Alert and oriented. No distress noted. Pleasant and cooperative.  Head:  Normocephalic and atraumatic. Eyes:  Conjuctiva clear without scleral icterus. Mouth:  Oral mucosa pink and moist. Good dentition. No lesions. Heart:  S1, S2 present without murmurs, rubs, or gallops. Regular rate and rhythm. Abdomen:  +BS, soft, non-tender and non-distended. No rebound or guarding. Largely obese. Difficult to appreciate HSM.  Msk:  Symmetrical without gross deformities. Normal posture. Extremities:  Without edema. Neurologic:  Alert and  oriented x4;  grossly normal neurologically. Skin:  Intact without significant lesions or rashes. Psych:  Alert and cooperative. Normal mood and affect.

## 2013-10-08 NOTE — Discharge Instructions (Addendum)
Colonoscopy Discharge Instructions  Read the instructions outlined below and refer to this sheet in the next few weeks. These discharge instructions provide you with general information on caring for yourself after you leave the hospital. Your doctor may also give you specific instructions. While your treatment has been planned according to the most current medical practices available, unavoidable complications occasionally occur. If you have any problems or questions after discharge, call Dr. Gala Romney  Hemorrhoids Hemorrhoids are swollen veins around the rectum or anus. There are two types of hemorrhoids:   Internal hemorrhoids. These occur in the veins just inside the rectum. They may poke through to the outside and become irritated and painful.  External hemorrhoids. These occur in the veins outside the anus and can be felt as a painful swelling or hard lump near the anus. CAUSES  Pregnancy.   Obesity.   Constipation or diarrhea.   Straining to have a bowel movement.   Sitting for long periods on the toilet.  Heavy lifting or other activity that caused you to strain.  Anal intercourse. SYMPTOMS   Pain.   Anal itching or irritation.   Rectal bleeding.   Fecal leakage.   Anal swelling.   One or more lumps around the anus.  DIAGNOSIS  Your caregiver may be able to diagnose hemorrhoids by visual examination. Other examinations or tests that may be performed include:   Examination of the rectal area with a gloved hand (digital rectal exam).   Examination of anal canal using a small tube (scope).   A blood test if you have lost a significant amount of blood.  A test to look inside the colon (sigmoidoscopy or colonoscopy). TREATMENT Most hemorrhoids can be treated at home. However, if symptoms do not seem to be getting better or if you have a lot of rectal bleeding, your caregiver may perform a procedure to help make the hemorrhoids get smaller or remove them  completely. Possible treatments include:   Placing a rubber band at the base of the hemorrhoid to cut off the circulation (rubber band ligation).   Injecting a chemical to shrink the hemorrhoid (sclerotherapy).   Using a tool to burn the hemorrhoid (infrared light therapy).   Surgically removing the hemorrhoid (hemorrhoidectomy).   Stapling the hemorrhoid to block blood flow to the tissue (hemorrhoid stapling).  HOME CARE INSTRUCTIONS   Eat foods with fiber, such as whole grains, beans, nuts, fruits, and vegetables. Ask your doctor about taking products with added fiber in them (fibersupplements).  Increase fluid intake. Drink enough water and fluids to keep your urine clear or pale yellow.   Exercise regularly.   Go to the bathroom when you have the urge to have a bowel movement. Do not wait.   Avoid straining to have bowel movements.   Keep the anal area dry and clean. Use wet toilet paper or moist towelettes after a bowel movement.   Medicated creams and suppositories may be used or applied as directed.   Only take over-the-counter or prescription medicines as directed by your caregiver.   Take warm sitz baths for 15 20 minutes, 3 4 times a day to ease pain and discomfort.   Place ice packs on the hemorrhoids if they are tender and swollen. Using ice packs between sitz baths may be helpful.   Put ice in a plastic bag.   Place a towel between your skin and the bag.   Leave the ice on for 15 20 minutes, 3 4 times a day.  Do not use a donut-shaped pillow or sit on the toilet for long periods. This increases blood pooling and pain.  SEEK MEDICAL CARE IF:  You have increasing pain and swelling that is not controlled by treatment or medicine.  You have uncontrolled bleeding.  You have difficulty or you are unable to have a bowel movement.  You have pain or inflammation outside the area of the hemorrhoids. MAKE SURE YOU:  Understand these  instructions.  Will watch your condition.  Will get help right away if you are not doing well or get worse.   ACTIVITY  You may resume your regular activity, but move at a slower pace for the next 24 hours.   Take frequent rest periods for the next 24 hours.   Walking will help get rid of the air and reduce the bloated feeling in your belly (abdomen).   No driving for 24 hours (because of the medicine (anesthesia) used during the test).    Do not sign any important legal documents or operate any machinery for 24 hours (because of the anesthesia used during the test).  NUTRITION  Drink plenty of fluids.   You may resume your normal diet as instructed by your doctor.   Begin with a light meal and progress to your normal diet. Heavy or fried foods are harder to digest and may make you feel sick to your stomach (nauseated).   Avoid alcoholic beverages for 24 hours or as instructed.  MEDICATIONS  You may resume your normal medications unless your doctor tells you otherwise.  WHAT YOU CAN EXPECT TODAY  Some feelings of bloating in the abdomen.   Passage of more gas than usual.   Spotting of blood in your stool or on the toilet paper.  IF YOU HAD POLYPS REMOVED DURING THE COLONOSCOPY:  No aspirin products for 7 days or as instructed.   No alcohol for 7 days or as instructed.   Eat a soft diet for the next 24 hours.  FINDING OUT THE RESULTS OF YOUR TEST Not all test results are available during your visit. If your test results are not back during the visit, make an appointment with your caregiver to find out the results. Do not assume everything is normal if you have not heard from your caregiver or the medical facility. It is important for you to follow up on all of your test results.  SEEK IMMEDIATE MEDICAL ATTENTION IF:  You have more than a spotting of blood in your stool.   Your belly is swollen (abdominal distention).   You are nauseated or vomiting.   You have a  temperature over 101.   You have abdominal pain or discomfort that is severe or gets worse throughout the day.  Colon Polyps Polyps are lumps of extra tissue growing inside the body. Polyps can grow in the large intestine (colon). Most colon polyps are noncancerous (benign). However, some colon polyps can become cancerous over time. Polyps that are larger than a pea may be harmful. To be safe, caregivers remove and test all polyps. CAUSES  Polyps form when mutations in the genes cause your cells to grow and divide even though no more tissue is needed. RISK FACTORS There are a number of risk factors that can increase your chances of getting colon polyps. They include: Being older than 50 years. Family history of colon polyps or colon cancer. Long-term colon diseases, such as colitis or Crohn disease. Being overweight. Smoking. Being inactive. Drinking too much alcohol.  SYMPTOMS  Most small polyps do not cause symptoms. If symptoms are present, they may include: Blood in the stool. The stool may look dark red or black. Constipation or diarrhea that lasts longer than 1 week. DIAGNOSIS People often do not know they have polyps until their caregiver finds them during a regular checkup. Your caregiver can use 4 tests to check for polyps: Digital rectal exam. The caregiver wears gloves and feels inside the rectum. This test would find polyps only in the rectum. Barium enema. The caregiver puts a liquid called barium into your rectum before taking X-rays of your colon. Barium makes your colon look white. Polyps are dark, so they are easy to see in the X-ray pictures. Sigmoidoscopy. A thin, flexible tube (sigmoidoscope) is placed into your rectum. The sigmoidoscope has a light and tiny camera in it. The caregiver uses the sigmoidoscope to look at the last third of your colon. Colonoscopy. This test is like sigmoidoscopy, but the caregiver looks at the entire colon. This is the most common method for  finding and removing polyps. TREATMENT  Any polyps will be removed during a sigmoidoscopy or colonoscopy. The polyps are then tested for cancer. PREVENTION  To help lower your risk of getting more colon polyps: Eat plenty of fruits and vegetables. Avoid eating fatty foods. Do not smoke. Avoid drinking alcohol. Exercise every day. Lose weight if recommended by your caregiver. Eat plenty of calcium and folate. Foods that are rich in calcium include milk, cheese, and broccoli. Foods that are rich in folate include chickpeas, kidney beans, and spinach. HOME CARE INSTRUCTIONS Keep all follow-up appointments as directed by your caregiver. You may need periodic exams to check for polyps. SEEK MEDICAL CARE IF: You notice bleeding during a bowel movement.    Hemorrhoid and polyp information provided  Recommendations to follow pending review of pathology report  Will plan to place hemorrhoid bands in the near future.

## 2013-10-10 LAB — HEPATIC FUNCTION PANEL
ALT: 28 U/L (ref 0–53)
AST: 23 U/L (ref 0–37)
Albumin: 4.1 g/dL (ref 3.5–5.2)
Alkaline Phosphatase: 59 U/L (ref 39–117)
BILIRUBIN DIRECT: 0.1 mg/dL (ref 0.0–0.3)
BILIRUBIN INDIRECT: 0.2 mg/dL (ref 0.2–1.2)
Total Bilirubin: 0.3 mg/dL (ref 0.2–1.2)
Total Protein: 6.7 g/dL (ref 6.0–8.3)

## 2013-10-13 ENCOUNTER — Encounter: Payer: Self-pay | Admitting: Internal Medicine

## 2013-10-13 ENCOUNTER — Encounter (HOSPITAL_COMMUNITY): Payer: Self-pay | Admitting: Internal Medicine

## 2013-10-13 NOTE — Progress Notes (Signed)
Quick Note:  LFTs look great.  Repeat in 1 year. ______ 

## 2013-10-15 ENCOUNTER — Telehealth: Payer: Self-pay

## 2013-10-15 NOTE — Telephone Encounter (Signed)
OPV IS June 2ND AT 10:00 AND HE IS AWARE

## 2013-10-15 NOTE — Telephone Encounter (Signed)
Yes just like any other endoscopy related procedure

## 2013-10-15 NOTE — Telephone Encounter (Signed)
Routing to RMR. 

## 2013-10-15 NOTE — Telephone Encounter (Signed)
Pt stated the RMR told his wife that he would need the hemorrhoids surgery removed. I told him that I thought we would have to send him out for that but I would ask. Please advise

## 2013-10-15 NOTE — Telephone Encounter (Signed)
Per RMR procedure note, pt will need hemorrhoid banding done with conscious sedation in endo. Routing to LL

## 2013-10-15 NOTE — Telephone Encounter (Signed)
Would patient need H&P for this please advise?

## 2013-10-19 ENCOUNTER — Other Ambulatory Visit: Payer: Self-pay | Admitting: Gastroenterology

## 2013-10-19 DIAGNOSIS — K760 Fatty (change of) liver, not elsewhere classified: Secondary | ICD-10-CM

## 2013-11-03 ENCOUNTER — Encounter: Payer: Self-pay | Admitting: Gastroenterology

## 2013-11-03 ENCOUNTER — Ambulatory Visit (INDEPENDENT_AMBULATORY_CARE_PROVIDER_SITE_OTHER): Payer: BC Managed Care – PPO | Admitting: Gastroenterology

## 2013-11-03 ENCOUNTER — Encounter (INDEPENDENT_AMBULATORY_CARE_PROVIDER_SITE_OTHER): Payer: Self-pay

## 2013-11-03 ENCOUNTER — Encounter (HOSPITAL_COMMUNITY): Payer: Self-pay | Admitting: Pharmacy Technician

## 2013-11-03 ENCOUNTER — Other Ambulatory Visit: Payer: Self-pay | Admitting: Internal Medicine

## 2013-11-03 VITALS — BP 136/88 | HR 84 | Temp 97.3°F | Resp 20 | Ht 68.0 in | Wt 331.2 lb

## 2013-11-03 DIAGNOSIS — K649 Unspecified hemorrhoids: Secondary | ICD-10-CM

## 2013-11-03 DIAGNOSIS — K648 Other hemorrhoids: Secondary | ICD-10-CM | POA: Insufficient documentation

## 2013-11-03 NOTE — Progress Notes (Signed)
cc'd to pcp 

## 2013-11-03 NOTE — Progress Notes (Signed)
Primary Care Physician:  MCGOUGH,WILLIAM M, MD  Primary Gastroenterologist:  Michael Rourk, MD   Chief Complaint  Patient presents with  . Hemorrhoids    consult on banding w/ sedation    HPI:  [name] [name] is a 48 y.o. male here to discuss hemorrhoid banding. Patient recently had a colonoscopy and was found to have multiple to adenomas which were removed. He had prominent grade 3 hemorrhoids felt because of his hematochezia. Dr. Rourk felt like patient would benefit from hemorrhoid banding, in this case endoscopic-assisted under conscious sedation.   Patient continues to have complaints of rectal itching, burning, bleeding. Has at least 2 bowel movements daily. Some postprandial urgency. Rarely has any straining. In the past he had some constipation but none recently. 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 Denies any current problems with abdominal pain, heartburn, weight loss. Patient tells me he is not interested in pursuing hemorrhoid banding awake. Worried about significant rectal pain. Prefers endoscopic hemorrhoid banding under sedation.  Current Outpatient Prescriptions  Medication Sig Dispense Refill  . amitriptyline (ELAVIL) 25 MG tablet Take 25 mg by mouth at bedtime.      . DULoxetine (CYMBALTA) 60 MG capsule Take 60 mg by mouth daily.      . hydrocortisone (CORTEF) 20 MG tablet Take 0.5 tablets (10 mg total) by mouth daily. For cortisol replacement  15 tablet  0  . levothyroxine (SYNTHROID, LEVOTHROID) 100 MCG tablet Take 100 mcg by mouth daily before breakfast.      . lidocaine (LIDODERM) 5 % Place 1 patch onto the skin daily as needed (Pain). Remove & Discard patch within 12 hours or as directed by MD      . lidocaine (XYLOCAINE) 5 % ointment Apply 1 application topically as needed for mild pain.      . lisinopril (PRINIVIL,ZESTRIL) 20 MG tablet Take 20 mg by mouth daily.       . methocarbamol (ROBAXIN) 750 MG tablet Take 750 mg by mouth  every 6 (six) hours as needed. Muscle spasm      . Multiple Vitamins-Minerals (MENS MULTIVITAMIN PLUS) TABS Take 1 tablet by mouth daily. /For nutritional supplementation.  30 tablet  0  . omeprazole (PRILOSEC) 40 MG capsule Take 40 mg by mouth daily.      . simvastatin (ZOCOR) 20 MG tablet Take 1 tablet (20 mg total) by mouth at bedtime. To help lower cholesterol.  30 tablet  0  . Tamsulosin HCl (FLOMAX) 0.4 MG CAPS Take 1 capsule (0.4 mg total) by mouth daily. For breathing  30 capsule  0   No current facility-administered medications for this visit.    Allergies as of 11/03/2013 - Review Complete 10/08/2013  Allergen Reaction Noted  . Hydromorphone hcl Other (See Comments)     Past Medical History  Diagnosis Date  . Sleep apnea     cpap  . GERD (gastroesophageal reflux disease)     erosive reflux esophagitis, small HH  . Thyroid disease   . Back pain, chronic 02/18/11    BULGING DISK  . IBS (irritable bowel syndrome)   . Hemorrhoids   . S/P colonoscopy 08/2008    int/ext hemorrhoids. friable, rectal polyp  . Tubular adenoma of colon 2000  . Hyperlipemia   . S/P endoscopy 01/10/09    Dr Rourk-normal, small HH  . Low serum cortisol level     Dr Balen  . UTI (lower urinary tract infection)   .   Lower extremity edema   . Plantar fasciitis   . Spinal stenosis of lumbar region 12/18/2012  . Nephrolithiasis 12/19/2012    Past Surgical History  Procedure Laterality Date  . Splenectomy  2003    itp  . Cholecystectomy  2006  . Knee surgery      left  . Distal biceps tendon repair  02/20/2012    Procedure: DISTAL BICEPS TENDON REPAIR;  Surgeon: Robert A Wainer, MD;  Location: [facility];  Service: Orthopedics;  Laterality: Left;  left elbow distal biceps tendon repair  . Colonoscopy  2000    Dr. Rourk- tubular adenoma  . Colonoscopy  08/10/2008    Dr. Rourk-  IMPRESSION:  Prominent external anal canal hemorrhoids, hyperplastic polyp  . Esophagogastroduodenoscopy   2010    Dr. Rourk: normal  . Colonoscopy N/A 10/08/2013    RMR:Multiple colonic polyps -  removed as described above/Prominent grade 3 hemorrhoids -  likely source of hematochezia. tubular adenomas. next TCS 10/2016    Family History  Problem Relation Age of Onset  . Colon cancer Father 52    deceased 6 months after diagnosis  . Diabetes Brother     History   Social History  . Marital Status: Married    Spouse Name: N/A    Number of Children: 1  . Years of Education: N/A   Occupational History  . truck driver-local     Social History Main Topics  . Smoking status: Former Smoker -- 1.00 packs/day for 30 years    Types: Cigarettes    Quit date: 03/05/2007  . Smokeless tobacco: Not on file  . Alcohol Use: No     Comment: hx heavy etoh, quit 4 y ago  . Drug Use: No     Comment: quit 4ys ago, chronic marijuana  . Sexual Activity: Yes   Other Topics Concern  . Not on file   Social History Narrative   1 healthy son      ROS:  General: Negative for anorexia, weight loss, fever, chills, fatigue, weakness. Eyes: Negative for vision changes.  ENT: Negative for hoarseness, difficulty swallowing , nasal congestion. CV: Negative for chest pain, angina, palpitations, dyspnea on exertion, peripheral edema.  Respiratory: Negative for dyspnea at rest, dyspnea on exertion, cough, sputum, wheezing.  GI: See history of present illness. GU:  Negative for dysuria, hematuria, urinary incontinence, urinary frequency, nocturnal urination.  MS: Negative for joint pain, low back pain.  Derm: Negative for rash or itching.  Neuro: Negative for weakness, abnormal sensation, seizure, frequent headaches, memory loss, confusion.  Psych: Negative for anxiety, depression, suicidal ideation, hallucinations.  Endo: Negative for unusual weight change.  Heme: Negative for bruising or bleeding. Allergy: Negative for rash or hives.    Physical Examination:  BP 136/88  Pulse 84  Temp(Src) 97.3 F  (36.3 C) (Oral)  Resp 20  Ht 5' 8" (1.727 m)  Wt 331 lb 3.2 oz (150.231 kg)  BMI 50.37 kg/m2   General: Well-nourished, well-developed in no acute distress.  Head: Normocephalic, atraumatic.   Eyes: Conjunctiva pink, no icterus. Mouth: Oropharyngeal mucosa moist and pink , no lesions erythema or exudate. Neck: Supple without thyromegaly, masses, or lymphadenopathy.  Lungs: Clear to auscultation bilaterally.  Heart: Regular rate and rhythm, no murmurs rubs or gallops.  Abdomen: Bowel sounds are normal, nontender, nondistended, no hepatosplenomegaly or masses, no abdominal bruits or    hernia , no rebound or guarding.   Rectal: Not performed Extremities: No lower extremity   edema. No clubbing or deformities.  Neuro: Alert and oriented x 4 , grossly normal neurologically.  Skin: Warm and dry, no rash or jaundice.   Psych: Alert and cooperative, normal mood and affect.   

## 2013-11-03 NOTE — Assessment & Plan Note (Signed)
48 year old gentleman with complicated/symptomatic hemorrhoids requesting hemorrhoid banding. Per Dr. Roseanne Kaufman previous recommendation and patient's concern we will pursue flexible sigmoidoscopy with endoscopic hemorrhoid banding under sedation. He will receive Phenergan 25 mg IV 30 minutes before the procedure to augment conscious sedation as he did with his previous colonoscopy.  I have discussed the risks, alternatives, benefits with regards to but not limited to the risk of reaction to medication, bleeding, infection, perforation and the patient is agreeable to proceed. Written consent to be obtained.

## 2013-11-03 NOTE — Patient Instructions (Signed)
1. Hemorrhoid banding with sedation. See separate instructions.

## 2013-11-10 ENCOUNTER — Encounter (HOSPITAL_COMMUNITY)
Admission: RE | Disposition: A | Discharge status: Home / Self Care | Payer: Self-pay | Source: Ambulatory Visit | Attending: Internal Medicine

## 2013-11-10 ENCOUNTER — Ambulatory Visit (HOSPITAL_COMMUNITY)
Admission: RE | Admit: 2013-11-10 | Discharge: 2013-11-10 | Disposition: A | Discharge status: Home / Self Care | Payer: BC Managed Care – PPO | Source: Ambulatory Visit | Attending: Internal Medicine | Admitting: Internal Medicine

## 2013-11-10 ENCOUNTER — Encounter (HOSPITAL_COMMUNITY): Payer: Self-pay | Admitting: *Deleted

## 2013-11-10 DIAGNOSIS — G4730 Sleep apnea, unspecified: Secondary | ICD-10-CM | POA: Insufficient documentation

## 2013-11-10 DIAGNOSIS — Z888 Allergy status to other drugs, medicaments and biological substances status: Secondary | ICD-10-CM | POA: Insufficient documentation

## 2013-11-10 DIAGNOSIS — Z79899 Other long term (current) drug therapy: Secondary | ICD-10-CM | POA: Insufficient documentation

## 2013-11-10 DIAGNOSIS — E079 Disorder of thyroid, unspecified: Secondary | ICD-10-CM | POA: Insufficient documentation

## 2013-11-10 DIAGNOSIS — E785 Hyperlipidemia, unspecified: Secondary | ICD-10-CM | POA: Insufficient documentation

## 2013-11-10 DIAGNOSIS — Z860100 Personal history of colon polyps, unspecified: Secondary | ICD-10-CM | POA: Insufficient documentation

## 2013-11-10 DIAGNOSIS — K219 Gastro-esophageal reflux disease without esophagitis: Secondary | ICD-10-CM | POA: Insufficient documentation

## 2013-11-10 DIAGNOSIS — Z87891 Personal history of nicotine dependence: Secondary | ICD-10-CM | POA: Insufficient documentation

## 2013-11-10 DIAGNOSIS — K589 Irritable bowel syndrome without diarrhea: Secondary | ICD-10-CM | POA: Insufficient documentation

## 2013-11-10 DIAGNOSIS — K648 Other hemorrhoids: Secondary | ICD-10-CM | POA: Insufficient documentation

## 2013-11-10 DIAGNOSIS — K649 Unspecified hemorrhoids: Secondary | ICD-10-CM

## 2013-11-10 DIAGNOSIS — K644 Residual hemorrhoidal skin tags: Secondary | ICD-10-CM | POA: Insufficient documentation

## 2013-11-10 HISTORY — DX: Hypothyroidism, unspecified: E03.9

## 2013-11-10 HISTORY — PX: FLEXIBLE SIGMOIDOSCOPY: SHX5431

## 2013-11-10 HISTORY — PX: HEMORRHOID BANDING: SHX5850

## 2013-11-10 SURGERY — SIGMOIDOSCOPY, FLEXIBLE
Anesthesia: Moderate Sedation

## 2013-11-10 MED ORDER — PROMETHAZINE HCL 25 MG/ML IJ SOLN
25.0000 mg | Freq: Once | INTRAMUSCULAR | Status: AC
  Administered 2013-11-10: 25 mg via INTRAVENOUS

## 2013-11-10 MED ORDER — MEPERIDINE HCL 100 MG/ML IJ SOLN
INTRAMUSCULAR | Status: DC | PRN
  Administered 2013-11-10: 25 mg via INTRAVENOUS
  Administered 2013-11-10: 50 mg via INTRAVENOUS
  Administered 2013-11-10: 25 mg via INTRAVENOUS

## 2013-11-10 MED ORDER — MEPERIDINE HCL 100 MG/ML IJ SOLN
INTRAMUSCULAR | Status: AC
  Filled 2013-11-10: qty 2

## 2013-11-10 MED ORDER — SODIUM CHLORIDE 0.9 % IV SOLN
INTRAVENOUS | Status: DC
  Administered 2013-11-10: 1000 mL via INTRAVENOUS

## 2013-11-10 MED ORDER — MIDAZOLAM HCL 5 MG/5ML IJ SOLN
INTRAMUSCULAR | Status: AC
  Filled 2013-11-10: qty 10

## 2013-11-10 MED ORDER — MIDAZOLAM HCL 5 MG/5ML IJ SOLN
INTRAMUSCULAR | Status: DC | PRN
  Administered 2013-11-10 (×2): 1 mg via INTRAVENOUS
  Administered 2013-11-10: 2 mg via INTRAVENOUS

## 2013-11-10 MED ORDER — ONDANSETRON HCL 4 MG/2ML IJ SOLN
INTRAMUSCULAR | Status: DC | PRN
  Administered 2013-11-10: 4 mg via INTRAVENOUS

## 2013-11-10 MED ORDER — STERILE WATER FOR IRRIGATION IR SOLN
Status: DC | PRN
  Administered 2013-11-10: 15:00:00

## 2013-11-10 MED ORDER — ONDANSETRON HCL 4 MG/2ML IJ SOLN
INTRAMUSCULAR | Status: AC
  Filled 2013-11-10: qty 2

## 2013-11-10 MED ORDER — PROMETHAZINE HCL 25 MG/ML IJ SOLN
INTRAMUSCULAR | Status: AC
  Filled 2013-11-10: qty 1

## 2013-11-10 MED ORDER — SODIUM CHLORIDE 0.9 % IJ SOLN
INTRAMUSCULAR | Status: AC
  Filled 2013-11-10: qty 10

## 2013-11-10 NOTE — H&P (View-Only) (Signed)
Quick Note:  LFTs look great.  Repeat in 1 year. ______

## 2013-11-10 NOTE — H&P (View-Only) (Signed)
Primary Care Physician:  Leonides Grills, MD  Primary Gastroenterologist:  Garfield Cornea, MD   Chief Complaint  Patient presents with  . Hemorrhoids    consult on banding w/ sedation    HPI:  SRIKAR CHIANG is a 48 y.o. male here to discuss hemorrhoid banding. Patient recently had a colonoscopy and was found to have multiple to adenomas which were removed. He had prominent grade 3 hemorrhoids felt because of his hematochezia. Dr. Gala Romney felt like patient would benefit from hemorrhoid banding, in this case endoscopic-assisted under conscious sedation.   Patient continues to have complaints of rectal itching, burning, bleeding. Has at least 2 bowel movements daily. Some postprandial urgency. Rarely has any straining. In the past he had some constipation but none recently. 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 Denies any current problems with abdominal pain, heartburn, weight loss. Patient tells me he is not interested in pursuing hemorrhoid banding awake. Worried about significant rectal pain. Prefers endoscopic hemorrhoid banding under sedation.  Current Outpatient Prescriptions  Medication Sig Dispense Refill  . amitriptyline (ELAVIL) 25 MG tablet Take 25 mg by mouth at bedtime.      . DULoxetine (CYMBALTA) 60 MG capsule Take 60 mg by mouth daily.      . hydrocortisone (CORTEF) 20 MG tablet Take 0.5 tablets (10 mg total) by mouth daily. For cortisol replacement  15 tablet  0  . levothyroxine (SYNTHROID, LEVOTHROID) 100 MCG tablet Take 100 mcg by mouth daily before breakfast.      . lidocaine (LIDODERM) 5 % Place 1 patch onto the skin daily as needed (Pain). Remove & Discard patch within 12 hours or as directed by MD      . lidocaine (XYLOCAINE) 5 % ointment Apply 1 application topically as needed for mild pain.      Marland Kitchen lisinopril (PRINIVIL,ZESTRIL) 20 MG tablet Take 20 mg by mouth daily.       . methocarbamol (ROBAXIN) 750 MG tablet Take 750 mg by mouth  every 6 (six) hours as needed. Muscle spasm      . Multiple Vitamins-Minerals (MENS MULTIVITAMIN PLUS) TABS Take 1 tablet by mouth daily. /For nutritional supplementation.  30 tablet  0  . omeprazole (PRILOSEC) 40 MG capsule Take 40 mg by mouth daily.      . simvastatin (ZOCOR) 20 MG tablet Take 1 tablet (20 mg total) by mouth at bedtime. To help lower cholesterol.  30 tablet  0  . Tamsulosin HCl (FLOMAX) 0.4 MG CAPS Take 1 capsule (0.4 mg total) by mouth daily. For breathing  30 capsule  0   No current facility-administered medications for this visit.    Allergies as of 11/03/2013 - Review Complete 10/08/2013  Allergen Reaction Noted  . Hydromorphone hcl Other (See Comments)     Past Medical History  Diagnosis Date  . Sleep apnea     cpap  . GERD (gastroesophageal reflux disease)     erosive reflux esophagitis, small HH  . Thyroid disease   . Back pain, chronic 02/18/11    BULGING DISK  . IBS (irritable bowel syndrome)   . Hemorrhoids   . S/P colonoscopy 08/2008    int/ext hemorrhoids. friable, rectal polyp  . Tubular adenoma of colon 2000  . Hyperlipemia   . S/P endoscopy 01/10/09    Dr Vivi Ferns, small HH  . Low serum cortisol level     Dr Suzette Battiest  . UTI (lower urinary tract infection)   .  Lower extremity edema   . Plantar fasciitis   . Spinal stenosis of lumbar region 12/18/2012  . Nephrolithiasis 12/19/2012    Past Surgical History  Procedure Laterality Date  . Splenectomy  2003    itp  . Cholecystectomy  2006  . Knee surgery      left  . Distal biceps tendon repair  02/20/2012    Procedure: DISTAL BICEPS TENDON REPAIR;  Surgeon: Lorn Junes, MD;  Location: [facility];  Service: Orthopedics;  Laterality: Left;  left elbow distal biceps tendon repair  . Colonoscopy  2000    Dr. Gala Romney- tubular adenoma  . Colonoscopy  08/10/2008    Dr. Gala Romney-  IMPRESSION:  Prominent external anal canal hemorrhoids, hyperplastic polyp  . Esophagogastroduodenoscopy   2010    Dr. Gala Romney: normal  . Colonoscopy N/A 10/08/2013    WVP:XTGGYIRS colonic polyps -  removed as described above/Prominent grade 3 hemorrhoids -  likely source of hematochezia. tubular adenomas. next TCS 10/2016    Family History  Problem Relation Age of Onset  . Colon cancer Father 24    deceased 6 months after diagnosis  . Diabetes Brother     History   Social History  . Marital Status: Married    Spouse Name: N/A    Number of Children: 1  . Years of Education: N/A   Occupational History  . truck driver-local     Social History Main Topics  . Smoking status: Former Smoker -- 1.00 packs/day for 30 years    Types: Cigarettes    Quit date: 03/05/2007  . Smokeless tobacco: Not on file  . Alcohol Use: No     Comment: hx heavy etoh, quit 4 y ago  . Drug Use: No     Comment: quit 4ys ago, chronic marijuana  . Sexual Activity: Yes   Other Topics Concern  . Not on file   Social History Narrative   1 healthy son      ROS:  General: Negative for anorexia, weight loss, fever, chills, fatigue, weakness. Eyes: Negative for vision changes.  ENT: Negative for hoarseness, difficulty swallowing , nasal congestion. CV: Negative for chest pain, angina, palpitations, dyspnea on exertion, peripheral edema.  Respiratory: Negative for dyspnea at rest, dyspnea on exertion, cough, sputum, wheezing.  GI: See history of present illness. GU:  Negative for dysuria, hematuria, urinary incontinence, urinary frequency, nocturnal urination.  MS: Negative for joint pain, low back pain.  Derm: Negative for rash or itching.  Neuro: Negative for weakness, abnormal sensation, seizure, frequent headaches, memory loss, confusion.  Psych: Negative for anxiety, depression, suicidal ideation, hallucinations.  Endo: Negative for unusual weight change.  Heme: Negative for bruising or bleeding. Allergy: Negative for rash or hives.    Physical Examination:  BP 136/88  Pulse 84  Temp(Src) 97.3 F  (36.3 C) (Oral)  Resp 20  Ht 5\' 8"  (1.727 m)  Wt 331 lb 3.2 oz (150.231 kg)  BMI 50.37 kg/m2   General: Well-nourished, well-developed in no acute distress.  Head: Normocephalic, atraumatic.   Eyes: Conjunctiva pink, no icterus. Mouth: Oropharyngeal mucosa moist and pink , no lesions erythema or exudate. Neck: Supple without thyromegaly, masses, or lymphadenopathy.  Lungs: Clear to auscultation bilaterally.  Heart: Regular rate and rhythm, no murmurs rubs or gallops.  Abdomen: Bowel sounds are normal, nontender, nondistended, no hepatosplenomegaly or masses, no abdominal bruits or    hernia , no rebound or guarding.   Rectal: Not performed Extremities: No lower extremity  edema. No clubbing or deformities.  Neuro: Alert and oriented x 4 , grossly normal neurologically.  Skin: Warm and dry, no rash or jaundice.   Psych: Alert and cooperative, normal mood and affect.

## 2013-11-10 NOTE — Interval H&P Note (Signed)
History and Physical Interval Note:  11/10/2013 2:17 PM  [name] [name]  has presented today for surgery, with the diagnosis of HEMORRHOID  The various methods of treatment have been discussed with the patient and family. After consideration of risks, benefits and other options for treatment, the patient has consented to  Procedure(s) with comments: FLEXIBLE SIGMOIDOSCOPY (N/A) - 2:15 HEMORRHOID BANDING (N/A) as a surgical intervention .  The patient's history has been reviewed, patient examined, no change in status, stable for surgery.  I have reviewed the patient's chart and labs.  Questions were answered to the patient's satisfaction.     Cristopher Estimable Spruha Weight

## 2013-11-10 NOTE — Interval H&P Note (Signed)
History and Physical Interval Note:  11/10/2013 2:28 PM  [name] [name]  has presented today for surgery, with the diagnosis of HEMORRHOID  The various methods of treatment have been discussed with the patient and family. After consideration of risks, benefits and other options for treatment, the patient has consented to  Procedure(s) with comments: FLEXIBLE SIGMOIDOSCOPY (N/A) - 2:15 HEMORRHOID BANDING (N/A) as a surgical intervention .  The patient's history has been reviewed, patient examined, no change in status, stable for surgery.  I have reviewed the patient's chart and labs.  Questions were answered to the patient's satisfaction.     [name] [name]  Discussed hemorrhoid banding with the patient. He is anxious. Will use conscious sedation along with the gastroscope and the variceal banding kit. I specifically discussed that there is  no 100% guarantee that all of his hemorrhoid symptoms improve with this maneuver today. Also, it is unknown how long the effectiveness of this approach will last. The alternatives, risks and benefits have been reviewed. Questions answered. He is agreeable.

## 2013-11-10 NOTE — Discharge Instructions (Signed)
Avoid straining.  Benefiber 2 teaspoons twice daily  Limit toilet time to 2-3 minutes  Call with any interim problems  Schedule followup appointment in 8 weeks from now        July 27th at 10:30am Hemorrhoid Banding Hemorrhoids are veins in the anus and lower rectum that become enlarged. The most common symptoms are rectal bleeding, itching, and sometimes pain. Hemorrhoids might come out with straining or having a bowel movement, and they can sometimes be pushed back in. There are internal and external hemorrhoids. Only internal hemorrhoids can be treated with banding. In this procedure, a rubber band is placed near the hemorrhoid tissue, cutting off the blood supply. This procedure prevents the hemorrhoids from slipping down. LET YOUR CAREGIVER KNOW ABOUT: All medicines you are taking, especially blood thinners such as aspirin and coumadin.  RISKS AND COMPLICATIONS This is not a painful procedure, but if you do have intense pain immediately let your surgeon know because the band may need to be removed. You may have some mild pain or discomfort in the first 2 days or so after treatment. Sometimes there may be delayed bleeding in the first week after treatment.  BEFORE THE PROCEDURE  There is no special preparation needed before banding. Your surgeon may have you do an enema prior to the procedure. You will go home the same day.  HOME CARE INSTRUCTIONS   Your surgeon might instruct you to do sitz baths as needed if you have discomfort or after a bowel movement.  You may be instructed to use fiber supplements. SEEK MEDICAL CARE IF:  You have an increase in pain.  Your pain does not get better. SEEK IMMEDIATE MEDICAL CARE IF:  You have intense pain.  Fever greater than 100.5 F (38.1 C).  Bleeding that does not stop, or pus from the anus. Document Released: 03/18/2009 Document Revised: 08/13/2011 Document Reviewed: 03/18/2009 [facility] Patient Information 2014 [place],  [place]. Flexible Sigmoidoscopy Your caregiver has ordered a flexible sigmoidoscopy. This is an exam to evaluate your lower colon. In this exam your colon is cleansed and a short fiber optic tube is inserted through your rectum and into your colon. The fiber optic scope (endoscope) is a short bundle of enclosed flexible small glass fibers. It transmits light to the area examined and images from that area to your caregiver. You do not have to worry about glass breakage in the endoscope. Discomfort is usually minimal. Sedatives and pain medications are generally not required. This exam helps to detect tumors (lumps), polyps, inflammation (swelling and soreness), and areas of bleeding. It may also be used to take biopsies. These are small pieces of tissue taken to examine under a microscope. LET YOUR CAREGIVER KNOW ABOUT:  Allergies.  Medications taken including herbs, eye drops, over the counter medications, and creams.  Use of steroids (by mouth or creams).  Previous problems with anesthetics or novocaine  Possibility of pregnancy, if this applies.  History of blood clots (thrombophlebitis).  History of bleeding or blood problems.  Previous surgery.  Other health problems. BEFORE THE PROCEDURE Eat normally the night before the exam. Your caregiver may order a mild enema or laxative the night before. No eating or drinking should occur after midnight until the procedure is completed. A rectal suppository or enemas may be given in the morning prior to your procedure. You will be brought to the examination area in a hospital gown. You should be present 60 minutes prior to your procedure or as directed.  AFTER  THE PROCEDURE   There is sometimes a little blood passed with the first bowel movement. Do not be concerned. Because air is often used during the exam, it is not unusual to pass gas and experience abdominal (belly) cramping. Walking or a warm pack on your abdomen may help with this. Do not  sleep with a heating pad as burns can occur.  You may resume all normal eating and activities.  Only take over-the-counter or prescription medicines for pain, discomfort, or fever as directed by your caregiver. Do not use aspirin or blood thinners if a biopsy (tissue sample) was taken. Consult your caregiver for medication usage if biopsies were taken.  Call for your results as instructed by your caregiver. Remember, it is your responsibility to obtain the results of your biopsy. Do not assume everything is fine because you do not hear from your caregiver. SEEK IMMEDIATE MEDICAL CARE IF:  An oral temperature above 102 F (38.9 C) develops.  You pass large blood clots or fill a toilet with blood following the procedure. This may also occur 10 to 14 days following the procedure. It is more likely if a biopsy was taken.  You develop abdominal pain not relieved with medication or that is getting worse rather than better. Document Released: 05/18/2000 Document Revised: 08/13/2011 Document Reviewed: 02/28/2005 [facility] Patient Information 2014 [place].

## 2013-11-10 NOTE — Op Note (Signed)
NAME:  [name] [name], [name] [name]                  ACCOUNT NO.:  [id number]  MEDICAL RECORD NO.:  [id number]  LOCATION:  [facility]                          FACILITY:  [facility]  PHYSICIAN:  R. Garfield Cornea, MD [place]:  January 13, 1966  DATE OF PROCEDURE:  11/10/2013 DATE OF DISCHARGE:                              OPERATIVE REPORT   PROCEDURE:  Sigmoidoscopy with band ligation of hemorrhoids.  INDICATIONS FOR PROCEDURE:  48 year old gentleman with longstanding hemorrhoids symptoms including burning, stinging, prolapse, fecal seepage and itching.  He occasionally has to strain to have a bowel movement.  He does not use any significant fiber supplement.  He is a former Administrator.  He had a colonoscopy recently revealing grade 3 hemorrhoids.  He had multiple colonic adenomas removed.  He is desirous of having band ligation.  I discussed with him at length today.  There is no 100% guarantee that band ligation would take care of all of his hemorrhoidal symptoms.  Some of his prolapsing hemorrhoid disease may persist following this procedure.  Also, the durability of any improvement in symptoms from band ligation is unknown.  His questions have been answered.  He desires for [place] to proceed.  PROCEDURE NOTE:  The patient was placed in left lateral decubitus position.  O2 saturation, blood pressure, pulse, respirations were monitored throughout the entire procedure.  CONSCIOUS SEDATION:  Versed 4 mg IV and Demerol 100 mg IV in divided doses.  Phenergan 25 mg IV.  Zofran 4 mg IV.  INSTRUMENT:  Pentax video chip gastroscope.  FINDINGS:  Inspection of the anal canal demonstrated some prolapsing external hemorrhoids.  They were to a great extent reducible with the finger.  There was some protruding hemorrhoidal tissue, however. Examination of the rectum revealed no abnormalities aside from, upon retroflexion, some friable hemorrhoidal tissue adjacent to the scope in 3 quadrants - did appear to extend  distally.  These areas were above the anal verge.  Dr. Oneida Alar was present for this portion of the procedure and agreed banding of the internal component would likely be of some benefit. Decision was made to proceed with banding.  Subsequently, the gastroscope was removed.  The Microvasive 7-shot bander was attached. Scope was reintroduced into the rectum, and 3 redundant inflamed areas of internal hemorrhoid columns were each engaged with suction and pulled up into the band deployment device.  With each engagement, the patient was asked if he experienced any pinching or pain.  He denied pinching, pain, or any discomfort whatsoever as each of the 3 bands were applied.  Bands were applied without difficulty or apparent complication.   He was taken to the postop in stable condition.  IMPRESSION:  Internal/external hemorrhoids.  External hemorrhoids, grade 3 with some residual prolapsing tissue.  Normal rectum aside from hemorrhoids  -  status post band placement as described above.  RECOMMENDATIONS: 1. Limit straining, limit total time to 2 to 5 minutes. 2. Begin Benefiber 2 teaspoons twice daily. 3. Office visit with [place] in 8 weeks. Call with any interim problems.     Bridgette Habermann, MD [facility]     RMR/MEDQ  D:  11/10/2013  T:  11/10/2013  Job:  [id number]  cc:   Dr. Silvio Clayman

## 2013-11-13 ENCOUNTER — Encounter (HOSPITAL_COMMUNITY): Payer: Self-pay | Admitting: Internal Medicine

## 2013-12-28 ENCOUNTER — Ambulatory Visit: Payer: Self-pay | Admitting: Internal Medicine

## 2014-07-16 ENCOUNTER — Other Ambulatory Visit: Payer: Self-pay

## 2014-07-16 MED ORDER — OMEPRAZOLE 40 MG PO CPDR: 40.0000 mg | DELAYED_RELEASE_CAPSULE | Freq: Every day | ORAL | Status: DC

## 2014-09-28 ENCOUNTER — Other Ambulatory Visit: Payer: Self-pay

## 2014-09-28 DIAGNOSIS — K760 Fatty (change of) liver, not elsewhere classified: Secondary | ICD-10-CM

## 2014-10-21 ENCOUNTER — Telehealth: Payer: Self-pay | Admitting: Internal Medicine

## 2014-10-21 NOTE — Telephone Encounter (Signed)
Called and spoke with the pt. He said he had blood work done at [employer] 2 weeks ago and he hasnt heard from them yet and he is not sure what labs they drew. I informed him that we were checking LFT's and if they drew them at [facility] that he could ask them to fax the results or he could bring [place] a copy. He said he will ask when they call him and will make sure we get a copy.

## 2014-10-21 NOTE — Telephone Encounter (Signed)
[phone]  PLEASE CALL PATIENT REGARDING LABS   HE HAD SOME DRAWN AT [facility] AND WAS WONDERING IF THEY CAN BE USED HERE BECAUSE HE NO LONGER HAS INSURANCE

## 2016-01-11 DIAGNOSIS — M25561 Pain in right knee: Secondary | ICD-10-CM | POA: Diagnosis not present

## 2016-01-11 DIAGNOSIS — M25562 Pain in left knee: Secondary | ICD-10-CM | POA: Diagnosis not present

## 2016-01-18 DIAGNOSIS — Z6841 Body Mass Index (BMI) 40.0 and over, adult: Secondary | ICD-10-CM | POA: Diagnosis not present

## 2016-01-18 DIAGNOSIS — E039 Hypothyroidism, unspecified: Secondary | ICD-10-CM | POA: Diagnosis not present

## 2016-01-18 DIAGNOSIS — E119 Type 2 diabetes mellitus without complications: Secondary | ICD-10-CM | POA: Diagnosis not present

## 2016-01-18 DIAGNOSIS — Z1389 Encounter for screening for other disorder: Secondary | ICD-10-CM | POA: Diagnosis not present

## 2016-01-18 DIAGNOSIS — E271 Primary adrenocortical insufficiency: Secondary | ICD-10-CM | POA: Diagnosis not present

## 2016-01-18 DIAGNOSIS — J0100 Acute maxillary sinusitis, unspecified: Secondary | ICD-10-CM | POA: Diagnosis not present

## 2016-01-18 DIAGNOSIS — R1901 Right upper quadrant abdominal swelling, mass and lump: Secondary | ICD-10-CM | POA: Diagnosis not present

## 2016-01-23 DIAGNOSIS — R945 Abnormal results of liver function studies: Secondary | ICD-10-CM | POA: Diagnosis not present

## 2016-02-03 DIAGNOSIS — N200 Calculus of kidney: Secondary | ICD-10-CM | POA: Diagnosis not present

## 2016-02-03 DIAGNOSIS — K760 Fatty (change of) liver, not elsewhere classified: Secondary | ICD-10-CM | POA: Diagnosis not present

## 2016-02-03 DIAGNOSIS — Z9049 Acquired absence of other specified parts of digestive tract: Secondary | ICD-10-CM | POA: Diagnosis not present

## 2016-02-03 DIAGNOSIS — R160 Hepatomegaly, not elsewhere classified: Secondary | ICD-10-CM | POA: Diagnosis not present

## 2016-03-02 DIAGNOSIS — E1165 Type 2 diabetes mellitus with hyperglycemia: Secondary | ICD-10-CM | POA: Diagnosis not present

## 2016-03-02 DIAGNOSIS — Z6841 Body Mass Index (BMI) 40.0 and over, adult: Secondary | ICD-10-CM | POA: Diagnosis not present

## 2016-03-02 DIAGNOSIS — Z0001 Encounter for general adult medical examination with abnormal findings: Secondary | ICD-10-CM | POA: Diagnosis not present

## 2016-03-02 DIAGNOSIS — Z1389 Encounter for screening for other disorder: Secondary | ICD-10-CM | POA: Diagnosis not present

## 2016-03-02 DIAGNOSIS — J069 Acute upper respiratory infection, unspecified: Secondary | ICD-10-CM | POA: Diagnosis not present

## 2016-06-29 DIAGNOSIS — J209 Acute bronchitis, unspecified: Secondary | ICD-10-CM | POA: Diagnosis not present

## 2016-06-29 DIAGNOSIS — J069 Acute upper respiratory infection, unspecified: Secondary | ICD-10-CM | POA: Diagnosis not present

## 2016-06-29 DIAGNOSIS — Z6841 Body Mass Index (BMI) 40.0 and over, adult: Secondary | ICD-10-CM | POA: Diagnosis not present

## 2016-06-29 DIAGNOSIS — Z1389 Encounter for screening for other disorder: Secondary | ICD-10-CM | POA: Diagnosis not present

## 2016-06-29 DIAGNOSIS — Z23 Encounter for immunization: Secondary | ICD-10-CM | POA: Diagnosis not present

## 2016-07-04 DIAGNOSIS — G4733 Obstructive sleep apnea (adult) (pediatric): Secondary | ICD-10-CM | POA: Diagnosis not present

## 2016-08-10 ENCOUNTER — Ambulatory Visit (HOSPITAL_COMMUNITY)
Admission: RE | Admit: 2016-08-10 | Discharge: 2016-08-10 | Disposition: A | Discharge status: Home / Self Care | Payer: BLUE CROSS/BLUE SHIELD | Source: Ambulatory Visit | Attending: Family Medicine | Admitting: Family Medicine

## 2016-08-10 ENCOUNTER — Other Ambulatory Visit (HOSPITAL_COMMUNITY): Payer: Self-pay | Admitting: Family Medicine

## 2016-08-10 DIAGNOSIS — T464X5A Adverse effect of angiotensin-converting-enzyme inhibitors, initial encounter: Secondary | ICD-10-CM | POA: Diagnosis not present

## 2016-08-10 DIAGNOSIS — R05 Cough: Secondary | ICD-10-CM

## 2016-08-10 DIAGNOSIS — Z1389 Encounter for screening for other disorder: Secondary | ICD-10-CM | POA: Diagnosis not present

## 2016-08-10 DIAGNOSIS — R059 Cough, unspecified: Secondary | ICD-10-CM

## 2016-08-10 DIAGNOSIS — Z6841 Body Mass Index (BMI) 40.0 and over, adult: Secondary | ICD-10-CM | POA: Diagnosis not present

## 2016-08-27 ENCOUNTER — Emergency Department (HOSPITAL_COMMUNITY)
Admission: EM | Admit: 2016-08-27 | Discharge: 2016-08-27 | Disposition: A | Discharge status: Home / Self Care | Payer: BLUE CROSS/BLUE SHIELD | Attending: Emergency Medicine | Admitting: Emergency Medicine

## 2016-08-27 ENCOUNTER — Emergency Department (HOSPITAL_COMMUNITY): Payer: BLUE CROSS/BLUE SHIELD

## 2016-08-27 ENCOUNTER — Encounter (HOSPITAL_COMMUNITY): Payer: Self-pay | Admitting: Emergency Medicine

## 2016-08-27 DIAGNOSIS — Y939 Activity, unspecified: Secondary | ICD-10-CM | POA: Insufficient documentation

## 2016-08-27 DIAGNOSIS — Y92009 Unspecified place in unspecified non-institutional (private) residence as the place of occurrence of the external cause: Secondary | ICD-10-CM | POA: Insufficient documentation

## 2016-08-27 DIAGNOSIS — Y999 Unspecified external cause status: Secondary | ICD-10-CM | POA: Diagnosis not present

## 2016-08-27 DIAGNOSIS — Z79899 Other long term (current) drug therapy: Secondary | ICD-10-CM | POA: Insufficient documentation

## 2016-08-27 DIAGNOSIS — W010XXA Fall on same level from slipping, tripping and stumbling without subsequent striking against object, initial encounter: Secondary | ICD-10-CM | POA: Insufficient documentation

## 2016-08-27 DIAGNOSIS — Z87891 Personal history of nicotine dependence: Secondary | ICD-10-CM | POA: Insufficient documentation

## 2016-08-27 DIAGNOSIS — E039 Hypothyroidism, unspecified: Secondary | ICD-10-CM | POA: Diagnosis not present

## 2016-08-27 DIAGNOSIS — I10 Essential (primary) hypertension: Secondary | ICD-10-CM | POA: Diagnosis not present

## 2016-08-27 DIAGNOSIS — M545 Low back pain: Secondary | ICD-10-CM | POA: Diagnosis not present

## 2016-08-27 DIAGNOSIS — S300XXA Contusion of lower back and pelvis, initial encounter: Secondary | ICD-10-CM | POA: Diagnosis not present

## 2016-08-27 DIAGNOSIS — M5442 Lumbago with sciatica, left side: Secondary | ICD-10-CM

## 2016-08-27 DIAGNOSIS — S3992XA Unspecified injury of lower back, initial encounter: Secondary | ICD-10-CM | POA: Diagnosis not present

## 2016-08-27 MED ORDER — METHOCARBAMOL 500 MG PO TABS: 500.0000 mg | ORAL_TABLET | Freq: Three times a day (TID) | ORAL | 0 refills | Status: DC

## 2016-08-27 MED ORDER — METHOCARBAMOL 500 MG PO TABS
500.0000 mg | ORAL_TABLET | Freq: Once | ORAL | Status: AC
  Administered 2016-08-27: 500 mg via ORAL
  Filled 2016-08-27: qty 1

## 2016-08-27 MED ORDER — NAPROXEN 500 MG PO TABS: 500.0000 mg | ORAL_TABLET | Freq: Two times a day (BID) | ORAL | 0 refills | Status: DC

## 2016-08-27 MED ORDER — KETOROLAC TROMETHAMINE 60 MG/2ML IM SOLN
60.0000 mg | Freq: Once | INTRAMUSCULAR | Status: AC
  Administered 2016-08-27: 60 mg via INTRAMUSCULAR
  Filled 2016-08-27: qty 2

## 2016-08-27 MED ORDER — OXYCODONE-ACETAMINOPHEN 5-325 MG PO TABS: 1.0000 | ORAL_TABLET | ORAL | 0 refills | Status: DC | PRN

## 2016-08-27 NOTE — ED Triage Notes (Signed)
PT states he slipped and fell down the last outdoor step at his house yesterday around 1100 and c/o bruising and pain to left lower back. PT ambulatroy in triage. PT denies any urinary or bowel symptoms since fall.

## 2016-08-27 NOTE — ED Provider Notes (Signed)
[facility] DEPT Provider Note   CSN: [id number] Arrival date & time: 08/27/16  1444  By signing my name below, I, Collene Leyden, attest that this documentation has been prepared under the direction and in the presence of Myer Bohlman PA-C. Electronically Signed: Collene Leyden, Scribe. 08/27/16. 5:06 PM.  History   Chief Complaint Chief Complaint  Patient presents with  . Fall    HPI Comments: [name] [name] is a 51 y.o. male with a history of chornic back pain, who presents to the Emergency Department complaining of constant left lower back pain s/p mechanical fall that happened one day prior . Patient states he fell down 1 step, injuring his lower back. Patient reports associated bruising, burning, and pain radiation intermittently down the left leg. Patient reports applying two lidocaine patches on the area with no relief. Patient states the pain is worse with twisting the torso and ambulating. Patient states his pain improves when lying on his abdomen. Patient is ambulatory in the emergency department. Patient denies any loss of bowel/bladder control, syncope, head injury, numbness, weakness, or tingling.   The history is provided by the patient. No language interpreter was used.    Past Medical History:  Diagnosis Date  . Back pain, chronic 02/18/11   BULGING DISK  . GERD (gastroesophageal reflux disease)    erosive reflux esophagitis, small HH  . Hemorrhoids   . Hyperlipemia   . Hypertension   . Hypothyroidism   . IBS (irritable bowel syndrome)   . Low serum cortisol level (HCC)    Dr Suzette Battiest  . Lower extremity edema   . Nephrolithiasis 12/19/2012  . Plantar fasciitis   . S/P colonoscopy 08/2008   int/ext hemorrhoids. friable, rectal polyp  . S/P endoscopy 01/10/09   Dr Vivi Ferns, small HH  . Sleep apnea    cpap  . Spinal stenosis of lumbar region 12/18/2012  . Thyroid disease   . Tubular adenoma of colon 2000  . UTI (lower urinary tract infection)     Patient  Active Problem List   Diagnosis Date Noted  . Hemorrhoids, complicated 74/25/9563  . Nephrolithiasis 12/19/2012  . Spinal stenosis of lumbar region 12/18/2012  . RBBB 12/17/2012  . Unspecified hypothyroidism 12/17/2012  . Adrenal insufficiency ([place]) 12/17/2012  . Hyperglycemia, drug-induced 12/17/2012  . Hypokalemia 12/16/2012  . Traumatic rupture of distal biceps tendon 02/20/2012  . Depression 08/22/2011  . Adenomatous polyps 06/29/2011  . Abnormal CT scan, small bowel 03/05/2011  . Fatty liver 03/05/2011  . Pyelonephritis 02/19/2011  . Status post splenectomy 02/19/2011  . History of ITP 02/19/2011  . OSA on CPAP 02/19/2011  . GLOBUS HYSTERICUS 01/05/2009  . Irritable bowel syndrome 01/05/2009  . DYSPHAGIA UNSPECIFIED 01/05/2009  . GERD 01/04/2009  . HIATAL HERNIA 01/04/2009  . HEMATOCHEZIA 01/04/2009  . HEMOPTYSIS 01/04/2009  . ABDOMINAL PAIN 01/04/2009    Past Surgical History:  Procedure Laterality Date  . CHOLECYSTECTOMY  2006  . COLONOSCOPY  2000   Dr. Gala Romney- tubular adenoma  . COLONOSCOPY  08/10/2008   Dr. Gala Romney-  IMPRESSION:  Prominent external anal canal hemorrhoids, hyperplastic polyp  . COLONOSCOPY N/A 10/08/2013   [id number] colonic polyps -  removed as described above/Prominent grade 3 hemorrhoids -  likely source of hematochezia. tubular adenomas. next TCS 10/2016  . DISTAL BICEPS TENDON REPAIR  02/20/2012   Procedure: DISTAL BICEPS TENDON REPAIR;  Surgeon: Lorn Junes, MD;  Location: [facility];  Service: Orthopedics;  Laterality: Left;  left elbow distal biceps  tendon repair  . ESOPHAGOGASTRODUODENOSCOPY  2010   Dr. Gala Romney: normal  . FLEXIBLE SIGMOIDOSCOPY N/A 11/10/2013   Dr.Rourk- internal/external hemorrhoids. external hemorrhoids grade 3 with some residual prolapsing tissue. normal rectum aside form hemorrhoids. s/p band placement.  Marland Kitchen HEMORRHOID BANDING N/A 11/10/2013   Procedure: HEMORRHOID BANDING;  Surgeon: Daneil Dolin, MD;  [facility];  Service: Endoscopy;  Laterality: N/A;  . KNEE SURGERY     left  . SPLENECTOMY  2003   itp       Home Medications    Prior to Admission medications   Medication Sig Start Date End Date Taking? Authorizing Provider  amitriptyline (ELAVIL) 25 MG tablet Take 25 mg by mouth at bedtime.    Historical Provider, MD  DULoxetine (CYMBALTA) 60 MG capsule Take 60 mg by mouth daily.    Historical Provider, MD  hydrocortisone (CORTEF) 20 MG tablet Take 0.5 tablets (10 mg total) by mouth daily. For cortisol replacement 08/23/11   Darrol Jump, MD  levothyroxine (SYNTHROID, LEVOTHROID) 100 MCG tablet Take 100 mcg by mouth daily before breakfast.    Historical Provider, MD  lidocaine (LIDODERM) 5 % Place 1 patch onto the skin daily as needed (Pain). Remove & Discard patch within 12 hours or as directed by MD    Historical Provider, MD  lidocaine (XYLOCAINE) 5 % ointment Apply 1 application topically as needed for mild pain.    Historical Provider, MD  lisinopril (PRINIVIL,ZESTRIL) 20 MG tablet Take 20 mg by mouth daily.  09/08/13   Historical Provider, MD  methocarbamol (ROBAXIN) 750 MG tablet Take 750 mg by mouth every 6 (six) hours as needed. Muscle spasm    Historical Provider, MD  Multiple Vitamins-Minerals (MENS MULTIVITAMIN PLUS) TABS Take 1 tablet by mouth daily. /For nutritional supplementation. 08/23/11   Darrol Jump, MD  omeprazole (PRILOSEC) 40 MG capsule Take 1 capsule (40 mg total) by mouth daily. 07/16/14   Annitta Needs, NP  simvastatin (ZOCOR) 20 MG tablet Take 1 tablet (20 mg total) by mouth at bedtime. To help lower cholesterol. 08/23/11   Darrol Jump, MD  Tamsulosin HCl (FLOMAX) 0.4 MG CAPS Take 1 capsule (0.4 mg total) by mouth daily. For breathing 08/23/11   Darrol Jump, MD    Family History Family History  Problem Relation Age of Onset  . Colon cancer Father 46    deceased 6 months after diagnosis  . Diabetes Brother     Social History Social History    Substance Use Topics  . Smoking status: Former Smoker    Packs/day: 1.00    Years: 30.00    Types: Cigarettes    Quit date: 03/05/2007  . Smokeless tobacco: Never Used  . Alcohol use No     Comment: hx heavy etoh, quit 4 y ago     Allergies   Hydromorphone hcl   Review of Systems Review of Systems  Constitutional: Negative for fever.  Cardiovascular: Negative for chest pain.  Gastrointestinal: Negative for abdominal pain, nausea and vomiting.  Musculoskeletal: Positive for arthralgias (left leg) and back pain.  Skin: Positive for color change (bruising lower back).  Neurological: Negative for weakness and numbness.     Physical Exam Updated Vital Signs BP 140/88 (BP Location: Right Arm)   Pulse 100   Temp 98.3 F (36.8 C) (Oral)   Resp 20   Ht 5\' 9"  (1.753 m)   Wt (!) 330 lb (149.7 kg)   SpO2 97%  BMI 48.73 kg/m   Physical Exam  Constitutional: He is oriented to person, place, and time. He appears well-developed and well-nourished. No distress.  Pt obese  HENT:  Head: Normocephalic and atraumatic.  Mouth/Throat: Oropharynx is clear and moist.  Neck: Normal range of motion. Neck supple.  Cardiovascular: Normal rate, regular rhythm and intact distal pulses.   Pulmonary/Chest: Effort normal and breath sounds normal. He exhibits no tenderness.  Abdominal: Soft. He exhibits no distension. There is no tenderness.  Musculoskeletal: Normal range of motion. He exhibits tenderness. He exhibits no edema or deformity.       Back:  TTP to the left lower lumbar spine and left lumbar paraspinal area. 6 cm area of ecchymosis to the left paraspinal region.   Neurological: He is alert and oriented to person, place, and time. He has normal strength. No sensory deficit. GCS eye subscore is 4. GCS verbal subscore is 5. GCS motor subscore is 6.  Reflex Scores:      Patellar reflexes are 2+ on the right side and 2+ on the left side.      Achilles reflexes are 2+ on the right side  and 2+ on the left side. Skin: Skin is warm and dry.  Psychiatric: He has a normal mood and affect.  Nursing note and vitals reviewed.    ED Treatments / Results  DIAGNOSTIC STUDIES: Oxygen Saturation is 97% on RA, normal by my interpretation.    COORDINATION OF CARE: 5:06 PM Discussed treatment plan with pt at bedside and pt agreed to plan.  Labs (all labs ordered are listed, but only abnormal results are displayed) Labs Reviewed - No data to display  EKG  EKG Interpretation None       Radiology Dg Lumbar Spine Complete  Result Date: 08/27/2016 CLINICAL DATA:  Pain after fall with bruising along the left lower back. EXAM: LUMBAR SPINE - COMPLETE 4+ VIEW COMPARISON:  12/17/2012 CT reformats of the lumbar spine FINDINGS: No acute vertebral body fracture. Slight disc space narrowing at L4-5. No spondylolysis nor spondylolisthesis. Mild physiologic anterior wedging of T11, T12 and L1. IMPRESSION: Chronic slight disc space narrowing at L4-5. No acute osseous abnormality. Electronically Signed   By: Ashley Royalty M.D.   On: 08/27/2016 17:51    Procedures Procedures (including critical care time)  Medications Ordered in ED Medications - No data to display   Initial Impression / Assessment and Plan / ED Course  I have reviewed the triage vital signs and the nursing notes.  Pertinent labs & imaging results that were available during my care of the patient were reviewed by me and considered in my medical decision making (see chart for details).     Patient with low back pain with sciatica.  No focal neuro deficits on exam.  Ambulates with a steady gait.  No concerning symptoms for emergent neurological or infectious process.  Pt feeling better after medications.  XR neg for fx.  Return precautions discussed.     Final Clinical Impressions(s) / ED Diagnoses   Final diagnoses:  Acute left-sided low back pain with left-sided sciatica    New Prescriptions New Prescriptions     No medications on file   I personally performed the services described in this documentation, which was scribed in my presence. The recorded information has been reviewed and is accurate.     Kem Parkinson, PA-C 08/28/16 [place], MD 08/30/16 [phone]

## 2016-08-27 NOTE — Discharge Instructions (Signed)
Alternate ice on/off to your back.  Follow-up with your doctor for recheck in one week if not improving

## 2016-09-06 ENCOUNTER — Encounter: Payer: Self-pay | Admitting: Internal Medicine

## 2016-10-04 DIAGNOSIS — Z6841 Body Mass Index (BMI) 40.0 and over, adult: Secondary | ICD-10-CM | POA: Diagnosis not present

## 2016-10-04 DIAGNOSIS — I10 Essential (primary) hypertension: Secondary | ICD-10-CM | POA: Diagnosis not present

## 2016-10-04 DIAGNOSIS — Z23 Encounter for immunization: Secondary | ICD-10-CM | POA: Diagnosis not present

## 2016-10-04 DIAGNOSIS — Z1389 Encounter for screening for other disorder: Secondary | ICD-10-CM | POA: Diagnosis not present

## 2016-10-04 DIAGNOSIS — E1165 Type 2 diabetes mellitus with hyperglycemia: Secondary | ICD-10-CM | POA: Diagnosis not present

## 2017-01-17 DIAGNOSIS — E782 Mixed hyperlipidemia: Secondary | ICD-10-CM | POA: Diagnosis not present

## 2017-01-17 DIAGNOSIS — Z1389 Encounter for screening for other disorder: Secondary | ICD-10-CM | POA: Diagnosis not present

## 2017-01-17 DIAGNOSIS — I10 Essential (primary) hypertension: Secondary | ICD-10-CM | POA: Diagnosis not present

## 2017-01-17 DIAGNOSIS — E1165 Type 2 diabetes mellitus with hyperglycemia: Secondary | ICD-10-CM | POA: Diagnosis not present

## 2017-01-17 DIAGNOSIS — Z6841 Body Mass Index (BMI) 40.0 and over, adult: Secondary | ICD-10-CM | POA: Diagnosis not present

## 2017-02-07 ENCOUNTER — Other Ambulatory Visit: Payer: Self-pay

## 2017-02-07 ENCOUNTER — Ambulatory Visit (INDEPENDENT_AMBULATORY_CARE_PROVIDER_SITE_OTHER): Payer: BLUE CROSS/BLUE SHIELD | Admitting: Gastroenterology

## 2017-02-07 ENCOUNTER — Encounter: Payer: Self-pay | Admitting: Gastroenterology

## 2017-02-07 VITALS — BP 150/89 | HR 83 | Temp 97.2°F | Ht 67.0 in | Wt 327.0 lb

## 2017-02-07 DIAGNOSIS — K625 Hemorrhage of anus and rectum: Secondary | ICD-10-CM | POA: Diagnosis not present

## 2017-02-07 DIAGNOSIS — D369 Benign neoplasm, unspecified site: Secondary | ICD-10-CM

## 2017-02-07 DIAGNOSIS — Z800 Family history of malignant neoplasm of digestive organs: Secondary | ICD-10-CM | POA: Diagnosis not present

## 2017-02-07 DIAGNOSIS — K469 Unspecified abdominal hernia without obstruction or gangrene: Secondary | ICD-10-CM | POA: Insufficient documentation

## 2017-02-07 DIAGNOSIS — K439 Ventral hernia without obstruction or gangrene: Secondary | ICD-10-CM

## 2017-02-07 DIAGNOSIS — Z860100 Personal history of colon polyps, unspecified: Secondary | ICD-10-CM

## 2017-02-07 MED ORDER — PEG 3350-KCL-NA BICARB-NACL 420 G PO SOLR: 4000.0000 mL | ORAL | 0 refills | Status: DC

## 2017-02-07 NOTE — Assessment & Plan Note (Signed)
51 year old gentleman due for surveillance colonoscopy for history of adenomatous colon polyps. Last colonoscopy in 2015. He has a family history of colon cancer, father deceased at age 43. Patient has had recurrent rectal bleeding. Underwent band ligation in 2015 endoscopically. Complains of ongoing postprandial fecal urgency which is been present since his gallbladder was removed. Couple stools per day.  At this time recommend colonoscopy, plan for deep sedation given inadequate conscious sedation previously.  I have discussed the risks, alternatives, benefits with regards to but not limited to the risk of reaction to medication, bleeding, infection, perforation and the patient is agreeable to proceed. Written consent to be obtained.  Patient may benefit from anti-spasmodic for fecal urgency. Await colonoscopy findings.

## 2017-02-07 NOTE — Assessment & Plan Note (Signed)
Abdominal hernias, more symptomatic for the patient. Consider surgical consultation. Patient aware of warning signs requiring emergent evaluation.

## 2017-02-07 NOTE — Patient Instructions (Signed)
1. Colonoscopy as scheduled. See separate instructions.  2. Continue to have your liver blood work monitored by your PCP at least once yearly.

## 2017-02-07 NOTE — Progress Notes (Signed)
Primary Care Physician:  Sharilyn Sites, MD  Primary Gastroenterologist:  Garfield Cornea, MD   Chief Complaint  Patient presents with  . Rectal Bleeding    HPI:  [name] [name] is a 51 y.o. male here To schedule three-year surveillance colonoscopy. He had multiple tubular adenomas removed at time of his last colonoscopy in May 2015. Father succumbed to colon cancer at age 65. Patient had a flexible sigmoidoscopy with band ligation of grade 3 hemorrhoids in June 2015.  Patient states he developed a hernia above the umbilicus the last several months. He saw his PCP who recommended simply monitoring for now. Since then however he had more discomfort almost on a daily basis. He would like to consider having his hernia repaired in the near future. He like to have his colonoscopy prior to this.  Continues to have postprandial urgency with meals. Noted since he has gallbladder removed. Generally has a couple bowel movements daily. Rarely has to strain. No nocturnal diarrhea. Has had some recurrent rectal bleeding, sometimes passes clots. Reflux is well-controlled. No dysphagia or vomiting.  Now on metformin 1000 mg twice a day. Hemoglobin A1c had jumped up to 9 but now has improved since bone metformin per patient.   Patient states he woke up during his last colonoscopy.   Current Outpatient Prescriptions  Medication Sig Dispense Refill  . amitriptyline (ELAVIL) 25 MG tablet Take 25 mg by mouth at bedtime.    Marland Kitchen aspirin EC 81 MG tablet Take 81 mg by mouth daily.    . DULoxetine (CYMBALTA) 60 MG capsule Take 60 mg by mouth daily.    . hydrocortisone (CORTEF) 20 MG tablet Take 0.5 tablets (10 mg total) by mouth daily. For cortisol replacement 15 tablet 0  . levothyroxine (SYNTHROID, LEVOTHROID) 100 MCG tablet Take 125 mcg by mouth daily before breakfast.     . losartan (COZAAR) 100 MG tablet Take 100 mg by mouth daily.    . metformin (FORTAMET) 1000 MG (OSM) 24 hr tablet Take 1,000 mg by mouth 2 (two)  times daily with a meal.    . Multiple Vitamins-Minerals (MENS MULTIVITAMIN PLUS) TABS Take 1 tablet by mouth daily. /For nutritional supplementation. 30 tablet 0  . Omega-3 Fatty Acids (OMEGA-3 FISH OIL PO) Take by mouth.    Marland Kitchen omeprazole (PRILOSEC) 40 MG capsule Take 1 capsule (40 mg total) by mouth daily. 30 capsule 5  . simvastatin (ZOCOR) 20 MG tablet Take 1 tablet (20 mg total) by mouth at bedtime. To help lower cholesterol. 30 tablet 0  . sitaGLIPtin (JANUVIA) 100 MG tablet Take 100 mg by mouth daily.     No current facility-administered medications for this visit.     Allergies as of 02/07/2017 - Review Complete 02/07/2017  Allergen Reaction Noted  . Hydromorphone hcl Other (See Comments)     Past Medical History:  Diagnosis Date  . Back pain, chronic 02/18/11   BULGING DISK  . GERD (gastroesophageal reflux disease)    erosive reflux esophagitis, small HH  . Hemorrhoids   . Hyperlipemia   . Hypertension   . Hypothyroidism   . IBS (irritable bowel syndrome)   . Low serum cortisol level (HCC)    Dr Suzette Battiest  . Lower extremity edema   . Nephrolithiasis 12/19/2012  . Plantar fasciitis   . S/P colonoscopy 08/2008   int/ext hemorrhoids. friable, rectal polyp  . S/P endoscopy 01/10/09   Dr Vivi Ferns, small HH  . Sleep apnea    cpap  . Spinal  stenosis of lumbar region 12/18/2012  . Thyroid disease   . Tubular adenoma of colon 2000  . UTI (lower urinary tract infection)     Past Surgical History:  Procedure Laterality Date  . CHOLECYSTECTOMY  2006  . COLONOSCOPY  2000   Dr. Gala Romney- tubular adenoma  . COLONOSCOPY  08/10/2008   Dr. Gala Romney-  IMPRESSION:  Prominent external anal canal hemorrhoids, hyperplastic polyp  . COLONOSCOPY N/A 10/08/2013   [id number] colonic polyps -  removed as described above/Prominent grade 3 hemorrhoids -  likely source of hematochezia. tubular adenomas. next TCS 10/2016  . DISTAL BICEPS TENDON REPAIR  02/20/2012   Procedure: DISTAL BICEPS TENDON  REPAIR;  Surgeon: Lorn Junes, MD;  Location: [facility];  Service: Orthopedics;  Laterality: Left;  left elbow distal biceps tendon repair  . ESOPHAGOGASTRODUODENOSCOPY  2010   Dr. Gala Romney: normal  . FLEXIBLE SIGMOIDOSCOPY N/A 11/10/2013   Dr.Rourk- internal/external hemorrhoids. external hemorrhoids grade 3 with some residual prolapsing tissue. normal rectum aside form hemorrhoids. s/p band placement.  Marland Kitchen HEMORRHOID BANDING N/A 11/10/2013   Procedure: HEMORRHOID BANDING;  Surgeon: Daneil Dolin, MD;  [facility];  Service: Endoscopy;  Laterality: N/A;  . KNEE SURGERY     left  . SPLENECTOMY  2003   itp    Family History  Problem Relation Age of Onset  . Colon cancer Father 58       deceased 6 months after diagnosis  . Diabetes Brother     Social History   Social History  . Marital status: Married    Spouse name: N/A  . Number of children: 1  . Years of education: N/A   Occupational History  . truck Animal nutritionist   Social History Main Topics  . Smoking status: Former Smoker    Packs/day: 1.00    Years: 30.00    Types: Cigarettes    Quit date: 03/05/2007  . Smokeless tobacco: Never Used  . Alcohol use No     Comment: hx heavy etoh, quit 4 y ago  . Drug use: No     Comment: quit 4ys ago, chronic marijuana  . Sexual activity: Yes   Other Topics Concern  . Not on file   Social History Narrative   1 healthy son      ROS:  General: Negative for anorexia, weight loss, fever, chills, fatigue, weakness. Eyes: Negative for vision changes.  ENT: Negative for hoarseness, difficulty swallowing , nasal congestion. CV: Negative for chest pain, angina, palpitations, dyspnea on exertion, peripheral edema.  Respiratory: Negative for dyspnea at rest, dyspnea on exertion, cough, sputum, wheezing.  GI: See history of present illness. GU:  Negative for dysuria, hematuria, urinary incontinence, urinary frequency, nocturnal urination.  MS:  Negative for joint pain, Positive low back pain.  Derm: Negative for rash or itching.  Neuro: Negative for weakness, abnormal sensation, seizure, frequent headaches, memory loss, confusion.  Psych: Negative for anxiety, depression, suicidal ideation, hallucinations.  Endo: Negative for unusual weight change.  Heme: Negative for bruising or bleeding. Allergy: Negative for rash or hives.    Physical Examination:  BP (!) 150/89   Pulse 83   Temp (!) 97.2 F (36.2 C) (Oral)   Ht 5\' 7"  (1.702 m)   Wt (!) 327 lb (148.3 kg)   BMI 51.22 kg/m    General: Well-nourished, well-developed in no acute distress.  Head: Normocephalic, atraumatic.   Eyes: Conjunctiva pink, no icterus. Mouth: Oropharyngeal mucosa moist and  pink , no lesions erythema or exudate. Neck: Supple without thyromegaly, masses, or lymphadenopathy.  Lungs: Clear to auscultation bilaterally.  Heart: Regular rate and rhythm, no murmurs rubs or gallops.  Abdomen: Bowel sounds are normal,nondistended but obese, no hepatosplenomegaly or masses, no abdominal bruits, no rebound or guarding.  Small bowel was noted above the umbilicus slightly tender, easily reducible Rectal: not performed Extremities: 1+ lower extremity edema. No clubbing or deformities.  Neuro: Alert and oriented x 4 , grossly normal neurologically.  Skin: Warm and dry, no rash or jaundice.   Psych: Alert and cooperative, normal mood and affect.    Imaging Studies: No results found.

## 2017-02-08 ENCOUNTER — Telehealth: Payer: Self-pay

## 2017-02-08 NOTE — Progress Notes (Signed)
cc'ed to pcp °

## 2017-02-08 NOTE — Telephone Encounter (Signed)
Called and informed pt of pre-op appt 02/13/17 at 11:00am. Letter mailed. 

## 2017-02-11 NOTE — Patient Instructions (Signed)
[name] [name]  02/11/2017     @PREFPERIOPPHARMACY @   Your procedure is scheduled on  02/25/2017 .  Report to [facility] at  700  A.M.  Call this number if you have problems the morning of surgery:  [phone]   Remember:  Do not eat food or drink liquids after midnight.  Take these medicines the morning of surgery with A SIP OF WATER  Cymbalta, losartan, prilosec.   Do not wear jewelry, make-up or nail polish.  Do not wear lotions, powders, or perfumes, or deoderant.  Do not shave 48 hours prior to surgery.  Men may shave face and neck.  Do not bring valuables to the hospital.  [facility] is not responsible for any belongings or valuables.  Contacts, dentures or bridgework may not be worn into surgery.  Leave your suitcase in the car.  After surgery it may be brought to your room.  For patients admitted to the hospital, discharge time will be determined by your treatment team.  Patients discharged the day of surgery will not be allowed to drive home.   Name and phone number of your driver:   family Special instructions:  Follow the diet and prep instructions given to you by Dr Roseanne Kaufman office.  Please read over the following fact sheets that you were given. Anesthesia Post-op Instructions and Care and Recovery After Surgery       Colonoscopy, Adult A colonoscopy is an exam to look at the entire large intestine. During the exam, a lubricated, bendable tube is inserted into the anus and then passed into the rectum, colon, and other parts of the large intestine. A colonoscopy is often done as a part of normal colorectal screening or in response to certain symptoms, such as anemia, persistent diarrhea, abdominal pain, and blood in the stool. The exam can help screen for and diagnose medical problems, including:  Tumors.  Polyps.  Inflammation.  Areas of bleeding.  Tell a health care provider about:  Any allergies you have.  All medicines you are  taking, including vitamins, herbs, eye drops, creams, and over-the-counter medicines.  Any problems you or family members have had with anesthetic medicines.  Any blood disorders you have.  Any surgeries you have had.  Any medical conditions you have.  Any problems you have had passing stool. What are the risks? Generally, this is a safe procedure. However, problems may occur, including:  Bleeding.  A tear in the intestine.  A reaction to medicines given during the exam.  Infection (rare).  What happens before the procedure? Eating and drinking restrictions Follow instructions from your health care provider about eating and drinking, which may include:  A few days before the procedure - follow a low-fiber diet. Avoid nuts, seeds, dried fruit, raw fruits, and vegetables.  1-3 days before the procedure - follow a clear liquid diet. Drink only clear liquids, such as clear broth or bouillon, black coffee or tea, clear juice, clear soft drinks or sports drinks, gelatin dessert, and popsicles. Avoid any liquids that contain red or purple dye.  On the day of the procedure - do not eat or drink anything during the 2 hours before the procedure, or within the time period that your health care provider recommends.  Bowel prep If you were prescribed an oral bowel prep to clean out your colon:  Take it as told by your health care provider. Starting the day before your procedure,  you will need to drink a large amount of medicated liquid. The liquid will cause you to have multiple loose stools until your stool is almost clear or light green.  If your skin or anus gets irritated from diarrhea, you may use these to relieve the irritation: ? Medicated wipes, such as adult wet wipes with aloe and vitamin E. ? A skin soothing-product like petroleum jelly.  If you vomit while drinking the bowel prep, take a break for up to 60 minutes and then begin the bowel prep again. If vomiting continues and  you cannot take the bowel prep without vomiting, call your health care provider.  General instructions  Ask your health care provider about changing or stopping your regular medicines. This is especially important if you are taking diabetes medicines or blood thinners.  Plan to have someone take you home from the hospital or clinic. What happens during the procedure?  An IV tube may be inserted into one of your veins.  You will be given medicine to help you relax (sedative).  To reduce your risk of infection: ? Your health care team will wash or sanitize their hands. ? Your anal area will be washed with soap.  You will be asked to lie on your side with your knees bent.  Your health care provider will lubricate a long, thin, flexible tube. The tube will have a camera and a light on the end.  The tube will be inserted into your anus.  The tube will be gently eased through your rectum and colon.  Air will be delivered into your colon to keep it open. You may feel some pressure or cramping.  The camera will be used to take images during the procedure.  A small tissue sample may be removed from your body to be examined under a microscope (biopsy). If any potential problems are found, the tissue will be sent to a lab for testing.  If small polyps are found, your health care provider may remove them and have them checked for cancer cells.  The tube that was inserted into your anus will be slowly removed. The procedure may vary among health care providers and hospitals. What happens after the procedure?  Your blood pressure, heart rate, breathing rate, and blood oxygen level will be monitored until the medicines you were given have worn off.  Do not drive for 24 hours after the exam.  You may have a small amount of blood in your stool.  You may pass gas and have mild abdominal cramping or bloating due to the air that was used to inflate your colon during the exam.  It is up to  you to get the results of your procedure. Ask your health care provider, or the department performing the procedure, when your results will be ready. This information is not intended to replace advice given to you by your health care provider. Make sure you discuss any questions you have with your health care provider. Document Released: 05/18/2000 Document Revised: 03/21/2016 Document Reviewed: 08/02/2015 Elsevier Interactive Patient Education  2018 [employer].  Colonoscopy, Adult, Care After This sheet gives you information about how to care for yourself after your procedure. Your health care provider may also give you more specific instructions. If you have problems or questions, contact your health care provider. What can I expect after the procedure? After the procedure, it is common to have:  A small amount of blood in your stool for 24 hours after the procedure.  Some gas.  Mild abdominal cramping or bloating.  Follow these instructions at home: General instructions   For the first 24 hours after the procedure: ? Do not drive or use machinery. ? Do not sign important documents. ? Do not drink alcohol. ? Do your regular daily activities at a slower pace than normal. ? Eat soft, easy-to-digest foods. ? Rest often.  Take over-the-counter or prescription medicines only as told by your health care provider.  It is up to you to get the results of your procedure. Ask your health care provider, or the department performing the procedure, when your results will be ready. Relieving cramping and bloating  Try walking around when you have cramps or feel bloated.  Apply heat to your abdomen as told by your health care provider. Use a heat source that your health care provider recommends, such as a moist heat pack or a heating pad. ? Place a towel between your skin and the heat source. ? Leave the heat on for 20-30 minutes. ? Remove the heat if your skin turns bright red. This is  especially important if you are unable to feel pain, heat, or cold. You may have a greater risk of getting burned. Eating and drinking  Drink enough fluid to keep your urine clear or pale yellow.  Resume your normal diet as instructed by your health care provider. Avoid heavy or fried foods that are hard to digest.  Avoid drinking alcohol for as long as instructed by your health care provider. Contact a health care provider if:  You have blood in your stool 2-3 days after the procedure. Get help right away if:  You have more than a small spotting of blood in your stool.  You pass large blood clots in your stool.  Your abdomen is swollen.  You have nausea or vomiting.  You have a fever.  You have increasing abdominal pain that is not relieved with medicine. This information is not intended to replace advice given to you by your health care provider. Make sure you discuss any questions you have with your health care provider. Document Released: 01/03/2004 Document Revised: 02/13/2016 Document Reviewed: 08/02/2015 Elsevier Interactive Patient Education  2018 [place] Anesthesia is a term that refers to techniques, procedures, and medicines that help a person stay safe and comfortable during a medical procedure. Monitored anesthesia care, or sedation, is one type of anesthesia. Your anesthesia specialist may recommend sedation if you will be having a procedure that does not require you to be unconscious, such as:  Cataract surgery.  A dental procedure.  A biopsy.  A colonoscopy.  During the procedure, you may receive a medicine to help you relax (sedative). There are three levels of sedation:  Mild sedation. At this level, you may feel awake and relaxed. You will be able to follow directions.  Moderate sedation. At this level, you will be sleepy. You may not remember the procedure.  Deep sedation. At this level, you will be asleep. You will  not remember the procedure.  The more medicine you are given, the deeper your level of sedation will be. Depending on how you respond to the procedure, the anesthesia specialist may change your level of sedation or the type of anesthesia to fit your needs. An anesthesia specialist will monitor you closely during the procedure. Let your health care provider know about:  Any allergies you have.  All medicines you are taking, including vitamins, herbs, eye drops, creams, and  over-the-counter medicines.  Any use of steroids (by mouth or as a cream).  Any problems you or family members have had with sedatives and anesthetic medicines.  Any blood disorders you have.  Any surgeries you have had.  Any medical conditions you have, such as sleep apnea.  Whether you are pregnant or may be pregnant.  Any use of cigarettes, alcohol, or street drugs. What are the risks? Generally, this is a safe procedure. However, problems may occur, including:  Getting too much medicine (oversedation).  Nausea.  Allergic reaction to medicines.  Trouble breathing. If this happens, a breathing tube may be used to help with breathing. It will be removed when you are awake and breathing on your own.  Heart trouble.  Lung trouble.  Before the procedure Staying hydrated Follow instructions from your health care provider about hydration, which may include:  Up to 2 hours before the procedure - you may continue to drink clear liquids, such as water, clear fruit juice, black coffee, and plain tea.  Eating and drinking restrictions Follow instructions from your health care provider about eating and drinking, which may include:  8 hours before the procedure - stop eating heavy meals or foods such as meat, fried foods, or fatty foods.  6 hours before the procedure - stop eating light meals or foods, such as toast or cereal.  6 hours before the procedure - stop drinking milk or drinks that contain milk.  2  hours before the procedure - stop drinking clear liquids.  Medicines Ask your health care provider about:  Changing or stopping your regular medicines. This is especially important if you are taking diabetes medicines or blood thinners.  Taking medicines such as aspirin and ibuprofen. These medicines can thin your blood. Do not take these medicines before your procedure if your health care provider instructs you not to.  Tests and exams  You will have a physical exam.  You may have blood tests done to show: ? How well your kidneys and liver are working. ? How well your blood can clot.  General instructions  Plan to have someone take you home from the hospital or clinic.  If you will be going home right after the procedure, plan to have someone with you for 24 hours.  What happens during the procedure?  Your blood pressure, heart rate, breathing, level of pain and overall condition will be monitored.  An IV tube will be inserted into one of your veins.  Your anesthesia specialist will give you medicines as needed to keep you comfortable during the procedure. This may mean changing the level of sedation.  The procedure will be performed. After the procedure  Your blood pressure, heart rate, breathing rate, and blood oxygen level will be monitored until the medicines you were given have worn off.  Do not drive for 24 hours if you received a sedative.  You may: ? Feel sleepy, clumsy, or nauseous. ? Feel forgetful about what happened after the procedure. ? Have a sore throat if you had a breathing tube during the procedure. ? Vomit. This information is not intended to replace advice given to you by your health care provider. Make sure you discuss any questions you have with your health care provider. Document Released: 02/14/2005 Document Revised: 10/28/2015 Document Reviewed: 09/11/2015 Elsevier Interactive Patient Education  2018 [place],  Care After These instructions provide you with information about caring for yourself after your procedure. Your health care provider may also  give you more specific instructions. Your treatment has been planned according to current medical practices, but problems sometimes occur. Call your health care provider if you have any problems or questions after your procedure. What can I expect after the procedure? After your procedure, it is common to:  Feel sleepy for several hours.  Feel clumsy and have poor balance for several hours.  Feel forgetful about what happened after the procedure.  Have poor judgment for several hours.  Feel nauseous or vomit.  Have a sore throat if you had a breathing tube during the procedure.  Follow these instructions at home: For at least 24 hours after the procedure:   Do not: ? Participate in activities in which you could fall or become injured. ? Drive. ? Use heavy machinery. ? Drink alcohol. ? Take sleeping pills or medicines that cause drowsiness. ? Make important decisions or sign legal documents. ? Take care of children on your own.  Rest. Eating and drinking  Follow the diet that is recommended by your health care provider.  If you vomit, drink water, juice, or soup when you can drink without vomiting.  Make sure you have little or no nausea before eating solid foods. General instructions  Have a responsible adult stay with you until you are awake and alert.  Take over-the-counter and prescription medicines only as told by your health care provider.  If you smoke, do not smoke without supervision.  Keep all follow-up visits as told by your health care provider. This is important. Contact a health care provider if:  You keep feeling nauseous or you keep vomiting.  You feel light-headed.  You develop a rash.  You have a fever. Get help right away if:  You have trouble breathing. This information is not intended to replace  advice given to you by your health care provider. Make sure you discuss any questions you have with your health care provider. Document Released: 09/11/2015 Document Revised: 01/11/2016 Document Reviewed: 09/11/2015 Elsevier Interactive Patient Education  [employer].

## 2017-02-13 ENCOUNTER — Encounter (HOSPITAL_COMMUNITY)
Admission: RE | Admit: 2017-02-13 | Discharge: 2017-02-13 | Disposition: A | Discharge status: Home / Self Care | Payer: BLUE CROSS/BLUE SHIELD | Source: Ambulatory Visit | Attending: Internal Medicine | Admitting: Internal Medicine

## 2017-02-13 ENCOUNTER — Encounter (HOSPITAL_COMMUNITY): Payer: Self-pay

## 2017-02-13 DIAGNOSIS — G4733 Obstructive sleep apnea (adult) (pediatric): Secondary | ICD-10-CM | POA: Insufficient documentation

## 2017-02-13 DIAGNOSIS — K589 Irritable bowel syndrome without diarrhea: Secondary | ICD-10-CM | POA: Insufficient documentation

## 2017-02-13 DIAGNOSIS — Z01810 Encounter for preprocedural cardiovascular examination: Secondary | ICD-10-CM | POA: Insufficient documentation

## 2017-02-13 DIAGNOSIS — Z0000 Encounter for general adult medical examination without abnormal findings: Secondary | ICD-10-CM | POA: Diagnosis not present

## 2017-02-13 DIAGNOSIS — K219 Gastro-esophageal reflux disease without esophagitis: Secondary | ICD-10-CM | POA: Insufficient documentation

## 2017-02-13 DIAGNOSIS — K449 Diaphragmatic hernia without obstruction or gangrene: Secondary | ICD-10-CM | POA: Insufficient documentation

## 2017-02-13 DIAGNOSIS — Z862 Personal history of diseases of the blood and blood-forming organs and certain disorders involving the immune mechanism: Secondary | ICD-10-CM | POA: Diagnosis not present

## 2017-02-13 DIAGNOSIS — I444 Left anterior fascicular block: Secondary | ICD-10-CM | POA: Diagnosis not present

## 2017-02-13 DIAGNOSIS — K921 Melena: Secondary | ICD-10-CM | POA: Insufficient documentation

## 2017-02-13 DIAGNOSIS — N12 Tubulo-interstitial nephritis, not specified as acute or chronic: Secondary | ICD-10-CM | POA: Insufficient documentation

## 2017-02-13 DIAGNOSIS — Z6841 Body Mass Index (BMI) 40.0 and over, adult: Secondary | ICD-10-CM | POA: Diagnosis not present

## 2017-02-13 DIAGNOSIS — Z23 Encounter for immunization: Secondary | ICD-10-CM | POA: Diagnosis not present

## 2017-02-13 DIAGNOSIS — K469 Unspecified abdominal hernia without obstruction or gangrene: Secondary | ICD-10-CM | POA: Diagnosis not present

## 2017-02-13 DIAGNOSIS — Z1389 Encounter for screening for other disorder: Secondary | ICD-10-CM | POA: Diagnosis not present

## 2017-02-13 DIAGNOSIS — R1310 Dysphagia, unspecified: Secondary | ICD-10-CM | POA: Insufficient documentation

## 2017-02-13 DIAGNOSIS — Z800 Family history of malignant neoplasm of digestive organs: Secondary | ICD-10-CM | POA: Diagnosis not present

## 2017-02-13 DIAGNOSIS — K625 Hemorrhage of anus and rectum: Secondary | ICD-10-CM | POA: Diagnosis not present

## 2017-02-13 DIAGNOSIS — I10 Essential (primary) hypertension: Secondary | ICD-10-CM | POA: Diagnosis not present

## 2017-02-13 DIAGNOSIS — Z01812 Encounter for preprocedural laboratory examination: Secondary | ICD-10-CM | POA: Diagnosis not present

## 2017-02-13 HISTORY — DX: Unspecified asthma, uncomplicated: J45.909

## 2017-02-13 HISTORY — DX: Other complications of anesthesia, initial encounter: T88.59XA

## 2017-02-13 HISTORY — DX: Restless legs syndrome: G25.81

## 2017-02-13 HISTORY — DX: Adverse effect of unspecified anesthetic, initial encounter: T41.45XA

## 2017-02-13 HISTORY — DX: Personal history of urinary calculi: Z87.442

## 2017-02-13 HISTORY — DX: Type 2 diabetes mellitus without complications: E11.9

## 2017-02-13 LAB — CBC WITH DIFFERENTIAL/PLATELET
BASOS PCT: 1 %
Basophils Absolute: 0.1 10*3/uL (ref 0.0–0.1)
Eosinophils Absolute: 0.2 10*3/uL (ref 0.0–0.7)
Eosinophils Relative: 3 %
HEMATOCRIT: 42.2 % (ref 39.0–52.0)
Hemoglobin: 13.7 g/dL (ref 13.0–17.0)
Lymphocytes Relative: 27 %
Lymphs Abs: 2.3 10*3/uL (ref 0.7–4.0)
MCH: 27.1 pg (ref 26.0–34.0)
MCHC: 32.5 g/dL (ref 30.0–36.0)
MCV: 83.6 fL (ref 78.0–100.0)
Monocytes Absolute: 0.8 10*3/uL (ref 0.1–1.0)
Monocytes Relative: 9 %
NEUTROS PCT: 60 %
Neutro Abs: 5.2 10*3/uL (ref 1.7–7.7)
Platelets: 517 10*3/uL — ABNORMAL HIGH (ref 150–400)
RBC: 5.05 MIL/uL (ref 4.22–5.81)
RDW: 16.2 % — ABNORMAL HIGH (ref 11.5–15.5)
WBC: 8.5 10*3/uL (ref 4.0–10.5)

## 2017-02-13 LAB — BASIC METABOLIC PANEL
Anion gap: 10 (ref 5–15)
BUN: 11 mg/dL (ref 6–20)
CHLORIDE: 101 mmol/L (ref 101–111)
CO2: 25 mmol/L (ref 22–32)
CREATININE: 0.8 mg/dL (ref 0.61–1.24)
Calcium: 9.1 mg/dL (ref 8.9–10.3)
GFR calc non Af Amer: >60 mL/min (ref >60)
Glucose, Bld: 238 mg/dL — ABNORMAL HIGH (ref 65–99)
Potassium: 3.7 mmol/L (ref 3.5–5.1)
SODIUM: 136 mmol/L (ref 135–145)

## 2017-02-21 ENCOUNTER — Telehealth: Payer: Self-pay | Admitting: Internal Medicine

## 2017-02-21 NOTE — Telephone Encounter (Signed)
Pt to come by and pick up sample

## 2017-02-21 NOTE — Telephone Encounter (Signed)
Pt called to see if could get a moviprep sample for his procedure on Monday. Please call him back at [phone]

## 2017-02-25 ENCOUNTER — Ambulatory Visit (HOSPITAL_COMMUNITY): Payer: BLUE CROSS/BLUE SHIELD | Admitting: Anesthesiology

## 2017-02-25 ENCOUNTER — Ambulatory Visit (HOSPITAL_COMMUNITY)
Admission: RE | Admit: 2017-02-25 | Discharge: 2017-02-25 | Disposition: A | Discharge status: Home / Self Care | Payer: BLUE CROSS/BLUE SHIELD | Source: Ambulatory Visit | Attending: Internal Medicine | Admitting: Internal Medicine

## 2017-02-25 ENCOUNTER — Encounter (HOSPITAL_COMMUNITY)
Admission: RE | Disposition: A | Discharge status: Home / Self Care | Payer: Self-pay | Source: Ambulatory Visit | Attending: Internal Medicine

## 2017-02-25 ENCOUNTER — Encounter (HOSPITAL_COMMUNITY): Payer: Self-pay | Admitting: *Deleted

## 2017-02-25 DIAGNOSIS — K635 Polyp of colon: Secondary | ICD-10-CM | POA: Diagnosis not present

## 2017-02-25 DIAGNOSIS — I10 Essential (primary) hypertension: Secondary | ICD-10-CM | POA: Insufficient documentation

## 2017-02-25 DIAGNOSIS — K921 Melena: Secondary | ICD-10-CM | POA: Diagnosis not present

## 2017-02-25 DIAGNOSIS — Z800 Family history of malignant neoplasm of digestive organs: Secondary | ICD-10-CM | POA: Insufficient documentation

## 2017-02-25 DIAGNOSIS — Z7982 Long term (current) use of aspirin: Secondary | ICD-10-CM | POA: Insufficient documentation

## 2017-02-25 DIAGNOSIS — E785 Hyperlipidemia, unspecified: Secondary | ICD-10-CM | POA: Insufficient documentation

## 2017-02-25 DIAGNOSIS — D122 Benign neoplasm of ascending colon: Secondary | ICD-10-CM | POA: Insufficient documentation

## 2017-02-25 DIAGNOSIS — K589 Irritable bowel syndrome without diarrhea: Secondary | ICD-10-CM | POA: Diagnosis not present

## 2017-02-25 DIAGNOSIS — Z79899 Other long term (current) drug therapy: Secondary | ICD-10-CM | POA: Insufficient documentation

## 2017-02-25 DIAGNOSIS — K643 Fourth degree hemorrhoids: Secondary | ICD-10-CM | POA: Diagnosis not present

## 2017-02-25 DIAGNOSIS — K429 Umbilical hernia without obstruction or gangrene: Secondary | ICD-10-CM | POA: Diagnosis not present

## 2017-02-25 DIAGNOSIS — K219 Gastro-esophageal reflux disease without esophagitis: Secondary | ICD-10-CM | POA: Insufficient documentation

## 2017-02-25 DIAGNOSIS — G4730 Sleep apnea, unspecified: Secondary | ICD-10-CM | POA: Diagnosis not present

## 2017-02-25 DIAGNOSIS — Z888 Allergy status to other drugs, medicaments and biological substances status: Secondary | ICD-10-CM | POA: Diagnosis not present

## 2017-02-25 DIAGNOSIS — Z86010 Personal history of colonic polyps: Secondary | ICD-10-CM

## 2017-02-25 DIAGNOSIS — Z7984 Long term (current) use of oral hypoglycemic drugs: Secondary | ICD-10-CM | POA: Insufficient documentation

## 2017-02-25 DIAGNOSIS — Z87891 Personal history of nicotine dependence: Secondary | ICD-10-CM | POA: Diagnosis not present

## 2017-02-25 DIAGNOSIS — E039 Hypothyroidism, unspecified: Secondary | ICD-10-CM | POA: Insufficient documentation

## 2017-02-25 DIAGNOSIS — K625 Hemorrhage of anus and rectum: Secondary | ICD-10-CM | POA: Diagnosis not present

## 2017-02-25 HISTORY — PX: COLONOSCOPY WITH PROPOFOL: SHX5780

## 2017-02-25 HISTORY — PX: POLYPECTOMY: SHX5525

## 2017-02-25 LAB — GLUCOSE, CAPILLARY: GLUCOSE-CAPILLARY: 188 mg/dL — AB (ref 65–99)

## 2017-02-25 SURGERY — COLONOSCOPY WITH PROPOFOL
Anesthesia: Monitor Anesthesia Care

## 2017-02-25 MED ORDER — MIDAZOLAM HCL 5 MG/5ML IJ SOLN
INTRAMUSCULAR | Status: DC | PRN
  Administered 2017-02-25: 2 mg via INTRAVENOUS

## 2017-02-25 MED ORDER — MIDAZOLAM HCL 2 MG/2ML IJ SOLN
1.0000 mg | INTRAMUSCULAR | Status: AC
  Administered 2017-02-25: 2 mg via INTRAVENOUS

## 2017-02-25 MED ORDER — ONDANSETRON HCL 4 MG/2ML IJ SOLN
INTRAMUSCULAR | Status: AC
  Filled 2017-02-25: qty 2

## 2017-02-25 MED ORDER — GLYCOPYRROLATE 0.2 MG/ML IJ SOLN
0.2000 mg | Freq: Once | INTRAMUSCULAR | Status: AC | PRN
  Administered 2017-02-25: 0.2 mg via INTRAVENOUS

## 2017-02-25 MED ORDER — ONDANSETRON HCL 4 MG/2ML IJ SOLN
4.0000 mg | Freq: Once | INTRAMUSCULAR | Status: AC
  Administered 2017-02-25: 4 mg via INTRAVENOUS

## 2017-02-25 MED ORDER — CHLORHEXIDINE GLUCONATE CLOTH 2 % EX PADS: 6.0000 | MEDICATED_PAD | Freq: Once | CUTANEOUS | Status: DC

## 2017-02-25 MED ORDER — MIDAZOLAM HCL 2 MG/2ML IJ SOLN
INTRAMUSCULAR | Status: AC
  Filled 2017-02-25: qty 2

## 2017-02-25 MED ORDER — GLYCOPYRROLATE 0.2 MG/ML IJ SOLN
INTRAMUSCULAR | Status: AC
  Filled 2017-02-25: qty 1

## 2017-02-25 MED ORDER — PROPOFOL 10 MG/ML IV BOLUS
INTRAVENOUS | Status: AC
  Filled 2017-02-25: qty 40

## 2017-02-25 MED ORDER — FENTANYL CITRATE (PF) 100 MCG/2ML IJ SOLN
INTRAMUSCULAR | Status: AC
  Filled 2017-02-25: qty 2

## 2017-02-25 MED ORDER — PROPOFOL 500 MG/50ML IV EMUL
INTRAVENOUS | Status: DC | PRN
  Administered 2017-02-25: 125 ug/kg/min via INTRAVENOUS

## 2017-02-25 MED ORDER — LACTATED RINGERS IV SOLN
INTRAVENOUS | Status: DC
  Administered 2017-02-25: 08:00:00 via INTRAVENOUS

## 2017-02-25 MED ORDER — FENTANYL CITRATE (PF) 100 MCG/2ML IJ SOLN
25.0000 ug | Freq: Once | INTRAMUSCULAR | Status: AC
  Administered 2017-02-25: 25 ug via INTRAVENOUS

## 2017-02-25 NOTE — Transfer of Care (Signed)
Immediate Anesthesia Transfer of Care Note  Patient: [name] [name]  Procedure(s) Performed: Procedure(s) with comments: COLONOSCOPY WITH PROPOFOL (N/A) - 8:30am POLYPECTOMY - ASCENDING COLON  Patient Location: PACU  Anesthesia Type:MAC  Level of Consciousness: awake and patient cooperative  Airway & Oxygen Therapy: Patient Spontanous Breathing and Patient connected to face mask oxygen  Post-op Assessment: Report given to RN, Post -op Vital signs reviewed and stable and Patient moving all extremities  Post vital signs: Reviewed and stable  Last Vitals:  Vitals:   02/25/17 0750 02/25/17 0800  BP: (!) 142/78 (!) 145/87  Resp: 17 (!) 28  Temp:    SpO2: 94% 95%    Last Pain:  Vitals:   02/25/17 0728  TempSrc: Oral      Patients Stated Pain Goal: 8 (68/12/75 1700)  Complications: No apparent anesthesia complications

## 2017-02-25 NOTE — Discharge Instructions (Signed)
Colonoscopy Discharge Instructions  Read the instructions outlined below and refer to this sheet in the next few weeks. These discharge instructions provide you with general information on caring for yourself after you leave the hospital. Your doctor may also give you specific instructions. While your treatment has been planned according to the most current medical practices available, unavoidable complications occasionally occur. If you have any problems or questions after discharge, call Dr. Gala Romney at [phone]. ACTIVITY  You may resume your regular activity, but move at a slower pace for the next 24 hours.   Take frequent rest periods for the next 24 hours.   Walking will help get rid of the air and reduce the bloated feeling in your belly (abdomen).   No driving for 24 hours (because of the medicine (anesthesia) used during the test).    Do not sign any important legal documents or operate any machinery for 24 hours (because of the anesthesia used during the test).  NUTRITION  Drink plenty of fluids.   You may resume your normal diet as instructed by your doctor.   Begin with a light meal and progress to your normal diet. Heavy or fried foods are harder to digest and may make you feel sick to your stomach (nauseated).   Avoid alcoholic beverages for 24 hours or as instructed.  MEDICATIONS  You may resume your normal medications unless your doctor tells you otherwise.  WHAT YOU CAN EXPECT TODAY  Some feelings of bloating in the abdomen.   Passage of more gas than usual.   Spotting of blood in your stool or on the toilet paper.  IF YOU HAD POLYPS REMOVED DURING THE COLONOSCOPY:  No aspirin products for 7 days or as instructed.   No alcohol for 7 days or as instructed.   Eat a soft diet for the next 24 hours.  FINDING OUT THE RESULTS OF YOUR TEST Not all test results are available during your visit. If your test results are not back during the visit, make an appointment  with your caregiver to find out the results. Do not assume everything is normal if you have not heard from your caregiver or the medical facility. It is important for you to follow up on all of your test results.  SEEK IMMEDIATE MEDICAL ATTENTION IF:  You have more than a spotting of blood in your stool.   Your belly is swollen (abdominal distention).   You are nauseated or vomiting.   You have a temperature over 101.   You have abdominal pain or discomfort that is severe or gets worse throughout the day.   Colon polyp information provided  See Dr. Arnoldo Morale regarding repair of umbilical hernia  You should also see Dr. Arnoldo Morale regarding your large prolapsed hemorrhoids. They are too large to band  Further recommendations to follow pending review of pathology report   Colon Polyps Polyps are tissue growths inside the body. Polyps can grow in many places, including the large intestine (colon). A polyp may be a round bump or a mushroom-shaped growth. You could have one polyp or several. Most colon polyps are noncancerous (benign). However, some colon polyps can become cancerous over time. What are the causes? The exact cause of colon polyps is not known. What increases the risk? This condition is more likely to develop in people who:  Have a family history of colon cancer or colon polyps.  Are older than 98 or older than 45 if they are African American.  Have inflammatory  bowel disease, such as ulcerative colitis or Crohn disease.  Are overweight.  Smoke cigarettes.  Do not get enough exercise.  Drink too much alcohol.  Eat a diet that is: ? High in fat and red meat. ? Low in fiber.  Had childhood cancer that was treated with abdominal radiation.  What are the signs or symptoms? Most polyps do not cause symptoms. If you have symptoms, they may include:  Blood coming from your rectum when having a bowel movement.  Blood in your stool.The stool may look dark red or  black.  A change in bowel habits, such as constipation or diarrhea.  How is this diagnosed? This condition is diagnosed with a colonoscopy. This is a procedure that uses a lighted, flexible scope to look at the inside of your colon. How is this treated? Treatment for this condition involves removing any polyps that are found. Those polyps will then be tested for cancer. If cancer is found, your health care provider will talk to you about options for colon cancer treatment. Follow these instructions at home: Diet  Eat plenty of fiber, such as fruits, vegetables, and whole grains.  Eat foods that are high in calcium and vitamin D, such as milk, cheese, yogurt, eggs, liver, fish, and broccoli.  Limit foods high in fat, red meats, and processed meats, such as hot dogs, sausage, bacon, and lunch meats.  Maintain a healthy weight, or lose weight if recommended by your health care provider. General instructions  Do not smoke cigarettes.  Do not drink alcohol excessively.  Keep all follow-up visits as told by your health care provider. This is important. This includes keeping regularly scheduled colonoscopies. Talk to your health care provider about when you need a colonoscopy.  Exercise every day or as told by your health care provider. Contact a health care provider if:  You have new or worsening bleeding during a bowel movement.  You have new or increased blood in your stool.  You have a change in bowel habits.  You unexpectedly lose weight. This information is not intended to replace advice given to you by your health care provider. Make sure you discuss any questions you have with your health care provider. Document Released: 02/15/2004 Document Revised: 10/27/2015 Document Reviewed: 04/11/2015 Elsevier Interactive Patient Education  [employer].

## 2017-02-25 NOTE — H&P (View-Only) (Signed)
Primary Care Physician:  Sharilyn Sites, MD  Primary Gastroenterologist:  Garfield Cornea, MD   Chief Complaint  Patient presents with  . Rectal Bleeding    HPI:  [name] [name] is a 51 y.o. male here To schedule three-year surveillance colonoscopy. He had multiple tubular adenomas removed at time of his last colonoscopy in May 2015. Father succumbed to colon cancer at age 65. Patient had a flexible sigmoidoscopy with band ligation of grade 3 hemorrhoids in June 2015.  Patient states he developed a hernia above the umbilicus the last several months. He saw his PCP who recommended simply monitoring for now. Since then however he had more discomfort almost on a daily basis. He would like to consider having his hernia repaired in the near future. He like to have his colonoscopy prior to this.  Continues to have postprandial urgency with meals. Noted since he has gallbladder removed. Generally has a couple bowel movements daily. Rarely has to strain. No nocturnal diarrhea. Has had some recurrent rectal bleeding, sometimes passes clots. Reflux is well-controlled. No dysphagia or vomiting.  Now on metformin 1000 mg twice a day. Hemoglobin A1c had jumped up to 9 but now has improved since bone metformin per patient.   Patient states he woke up during his last colonoscopy.   Current Outpatient Prescriptions  Medication Sig Dispense Refill  . amitriptyline (ELAVIL) 25 MG tablet Take 25 mg by mouth at bedtime.    Marland Kitchen aspirin EC 81 MG tablet Take 81 mg by mouth daily.    . DULoxetine (CYMBALTA) 60 MG capsule Take 60 mg by mouth daily.    . hydrocortisone (CORTEF) 20 MG tablet Take 0.5 tablets (10 mg total) by mouth daily. For cortisol replacement 15 tablet 0  . levothyroxine (SYNTHROID, LEVOTHROID) 100 MCG tablet Take 125 mcg by mouth daily before breakfast.     . losartan (COZAAR) 100 MG tablet Take 100 mg by mouth daily.    . metformin (FORTAMET) 1000 MG (OSM) 24 hr tablet Take 1,000 mg by mouth 2 (two)  times daily with a meal.    . Multiple Vitamins-Minerals (MENS MULTIVITAMIN PLUS) TABS Take 1 tablet by mouth daily. /For nutritional supplementation. 30 tablet 0  . Omega-3 Fatty Acids (OMEGA-3 FISH OIL PO) Take by mouth.    Marland Kitchen omeprazole (PRILOSEC) 40 MG capsule Take 1 capsule (40 mg total) by mouth daily. 30 capsule 5  . simvastatin (ZOCOR) 20 MG tablet Take 1 tablet (20 mg total) by mouth at bedtime. To help lower cholesterol. 30 tablet 0  . sitaGLIPtin (JANUVIA) 100 MG tablet Take 100 mg by mouth daily.     No current facility-administered medications for this visit.     Allergies as of 02/07/2017 - Review Complete 02/07/2017  Allergen Reaction Noted  . Hydromorphone hcl Other (See Comments)     Past Medical History:  Diagnosis Date  . Back pain, chronic 02/18/11   BULGING DISK  . GERD (gastroesophageal reflux disease)    erosive reflux esophagitis, small HH  . Hemorrhoids   . Hyperlipemia   . Hypertension   . Hypothyroidism   . IBS (irritable bowel syndrome)   . Low serum cortisol level (HCC)    Dr Suzette Battiest  . Lower extremity edema   . Nephrolithiasis 12/19/2012  . Plantar fasciitis   . S/P colonoscopy 08/2008   int/ext hemorrhoids. friable, rectal polyp  . S/P endoscopy 01/10/09   Dr Vivi Ferns, small HH  . Sleep apnea    cpap  . Spinal  stenosis of lumbar region 12/18/2012  . Thyroid disease   . Tubular adenoma of colon 2000  . UTI (lower urinary tract infection)     Past Surgical History:  Procedure Laterality Date  . CHOLECYSTECTOMY  2006  . COLONOSCOPY  2000   Dr. Gala Romney- tubular adenoma  . COLONOSCOPY  08/10/2008   Dr. Gala Romney-  IMPRESSION:  Prominent external anal canal hemorrhoids, hyperplastic polyp  . COLONOSCOPY N/A 10/08/2013   CVE:LFYBOFBP colonic polyps -  removed as described above/Prominent grade 3 hemorrhoids -  likely source of hematochezia. tubular adenomas. next TCS 10/2016  . DISTAL BICEPS TENDON REPAIR  02/20/2012   Procedure: DISTAL BICEPS TENDON  REPAIR;  Surgeon: Lorn Junes, MD;  Location: [facility];  Service: Orthopedics;  Laterality: Left;  left elbow distal biceps tendon repair  . ESOPHAGOGASTRODUODENOSCOPY  2010   Dr. Gala Romney: normal  . FLEXIBLE SIGMOIDOSCOPY N/A 11/10/2013   Dr.Rourk- internal/external hemorrhoids. external hemorrhoids grade 3 with some residual prolapsing tissue. normal rectum aside form hemorrhoids. s/p band placement.  Marland Kitchen HEMORRHOID BANDING N/A 11/10/2013   Procedure: HEMORRHOID BANDING;  Surgeon: Daneil Dolin, MD;  Location: [facility];  Service: Endoscopy;  Laterality: N/A;  . KNEE SURGERY     left  . SPLENECTOMY  2003   itp    Family History  Problem Relation Age of Onset  . Colon cancer Father 5       deceased 6 months after diagnosis  . Diabetes Brother     Social History   Social History  . Marital status: Married    Spouse name: N/A  . Number of children: 1  . Years of education: N/A   Occupational History  . truck Animal nutritionist   Social History Main Topics  . Smoking status: Former Smoker    Packs/day: 1.00    Years: 30.00    Types: Cigarettes    Quit date: 03/05/2007  . Smokeless tobacco: Never Used  . Alcohol use No     Comment: hx heavy etoh, quit 4 y ago  . Drug use: No     Comment: quit 4ys ago, chronic marijuana  . Sexual activity: Yes   Other Topics Concern  . Not on file   Social History Narrative   1 healthy son      ROS:  General: Negative for anorexia, weight loss, fever, chills, fatigue, weakness. Eyes: Negative for vision changes.  ENT: Negative for hoarseness, difficulty swallowing , nasal congestion. CV: Negative for chest pain, angina, palpitations, dyspnea on exertion, peripheral edema.  Respiratory: Negative for dyspnea at rest, dyspnea on exertion, cough, sputum, wheezing.  GI: See history of present illness. GU:  Negative for dysuria, hematuria, urinary incontinence, urinary frequency, nocturnal urination.  MS:  Negative for joint pain, Positive low back pain.  Derm: Negative for rash or itching.  Neuro: Negative for weakness, abnormal sensation, seizure, frequent headaches, memory loss, confusion.  Psych: Negative for anxiety, depression, suicidal ideation, hallucinations.  Endo: Negative for unusual weight change.  Heme: Negative for bruising or bleeding. Allergy: Negative for rash or hives.    Physical Examination:  BP (!) 150/89   Pulse 83   Temp (!) 97.2 F (36.2 C) (Oral)   Ht 5\' 7"  (1.702 m)   Wt (!) 327 lb (148.3 kg)   BMI 51.22 kg/m    General: Well-nourished, well-developed in no acute distress.  Head: Normocephalic, atraumatic.   Eyes: Conjunctiva pink, no icterus. Mouth: Oropharyngeal mucosa moist and  pink , no lesions erythema or exudate. Neck: Supple without thyromegaly, masses, or lymphadenopathy.  Lungs: Clear to auscultation bilaterally.  Heart: Regular rate and rhythm, no murmurs rubs or gallops.  Abdomen: Bowel sounds are normal,nondistended but obese, no hepatosplenomegaly or masses, no abdominal bruits, no rebound or guarding.  Small bowel was noted above the umbilicus slightly tender, easily reducible Rectal: not performed Extremities: 1+ lower extremity edema. No clubbing or deformities.  Neuro: Alert and oriented x 4 , grossly normal neurologically.  Skin: Warm and dry, no rash or jaundice.   Psych: Alert and cooperative, normal mood and affect.    Imaging Studies: No results found.

## 2017-02-25 NOTE — Anesthesia Postprocedure Evaluation (Signed)
Anesthesia Post Note  Patient: [name] [name]  Procedure(s) Performed: Procedure(s) (LRB): COLONOSCOPY WITH PROPOFOL (N/A) POLYPECTOMY - ASCENDING COLON  Patient location during evaluation: PACU Anesthesia Type: MAC Level of consciousness: awake and alert and patient cooperative Pain management: pain level controlled Vital Signs Assessment: post-procedure vital signs reviewed and stable Respiratory status: spontaneous breathing, nonlabored ventilation and respiratory function stable Cardiovascular status: blood pressure returned to baseline Postop Assessment: no apparent nausea or vomiting Anesthetic complications: no     Last Vitals:  Vitals:   02/25/17 0857 02/25/17 0900  BP:  125/62  Pulse: 81 87  Resp:  18  Temp: 36.8 C   SpO2: 96% 93%    Last Pain:  Vitals:   02/25/17 0728  TempSrc: Oral                 Ollis Daudelin J

## 2017-02-25 NOTE — Interval H&P Note (Signed)
History and Physical Interval Note:  02/25/2017 7:31 AM  [name] [name]  has presented today for surgery, with the diagnosis of rectal bleeding, history of colon polyps, family history of colon cancer  The various methods of treatment have been discussed with the patient and family. After consideration of risks, benefits and other options for treatment, the patient has consented to  Procedure(s) with comments: COLONOSCOPY WITH PROPOFOL (N/A) - 8:30am as a surgical intervention .  The patient's history has been reviewed, patient examined, no change in status, stable for surgery.  I have reviewed the patient's chart and labs.  Questions were answered to the patient's satisfaction.     [name] [name]  No change. Patient with a symptomatic umbilical hernia. Wishes to have it repaired up colonoscopy. Will see Dr. Arnoldo Morale. Surveillance colonoscopy today per plan. The risks, benefits, limitations, alternatives and imponderables have been reviewed with the patient. Questions have been answered. All parties are agreeable.

## 2017-02-25 NOTE — Anesthesia Preprocedure Evaluation (Signed)
Anesthesia Evaluation  Patient identified by MRN, date of birth, ID band Patient awake    Reviewed: Allergy & Precautions, H&P , NPO status , Patient's Chart, lab work & pertinent test results  History of Anesthesia Complications (+) AWARENESS UNDER ANESTHESIA and history of anesthetic complications  Airway Mallampati: II  TM Distance: >3 FB Neck ROM: Full    Dental  (+) Teeth Intact   Pulmonary asthma , sleep apnea and Continuous Positive Airway Pressure Ventilation , former smoker,    breath sounds clear to auscultation       Cardiovascular hypertension, Pt. on medications  Rhythm:Regular Rate:Normal     Neuro/Psych PSYCHIATRIC DISORDERS Depression  Neuromuscular disease    GI/Hepatic GERD  ,  Endo/Other  diabetes, Type 2Hypothyroidism Morbid obesity  Renal/GU      Musculoskeletal   Abdominal (+) + obese,   Peds  Hematology   Anesthesia Other Findings   Reproductive/Obstetrics                            Anesthesia Physical Anesthesia Plan  ASA: III  Anesthesia Plan: MAC   Post-op Pain Management:    Induction: Intravenous  PONV Risk Score and Plan:   Airway Management Planned: Simple Face Mask  Additional Equipment:   Intra-op Plan:   Post-operative Plan:   Informed Consent: I have reviewed the patients History and Physical, chart, labs and discussed the procedure including the risks, benefits and alternatives for the proposed anesthesia with the patient or authorized representative who has indicated his/her understanding and acceptance.     Plan Discussed with:   Anesthesia Plan Comments:         Anesthesia Quick Evaluation

## 2017-02-25 NOTE — Op Note (Signed)
[facility] Patient Name: [name] [name] Procedure Date: 02/25/2017 8:01 AM MRN: [id number] Date of Birth: February 06, 1966 Attending MD: Norvel Richards , MD CSN: [id number] Age: 51 Admit Type: Outpatient Procedure:                Colonoscopy Indications:              Hematochezia Providers:                Norvel Richards, MD, Lurline Del, RN, Aram Candela Referring MD:              Medicines:                Propofol per Anesthesia Complications:            No immediate complications. Estimated Blood Loss:     Estimated blood loss was minimal. Procedure:                Pre-Anesthesia Assessment:                           - Prior to the procedure, a History and Physical                            was performed, and patient medications and                            allergies were reviewed. The patient's tolerance of                            previous anesthesia was also reviewed. The risks                            and benefits of the procedure and the sedation                            options and risks were discussed with the patient.                            All questions were answered, and informed consent                            was obtained. Prior Anticoagulants: The patient has                            taken no previous anticoagulant or antiplatelet                            agents. ASA Grade Assessment: II - A patient with                            mild systemic disease. After reviewing the risks                            and  benefits, the patient was deemed in                            satisfactory condition to undergo the procedure.                           After obtaining informed consent, the colonoscope                            was passed under direct vision. Throughout the                            procedure, the patient's blood pressure, pulse, and                            oxygen saturations were monitored  continuously. The                            EC-[id number] ([id number]) scope was introduced through                            the and advanced to the the cecum, identified by                            appendiceal orifice and ileocecal valve. The                            ileocecal valve, appendiceal orifice, and rectum                            were photographed. The ileocecal valve, appendiceal                            orifice, and rectum were photographed. The entire                            colon was well visualized. The quality of the bowel                            preparation was adequate. Scope In: 8:36:50 AM Scope Out: 8:47:27 AM Scope Withdrawal Time: 0 hours 7 minutes 53 seconds  Total Procedure Duration: 0 hours 10 minutes 37 seconds  Findings:      The perianal exam findings include internal hemorrhoids that do not       return to the anal canal, thus continuously prolapsed (Grade IV).       Hemorrhoids are large.      A 6 mm polyp was found in the ascending colon. The polyp was       semi-pedunculated. The polyp was removed with a cold snare. Resection       and retrieval were complete. Estimated blood loss was minimal.      The exam was otherwise without abnormality on direct and retroflexion       views. Impression:               - Internal hemorrhoids that  do not return to the                            anal canal, thus continuously prolapsed (Grade IV)                            found on perianal exam.                           - One 6 mm polyp in the ascending colon, removed                            with a cold snare. Resected and retrieved.                           - The examination was otherwise normal on direct                            and retroflexion views. Grade 4 hemorrhoids not                            amenable to banding. Patient has a symptomatic                            umbilical hernia; he desires surgical repair. Moderate Sedation:       Moderate (conscious) sedation was personally administered by an       anesthesia professional. The following parameters were monitored: oxygen       saturation, heart rate, blood pressure, respiratory rate, EKG, adequacy       of pulmonary ventilation, and response to care. Total physician       intraservice time was 20 minutes. Recommendation:           - Patient has a contact number available for                            emergencies. The signs and symptoms of potential                            delayed complications were discussed with the                            patient. Return to normal activities tomorrow.                            Written discharge instructions were provided to the                            patient.                           - Resume previous diet.                           - Continue present medications.                           -  Repeat colonoscopy date to be determined after                            pending pathology results are reviewed for                            surveillance based on pathology results.                           - Return to GI clinic (date not yet determined).                            Referral to Dr. Aviva Signs for both umbilical                            hernia repair and hemorrhoidctomy. Procedure Code(s):        --- Professional ---                           [phone], Colonoscopy, flexible; with removal of                            tumor(s), polyp(s), or other lesion(s) by snare                            technique Diagnosis Code(s):        --- Professional ---                           D12.2, Benign neoplasm of ascending colon                           K64.3, Fourth degree hemorrhoids                           K92.1, Melena (includes Hematochezia) CPT copyright 2016 American Medical Association. All rights reserved. The codes documented in this report are preliminary and upon coder review may  be revised to meet current  compliance requirements. Cristopher Estimable. Rourk, MD Norvel Richards, MD 02/25/2017 9:00:10 AM This report has been signed electronically. Number of Addenda: 0

## 2017-02-26 ENCOUNTER — Encounter: Payer: Self-pay | Admitting: Internal Medicine

## 2017-02-27 ENCOUNTER — Encounter (HOSPITAL_COMMUNITY): Payer: Self-pay | Admitting: Internal Medicine

## 2017-03-05 ENCOUNTER — Other Ambulatory Visit: Payer: Self-pay

## 2017-03-05 ENCOUNTER — Telehealth: Payer: Self-pay | Admitting: Internal Medicine

## 2017-03-05 DIAGNOSIS — K649 Unspecified hemorrhoids: Secondary | ICD-10-CM

## 2017-03-05 DIAGNOSIS — K429 Umbilical hernia without obstruction or gangrene: Secondary | ICD-10-CM

## 2017-03-05 NOTE — Telephone Encounter (Signed)
Reviewed Colonoscopy report. Per Dr. Roseanne Kaufman recommendation referral was sent to Dr. Arnoldo Morale via [facility]. Called and informed the pt that referral was sent and Dr. Arnoldo Morale office would contact him.

## 2017-03-05 NOTE — Telephone Encounter (Signed)
PATIENT STATED THAT [facility] TOLD HIM WE WOULD REFER HIM TO DR Arnoldo Morale DUE TO HIE HERNIA, WANTED TO KNOW WHAT WAS THE STATUS ON THAT BECAUSE HE WAS IN PAIN

## 2017-03-26 ENCOUNTER — Ambulatory Visit: Payer: Self-pay | Admitting: General Surgery

## 2017-04-02 ENCOUNTER — Encounter: Payer: Self-pay | Admitting: General Surgery

## 2017-04-02 ENCOUNTER — Ambulatory Visit (INDEPENDENT_AMBULATORY_CARE_PROVIDER_SITE_OTHER): Payer: BLUE CROSS/BLUE SHIELD | Admitting: General Surgery

## 2017-04-02 VITALS — BP 173/105 | HR 92 | Temp 97.3°F | Resp 18 | Ht 67.0 in | Wt 322.0 lb

## 2017-04-02 DIAGNOSIS — K432 Incisional hernia without obstruction or gangrene: Secondary | ICD-10-CM

## 2017-04-02 NOTE — Progress Notes (Signed)
[name] [name]; [id number]; 1966/03/11   HPI Patient is a 51 year old white male who is referred by care by Dr. Gala Romney for evaluation and treatment of a ventral hernia.  Patient states that he felt something pop just above his umbilicus while lifting something heavy in July of this year.  Ever since then, straining makes the pain worse and he feels a lump above his umbilicus.  It resolves when lying down.  He currently has a pain of 7 out of 10.  He denies any nausea or vomiting.  He is status post a laparoscopic cholecystectomy by myself in 2006.  He also states that he has a large hemorrhoids which protrude.  This was found on recent colonoscopy.  They occasionally bleed.  He has a truck Geophysicist/field seismologist by trade. Past Medical History:  Diagnosis Date  . Asthma    as child  . Back pain, chronic 02/18/11   BULGING DISK  . Complication of anesthesia    "woke up during spleenectomy at [facility]".  . Diabetes mellitus without complication ([place])   . GERD (gastroesophageal reflux disease)    erosive reflux esophagitis, small HH  . Hemorrhoids   . History of kidney stones   . Hyperlipemia   . Hypertension   . Hypothyroidism   . IBS (irritable bowel syndrome)   . Low serum cortisol level (HCC)    Dr Suzette Battiest  . Lower extremity edema   . Nephrolithiasis 12/19/2012  . Plantar fasciitis   . Restless leg syndrome   . S/P colonoscopy 08/2008   int/ext hemorrhoids. friable, rectal polyp  . S/P endoscopy 01/10/09   Dr Vivi Ferns, small HH  . Sleep apnea    cpap  . Spinal stenosis of lumbar region 12/18/2012  . Thyroid disease   . Tubular adenoma of colon 2000  . UTI (lower urinary tract infection)     Past Surgical History:  Procedure Laterality Date  . CHOLECYSTECTOMY  2006  . COLONOSCOPY  2000   Dr. Gala Romney- tubular adenoma  . COLONOSCOPY  08/10/2008   Dr. Gala Romney-  IMPRESSION:  Prominent external anal canal hemorrhoids, hyperplastic polyp  . COLONOSCOPY N/A 10/08/2013   [id number] colonic polyps -  removed  as described above/Prominent grade 3 hemorrhoids -  likely source of hematochezia. tubular adenomas. next TCS 10/2016  . COLONOSCOPY WITH PROPOFOL N/A 02/25/2017   Procedure: COLONOSCOPY WITH PROPOFOL;  Surgeon: Daneil Dolin, MD;  [facility];  Service: Endoscopy;  Laterality: N/A;  8:30am  . CYSTOSCOPY     with utetheral dilation  . DISTAL BICEPS TENDON REPAIR  02/20/2012   Procedure: DISTAL BICEPS TENDON REPAIR;  Surgeon: Lorn Junes, MD;  Location: [place];  Service: Orthopedics;  Laterality: Left;  left elbow distal biceps tendon repair  . ESOPHAGOGASTRODUODENOSCOPY  2010   Dr. Gala Romney: normal  . FLEXIBLE SIGMOIDOSCOPY N/A 11/10/2013   Dr.Rourk- internal/external hemorrhoids. external hemorrhoids grade 3 with some residual prolapsing tissue. normal rectum aside form hemorrhoids. s/p band placement.  Marland Kitchen HEMORRHOID BANDING N/A 11/10/2013   Procedure: HEMORRHOID BANDING;  Surgeon: Daneil Dolin, MD;  [facility];  Service: Endoscopy;  Laterality: N/A;  . KNEE SURGERY     left  . POLYPECTOMY  02/25/2017   Procedure: POLYPECTOMY - ASCENDING COLON;  Surgeon: Daneil Dolin, MD;  [facility];  Service: Endoscopy;;  . SPLENECTOMY  2003   itp    Family History  Problem Relation Age of Onset  . Colon cancer Father  52       deceased 6 months after diagnosis  . Diabetes Brother     Current Outpatient Prescriptions on File Prior to Visit  Medication Sig Dispense Refill  . amitriptyline (ELAVIL) 10 MG tablet Take 10 mg by mouth at bedtime.     Marland Kitchen aspirin EC 81 MG tablet Take 81 mg by mouth daily.    . DULoxetine (CYMBALTA) 60 MG capsule Take 120 mg by mouth daily.     . hydrocortisone (CORTEF) 20 MG tablet Take 0.5 tablets (10 mg total) by mouth daily. For cortisol replacement 15 tablet 0  . levothyroxine (SYNTHROID, LEVOTHROID) 100 MCG tablet Take 125 mcg by mouth daily before breakfast.     . lisinopril (PRINIVIL,ZESTRIL) 20 MG tablet Take  20 mg by mouth daily.    . metformin (FORTAMET) 1000 MG (OSM) 24 hr tablet Take 1,000 mg by mouth 2 (two) times daily with a meal.    . Multiple Vitamins-Minerals (MENS MULTIVITAMIN PLUS) TABS Take 1 tablet by mouth daily. /For nutritional supplementation. 30 tablet 0  . Omega-3 Fatty Acids (OMEGA-3 FISH OIL PO) Take by mouth.    Marland Kitchen omeprazole (PRILOSEC) 40 MG capsule Take 1 capsule (40 mg total) by mouth daily. 30 capsule 5  . polyethylene glycol-electrolytes (TRILYTE) 420 g solution Take 4,000 mLs by mouth as directed. 4000 mL 0  . simvastatin (ZOCOR) 20 MG tablet Take 1 tablet (20 mg total) by mouth at bedtime. To help lower cholesterol. (Patient taking differently: Take 40 mg by mouth at bedtime. To help lower cholesterol.) 30 tablet 0  . sitaGLIPtin (JANUVIA) 100 MG tablet Take 100 mg by mouth daily.    . tamsulosin (FLOMAX) 0.4 MG CAPS capsule Take 0.4 mg by mouth daily.     No current facility-administered medications on file prior to visit.     Allergies  Allergen Reactions  . Hydromorphone Hcl Other (See Comments)    Feels like chest will bust open     History  Alcohol Use No    Comment: 2008    History  Smoking Status  . Former Smoker  . Packs/day: 0.50  . Years: 30.00  . Types: Cigarettes  . Quit date: 03/05/2007  Smokeless Tobacco  . Never Used    Review of Systems  Constitutional: Negative.   HENT: Negative.   Eyes: Negative.   Respiratory: Negative.   Cardiovascular: Negative.   Gastrointestinal: Positive for abdominal pain.  Genitourinary: Positive for dysuria, frequency and urgency.  Musculoskeletal: Negative.   Skin: Negative.   Neurological: Negative.   Endo/Heme/Allergies: Negative.   Psychiatric/Behavioral: Negative.     Objective   Vitals:   04/02/17 1537  BP: (!) 173/105  Pulse: 92  Resp: 18  Temp: (!) 97.3 F (36.3 C)    Physical Exam  Constitutional: He is well-developed, well-nourished, and in no distress.  HENT:  Head:  Normocephalic and atraumatic.  Cardiovascular: Normal rate, regular rhythm and normal heart sounds.  Exam reveals no gallop and no friction rub.   No murmur heard. Pulmonary/Chest: Effort normal and breath sounds normal. No respiratory distress. He has no wheezes. He has no rales.  Abdominal: Soft. Bowel sounds are normal. He exhibits no distension. There is no tenderness. There is no rebound.  Reducible supraumbilical hernia below a surgical scar.  Vitals reviewed. Rectal examination reveals very large hemorrhoids almost circumferentially around the anus.  Sphincter tone was fair.  No active bleeding noted.  Assessment  Incisional hernia Circumferential hemorrhoidal disease Plan  I told patient that his hemorrhoidal disease would need to be evaluated by a colorectal specialist.  I will proceed with an incisional herniorrhaphy with mesh on 03/10/2017.  The risks and benefits of the procedure including bleeding, infection, and mesh use were fully explained to the patient, who gave informed consent.

## 2017-04-02 NOTE — Patient Instructions (Signed)
 Ventral Hernia A ventral hernia is a bulge of tissue from inside the abdomen that pushes through a weak area of the muscles that form the front wall of the abdomen. The tissues inside the abdomen are inside a sac (peritoneum). These tissues include the small intestine, large intestine, and the fatty tissue that covers the intestines (omentum). Sometimes, the bulge that forms a hernia contains intestines. Other hernias contain only fat. Ventral hernias do not go away without surgical treatment. There are several types of ventral hernias. You may have:  A hernia at an incision site from previous abdominal surgery (incisional hernia).  A hernia just above the belly button (epigastric hernia), or at the belly button (umbilical hernia). These types of hernias can develop from heavy lifting or straining.  A hernia that comes and goes (reducible hernia). It may be visible only when you lift or strain. This type of hernia can be pushed back into the abdomen (reduced).  A hernia that traps abdominal tissue inside the hernia (incarcerated hernia). This type of hernia does not reduce.  A hernia that cuts off blood flow to the tissues inside the hernia (strangulated hernia). The tissues can start to die if this happens. This is a very painful bulge that cannot be reduced. A strangulated hernia is a medical emergency.  What are the causes? This condition is caused by abdominal tissue putting pressure on an area of weakness in the abdominal muscles. What increases the risk? The following factors may make you more likely to develop this condition:  Being male.  Being 60 or older.  Being overweight or obese.  Having had previous abdominal surgery, especially if there was an infection after surgery.  Having had an injury to the abdominal wall.  Having had several pregnancies.  Having a buildup of fluid inside the abdomen (ascites).  What are the signs or symptoms? The only symptom of a ventral  hernia may be a painless bulge in the abdomen. A reducible hernia may be visible only when you strain, cough, or lift. Other symptoms may include:  Dull pain.  A feeling of pressure.  Signs and symptoms of a strangulated hernia may include:  Increasing pain.  Nausea and vomiting.  Pain when pressing on the hernia.  The skin over the hernia turning red or purple.  Constipation.  Blood in the stool (feces).  How is this diagnosed? This condition may be diagnosed based on:  Your symptoms.  Your medical history.  A physical exam. You may be asked to cough or strain while standing. These actions increase the pressure inside your abdomen and force the hernia through the opening in your muscles. Your health care provider may try to reduce the hernia by pressing on it.  Imaging studies, such as an ultrasound or CT scan.  How is this treated? This condition is treated with surgery. If you have a strangulated hernia, surgery is done as soon as possible. If your hernia is small and not incarcerated, you may be asked to lose some weight before surgery. Follow these instructions at home:  Follow instructions from your health care provider about eating or drinking restrictions.  If you are overweight, your health care provider may recommend that you increase your activity level and eat a healthier diet.  Do not lift anything that is heavier than 10 lb (4.5 kg).  Return to your normal activities as told by your health care provider. Ask your health care provider what activities are safe for you. You   may need to avoid activities that increase pressure on your hernia.  Take over-the-counter and prescription medicines only as told by your health care provider.  Keep all follow-up visits as told by your health care provider. This is important. Contact a health care provider if:  Your hernia gets larger.  Your hernia becomes painful. Get help right away if:  Your hernia becomes  increasingly painful.  You have pain along with any of the following: ? Changes in skin color in the area of the hernia. ? Nausea. ? Vomiting. ? Fever. Summary  A ventral hernia is a bulge of tissue from inside the abdomen that pushes through a weak area of the muscles that form the front wall of the abdomen.  This condition is treated with surgery, which may be urgent depending on your hernia.  Do not lift anything that is heavier than 10 lb (4.5 kg), and follow activity instructions from your health care provider. This information is not intended to replace advice given to you by your health care provider. Make sure you discuss any questions you have with your health care provider. Document Released: 05/07/2012 Document Revised: 01/06/2016 Document Reviewed: 01/06/2016 Elsevier Interactive Patient Education  2018 [employer] Inc[employer]  

## 2017-04-02 NOTE — H&P (Signed)
[name] [name]; [id number]; 1966/01/20   HPI Patient is a 51 year old white male who is referred by care by Dr. Gala Romney for evaluation and treatment of a ventral hernia.  Patient states that he felt something pop just above his umbilicus while lifting something heavy in July of this year.  Ever since then, straining makes the pain worse and he feels a lump above his umbilicus.  It resolves when lying down.  He currently has a pain of 7 out of 10.  He denies any nausea or vomiting.  He is status post a laparoscopic cholecystectomy by myself in 2006.  He also states that he has a large hemorrhoids which protrude.  This was found on recent colonoscopy.  They occasionally bleed.  He has a truck Geophysicist/field seismologist by trade. Past Medical History:  Diagnosis Date  . Asthma    as child  . Back pain, chronic 02/18/11   BULGING DISK  . Complication of anesthesia    "woke up during spleenectomy at [facility]".  . Diabetes mellitus without complication ([place])   . GERD (gastroesophageal reflux disease)    erosive reflux esophagitis, small HH  . Hemorrhoids   . History of kidney stones   . Hyperlipemia   . Hypertension   . Hypothyroidism   . IBS (irritable bowel syndrome)   . Low serum cortisol level (HCC)    Dr Suzette Battiest  . Lower extremity edema   . Nephrolithiasis 12/19/2012  . Plantar fasciitis   . Restless leg syndrome   . S/P colonoscopy 08/2008   int/ext hemorrhoids. friable, rectal polyp  . S/P endoscopy 01/10/09   Dr Vivi Ferns, small HH  . Sleep apnea    cpap  . Spinal stenosis of lumbar region 12/18/2012  . Thyroid disease   . Tubular adenoma of colon 2000  . UTI (lower urinary tract infection)     Past Surgical History:  Procedure Laterality Date  . CHOLECYSTECTOMY  2006  . COLONOSCOPY  2000   Dr. Gala Romney- tubular adenoma  . COLONOSCOPY  08/10/2008   Dr. Gala Romney-  IMPRESSION:  Prominent external anal canal hemorrhoids, hyperplastic polyp  . COLONOSCOPY N/A 10/08/2013   [id number] colonic polyps -  removed  as described above/Prominent grade 3 hemorrhoids -  likely source of hematochezia. tubular adenomas. next TCS 10/2016  . COLONOSCOPY WITH PROPOFOL N/A 02/25/2017   Procedure: COLONOSCOPY WITH PROPOFOL;  Surgeon: Daneil Dolin, MD;  [facility];  Service: Endoscopy;  Laterality: N/A;  8:30am  . CYSTOSCOPY     with utetheral dilation  . DISTAL BICEPS TENDON REPAIR  02/20/2012   Procedure: DISTAL BICEPS TENDON REPAIR;  Surgeon: Lorn Junes, MD;  Location: [place];  Service: Orthopedics;  Laterality: Left;  left elbow distal biceps tendon repair  . ESOPHAGOGASTRODUODENOSCOPY  2010   Dr. Gala Romney: normal  . FLEXIBLE SIGMOIDOSCOPY N/A 11/10/2013   Dr.Rourk- internal/external hemorrhoids. external hemorrhoids grade 3 with some residual prolapsing tissue. normal rectum aside form hemorrhoids. s/p band placement.  Marland Kitchen HEMORRHOID BANDING N/A 11/10/2013   Procedure: HEMORRHOID BANDING;  Surgeon: Daneil Dolin, MD;  [facility];  Service: Endoscopy;  Laterality: N/A;  . KNEE SURGERY     left  . POLYPECTOMY  02/25/2017   Procedure: POLYPECTOMY - ASCENDING COLON;  Surgeon: Daneil Dolin, MD;  [facility];  Service: Endoscopy;;  . SPLENECTOMY  2003   itp    Family History  Problem Relation Age of Onset  . Colon cancer Father  52       deceased 6 months after diagnosis  . Diabetes Brother     Current Outpatient Prescriptions on File Prior to Visit  Medication Sig Dispense Refill  . amitriptyline (ELAVIL) 10 MG tablet Take 10 mg by mouth at bedtime.     Marland Kitchen aspirin EC 81 MG tablet Take 81 mg by mouth daily.    . DULoxetine (CYMBALTA) 60 MG capsule Take 120 mg by mouth daily.     . hydrocortisone (CORTEF) 20 MG tablet Take 0.5 tablets (10 mg total) by mouth daily. For cortisol replacement 15 tablet 0  . levothyroxine (SYNTHROID, LEVOTHROID) 100 MCG tablet Take 125 mcg by mouth daily before breakfast.     . lisinopril (PRINIVIL,ZESTRIL) 20 MG tablet Take  20 mg by mouth daily.    . metformin (FORTAMET) 1000 MG (OSM) 24 hr tablet Take 1,000 mg by mouth 2 (two) times daily with a meal.    . Multiple Vitamins-Minerals (MENS MULTIVITAMIN PLUS) TABS Take 1 tablet by mouth daily. /For nutritional supplementation. 30 tablet 0  . Omega-3 Fatty Acids (OMEGA-3 FISH OIL PO) Take by mouth.    Marland Kitchen omeprazole (PRILOSEC) 40 MG capsule Take 1 capsule (40 mg total) by mouth daily. 30 capsule 5  . polyethylene glycol-electrolytes (TRILYTE) 420 g solution Take 4,000 mLs by mouth as directed. 4000 mL 0  . simvastatin (ZOCOR) 20 MG tablet Take 1 tablet (20 mg total) by mouth at bedtime. To help lower cholesterol. (Patient taking differently: Take 40 mg by mouth at bedtime. To help lower cholesterol.) 30 tablet 0  . sitaGLIPtin (JANUVIA) 100 MG tablet Take 100 mg by mouth daily.    . tamsulosin (FLOMAX) 0.4 MG CAPS capsule Take 0.4 mg by mouth daily.     No current facility-administered medications on file prior to visit.     Allergies  Allergen Reactions  . Hydromorphone Hcl Other (See Comments)    Feels like chest will bust open     History  Alcohol Use No    Comment: 2008    History  Smoking Status  . Former Smoker  . Packs/day: 0.50  . Years: 30.00  . Types: Cigarettes  . Quit date: 03/05/2007  Smokeless Tobacco  . Never Used    Review of Systems  Constitutional: Negative.   HENT: Negative.   Eyes: Negative.   Respiratory: Negative.   Cardiovascular: Negative.   Gastrointestinal: Positive for abdominal pain.  Genitourinary: Positive for dysuria, frequency and urgency.  Musculoskeletal: Negative.   Skin: Negative.   Neurological: Negative.   Endo/Heme/Allergies: Negative.   Psychiatric/Behavioral: Negative.     Objective   Vitals:   04/02/17 1537  BP: (!) 173/105  Pulse: 92  Resp: 18  Temp: (!) 97.3 F (36.3 C)    Physical Exam  Constitutional: He is well-developed, well-nourished, and in no distress.  HENT:  Head:  Normocephalic and atraumatic.  Cardiovascular: Normal rate, regular rhythm and normal heart sounds.  Exam reveals no gallop and no friction rub.   No murmur heard. Pulmonary/Chest: Effort normal and breath sounds normal. No respiratory distress. He has no wheezes. He has no rales.  Abdominal: Soft. Bowel sounds are normal. He exhibits no distension. There is no tenderness. There is no rebound.  Reducible supraumbilical hernia below a surgical scar.  Vitals reviewed. Rectal examination reveals very large hemorrhoids almost circumferentially around the anus.  Sphincter tone was fair.  No active bleeding noted.  Assessment  Incisional hernia Circumferential hemorrhoidal disease Plan  I told patient that his hemorrhoidal disease would need to be evaluated by a colorectal specialist.  I will proceed with an incisional herniorrhaphy with mesh on 03/10/2017.  The risks and benefits of the procedure including bleeding, infection, and mesh use were fully explained to the patient, who gave informed consent.

## 2017-04-05 NOTE — Patient Instructions (Signed)
[name] [name]  04/05/2017     @PREFPERIOPPHARMACY @   Your procedure is scheduled on  04/10/2017  Report to [facility] at  1125   A.M.  Call this number if you have problems the morning of surgery:  [phone]   Remember:  Do not eat food or drink liquids after midnight.  Take these medicines the morning of surgery with A SIP OF WATER  Cymbalta, levothyroxine, losartan, prilosec, flomax.   Do not wear jewelry, make-up or nail polish.  Do not wear lotions, powders, or perfumes, or deoderant.  Do not shave 48 hours prior to surgery.  Men may shave face and neck.  Do not bring valuables to the hospital.  [facility] is not responsible for any belongings or valuables.  Contacts, dentures or bridgework may not be worn into surgery.  Leave your suitcase in the car.  After surgery it may be brought to your room.  For patients admitted to the hospital, discharge time will be determined by your treatment team.  Patients discharged the day of surgery will not be allowed to drive home.   Name and phone number of your driver:   family Special instructions:  None  Please read over the following fact sheets that you were given. Anesthesia Post-op Instructions and Care and Recovery After Surgery       Open Hernia Repair, Adult Open hernia repair is a surgical procedure to fix a hernia. A hernia occurs when an internal organ or tissue pushes out through a weak spot in the abdominal wall muscles. Hernias commonly occur in the groin and around the navel. Most hernias tend to get worse over time. Often, surgery is done to prevent the hernia from becoming bigger, uncomfortable, or an emergency. Emergency surgery may be needed if abdominal contents get stuck in the opening (incarcerated hernia) or the blood supply gets cut off (strangulated hernia). In an open repair, an incision is made in the abdomen to perform the surgery. Tell a health care provider about:  Any allergies you  have.  All medicines you are taking, including vitamins, herbs, eye drops, creams, and over-the-counter medicines.  Any problems you or family members have had with anesthetic medicines.  Any blood or bone disorders you have.  Any surgeries you have had.  Any medical conditions you have, including any recent cold or flu symptoms.  Whether you are pregnant or may be pregnant. What are the risks? Generally, this is a safe procedure. However, problems may occur, including:  Long-lasting (chronic) pain.  Bleeding.  Infection.  Damage to the testicle. This can cause shrinking or swelling.  Damage to the bladder, blood vessels, intestine, or nerves near the hernia.  Trouble passing urine.  Allergic reactions to medicines.  Return of the hernia.  What happens before the procedure? Staying hydrated Follow instructions from your health care provider about hydration, which may include:  Up to 2 hours before the procedure - you may continue to drink clear liquids, such as water, clear fruit juice, black coffee, and plain tea.  Eating and drinking restrictions Follow instructions from your health care provider about eating and drinking, which may include:  8 hours before the procedure - stop eating heavy meals or foods such as meat, fried foods, or fatty foods.  6 hours before the procedure - stop eating light meals or foods, such as toast or cereal.  6 hours before the procedure - stop drinking milk  or drinks that contain milk.  2 hours before the procedure - stop drinking clear liquids.  Medicines  Ask your health care provider about: ? Changing or stopping your regular medicines. This is especially important if you are taking diabetes medicines or blood thinners. ? Taking medicines such as aspirin and ibuprofen. These medicines can thin your blood. Do not take these medicines before your procedure if your health care provider instructs you not to.  You may be given  antibiotic medicine to help prevent infection. General instructions  You may have blood tests or imaging studies.  Ask your health care provider how your surgical site will be marked or identified.  If you smoke, do not smoke for at least 2 weeks before your procedure or for as long as told by your health care provider.  Let your health care provider know if you develop a cold or any infection before your surgery.  Plan to have someone take you home from the hospital or clinic.  If you will be going home right after the procedure, plan to have someone with you for 24 hours. What happens during the procedure?  To reduce your risk of infection: ? Your health care team will wash or sanitize their hands. ? Your skin will be washed with soap. ? Hair may be removed from the surgical area.  An IV tube will be inserted into one of your veins.  You will be given one or more of the following: ? A medicine to help you relax (sedative). ? A medicine to numb the area (local anesthetic). ? A medicine to make you fall asleep (general anesthetic).  Your surgeon will make an incision over the hernia.  The tissues of the hernia will be moved back into place.  The edges of the hernia may be stitched together.  The opening in the abdominal muscles will be closed with stitches (sutures). Or, your surgeon will place a mesh patch made of manmade (synthetic) material over the opening.  The incision will be closed.  A bandage (dressing) may be placed over the incision. The procedure may vary among health care providers and hospitals. What happens after the procedure?  Your blood pressure, heart rate, breathing rate, and blood oxygen level will be monitored until the medicines you were given have worn off.  You may be given medicine for pain.  Do not drive for 24 hours if you received a sedative. This information is not intended to replace advice given to you by your health care provider. Make  sure you discuss any questions you have with your health care provider. Document Released: 11/14/2000 Document Revised: 12/09/2015 Document Reviewed: 11/02/2015 Elsevier Interactive Patient Education  2018 [place], Adult, Care After These instructions give you information about caring for yourself after your procedure. Your doctor may also give you more specific instructions. If you have problems or questions, contact your doctor. Follow these instructions at home: Surgical cut (incision) care   Follow instructions from your doctor about how to take care of your surgical cut area. Make sure you: ? Wash your hands with soap and water before you change your bandage (dressing). If you cannot use soap and water, use hand sanitizer. ? Change your bandage as told by your doctor. ? Leave stitches (sutures), skin glue, or skin tape (adhesive) strips in place. They may need to stay in place for 2 weeks or longer. If tape strips get loose and curl up, you may trim the  loose edges. Do not remove tape strips completely unless your doctor says it is okay.  Check your surgical cut every day for signs of infection. Check for: ? More redness, swelling, or pain. ? More fluid or blood. ? Warmth. ? Pus or a bad smell. Activity  Do not drive or use heavy machinery while taking prescription pain medicine. Do not drive until your doctor says it is okay.  Until your doctor says it is okay: ? Do not lift anything that is heavier than 10 lb (4.5 kg). ? Do not play contact sports.  Return to your normal activities as told by your doctor. Ask your doctor what activities are safe. General instructions  To prevent or treat having a hard time pooping (constipation) while you are taking prescription pain medicine, your doctor may recommend that you: ? Drink enough fluid to keep your pee (urine) clear or pale yellow. ? Take over-the-counter or prescription medicines. ? Eat foods that are  high in fiber, such as fresh fruits and vegetables, whole grains, and beans. ? Limit foods that are high in fat and processed sugars, such as fried and sweet foods.  Take over-the-counter and prescription medicines only as told by your doctor.  Do not take baths, swim, or use a hot tub until your doctor says it is okay.  Keep all follow-up visits as told by your doctor. This is important. Contact a doctor if:  You develop a rash.  You have more redness, swelling, or pain around your surgical cut.  You have more fluid or blood coming from your surgical cut.  Your surgical cut feels warm to the touch.  You have pus or a bad smell coming from your surgical cut.  You have a fever or chills.  You have blood in your poop (stool).  You have not pooped in 2-3 days.  Medicine does not help your pain. Get help right away if:  You have chest pain or you are short of breath.  You feel light-headed.  You feel weak and dizzy (feel faint).  You have very bad pain.  You throw up (vomit) and your pain is worse. This information is not intended to replace advice given to you by your health care provider. Make sure you discuss any questions you have with your health care provider. Document Released: 06/11/2014 Document Revised: 12/09/2015 Document Reviewed: 11/02/2015 Elsevier Interactive Patient Education  2017 [place] Anesthesia, Adult General anesthesia is the use of medicines to make a person "go to sleep" (be unconscious) for a medical procedure. General anesthesia is often recommended when a procedure:  Is long.  Requires you to be still or in an unusual position.  Is major and can cause you to lose blood.  Is impossible to do without general anesthesia.  The medicines used for general anesthesia are called general anesthetics. In addition to making you sleep, the medicines:  Prevent pain.  Control your blood pressure.  Relax your muscles.  Tell a  health care provider about:  Any allergies you have.  All medicines you are taking, including vitamins, herbs, eye drops, creams, and over-the-counter medicines.  Any problems you or family members have had with anesthetic medicines.  Types of anesthetics you have had in the past.  Any bleeding disorders you have.  Any surgeries you have had.  Any medical conditions you have.  Any history of heart or lung conditions, such as heart failure, sleep apnea, or chronic obstructive pulmonary disease (COPD).  Whether  you are pregnant or may be pregnant.  Whether you use tobacco, alcohol, marijuana, or street drugs.  Any history of Armed forces logistics/support/administrative officer.  Any history of depression or anxiety. What are the risks? Generally, this is a safe procedure. However, problems may occur, including:  Allergic reaction to anesthetics.  Lung and heart problems.  Inhaling food or liquids from your stomach into your lungs (aspiration).  Injury to nerves.  Waking up during your procedure and being unable to move (rare).  Extreme agitation or a state of mental confusion (delirium) when you wake up from the anesthetic.  Air in the bloodstream, which can lead to stroke.  These problems are more likely to develop if you are having a major surgery or if you have an advanced medical condition. You can prevent some of these complications by answering all of your health care provider's questions thoroughly and by following all pre-procedure instructions. General anesthesia can cause side effects, including:  Nausea or vomiting  A sore throat from the breathing tube.  Feeling cold or shivery.  Feeling tired, washed out, or achy.  Sleepiness or drowsiness.  Confusion or agitation.  What happens before the procedure? Staying hydrated Follow instructions from your health care provider about hydration, which may include:  Up to 2 hours before the procedure - you may continue to drink clear liquids,  such as water, clear fruit juice, black coffee, and plain tea.  Eating and drinking restrictions Follow instructions from your health care provider about eating and drinking, which may include:  8 hours before the procedure - stop eating heavy meals or foods such as meat, fried foods, or fatty foods.  6 hours before the procedure - stop eating light meals or foods, such as toast or cereal.  6 hours before the procedure - stop drinking milk or drinks that contain milk.  2 hours before the procedure - stop drinking clear liquids.  Medicines  Ask your health care provider about: ? Changing or stopping your regular medicines. This is especially important if you are taking diabetes medicines or blood thinners. ? Taking medicines such as aspirin and ibuprofen. These medicines can thin your blood. Do not take these medicines before your procedure if your health care provider instructs you not to. ? Taking new dietary supplements or medicines. Do not take these during the week before your procedure unless your health care provider approves them.  If you are told to take a medicine or to continue taking a medicine on the day of the procedure, take the medicine with sips of water. General instructions   Ask if you will be going home the same day, the following day, or after a longer hospital stay. ? Plan to have someone take you home. ? Plan to have someone stay with you for the first 24 hours after you leave the hospital or clinic.  For 3-6 weeks before the procedure, try not to use any tobacco products, such as cigarettes, chewing tobacco, and e-cigarettes.  You may brush your teeth on the morning of the procedure, but make sure to spit out the toothpaste. What happens during the procedure?  You will be given anesthetics through a mask and through an IV tube in one of your veins.  You may receive medicine to help you relax (sedative).  As soon as you are asleep, a breathing tube may be  used to help you breathe.  An anesthesia specialist will stay with you throughout the procedure. He or she will help keep  you comfortable and safe by continuing to give you medicines and adjusting the amount of medicine that you get. He or she will also watch your blood pressure, pulse, and oxygen levels to make sure that the anesthetics do not cause any problems.  If a breathing tube was used to help you breathe, it will be removed before you wake up. The procedure may vary among health care providers and hospitals. What happens after the procedure?  You will wake up, often slowly, after the procedure is complete, usually in a recovery area.  Your blood pressure, heart rate, breathing rate, and blood oxygen level will be monitored until the medicines you were given have worn off.  You may be given medicine to help you calm down if you feel anxious or agitated.  If you will be going home the same day, your health care provider may check to make sure you can stand, drink, and urinate.  Your health care providers will treat your pain and side effects before you go home.  Do not drive for 24 hours if you received a sedative.  You may: ? Feel nauseous and vomit. ? Have a sore throat. ? Have mental slowness. ? Feel cold or shivery. ? Feel sleepy. ? Feel tired. ? Feel sore or achy, even in parts of your body where you did not have surgery. This information is not intended to replace advice given to you by your health care provider. Make sure you discuss any questions you have with your health care provider. Document Released: 08/28/2007 Document Revised: 11/01/2015 Document Reviewed: 05/05/2015 Elsevier Interactive Patient Education  2018 [place] Anesthesia, Adult, Care After These instructions provide you with information about caring for yourself after your procedure. Your health care provider may also give you more specific instructions. Your treatment has been planned  according to current medical practices, but problems sometimes occur. Call your health care provider if you have any problems or questions after your procedure. What can I expect after the procedure? After the procedure, it is common to have:  Vomiting.  A sore throat.  Mental slowness.  It is common to feel:  Nauseous.  Cold or shivery.  Sleepy.  Tired.  Sore or achy, even in parts of your body where you did not have surgery.  Follow these instructions at home: For at least 24 hours after the procedure:  Do not: ? Participate in activities where you could fall or become injured. ? Drive. ? Use heavy machinery. ? Drink alcohol. ? Take sleeping pills or medicines that cause drowsiness. ? Make important decisions or sign legal documents. ? Take care of children on your own.  Rest. Eating and drinking  If you vomit, drink water, juice, or soup when you can drink without vomiting.  Drink enough fluid to keep your urine clear or pale yellow.  Make sure you have little or no nausea before eating solid foods.  Follow the diet recommended by your health care provider. General instructions  Have a responsible adult stay with you until you are awake and alert.  Return to your normal activities as told by your health care provider. Ask your health care provider what activities are safe for you.  Take over-the-counter and prescription medicines only as told by your health care provider.  If you smoke, do not smoke without supervision.  Keep all follow-up visits as told by your health care provider. This is important. Contact a health care provider if:  You continue to have  nausea or vomiting at home, and medicines are not helpful.  You cannot drink fluids or start eating again.  You cannot urinate after 8-12 hours.  You develop a skin rash.  You have fever.  You have increasing redness at the site of your procedure. Get help right away if:  You have  difficulty breathing.  You have chest pain.  You have unexpected bleeding.  You feel that you are having a life-threatening or urgent problem. This information is not intended to replace advice given to you by your health care provider. Make sure you discuss any questions you have with your health care provider. Document Released: 08/27/2000 Document Revised: 10/24/2015 Document Reviewed: 05/05/2015 Elsevier Interactive Patient Education  [employer].

## 2017-04-08 ENCOUNTER — Other Ambulatory Visit: Payer: Self-pay

## 2017-04-08 ENCOUNTER — Encounter (HOSPITAL_COMMUNITY): Payer: Self-pay

## 2017-04-08 ENCOUNTER — Encounter (HOSPITAL_COMMUNITY)
Admission: RE | Admit: 2017-04-08 | Discharge: 2017-04-08 | Disposition: A | Discharge status: Home / Self Care | Payer: BLUE CROSS/BLUE SHIELD | Source: Ambulatory Visit | Attending: General Surgery | Admitting: General Surgery

## 2017-04-08 DIAGNOSIS — Z7982 Long term (current) use of aspirin: Secondary | ICD-10-CM | POA: Diagnosis not present

## 2017-04-08 DIAGNOSIS — G4730 Sleep apnea, unspecified: Secondary | ICD-10-CM | POA: Diagnosis not present

## 2017-04-08 DIAGNOSIS — Z79890 Hormone replacement therapy (postmenopausal): Secondary | ICD-10-CM | POA: Diagnosis not present

## 2017-04-08 DIAGNOSIS — E785 Hyperlipidemia, unspecified: Secondary | ICD-10-CM | POA: Diagnosis not present

## 2017-04-08 DIAGNOSIS — K432 Incisional hernia without obstruction or gangrene: Secondary | ICD-10-CM | POA: Diagnosis not present

## 2017-04-08 DIAGNOSIS — E039 Hypothyroidism, unspecified: Secondary | ICD-10-CM | POA: Diagnosis not present

## 2017-04-08 DIAGNOSIS — K219 Gastro-esophageal reflux disease without esophagitis: Secondary | ICD-10-CM | POA: Diagnosis not present

## 2017-04-08 DIAGNOSIS — E119 Type 2 diabetes mellitus without complications: Secondary | ICD-10-CM | POA: Diagnosis not present

## 2017-04-08 DIAGNOSIS — I10 Essential (primary) hypertension: Secondary | ICD-10-CM | POA: Diagnosis not present

## 2017-04-08 DIAGNOSIS — Z7984 Long term (current) use of oral hypoglycemic drugs: Secondary | ICD-10-CM | POA: Diagnosis not present

## 2017-04-08 DIAGNOSIS — F329 Major depressive disorder, single episode, unspecified: Secondary | ICD-10-CM | POA: Diagnosis not present

## 2017-04-08 DIAGNOSIS — Z79899 Other long term (current) drug therapy: Secondary | ICD-10-CM | POA: Diagnosis not present

## 2017-04-08 DIAGNOSIS — Z87891 Personal history of nicotine dependence: Secondary | ICD-10-CM | POA: Diagnosis not present

## 2017-04-08 DIAGNOSIS — Z9081 Acquired absence of spleen: Secondary | ICD-10-CM | POA: Diagnosis not present

## 2017-04-08 HISTORY — DX: Unspecified osteoarthritis, unspecified site: M19.90

## 2017-04-08 LAB — GLUCOSE, CAPILLARY: GLUCOSE-CAPILLARY: 145 mg/dL — AB (ref 65–99)

## 2017-04-10 ENCOUNTER — Encounter (HOSPITAL_COMMUNITY)
Admission: RE | Disposition: A | Discharge status: Home / Self Care | Payer: Self-pay | Source: Ambulatory Visit | Attending: General Surgery

## 2017-04-10 ENCOUNTER — Encounter (HOSPITAL_COMMUNITY): Payer: Self-pay | Admitting: *Deleted

## 2017-04-10 ENCOUNTER — Ambulatory Visit (HOSPITAL_COMMUNITY): Payer: BLUE CROSS/BLUE SHIELD | Admitting: Anesthesiology

## 2017-04-10 ENCOUNTER — Ambulatory Visit (HOSPITAL_COMMUNITY)
Admission: RE | Admit: 2017-04-10 | Discharge: 2017-04-10 | Disposition: A | Discharge status: Home / Self Care | Payer: BLUE CROSS/BLUE SHIELD | Source: Ambulatory Visit | Attending: General Surgery | Admitting: General Surgery

## 2017-04-10 DIAGNOSIS — E039 Hypothyroidism, unspecified: Secondary | ICD-10-CM | POA: Insufficient documentation

## 2017-04-10 DIAGNOSIS — E785 Hyperlipidemia, unspecified: Secondary | ICD-10-CM | POA: Insufficient documentation

## 2017-04-10 DIAGNOSIS — I10 Essential (primary) hypertension: Secondary | ICD-10-CM | POA: Diagnosis not present

## 2017-04-10 DIAGNOSIS — Z87891 Personal history of nicotine dependence: Secondary | ICD-10-CM | POA: Diagnosis not present

## 2017-04-10 DIAGNOSIS — Z9081 Acquired absence of spleen: Secondary | ICD-10-CM | POA: Diagnosis not present

## 2017-04-10 DIAGNOSIS — Z79890 Hormone replacement therapy (postmenopausal): Secondary | ICD-10-CM | POA: Insufficient documentation

## 2017-04-10 DIAGNOSIS — F329 Major depressive disorder, single episode, unspecified: Secondary | ICD-10-CM | POA: Diagnosis not present

## 2017-04-10 DIAGNOSIS — Z79899 Other long term (current) drug therapy: Secondary | ICD-10-CM | POA: Diagnosis not present

## 2017-04-10 DIAGNOSIS — G4730 Sleep apnea, unspecified: Secondary | ICD-10-CM | POA: Diagnosis not present

## 2017-04-10 DIAGNOSIS — E119 Type 2 diabetes mellitus without complications: Secondary | ICD-10-CM | POA: Insufficient documentation

## 2017-04-10 DIAGNOSIS — K432 Incisional hernia without obstruction or gangrene: Secondary | ICD-10-CM | POA: Diagnosis not present

## 2017-04-10 DIAGNOSIS — Z7982 Long term (current) use of aspirin: Secondary | ICD-10-CM | POA: Diagnosis not present

## 2017-04-10 DIAGNOSIS — Z7984 Long term (current) use of oral hypoglycemic drugs: Secondary | ICD-10-CM | POA: Diagnosis not present

## 2017-04-10 DIAGNOSIS — K219 Gastro-esophageal reflux disease without esophagitis: Secondary | ICD-10-CM | POA: Insufficient documentation

## 2017-04-10 HISTORY — PX: INCISIONAL HERNIA REPAIR: SHX193

## 2017-04-10 LAB — GLUCOSE, CAPILLARY
GLUCOSE-CAPILLARY: 130 mg/dL — AB (ref 65–99)
GLUCOSE-CAPILLARY: 133 mg/dL — AB (ref 65–99)

## 2017-04-10 SURGERY — REPAIR, HERNIA, INCISIONAL
Anesthesia: General

## 2017-04-10 MED ORDER — ACETAMINOPHEN 325 MG PO TABS
ORAL_TABLET | ORAL | Status: AC
  Filled 2017-04-10: qty 2

## 2017-04-10 MED ORDER — CEFAZOLIN SODIUM-DEXTROSE 2-4 GM/100ML-% IV SOLN
2.0000 g | Freq: Once | INTRAVENOUS | Status: AC
  Administered 2017-04-10: 3 g via INTRAVENOUS
  Filled 2017-04-10: qty 100

## 2017-04-10 MED ORDER — KETOROLAC TROMETHAMINE 30 MG/ML IJ SOLN
30.0000 mg | Freq: Once | INTRAMUSCULAR | Status: AC
  Administered 2017-04-10: 30 mg via INTRAVENOUS
  Filled 2017-04-10: qty 1

## 2017-04-10 MED ORDER — CHLORHEXIDINE GLUCONATE CLOTH 2 % EX PADS: 6.0000 | MEDICATED_PAD | Freq: Once | CUTANEOUS | Status: DC

## 2017-04-10 MED ORDER — LIDOCAINE HCL (CARDIAC) 20 MG/ML IV SOLN
INTRAVENOUS | Status: DC | PRN
  Administered 2017-04-10: 50 mg via INTRAVENOUS

## 2017-04-10 MED ORDER — SUGAMMADEX SODIUM 500 MG/5ML IV SOLN
INTRAVENOUS | Status: DC | PRN
  Administered 2017-04-10: 300 mg via INTRAVENOUS

## 2017-04-10 MED ORDER — HYDROCODONE-ACETAMINOPHEN 7.5-325 MG PO TABS: 1.0000 | ORAL_TABLET | ORAL | 0 refills | Status: DC | PRN

## 2017-04-10 MED ORDER — FENTANYL CITRATE (PF) 100 MCG/2ML IJ SOLN
INTRAMUSCULAR | Status: AC
  Filled 2017-04-10: qty 2

## 2017-04-10 MED ORDER — GLYCOPYRROLATE 0.2 MG/ML IJ SOLN
INTRAMUSCULAR | Status: AC
  Filled 2017-04-10: qty 1

## 2017-04-10 MED ORDER — 0.9 % SODIUM CHLORIDE (POUR BTL) OPTIME
TOPICAL | Status: DC | PRN
  Administered 2017-04-10: 1000 mL

## 2017-04-10 MED ORDER — CEFAZOLIN SODIUM-DEXTROSE 1-4 GM/50ML-% IV SOLN
1.0000 g | Freq: Once | INTRAVENOUS | Status: DC
  Filled 2017-04-10: qty 50

## 2017-04-10 MED ORDER — FENTANYL CITRATE (PF) 100 MCG/2ML IJ SOLN
25.0000 ug | INTRAMUSCULAR | Status: AC
  Administered 2017-04-10 (×3): 25 ug via INTRAVENOUS

## 2017-04-10 MED ORDER — ONDANSETRON 4 MG PO TBDP
4.0000 mg | ORAL_TABLET | Freq: Once | ORAL | Status: AC
  Administered 2017-04-10: 4 mg via ORAL

## 2017-04-10 MED ORDER — POVIDONE-IODINE 10 % EX OINT
TOPICAL_OINTMENT | CUTANEOUS | Status: AC
  Filled 2017-04-10: qty 1

## 2017-04-10 MED ORDER — ONDANSETRON 4 MG PO TBDP
ORAL_TABLET | ORAL | Status: AC
  Filled 2017-04-10: qty 1

## 2017-04-10 MED ORDER — PROPOFOL 10 MG/ML IV BOLUS
INTRAVENOUS | Status: DC | PRN
  Administered 2017-04-10: 200 mg via INTRAVENOUS
  Administered 2017-04-10: 70 mg via INTRAVENOUS

## 2017-04-10 MED ORDER — LACTATED RINGERS IV SOLN
INTRAVENOUS | Status: DC
  Administered 2017-04-10 (×2): via INTRAVENOUS

## 2017-04-10 MED ORDER — GLYCOPYRROLATE 0.2 MG/ML IJ SOLN
INTRAMUSCULAR | Status: DC | PRN
  Administered 2017-04-10: 0.2 mg via INTRAVENOUS

## 2017-04-10 MED ORDER — BUPIVACAINE LIPOSOME 1.3 % IJ SUSP
INTRAMUSCULAR | Status: AC
  Filled 2017-04-10: qty 20

## 2017-04-10 MED ORDER — POVIDONE-IODINE 10 % OINT PACKET
TOPICAL_OINTMENT | CUTANEOUS | Status: DC | PRN
  Administered 2017-04-10: 1 via TOPICAL

## 2017-04-10 MED ORDER — ACETAMINOPHEN 325 MG PO TABS
650.0000 mg | ORAL_TABLET | Freq: Once | ORAL | Status: AC
  Administered 2017-04-10: 650 mg via ORAL

## 2017-04-10 MED ORDER — DEXTROSE 5 % IV SOLN: 3.0000 g | INTRAVENOUS | Status: DC

## 2017-04-10 MED ORDER — PROPOFOL 10 MG/ML IV BOLUS
INTRAVENOUS | Status: AC
  Filled 2017-04-10: qty 20

## 2017-04-10 MED ORDER — SUCCINYLCHOLINE CHLORIDE 20 MG/ML IJ SOLN
INTRAMUSCULAR | Status: DC | PRN
  Administered 2017-04-10: 180 mg via INTRAVENOUS

## 2017-04-10 MED ORDER — BUPIVACAINE LIPOSOME 1.3 % IJ SUSP
INTRAMUSCULAR | Status: DC | PRN
  Administered 2017-04-10: 20 mL

## 2017-04-10 MED ORDER — LIDOCAINE HCL (PF) 1 % IJ SOLN
INTRAMUSCULAR | Status: AC
  Filled 2017-04-10: qty 15

## 2017-04-10 MED ORDER — EPHEDRINE SULFATE 50 MG/ML IJ SOLN
INTRAMUSCULAR | Status: DC | PRN
  Administered 2017-04-10: 10 mg via INTRAVENOUS

## 2017-04-10 MED ORDER — MIDAZOLAM HCL 2 MG/2ML IJ SOLN
INTRAMUSCULAR | Status: AC
  Filled 2017-04-10: qty 2

## 2017-04-10 MED ORDER — FENTANYL CITRATE (PF) 100 MCG/2ML IJ SOLN
INTRAMUSCULAR | Status: DC | PRN
  Administered 2017-04-10 (×5): 50 ug via INTRAVENOUS

## 2017-04-10 MED ORDER — MIDAZOLAM HCL 2 MG/2ML IJ SOLN
1.0000 mg | Freq: Once | INTRAMUSCULAR | Status: AC | PRN
  Administered 2017-04-10: 2 mg via INTRAVENOUS

## 2017-04-10 MED ORDER — ROCURONIUM BROMIDE 100 MG/10ML IV SOLN
INTRAVENOUS | Status: DC | PRN
  Administered 2017-04-10: 20 mg via INTRAVENOUS
  Administered 2017-04-10: 10 mg via INTRAVENOUS

## 2017-04-10 SURGICAL SUPPLY — 33 items
BAG HAMPER (MISCELLANEOUS) ×2 IMPLANT
BLADE SURG SZ11 CARB STEEL (BLADE) ×2 IMPLANT
CHLORAPREP W/TINT 26ML (MISCELLANEOUS) ×2 IMPLANT
CLOTH BEACON ORANGE TIMEOUT ST (SAFETY) ×2 IMPLANT
COVER LIGHT HANDLE STERIS (MISCELLANEOUS) ×4 IMPLANT
ELECT REM PT RETURN 9FT ADLT (ELECTROSURGICAL) ×2
ELECTRODE REM PT RTRN 9FT ADLT (ELECTROSURGICAL) ×1 IMPLANT
GAUZE SPONGE 4X4 12PLY STRL (GAUZE/BANDAGES/DRESSINGS) ×2 IMPLANT
GLOVE BIO SURGEON STRL SZ7 (GLOVE) ×2 IMPLANT
GLOVE BIOGEL PI IND STRL 7.0 (GLOVE) ×1 IMPLANT
GLOVE BIOGEL PI INDICATOR 7.0 (GLOVE) ×1
GLOVE SURG SS PI 7.5 STRL IVOR (GLOVE) ×2 IMPLANT
GOWN STRL REUS W/ TWL LRG LVL3 (GOWN DISPOSABLE) ×1 IMPLANT
GOWN STRL REUS W/ TWL XL LVL3 (GOWN DISPOSABLE) ×1 IMPLANT
GOWN STRL REUS W/TWL LRG LVL3 (GOWN DISPOSABLE) ×1
GOWN STRL REUS W/TWL XL LVL3 (GOWN DISPOSABLE) ×1
INST SET MINOR GENERAL (KITS) ×2 IMPLANT
KIT ROOM TURNOVER APOR (KITS) ×2 IMPLANT
MANIFOLD NEPTUNE II (INSTRUMENTS) ×2 IMPLANT
MESH VENTRALEX ST 1-7/10 CRC S (Mesh General) ×2 IMPLANT
NEEDLE HYPO 21X1.5 SAFETY (NEEDLE) ×2 IMPLANT
NS IRRIG 1000ML POUR BTL (IV SOLUTION) ×2 IMPLANT
PACK MINOR (CUSTOM PROCEDURE TRAY) ×2 IMPLANT
PAD ARMBOARD 7.5X6 YLW CONV (MISCELLANEOUS) ×2 IMPLANT
SET BASIN LINEN APH (SET/KITS/TRAYS/PACK) ×2 IMPLANT
STAPLER VISISTAT (STAPLE) ×2 IMPLANT
SUT ETHIBOND 0 MO6 C/R (SUTURE) ×2 IMPLANT
SUT VIC AB 2-0 CT1 27 (SUTURE) ×1
SUT VIC AB 2-0 CT1 TAPERPNT 27 (SUTURE) ×1 IMPLANT
SUT VIC AB 3-0 SH 27 (SUTURE) ×1
SUT VIC AB 3-0 SH 27X BRD (SUTURE) ×1 IMPLANT
SYR 20CC LL (SYRINGE) ×2 IMPLANT
TAPE CLOTH SURG 4X10 WHT LF (GAUZE/BANDAGES/DRESSINGS) ×2 IMPLANT

## 2017-04-10 NOTE — Discharge Instructions (Signed)
Open Hernia Repair, Adult, Care After This sheet gives you information about how to care for yourself after your procedure. Your health care provider may also give you more specific instructions. If you have problems or questions, contact your health care provider. What can I expect after the procedure? After the procedure, it is common to have:  Mild discomfort.  Slight bruising.  Minor swelling.  Pain in the abdomen.  Follow these instructions at home: Incision care   Follow instructions from your health care provider about how to take care of your incision area. Make sure you: ? Wash your hands with soap and water before you change your bandage (dressing). If soap and water are not available, use hand sanitizer. ? Change your dressing as told by your health care provider. ? Leave stitches (sutures), skin glue, or adhesive strips in place. These skin closures may need to stay in place for 2 weeks or longer. If adhesive strip edges start to loosen and curl up, you may trim the loose edges. Do not remove adhesive strips completely unless your health care provider tells you to do that.  Check your incision area every day for signs of infection. Check for: ? More redness, swelling, or pain. ? More fluid or blood. ? Warmth. ? Pus or a bad smell. Activity  Do not drive or use heavy machinery while taking prescription pain medicine. Do not drive until your health care provider approves.  Until your health care provider approves: ? Do not lift anything that is heavier than 10 lb (4.5 kg). ? Do not play contact sports.  Return to your normal activities as told by your health care provider. Ask your health care provider what activities are safe. General instructions  To prevent or treat constipation while you are taking prescription pain medicine, your health care provider may recommend that you: ? Drink enough fluid to keep your urine clear or pale yellow. ? Take over-the-counter or  prescription medicines. ? Eat foods that are high in fiber, such as fresh fruits and vegetables, whole grains, and beans. ? Limit foods that are high in fat and processed sugars, such as fried and sweet foods.  Take over-the-counter and prescription medicines only as told by your health care provider.  Do not take tub baths or go swimming until your health care provider approves.  Keep all follow-up visits as told by your health care provider. This is important. Contact a health care provider if:  You develop a rash.  You have more redness, swelling, or pain around your incision.  You have more fluid or blood coming from your incision.  Your incision feels warm to the touch.  You have pus or a bad smell coming from your incision.  You have a fever or chills.  You have blood in your stool (feces).  You have not had a bowel movement in 2-3 days.  Your pain is not controlled with medicine. Get help right away if:  You have chest pain or shortness of breath.  You feel light-headed or feel faint.  You have severe pain.  You vomit and your pain is worse. This information is not intended to replace advice given to you by your health care provider. Make sure you discuss any questions you have with your health care provider. Document Released: 12/08/2004 Document Revised: 12/09/2015 Document Reviewed: 11/02/2015 Elsevier Interactive Patient Education  2017 [employer].

## 2017-04-10 NOTE — Anesthesia Procedure Notes (Signed)
Procedure Name: Intubation Date/Time: 04/10/2017 3:18 PM Performed by: Andree Elk, July Linam A, CRNA Pre-anesthesia Checklist: Emergency Drugs available, Timeout performed, Patient identified, Suction available and Patient being monitored Patient Re-evaluated:Patient Re-evaluated prior to induction Oxygen Delivery Method: Circle system utilized Preoxygenation: Pre-oxygenation with 100% oxygen Induction Type: IV induction and Cricoid Pressure applied Ventilation: Mask ventilation without difficulty Laryngoscope Size: Glidescope and 4 Grade View: Grade I Tube size: 7.0 mm Number of attempts: 1 Airway Equipment and Method: Video-laryngoscopy Placement Confirmation: ETT inserted through vocal cords under direct vision,  positive ETCO2 and breath sounds checked- equal and bilateral Secured at: 23 cm Tube secured with: Tape Dental Injury: Teeth and Oropharynx as per pre-operative assessment

## 2017-04-10 NOTE — Interval H&P Note (Signed)
History and Physical Interval Note:  04/10/2017 2:54 PM  [name] [name]  has presented today for surgery, with the diagnosis of incisional hernia  The various methods of treatment have been discussed with the patient and family. After consideration of risks, benefits and other options for treatment, the patient has consented to  Procedure(s): HERNIA REPAIR INCISIONAL WITH MESH (N/A) as a surgical intervention .  The patient's history has been reviewed, patient examined, no change in status, stable for surgery.  I have reviewed the patient's chart and labs.  Questions were answered to the patient's satisfaction.     Aviva Signs

## 2017-04-10 NOTE — Addendum Note (Signed)
Addendum  created 04/10/17 1630 by Mickel Baas, CRNA   Intraprocedure Meds edited

## 2017-04-10 NOTE — Op Note (Signed)
Patient:  [name] [name]  DOB:  21-Apr-1966  MRN:  [id number]   Preop Diagnosis: Incisional hernia  Postop Diagnosis: Same  Procedure: Incisional herniorrhaphy with mesh  Surgeon: Aviva Signs, MD  Anes: General endotracheal  Indications: Patient is a 51 year old white male status post laparoscopic cholecystectomy in the remote past who presents with an incisional hernia at the umbilical trocar site.  The risks and benefits of the procedure including bleeding, infection, mesh use, and the possibility of recurrence of the hernia were fully explained to the patient, who gave informed consent.  Procedure note: The patient was placed in the supine position.  After induction of general endotracheal anesthesia, the abdomen was prepped and draped using the usual sterile technique with DuraPrep.  Surgical site confirmation was performed.  A supraumbilical incision was made down to the fascia.  The hernia was noted to be in continuity with the base of the umbilicus.  The base of the umbilicus was freed away from the underlying fascia.  A 1 cm hernia sac was found.  This was excised and the adipose tissue was reduced.  A 4.3 cm Bard Ventralax ST patch was then inserted into the abdominal cavity and secured to the fascia using 0 Ethibond interrupted sutures.  The overlying fascia was then closed transversely using 0 Ethibond interrupted sutures.  The base of the umbilicus was carried back to the fascia using a 2-0 Vicryl suture.  The subcutaneous layer was reapproximated using a 3-0 Vicryl interrupted suture.  Exparel was instilled into the surrounding wound.  The skin was closed using staples.  Betadine ointment and a dry sterile dressing were applied.  All tape needle counts were correct at the end of the procedure.  The patient was extubated in the operating room and transferred to PACU in stable condition.  Complications: None  EBL: Minimal  Specimen: None

## 2017-04-10 NOTE — Transfer of Care (Signed)
Immediate Anesthesia Transfer of Care Note  Patient: [name] [name]  Procedure(s) Performed: Dorian [place]ace WITH MESH (N/A )  Patient Location: PACU  Anesthesia Type:General  Level of Consciousness: awake, alert , oriented and patient cooperative  Airway & Oxygen Therapy: Patient Spontanous Breathing and Patient connected to face mask oxygen  Post-op Assessment: Report given to RN and Post -op Vital signs reviewed and stable  Post vital signs: Reviewed and stable  Last Vitals:  Vitals:   04/10/17 1430 04/10/17 1445  BP: (!) 145/93 (!) 137/92  Resp: 15 17  Temp:    SpO2: 96% 96%    Last Pain:  Vitals:   04/10/17 1445  TempSrc:   PainSc: 4       Patients Stated Pain Goal: 9 (32/67/12 4580)  Complications: No apparent anesthesia complications

## 2017-04-10 NOTE — Anesthesia Postprocedure Evaluation (Deleted)
Anesthesia Post Note  Patient: [name] [name]  Procedure(s) Performed: HERNIA REPAIR INCISIONAL WITH MESH (N/A )  Patient location during evaluation: PACU Anesthesia Type: General Level of consciousness: awake and patient cooperative Pain management: pain level controlled Vital Signs Assessment: post-procedure vital signs reviewed and stable Respiratory status: spontaneous breathing, nonlabored ventilation and respiratory function stable Cardiovascular status: blood pressure returned to baseline Postop Assessment: no apparent nausea or vomiting Anesthetic complications: no     Last Vitals: There were no vitals filed for this visit.  Last Pain: There were no vitals filed for this visit.               Nishtha Raider J

## 2017-04-10 NOTE — Anesthesia Postprocedure Evaluation (Signed)
Anesthesia Post Note  Patient: [name] [name]  Procedure(s) Performed: INCISIONAL HERNIORRHAPHY WITH MESH (N/A )  Patient location during evaluation: PACU Anesthesia Type: General Level of consciousness: awake and alert, oriented and patient cooperative Pain management: pain level controlled Vital Signs Assessment: post-procedure vital signs reviewed and stable Respiratory status: spontaneous breathing and respiratory function stable Cardiovascular status: stable Postop Assessment: no apparent nausea or vomiting Anesthetic complications: no     Last Vitals:  Vitals:   04/10/17 1605 04/10/17 1615  BP: (!) 143/82 130/73  Pulse: 89 84  Resp: 16 17  Temp: 36.7 C   SpO2: 100% 100%    Last Pain:  Vitals:   04/10/17 1445  TempSrc:   PainSc: 4                  ADAMS, AMY A

## 2017-04-10 NOTE — Anesthesia Preprocedure Evaluation (Signed)
Anesthesia Evaluation  Patient identified by MRN, date of birth, ID band Patient awake    History of Anesthesia Complications (+) PONV and history of anesthetic complications (awareness under surgery (splectomy for ITP))  Airway Mallampati: II  TM Distance: >3 FB Neck ROM: Full  Mouth opening: Limited Mouth Opening Comment: Thick neck, no hx of problems with airway according to patient.  Dental  (+) Poor Dentition   Pulmonary asthma , sleep apnea (CPAP  "12") , former smoker,    Pulmonary exam normal breath sounds clear to auscultation       Cardiovascular Exercise Tolerance: Poor hypertension, Normal cardiovascular exam+ dysrhythmias  Rhythm:Regular Rate:Normal  - Left ventricle: The cavity size was normal. There was mild concentric hypertrophy. Systolic function was normal. The estimated ejection fraction was in the range of 55% to 60%. Wall motion was normal; there were no regional wall motion abnormalities. - Aortic valve: Mildly calcified annulus. Trileaflet; normal thickness leaflets. - Mitral valve: Calcified annulus   (2014) Normal sinus rhythm (2018) Left anterior fascicular block Abnormal ECG    Neuro/Psych PSYCHIATRIC DISORDERS Depression    GI/Hepatic GERD  Controlled and Medicated,  Endo/Other  diabetes, Well Controlled, Type 1, Oral Hypoglycemic AgentsHypothyroidism   Renal/GU Renal disease     Musculoskeletal   Abdominal   Peds  Hematology Results for DIANA, ARMIJO (MRN [id number]) as of 04/10/2017 11:54  02/13/2017 11:10 Sodium: 136 Potassium: 3.7 Chloride: 101 CO2: 25 Glucose: 238 (H) BUN: 11 Creatinine: 0.80    Anesthesia Other Findings   Reproductive/Obstetrics                             Anesthesia Physical Anesthesia Plan  ASA: IV  Anesthesia Plan:    Post-op Pain Management:    Induction: Intravenous  PONV Risk Score and Plan: 2 and  Ondansetron and Midazolam  Airway Management Planned: Oral ETT  Additional Equipment:   Intra-op Plan:   Post-operative Plan: Extubation in OR  Informed Consent: I have reviewed the patients History and Physical, chart, labs and discussed the procedure including the risks, benefits and alternatives for the proposed anesthesia with the patient or authorized representative who has indicated his/her understanding and acceptance.     Plan Discussed with: CRNA and Surgeon  Anesthesia Plan Comments:         Anesthesia Quick Evaluation

## 2017-04-11 ENCOUNTER — Encounter (HOSPITAL_COMMUNITY): Payer: Self-pay | Admitting: General Surgery

## 2017-04-18 ENCOUNTER — Ambulatory Visit (INDEPENDENT_AMBULATORY_CARE_PROVIDER_SITE_OTHER): Payer: BLUE CROSS/BLUE SHIELD | Admitting: General Surgery

## 2017-04-18 ENCOUNTER — Encounter: Payer: Self-pay | Admitting: General Surgery

## 2017-04-18 VITALS — BP 157/91 | HR 81 | Temp 97.5°F | Resp 18 | Ht 67.0 in | Wt 318.0 lb

## 2017-04-18 DIAGNOSIS — Z09 Encounter for follow-up examination after completed treatment for conditions other than malignant neoplasm: Secondary | ICD-10-CM

## 2017-04-18 NOTE — Progress Notes (Signed)
Subjective:     [name] [name]  Status post incisional herniorrhaphy with mesh.  Doing well.  Minimal pain at incision site. Objective:    BP (!) 157/91   Pulse 81   Temp (!) 97.5 F (36.4 C)   Resp 18   Ht 5\' 7"  (1.702 m)   Wt (!) 318 lb (144.2 kg)   BMI 49.81 kg/m   General:  alert, cooperative and no distress  Abdomen soft, incision healing well.  Staples removed, Steri-Strips applied.     Assessment:    Doing well postoperatively.    Plan:   Increase activity as able.  Follow-up as needed.

## 2017-05-11 DIAGNOSIS — J069 Acute upper respiratory infection, unspecified: Secondary | ICD-10-CM | POA: Diagnosis not present

## 2017-10-02 DIAGNOSIS — I10 Essential (primary) hypertension: Secondary | ICD-10-CM | POA: Diagnosis not present

## 2017-10-02 DIAGNOSIS — E1165 Type 2 diabetes mellitus with hyperglycemia: Secondary | ICD-10-CM | POA: Diagnosis not present

## 2017-10-02 DIAGNOSIS — G4730 Sleep apnea, unspecified: Secondary | ICD-10-CM | POA: Diagnosis not present

## 2017-10-02 DIAGNOSIS — Z1389 Encounter for screening for other disorder: Secondary | ICD-10-CM | POA: Diagnosis not present

## 2017-10-02 DIAGNOSIS — Z6841 Body Mass Index (BMI) 40.0 and over, adult: Secondary | ICD-10-CM | POA: Diagnosis not present

## 2017-10-02 DIAGNOSIS — R0683 Snoring: Secondary | ICD-10-CM | POA: Diagnosis not present

## 2017-10-09 DIAGNOSIS — E890 Postprocedural hypothyroidism: Secondary | ICD-10-CM | POA: Diagnosis not present

## 2017-10-09 DIAGNOSIS — E2749 Other adrenocortical insufficiency: Secondary | ICD-10-CM | POA: Diagnosis not present

## 2017-10-09 DIAGNOSIS — E291 Testicular hypofunction: Secondary | ICD-10-CM | POA: Diagnosis not present

## 2017-10-09 DIAGNOSIS — E049 Nontoxic goiter, unspecified: Secondary | ICD-10-CM | POA: Diagnosis not present

## 2017-11-02 IMAGING — DX DG LUMBAR SPINE COMPLETE 4+V
7 series · 7 of 7 positions shown · non-contrast
Comparison: 12/17/2012 CT reformats of the lumbar spine

CLINICAL DATA: Pain after fall with bruising along the left lower
back.

EXAM:
LUMBAR SPINE - COMPLETE 4+ VIEW

[l-spine ap (1 of 2)]
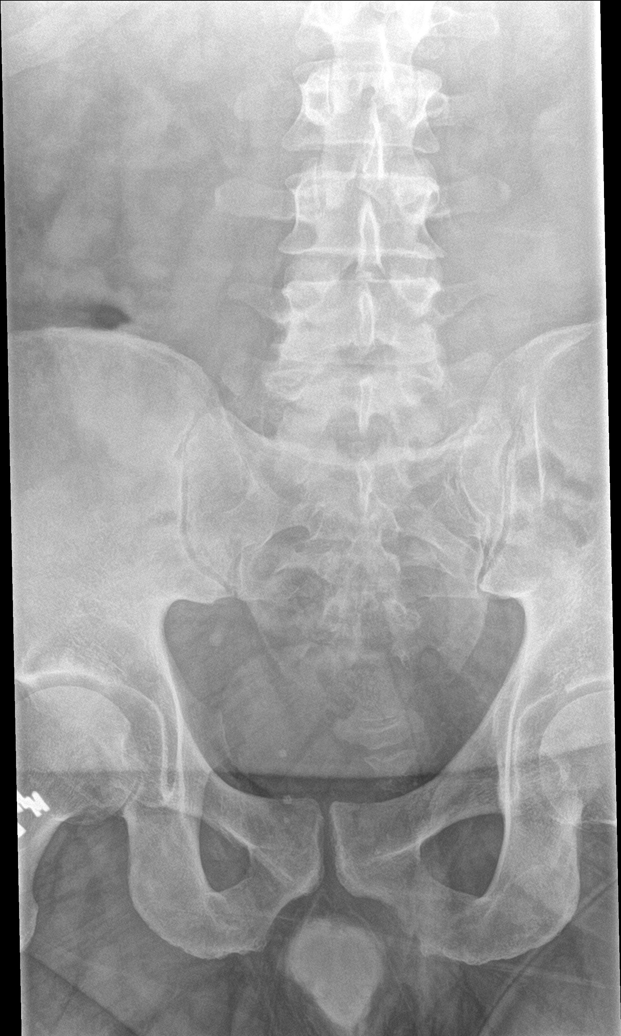

[l-spine obl (1 of 3)]
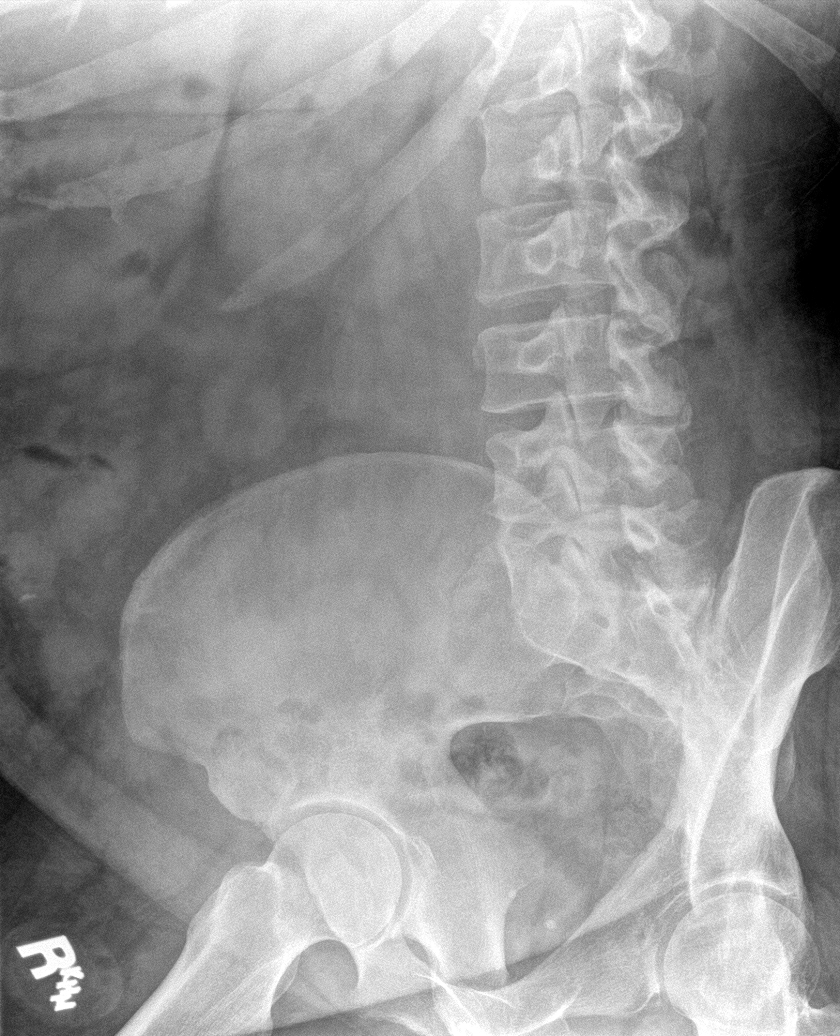

[l-spine obl (2 of 3)]
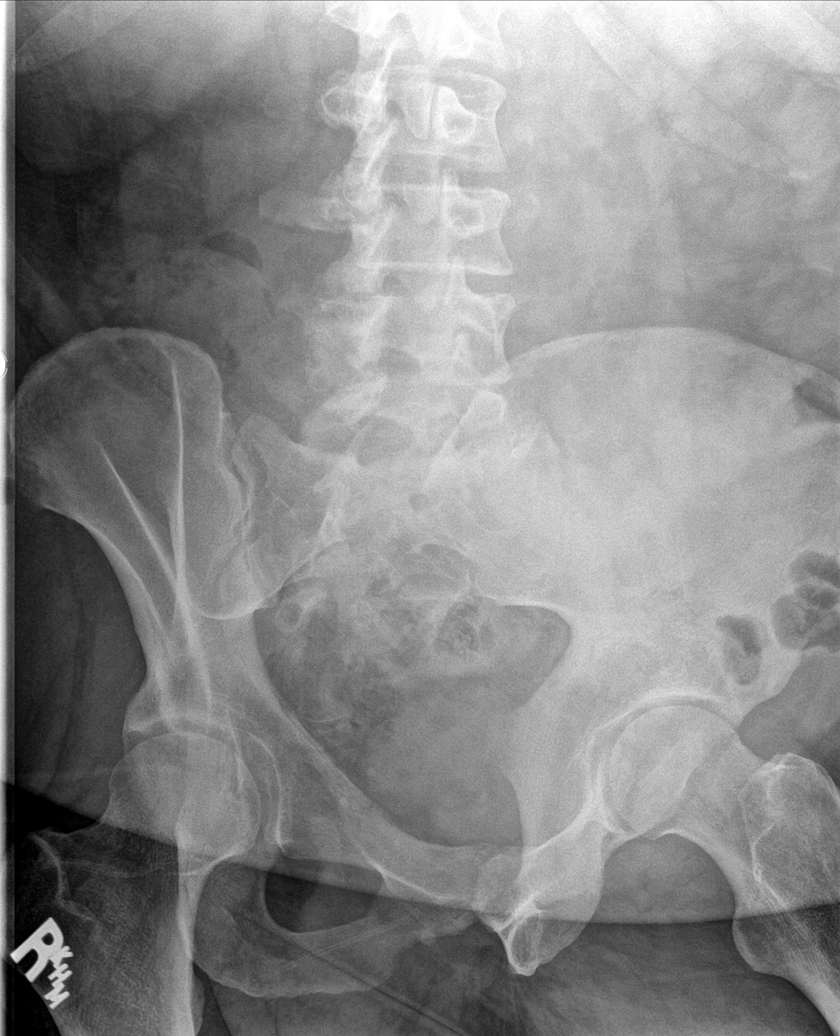

[l-spine lat]
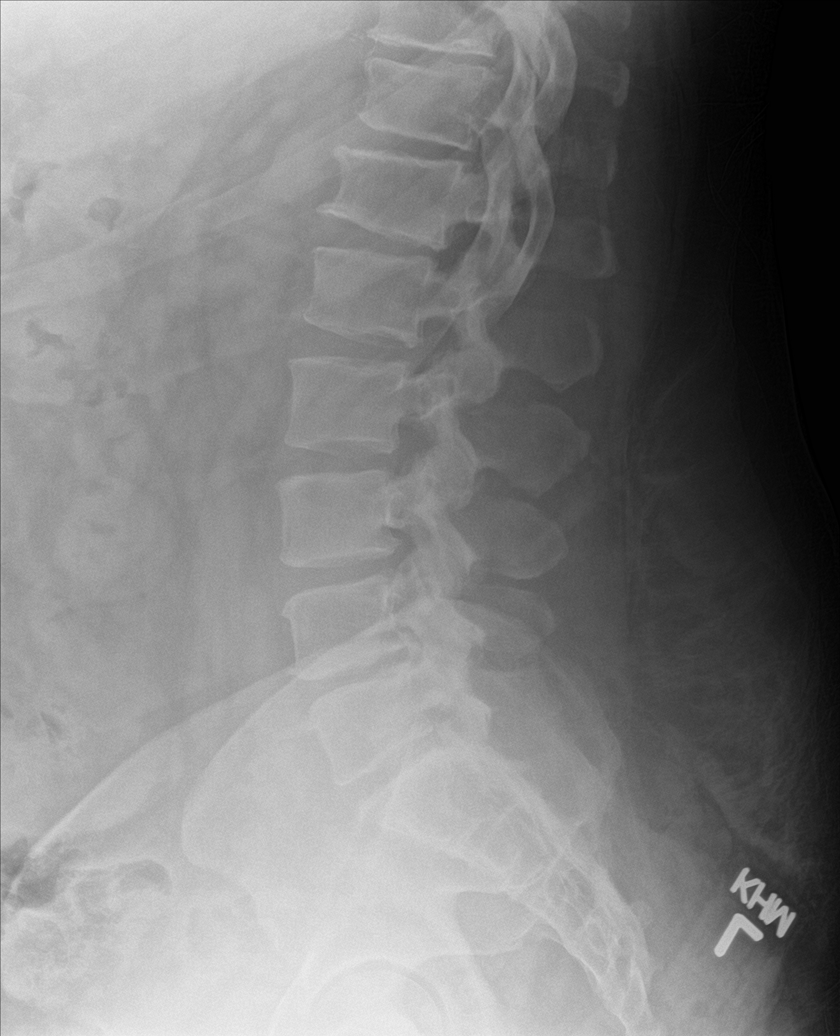

[l-spine spot]
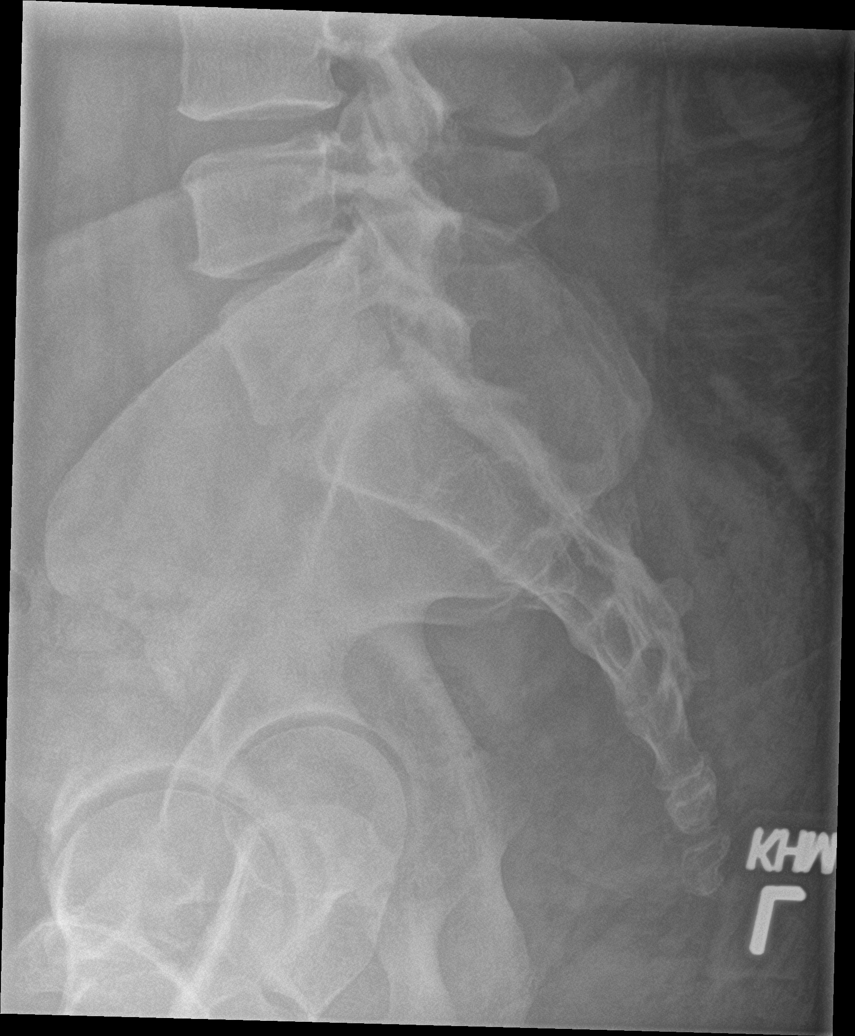

[l-spine ap (2 of 2)]
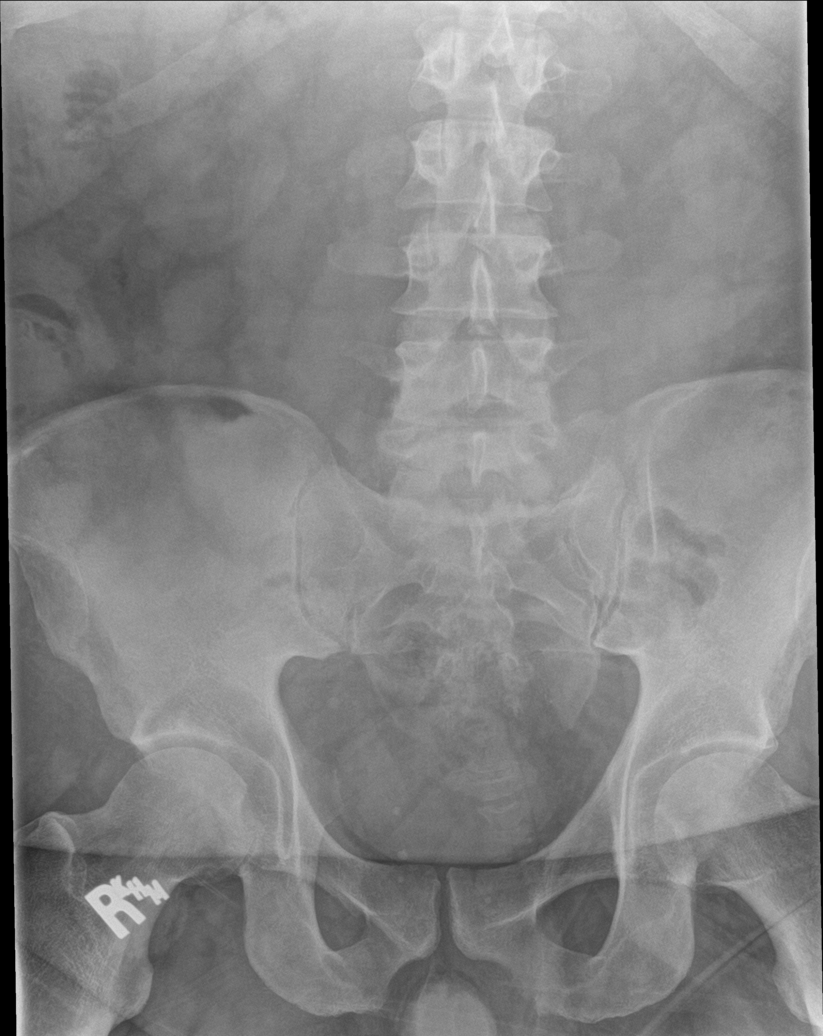

[l-spine obl (3 of 3)]
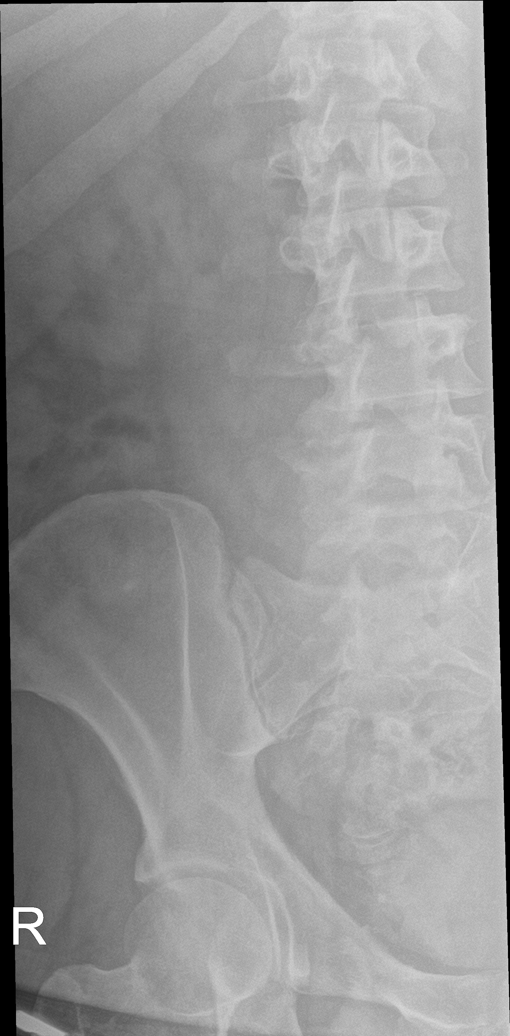

[7 of 7 positions shown; findings below may reference images not displayed]

FINDINGS: No acute vertebral body fracture. Slight disc space narrowing at
L4-5. No spondylolysis nor spondylolisthesis. Mild physiologic
anterior wedging of T11, T12 and L1.
IMPRESSION: Chronic slight disc space narrowing at L4-5. No acute osseous
abnormality.

## 2017-11-08 ENCOUNTER — Emergency Department (HOSPITAL_COMMUNITY)
Admission: EM | Admit: 2017-11-08 | Discharge: 2017-11-08 | Disposition: A | Discharge status: Home / Self Care | Payer: BLUE CROSS/BLUE SHIELD | Attending: Emergency Medicine | Admitting: Emergency Medicine

## 2017-11-08 ENCOUNTER — Other Ambulatory Visit: Payer: Self-pay

## 2017-11-08 ENCOUNTER — Encounter (HOSPITAL_COMMUNITY): Payer: Self-pay | Admitting: Emergency Medicine

## 2017-11-08 ENCOUNTER — Emergency Department (HOSPITAL_COMMUNITY): Payer: BLUE CROSS/BLUE SHIELD

## 2017-11-08 DIAGNOSIS — R197 Diarrhea, unspecified: Secondary | ICD-10-CM | POA: Diagnosis not present

## 2017-11-08 DIAGNOSIS — R1031 Right lower quadrant pain: Secondary | ICD-10-CM | POA: Insufficient documentation

## 2017-11-08 DIAGNOSIS — I10 Essential (primary) hypertension: Secondary | ICD-10-CM | POA: Insufficient documentation

## 2017-11-08 DIAGNOSIS — R109 Unspecified abdominal pain: Secondary | ICD-10-CM | POA: Diagnosis not present

## 2017-11-08 DIAGNOSIS — R110 Nausea: Secondary | ICD-10-CM | POA: Diagnosis not present

## 2017-11-08 DIAGNOSIS — E119 Type 2 diabetes mellitus without complications: Secondary | ICD-10-CM | POA: Insufficient documentation

## 2017-11-08 DIAGNOSIS — R945 Abnormal results of liver function studies: Secondary | ICD-10-CM | POA: Insufficient documentation

## 2017-11-08 DIAGNOSIS — J45909 Unspecified asthma, uncomplicated: Secondary | ICD-10-CM | POA: Insufficient documentation

## 2017-11-08 DIAGNOSIS — Z7982 Long term (current) use of aspirin: Secondary | ICD-10-CM | POA: Insufficient documentation

## 2017-11-08 DIAGNOSIS — Z7984 Long term (current) use of oral hypoglycemic drugs: Secondary | ICD-10-CM | POA: Insufficient documentation

## 2017-11-08 DIAGNOSIS — Z87891 Personal history of nicotine dependence: Secondary | ICD-10-CM | POA: Insufficient documentation

## 2017-11-08 DIAGNOSIS — R7989 Other specified abnormal findings of blood chemistry: Secondary | ICD-10-CM | POA: Diagnosis not present

## 2017-11-08 DIAGNOSIS — E039 Hypothyroidism, unspecified: Secondary | ICD-10-CM | POA: Diagnosis not present

## 2017-11-08 LAB — CBC
HEMATOCRIT: 43.5 % (ref 39.0–52.0)
HEMOGLOBIN: 14 g/dL (ref 13.0–17.0)
MCH: 26.8 pg (ref 26.0–34.0)
MCHC: 32.2 g/dL (ref 30.0–36.0)
MCV: 83.3 fL (ref 78.0–100.0)
Platelets: 532 10*3/uL — ABNORMAL HIGH (ref 150–400)
RBC: 5.22 MIL/uL (ref 4.22–5.81)
RDW: 15.9 % — ABNORMAL HIGH (ref 11.5–15.5)
WBC: 10.3 10*3/uL (ref 4.0–10.5)

## 2017-11-08 LAB — URINALYSIS, ROUTINE W REFLEX MICROSCOPIC
Bilirubin Urine: NEGATIVE
Glucose, UA: NEGATIVE mg/dL
Hgb urine dipstick: NEGATIVE
Ketones, ur: NEGATIVE mg/dL
LEUKOCYTES UA: NEGATIVE
NITRITE: NEGATIVE
PH: 6 (ref 5.0–8.0)
Protein, ur: NEGATIVE mg/dL
SPECIFIC GRAVITY, URINE: 1.033 — AB (ref 1.005–1.030)

## 2017-11-08 LAB — COMPREHENSIVE METABOLIC PANEL
ALBUMIN: 3.9 g/dL (ref 3.5–5.0)
ALT: 70 U/L — ABNORMAL HIGH (ref 17–63)
ANION GAP: 10 (ref 5–15)
AST: 58 U/L — ABNORMAL HIGH (ref 15–41)
Alkaline Phosphatase: 50 U/L (ref 38–126)
BILIRUBIN TOTAL: 0.6 mg/dL (ref 0.3–1.2)
BUN: 15 mg/dL (ref 6–20)
CHLORIDE: 106 mmol/L (ref 101–111)
CO2: 26 mmol/L (ref 22–32)
Calcium: 9.9 mg/dL (ref 8.9–10.3)
Creatinine, Ser: 0.74 mg/dL (ref 0.61–1.24)
GFR calc Af Amer: >60 mL/min (ref >60)
GFR calc non Af Amer: >60 mL/min (ref >60)
Glucose, Bld: 160 mg/dL — ABNORMAL HIGH (ref 65–99)
POTASSIUM: 4.1 mmol/L (ref 3.5–5.1)
SODIUM: 142 mmol/L (ref 135–145)
TOTAL PROTEIN: 8 g/dL (ref 6.5–8.1)

## 2017-11-08 LAB — LIPASE, BLOOD: LIPASE: 51 U/L (ref 11–51)

## 2017-11-08 MED ORDER — IOPAMIDOL (ISOVUE-300) INJECTION 61%
100.0000 mL | Freq: Once | INTRAVENOUS | Status: AC | PRN
  Administered 2017-11-08: 100 mL via INTRAVENOUS

## 2017-11-08 MED ORDER — MORPHINE SULFATE (PF) 4 MG/ML IV SOLN
4.0000 mg | Freq: Once | INTRAVENOUS | Status: AC
  Administered 2017-11-08: 4 mg via INTRAVENOUS
  Filled 2017-11-08: qty 1

## 2017-11-08 MED ORDER — ONDANSETRON HCL 4 MG PO TABS: 4.0000 mg | ORAL_TABLET | Freq: Four times a day (QID) | ORAL | 0 refills | Status: DC | PRN

## 2017-11-08 MED ORDER — DICYCLOMINE HCL 20 MG PO TABS: 20.0000 mg | ORAL_TABLET | Freq: Two times a day (BID) | ORAL | 0 refills | Status: DC | PRN

## 2017-11-08 MED ORDER — SODIUM CHLORIDE 0.9 % IV BOLUS
1000.0000 mL | Freq: Once | INTRAVENOUS | Status: AC
  Administered 2017-11-08: 1000 mL via INTRAVENOUS

## 2017-11-08 MED ORDER — DICYCLOMINE HCL 10 MG PO CAPS
20.0000 mg | ORAL_CAPSULE | Freq: Once | ORAL | Status: AC
  Administered 2017-11-08: 20 mg via ORAL
  Filled 2017-11-08: qty 2

## 2017-11-08 NOTE — ED Notes (Signed)
Pt reports his IV came out in CT

## 2017-11-08 NOTE — ED Provider Notes (Signed)
[facility] EMERGENCY DEPARTMENT Provider Note   CSN: [id number] Arrival date & time: 11/08/17  1337     History   Chief Complaint Chief Complaint  Patient presents with  . Abdominal Pain    HPI [name] [name] is a 52 y.o. male.  HPI Patient presents with 3 to 4 days of right lower quadrant abdominal pain.  States of the last day the pain has become worse.  Worse with movement.  Associated with nausea and one episode of loose stool.  Has had subjective fevers and chills.  Decreased appetite today though patient did get up and then a cheese sandwich prior to coming to the emergency department.  Pain does not radiate to the flank or groin.  No urinary symptoms. Past Medical History:  Diagnosis Date  . Arthritis   . Asthma    as child  . Back pain, chronic 02/18/11   BULGING DISK  . Complication of anesthesia    "woke up during spleenectomy at [facility]".  . Diabetes mellitus without complication ([place])   . GERD (gastroesophageal reflux disease)    erosive reflux esophagitis, small HH  . Hemorrhoids   . History of kidney stones   . Hyperlipemia   . Hypertension   . Hypothyroidism   . IBS (irritable bowel syndrome)   . Low serum cortisol level ([facility])    Dr Suzette Battiest  . Lower extremity edema   . Nephrolithiasis 12/19/2012  . Plantar fasciitis   . Restless leg syndrome   . S/P colonoscopy 08/2008   int/ext hemorrhoids. friable, rectal polyp  . S/P endoscopy 01/10/09   Dr Vivi Ferns, small HH  . Sleep apnea    cpap  . Spinal stenosis of lumbar region 12/18/2012  . Thyroid disease   . Tubular adenoma of colon 2000  . UTI (lower urinary tract infection)     Patient Active Problem List   Diagnosis Date Noted  . Rectal bleeding 02/07/2017  . Abdominal hernia 02/07/2017  . FHx: colon cancer 02/07/2017  . Hemorrhoids, complicated 40/97/3532  . Nephrolithiasis 12/19/2012  . Spinal stenosis of lumbar region 12/18/2012  . RBBB 12/17/2012  . Unspecified hypothyroidism 12/17/2012  .  Adrenal insufficiency ([place]) 12/17/2012  . Hyperglycemia, drug-induced 12/17/2012  . Hypokalemia 12/16/2012  . Traumatic rupture of distal biceps tendon 02/20/2012  . Depression 08/22/2011  . Adenomatous polyps 06/29/2011  . Abnormal CT scan, small bowel 03/05/2011  . Fatty liver 03/05/2011  . Pyelonephritis 02/19/2011  . Status post splenectomy 02/19/2011  . History of ITP 02/19/2011  . OSA on CPAP 02/19/2011  . GLOBUS HYSTERICUS 01/05/2009  . Irritable bowel syndrome 01/05/2009  . DYSPHAGIA UNSPECIFIED 01/05/2009  . GERD 01/04/2009  . HIATAL HERNIA 01/04/2009  . HEMATOCHEZIA 01/04/2009  . HEMOPTYSIS 01/04/2009  . ABDOMINAL PAIN 01/04/2009    Past Surgical History:  Procedure Laterality Date  . CHOLECYSTECTOMY  2006  . COLONOSCOPY  2000   Dr. Gala Romney- tubular adenoma  . COLONOSCOPY  08/10/2008   Dr. Gala Romney-  IMPRESSION:  Prominent external anal canal hemorrhoids, hyperplastic polyp  . COLONOSCOPY N/A 10/08/2013   DJM:EQASTMHD colonic polyps -  removed as described above/Prominent grade 3 hemorrhoids -  likely source of hematochezia. tubular adenomas. next TCS 10/2016  . COLONOSCOPY WITH PROPOFOL N/A 02/25/2017   Procedure: COLONOSCOPY WITH PROPOFOL;  Surgeon: Daneil Dolin, MD;  Location: [facility];  Service: Endoscopy;  Laterality: N/A;  8:30am  . CYSTOSCOPY     with utetheral dilation  . DISTAL BICEPS TENDON REPAIR  02/20/2012   Procedure: DISTAL BICEPS TENDON REPAIR;  Surgeon: Lorn Junes, MD;  Location: [place];  Service: Orthopedics;  Laterality: Left;  left elbow distal biceps tendon repair  . ESOPHAGOGASTRODUODENOSCOPY  2010   Dr. Gala Romney: normal  . FLEXIBLE SIGMOIDOSCOPY N/A 11/10/2013   Dr.Rourk- internal/external hemorrhoids. external hemorrhoids grade 3 with some residual prolapsing tissue. normal rectum aside form hemorrhoids. s/p band placement.  Marland Kitchen HEMORRHOID BANDING N/A 11/10/2013   Procedure: HEMORRHOID BANDING;  Surgeon: Daneil Dolin, MD;   Location: [facility];  Service: Endoscopy;  Laterality: N/A;  . HERNIA REPAIR    . INCISIONAL HERNIA REPAIR N/A 04/10/2017   Procedure: Fatima Blank HERNIORRHAPHY WITH MESH;  Surgeon: Aviva Signs, MD;  Location: [facility];  Service: General;  Laterality: N/A;  . KNEE SURGERY     left  . POLYPECTOMY  02/25/2017   Procedure: POLYPECTOMY - ASCENDING COLON;  Surgeon: Daneil Dolin, MD;  Location: [facility];  Service: Endoscopy;;  . SPLENECTOMY  2003   itp        Home Medications    Prior to Admission medications   Medication Sig Start Date End Date Taking? Authorizing Provider  amitriptyline (ELAVIL) 10 MG tablet Take 10 mg by mouth at bedtime.     [provider]  aspirin EC 81 MG tablet Take 81 mg by mouth daily.    [provider]  dicyclomine (BENTYL) 20 MG tablet Take 1 tablet (20 mg total) by mouth 2 (two) times daily as needed for spasms. 11/08/17   Julianne Rice, MD  DULoxetine (CYMBALTA) 60 MG capsule Take 120 mg by mouth daily.     [provider]  glimepiride (AMARYL) 2 MG tablet Take 2 mg by mouth 2 (two) times daily. 10/09/17   [provider]  HYDROcodone-acetaminophen (NORCO) 7.5-325 MG tablet Take 1 tablet every 4 (four) hours as needed by mouth for moderate pain. 04/10/17   Aviva Signs, MD  hydrocortisone (CORTEF) 20 MG tablet Take 0.5 tablets (10 mg total) by mouth daily. For cortisol replacement 08/23/11   Darrol Jump, MD  levothyroxine (SYNTHROID, LEVOTHROID) 112 MCG tablet Take 112 mcg by mouth daily. 10/09/17   [provider]  losartan (COZAAR) 100 MG tablet Take 100 mg by mouth daily. 04/01/17   [provider]  metFORMIN (GLUCOPHAGE) 1000 MG tablet Take 1,000 mg by mouth 2 (two) times daily. 10/21/17   [provider]  Multiple Vitamins-Minerals (MENS MULTIVITAMIN PLUS) TABS Take 1 tablet by mouth daily. /For nutritional supplementation. 08/23/11   Darrol Jump, MD  Omega-3 Fatty Acids (OMEGA-3  FISH OIL PO) Take by mouth.    [provider]  omeprazole (PRILOSEC) 40 MG capsule Take 1 capsule (40 mg total) by mouth daily. 07/16/14   Annitta Needs, NP  ondansetron (ZOFRAN) 4 MG tablet Take 1 tablet (4 mg total) by mouth every 6 (six) hours as needed for nausea or vomiting. 11/08/17   Julianne Rice, MD  simvastatin (ZOCOR) 40 MG tablet Take 1 tablet by mouth daily. 11/08/17   [provider]  sitaGLIPtin (JANUVIA) 100 MG tablet Take 100 mg by mouth daily.    [provider]  tamsulosin (FLOMAX) 0.4 MG CAPS capsule Take 0.4 mg by mouth daily.    [provider]  Vitamin D, Ergocalciferol, (DRISDOL) 50000 units CAPS capsule Take 50,000 Units by mouth once a week. 10/07/17   [provider]    Family History Family History  Problem  Relation Age of Onset  . Colon cancer Father 70       deceased 6 months after diagnosis  . Diabetes Brother     Social History Social History   Tobacco Use  . Smoking status: Former Smoker    Packs/day: 0.50    Years: 30.00    Pack years: 15.00    Types: Cigarettes    Last attempt to quit: 03/05/2007    Years since quitting: 10.6  . Smokeless tobacco: Never Used  Substance Use Topics  . Alcohol use: No    Comment: 2008  . Drug use: No    Comment: 2008     Allergies   Hydromorphone hcl   Review of Systems Review of Systems  Constitutional: Positive for chills, diaphoresis and fever.  Respiratory: Negative for cough and shortness of breath.   Cardiovascular: Negative for chest pain and leg swelling.  Gastrointestinal: Positive for abdominal pain, diarrhea and nausea. Negative for blood in stool, constipation and vomiting.  Genitourinary: Negative for dysuria, flank pain, frequency and hematuria.  Musculoskeletal: Negative for back pain, myalgias, neck pain and neck stiffness.  Skin: Negative for rash and wound.  Neurological: Negative for dizziness, weakness, light-headedness, numbness and  headaches.  All other systems reviewed and are negative.    Physical Exam Updated Vital Signs BP (!) 140/98   Pulse 100   Temp 98.8 F (37.1 C) (Oral)   Resp 16   Ht 5\' 7"  (1.702 m)   Wt 136.1 kg (300 lb)   SpO2 94%   BMI 46.99 kg/m   Physical Exam  Constitutional: He is oriented to person, place, and time. He appears well-developed and well-nourished.  HENT:  Head: Normocephalic and atraumatic.  Mouth/Throat: Oropharynx is clear and moist.  Eyes: Pupils are equal, round, and reactive to light. EOM are normal.  Neck: Normal range of motion. Neck supple.  Cardiovascular: Normal rate and regular rhythm.  Pulmonary/Chest: Effort normal and breath sounds normal.  Abdominal: Soft. Bowel sounds are normal. There is tenderness. There is no rebound and no guarding.  Right lower quadrant tenderness to deep palpation.  No definite rebound or guarding.  Musculoskeletal: Normal range of motion. He exhibits no edema or tenderness.  No CVA tenderness bilaterally.  No midline thoracic or lumbar tenderness.  Neurological: He is alert and oriented to person, place, and time.  Moves all extremities without focal deficit.  Sensation fully intact  Skin: Skin is warm and dry. Capillary refill takes less than 2 seconds. No rash noted. No erythema.  Psychiatric: He has a normal mood and affect. His behavior is normal.  Nursing note and vitals reviewed.    ED Treatments / Results  Labs (all labs ordered are listed, but only abnormal results are displayed) Labs Reviewed  COMPREHENSIVE METABOLIC PANEL - Abnormal; Notable for the following components:      Result Value   Glucose, Bld 160 (*)    AST 58 (*)    ALT 70 (*)    All other components within normal limits  CBC - Abnormal; Notable for the following components:   RDW 15.9 (*)    Platelets 532 (*)    All other components within normal limits  URINALYSIS, ROUTINE W REFLEX MICROSCOPIC - Abnormal; Notable for the following components:    Specific Gravity, Urine 1.033 (*)    All other components within normal limits  LIPASE, BLOOD    EKG None  Radiology Ct Abdomen Pelvis W Contrast  Result Date: 11/08/2017 CLINICAL DATA:  52 year old male with history of right lower quadrant abdominal pain for the past 4 days. EXAM: CT ABDOMEN AND PELVIS WITH CONTRAST TECHNIQUE: Multidetector CT imaging of the abdomen and pelvis was performed using the standard protocol following bolus administration of intravenous contrast. CONTRAST:  124mL ISOVUE-300 IOPAMIDOL (ISOVUE-300) INJECTION 61% COMPARISON:  CT the abdomen and pelvis 12/17/2012. FINDINGS: Lower chest: Atherosclerotic calcifications in the left anterior descending coronary artery. Hepatobiliary: No suspicious cystic or solid hepatic lesions. No intra or extrahepatic biliary ductal dilatation. Status post cholecystectomy. Pancreas: No pancreatic mass. No pancreatic ductal dilatation. No pancreatic or peripancreatic fluid or inflammatory changes. Spleen: Status post splenectomy. Adrenals/Urinary Tract: 2.5 cm simple cyst in the interpolar region of the left kidney. 6 mm nonobstructive calculus in the upper pole collecting system of the left kidney. Right kidney and bilateral adrenal glands are normal in appearance. No hydroureteronephrosis. No ureteral stones. Urinary bladder is normal in appearance. Stomach/Bowel: Normal appearance of the stomach. No pathologic dilatation of small bowel or colon. Normal appendix. Vascular/Lymphatic: Aortic atherosclerosis, without definite aneurysm or dissection in the abdominal or pelvic vasculature. No lymphadenopathy noted in the abdomen or pelvis. Reproductive: Prostate gland and seminal vesicles are unremarkable in appearance. Other: No significant volume of ascites.  No pneumoperitoneum. Musculoskeletal: There are no aggressive appearing lytic or blastic lesions noted in the visualized portions of the skeleton. IMPRESSION: 1. No acute findings are noted in the  abdomen or pelvis to account for the patient's symptoms. Specifically, the appendix is normal. 2. 6 mm nonobstructive calculus in the upper pole collecting system of the left kidney. No ureteral stones or findings of urinary tract obstruction are noted at this time. 3. Aortic atherosclerosis. 4. Additional incidental findings, as above. Aortic Atherosclerosis (ICD10-I70.0). Electronically Signed   By: Vinnie Langton M.D.   On: 11/08/2017 16:18    Procedures Procedures (including critical care time)  Medications Ordered in ED Medications  dicyclomine (BENTYL) capsule 20 mg (has no administration in time range)  sodium chloride 0.9 % bolus 1,000 mL (0 mLs Intravenous Stopped 11/08/17 1651)  morphine 4 MG/ML injection 4 mg (4 mg Intravenous Given 11/08/17 1537)  iopamidol (ISOVUE-300) 61 % injection 100 mL (100 mLs Intravenous Contrast Given 11/08/17 1552)     Initial Impression / Assessment and Plan / ED Course  I have reviewed the triage vital signs and the nursing notes.  Pertinent labs & imaging results that were available during my care of the patient were reviewed by me and considered in my medical decision making (see chart for details).    CT abdomen pelvis without acute findings.  Patient does have a mild elevation in his liver function test.  Wife at bedside states that his liver function tests were very abnormal and has been followed by an endocrinologist. Abdominal exam remains benign.  No peritoneal signs.  Advised follow-up with gastroenterology and have given return precautions.  Patient is voiced understanding.  Final Clinical Impressions(s) / ED Diagnoses   Final diagnoses:  Right lower quadrant abdominal pain  Elevated liver function tests    ED Discharge Orders        Ordered    dicyclomine (BENTYL) 20 MG tablet  2 times daily PRN     11/08/17 1752    ondansetron (ZOFRAN) 4 MG tablet  Every 6 hours PRN     11/08/17 1752       Julianne Rice, MD 11/08/17 1753

## 2017-11-08 NOTE — ED Notes (Signed)
Pt eating supper  Meal provided to spouse

## 2017-11-08 NOTE — ED Triage Notes (Addendum)
Patient complaining of lower right abdominal pain x 4 days. Denies vomiting or diarrhea. Denies dysuria.

## 2017-11-12 ENCOUNTER — Emergency Department (HOSPITAL_COMMUNITY): Payer: BLUE CROSS/BLUE SHIELD

## 2017-11-12 ENCOUNTER — Encounter (HOSPITAL_COMMUNITY): Payer: Self-pay

## 2017-11-12 ENCOUNTER — Emergency Department (HOSPITAL_COMMUNITY)
Admission: EM | Admit: 2017-11-12 | Discharge: 2017-11-12 | Disposition: A | Discharge status: Home / Self Care | Payer: BLUE CROSS/BLUE SHIELD | Attending: Emergency Medicine | Admitting: Emergency Medicine

## 2017-11-12 ENCOUNTER — Other Ambulatory Visit: Payer: Self-pay

## 2017-11-12 DIAGNOSIS — R1032 Left lower quadrant pain: Secondary | ICD-10-CM | POA: Diagnosis not present

## 2017-11-12 DIAGNOSIS — Z79899 Other long term (current) drug therapy: Secondary | ICD-10-CM | POA: Diagnosis not present

## 2017-11-12 DIAGNOSIS — I10 Essential (primary) hypertension: Secondary | ICD-10-CM | POA: Diagnosis not present

## 2017-11-12 DIAGNOSIS — R1031 Right lower quadrant pain: Secondary | ICD-10-CM | POA: Diagnosis not present

## 2017-11-12 DIAGNOSIS — Z87891 Personal history of nicotine dependence: Secondary | ICD-10-CM | POA: Insufficient documentation

## 2017-11-12 DIAGNOSIS — Z7982 Long term (current) use of aspirin: Secondary | ICD-10-CM | POA: Insufficient documentation

## 2017-11-12 DIAGNOSIS — J45909 Unspecified asthma, uncomplicated: Secondary | ICD-10-CM | POA: Insufficient documentation

## 2017-11-12 DIAGNOSIS — R109 Unspecified abdominal pain: Secondary | ICD-10-CM | POA: Diagnosis not present

## 2017-11-12 DIAGNOSIS — Z7984 Long term (current) use of oral hypoglycemic drugs: Secondary | ICD-10-CM | POA: Diagnosis not present

## 2017-11-12 DIAGNOSIS — E119 Type 2 diabetes mellitus without complications: Secondary | ICD-10-CM | POA: Diagnosis not present

## 2017-11-12 DIAGNOSIS — E039 Hypothyroidism, unspecified: Secondary | ICD-10-CM | POA: Insufficient documentation

## 2017-11-12 DIAGNOSIS — R197 Diarrhea, unspecified: Secondary | ICD-10-CM | POA: Diagnosis not present

## 2017-11-12 LAB — COMPREHENSIVE METABOLIC PANEL
ALT: 71 U/L — ABNORMAL HIGH (ref 17–63)
AST: 60 U/L — ABNORMAL HIGH (ref 15–41)
Albumin: 3.8 g/dL (ref 3.5–5.0)
Alkaline Phosphatase: 45 U/L (ref 38–126)
Anion gap: 13 (ref 5–15)
BUN: 13 mg/dL (ref 6–20)
CO2: 23 mmol/L (ref 22–32)
Calcium: 9 mg/dL (ref 8.9–10.3)
Chloride: 105 mmol/L (ref 101–111)
Creatinine, Ser: 0.8 mg/dL (ref 0.61–1.24)
GFR calc Af Amer: >60 mL/min (ref >60)
GFR calc non Af Amer: >60 mL/min (ref >60)
Glucose, Bld: 76 mg/dL (ref 65–99)
Potassium: 3.7 mmol/L (ref 3.5–5.1)
Sodium: 141 mmol/L (ref 135–145)
Total Bilirubin: 0.4 mg/dL (ref 0.3–1.2)
Total Protein: 7.3 g/dL (ref 6.5–8.1)

## 2017-11-12 LAB — URINALYSIS, ROUTINE W REFLEX MICROSCOPIC
BILIRUBIN URINE: NEGATIVE
GLUCOSE, UA: NEGATIVE mg/dL
Hgb urine dipstick: NEGATIVE
KETONES UR: NEGATIVE mg/dL
Leukocytes, UA: NEGATIVE
Nitrite: NEGATIVE
PH: 6 (ref 5.0–8.0)
Protein, ur: NEGATIVE mg/dL
Specific Gravity, Urine: 1.026 (ref 1.005–1.030)

## 2017-11-12 LAB — CBC WITH DIFFERENTIAL/PLATELET
Abs Immature Granulocytes: 0 10*3/uL (ref 0.0–0.1)
Basophils Absolute: 0.1 10*3/uL (ref 0.0–0.1)
Basophils Relative: 1 %
EOS ABS: 0.2 10*3/uL (ref 0.0–0.7)
EOS PCT: 2 %
HEMATOCRIT: 42.4 % (ref 39.0–52.0)
Hemoglobin: 13.4 g/dL (ref 13.0–17.0)
Immature Granulocytes: 0 %
LYMPHS ABS: 3.8 10*3/uL (ref 0.7–4.0)
Lymphocytes Relative: 31 %
MCH: 26.1 pg (ref 26.0–34.0)
MCHC: 31.6 g/dL (ref 30.0–36.0)
MCV: 82.5 fL (ref 78.0–100.0)
MONOS PCT: 7 %
Monocytes Absolute: 0.9 10*3/uL (ref 0.1–1.0)
Neutro Abs: 7.5 10*3/uL (ref 1.7–7.7)
Neutrophils Relative %: 59 %
Platelets: 556 10*3/uL — ABNORMAL HIGH (ref 150–400)
RBC: 5.14 MIL/uL (ref 4.22–5.81)
RDW: 15.9 % — AB (ref 11.5–15.5)
WBC: 12.4 10*3/uL — AB (ref 4.0–10.5)

## 2017-11-12 LAB — LIPASE, BLOOD: Lipase: 43 U/L (ref 11–51)

## 2017-11-12 LAB — POC OCCULT BLOOD, ED: Fecal Occult Bld: POSITIVE — AB

## 2017-11-12 MED ORDER — IOHEXOL 300 MG/ML  SOLN
100.0000 mL | Freq: Once | INTRAMUSCULAR | Status: AC | PRN
  Administered 2017-11-12: 100 mL via INTRAVENOUS

## 2017-11-12 NOTE — ED Notes (Signed)
Pt departed in NAD, refused use of wheelchair.  

## 2017-11-12 NOTE — ED Notes (Signed)
Patient transported to CT 

## 2017-11-12 NOTE — ED Provider Notes (Signed)
[name] EMERGENCY DEPARTMENT Provider Note   CSN: [id number] Arrival date & time: 11/12/17  1028     History   Chief Complaint Chief Complaint  Patient presents with  . Abdominal Pain    HPI [name] [name] is a 52 y.o. male.  Patient is a 52 year old male with a history of diabetes, hypertension, hyperlipidemia, irritable bowel syndrome who presents with worsening right lower quadrant abdominal pain.  He has about a one-week history of pain to his right lower abdomen.  He states it is a constant pain but it waxes and wanes in intensity.  He has had some nausea but no vomiting.  No known fevers.  No urinary symptoms.  He has not felt any knots or lumps.  He denies any radiation of the pain.  He denies any groin pain.  He was seen at [facility] 4 days ago and had labs and a CT scan of his abdomen pelvis.  He had some mild elevation in his LFTs which she has not had before.  He had a CT scan that was negative for acute abnormalities.  He is status post cholecystectomy and splenectomy.  He had a splenectomy status post ITP.  He states since that time his pain is worsened and become more persistent.  He has had 2 episodes of diarrhea.  Today he has noted some blood mixed in with the stool.     Past Medical History:  Diagnosis Date  . Arthritis   . Asthma    as child  . Back pain, chronic 02/18/11   BULGING DISK  . Complication of anesthesia    "woke up during spleenectomy at [facility]".  . Diabetes mellitus without complication ([place])   . GERD (gastroesophageal reflux disease)    erosive reflux esophagitis, small HH  . Hemorrhoids   . History of kidney stones   . Hyperlipemia   . Hypertension   . Hypothyroidism   . IBS (irritable bowel syndrome)   . Low serum cortisol level (HCC)    Dr Suzette Battiest  . Lower extremity edema   . Nephrolithiasis 12/19/2012  . Plantar fasciitis   . Restless leg syndrome   . S/P colonoscopy 08/2008   int/ext hemorrhoids.  friable, rectal polyp  . S/P endoscopy 01/10/09   Dr Vivi Ferns, small HH  . Sleep apnea    cpap  . Spinal stenosis of lumbar region 12/18/2012  . Thyroid disease   . Tubular adenoma of colon 2000  . UTI (lower urinary tract infection)     Patient Active Problem List   Diagnosis Date Noted  . Rectal bleeding 02/07/2017  . Abdominal hernia 02/07/2017  . FHx: colon cancer 02/07/2017  . Hemorrhoids, complicated 03/00/9233  . Nephrolithiasis 12/19/2012  . Spinal stenosis of lumbar region 12/18/2012  . RBBB 12/17/2012  . Unspecified hypothyroidism 12/17/2012  . Adrenal insufficiency ([place]) 12/17/2012  . Hyperglycemia, drug-induced 12/17/2012  . Hypokalemia 12/16/2012  . Traumatic rupture of distal biceps tendon 02/20/2012  . Depression 08/22/2011  . Adenomatous polyps 06/29/2011  . Abnormal CT scan, small bowel 03/05/2011  . Fatty liver 03/05/2011  . Pyelonephritis 02/19/2011  . Status post splenectomy 02/19/2011  . History of ITP 02/19/2011  . OSA on CPAP 02/19/2011  . GLOBUS HYSTERICUS 01/05/2009  . Irritable bowel syndrome 01/05/2009  . DYSPHAGIA UNSPECIFIED 01/05/2009  . GERD 01/04/2009  . HIATAL HERNIA 01/04/2009  . HEMATOCHEZIA 01/04/2009  . HEMOPTYSIS 01/04/2009  . ABDOMINAL PAIN 01/04/2009    Past Surgical History:  Procedure Laterality Date  . CHOLECYSTECTOMY  2006  . COLONOSCOPY  2000   Dr. Gala Romney- tubular adenoma  . COLONOSCOPY  08/10/2008   Dr. Gala Romney-  IMPRESSION:  Prominent external anal canal hemorrhoids, hyperplastic polyp  . COLONOSCOPY N/A 10/08/2013   BJS:EGBTDVVO colonic polyps -  removed as described above/Prominent grade 3 hemorrhoids -  likely source of hematochezia. tubular adenomas. next TCS 10/2016  . COLONOSCOPY WITH PROPOFOL N/A 02/25/2017   Procedure: COLONOSCOPY WITH PROPOFOL;  Surgeon: Daneil Dolin, MD;  Location: [facility];  Service: Endoscopy;  Laterality: N/A;  8:30am  . CYSTOSCOPY     with utetheral dilation  . DISTAL BICEPS TENDON  REPAIR  02/20/2012   Procedure: DISTAL BICEPS TENDON REPAIR;  Surgeon: Lorn Junes, MD;  Location: [facility];  Service: Orthopedics;  Laterality: Left;  left elbow distal biceps tendon repair  . ESOPHAGOGASTRODUODENOSCOPY  2010   Dr. Gala Romney: normal  . FLEXIBLE SIGMOIDOSCOPY N/A 11/10/2013   Dr.Rourk- internal/external hemorrhoids. external hemorrhoids grade 3 with some residual prolapsing tissue. normal rectum aside form hemorrhoids. s/p band placement.  Marland Kitchen HEMORRHOID BANDING N/A 11/10/2013   Procedure: HEMORRHOID BANDING;  Surgeon: Daneil Dolin, MD;  Location: [facility];  Service: Endoscopy;  Laterality: N/A;  . HERNIA REPAIR    . INCISIONAL HERNIA REPAIR N/A 04/10/2017   Procedure: Fatima Blank HERNIORRHAPHY WITH MESH;  Surgeon: Aviva Signs, MD;  Location: [facility];  Service: General;  Laterality: N/A;  . KNEE SURGERY     left  . POLYPECTOMY  02/25/2017   Procedure: POLYPECTOMY - ASCENDING COLON;  Surgeon: Daneil Dolin, MD;  Location: [facility];  Service: Endoscopy;;  . SPLENECTOMY  2003   itp        Home Medications    Prior to Admission medications   Medication Sig Start Date End Date Taking? Authorizing Provider  amitriptyline (ELAVIL) 10 MG tablet Take 10 mg by mouth at bedtime.    Yes [provider]  aspirin EC 81 MG tablet Take 81 mg by mouth daily.   Yes [provider]  DULoxetine (CYMBALTA) 60 MG capsule Take 120 mg by mouth daily.    Yes [provider]  hydrocortisone (CORTEF) 20 MG tablet Take 0.5 tablets (10 mg total) by mouth daily. For cortisol replacement 08/23/11  Yes Darrol Jump, MD  levothyroxine (SYNTHROID, LEVOTHROID) 125 MCG tablet Take 125 mcg by mouth daily.  10/09/17  Yes [provider]  losartan (COZAAR) 100 MG tablet Take 100 mg by mouth daily. 04/01/17  Yes [provider]  metFORMIN (GLUCOPHAGE) 1000 MG tablet Take 1,000 mg by mouth 2 (two) times daily. 10/21/17  Yes [provider]  Multiple Vitamins-Minerals (MENS MULTIVITAMIN PLUS) TABS Take 1 tablet by mouth daily. /For nutritional supplementation. 08/23/11  Yes Darrol Jump, MD  Omega-3 Fatty Acids (OMEGA-3 FISH OIL PO) Take 1 capsule by mouth daily.    Yes [provider]  omeprazole (PRILOSEC) 40 MG capsule Take 1 capsule (40 mg total) by mouth daily. 07/16/14  Yes Annitta Needs, NP  simvastatin (ZOCOR) 40 MG tablet Take 1 tablet by mouth daily. 11/08/17  Yes [provider]  sitaGLIPtin (JANUVIA) 100 MG tablet Take 100 mg by mouth daily.   Yes [provider]  tamsulosin (FLOMAX) 0.4 MG CAPS capsule Take 0.4 mg by mouth daily.   Yes [provider]  Vitamin D, Ergocalciferol, (DRISDOL) 50000 units CAPS capsule Take 50,000 Units by mouth once  a week. 10/07/17  Yes [provider]  dicyclomine (BENTYL) 20 MG tablet Take 1 tablet (20 mg total) by mouth 2 (two) times daily as needed for spasms. Patient not taking: Reported on 11/12/2017 11/08/17   Julianne Rice, MD  HYDROcodone-acetaminophen (NORCO) 7.5-325 MG tablet Take 1 tablet every 4 (four) hours as needed by mouth for moderate pain. Patient not taking: Reported on 11/12/2017 04/10/17   Aviva Signs, MD  ondansetron (ZOFRAN) 4 MG tablet Take 1 tablet (4 mg total) by mouth every 6 (six) hours as needed for nausea or vomiting. Patient not taking: Reported on 11/12/2017 11/08/17   Julianne Rice, MD    Family History Family History  Problem Relation Age of Onset  . Colon cancer Father 85       deceased 6 months after diagnosis  . Diabetes Brother     Social History Social History   Tobacco Use  . Smoking status: Former Smoker    Packs/day: 0.50    Years: 30.00    Pack years: 15.00    Types: Cigarettes    Last attempt to quit: 03/05/2007    Years since quitting: 10.6  . Smokeless tobacco: Never Used  Substance Use Topics  . Alcohol use: No    Comment: 2008  . Drug use: No    Comment: 2008      Allergies   Patient has no known allergies.   Review of Systems Review of Systems  Constitutional: Negative for chills, diaphoresis, fatigue and fever.  HENT: Negative for congestion, rhinorrhea and sneezing.   Eyes: Negative.   Respiratory: Negative for cough, chest tightness and shortness of breath.   Cardiovascular: Negative for chest pain and leg swelling.  Gastrointestinal: Positive for abdominal pain and nausea. Negative for blood in stool, diarrhea and vomiting.  Genitourinary: Negative for difficulty urinating, flank pain, frequency and hematuria.  Musculoskeletal: Negative for arthralgias and back pain.  Skin: Negative for rash.  Neurological: Negative for dizziness, speech difficulty, weakness, numbness and headaches.     Physical Exam Updated Vital Signs BP 132/83 (BP Location: Right Arm)   Pulse 92   Temp 98.2 F (36.8 C) (Oral)   Resp 18   Ht 5\' 7"  (1.702 m)   Wt 136.1 kg (300 lb)   SpO2 97%   BMI 46.99 kg/m   Physical Exam  Constitutional: He is oriented to person, place, and time. He appears well-developed and well-nourished.  HENT:  Head: Normocephalic and atraumatic.  Eyes: Pupils are equal, round, and reactive to light.  Neck: Normal range of motion. Neck supple.  Cardiovascular: Normal rate, regular rhythm and normal heart sounds.  Pulmonary/Chest: Effort normal and breath sounds normal. No respiratory distress. He has no wheezes. He has no rales. He exhibits no tenderness.  Abdominal: Soft. Bowel sounds are normal. There is tenderness in the right lower quadrant. There is no rebound and no guarding.  Musculoskeletal: Normal range of motion. He exhibits no edema.  Lymphadenopathy:    He has no cervical adenopathy.  Neurological: He is alert and oriented to person, place, and time.  Skin: Skin is warm and dry. No rash noted.  Psychiatric: He has a normal mood and affect.     ED Treatments / Results  Labs (all labs ordered are listed, but  only abnormal results are displayed) Labs Reviewed  COMPREHENSIVE METABOLIC PANEL - Abnormal; Notable for the following components:      Result Value   AST 60 (*)    ALT 71 (*)  All other components within normal limits  CBC WITH DIFFERENTIAL/PLATELET - Abnormal; Notable for the following components:   WBC 12.4 (*)    RDW 15.9 (*)    Platelets 556 (*)    All other components within normal limits  URINALYSIS, ROUTINE W REFLEX MICROSCOPIC - Abnormal; Notable for the following components:   Color, Urine AMBER (*)    APPearance HAZY (*)    All other components within normal limits  POC OCCULT BLOOD, ED - Abnormal; Notable for the following components:   Fecal Occult Bld POSITIVE (*)    All other components within normal limits  LIPASE, BLOOD    EKG None  Radiology Ct Abdomen Pelvis W Contrast  Result Date: 11/12/2017 CLINICAL DATA:  Right lower quadrant abdominal pain with nausea and diarrhea for 3 days. History of nephrolithiasis. EXAM: CT ABDOMEN AND PELVIS WITH CONTRAST TECHNIQUE: Multidetector CT imaging of the abdomen and pelvis was performed using the standard protocol following bolus administration of intravenous contrast. CONTRAST:  130mL OMNIPAQUE IOHEXOL 300 MG/ML  SOLN COMPARISON:  11/08/2017 FINDINGS: Lower chest: Unremarkable Hepatobiliary: Cholecystectomy.  Otherwise unremarkable. Pancreas: Unremarkable Spleen: Splenectomy Adrenals/Urinary Tract: 5 mm nonobstructive left kidney upper pole calculus, image 114/6. Hypodense 2.2 by 2.1 by 2.0 cm lesion in the right mid kidney has an internal density of 6 Hounsfield units on portal venous phase images and 6 Hounsfield units on delayed phase images, probably a cyst although precontrast comparison images are not available. On the prior MRI from 04/12/2011 this lesion was present in demonstrated no enhancement on the subtraction images. Adrenal glands normal. Stomach/Bowel: The appendix appears unremarkable. Are some air fluid levels  in the distal colon supportive of a diarrheal process. No dilated bowel. Vascular/Lymphatic: Aortoiliac atherosclerotic vascular disease. Right external iliac node 1.0 cm in short axis, image 88/3, stable from 2014. Reproductive: Unremarkable Other: No supplemental non-categorized findings. Musculoskeletal: Bilateral foraminal impingement is suspected at L4-5 primarily due to spurring. IMPRESSION: 1. Distal air fluid levels in the colon favoring a diarrheal process. 2. Bilateral foraminal impingement at L4-5 due to spurring. 3. Nonobstructive 5 mm left kidney upper pole calculus. 4.  Aortic Atherosclerosis (ICD10-I70.0). 5. Borderline enlarged right external iliac lymph node, chronically stable is a hence likely benign. Electronically Signed   By: Van Clines M.D.   On: 11/12/2017 20:45    Procedures Procedures (including critical care time)  Medications Ordered in ED Medications  iohexol (OMNIPAQUE) 300 MG/ML solution 100 mL (100 mLs Intravenous Contrast Given 11/12/17 2024)     Initial Impression / Assessment and Plan / ED Course  I have reviewed the triage vital signs and the nursing notes.  Pertinent labs & imaging results that were available during my care of the patient were reviewed by me and considered in my medical decision making (see chart for details).     Patient is a 53 year old male who presents with right lower quadrant abdominal pain.  He did have a white count that was more elevated than on his previous ED visit.  Given that it is worsening pain, I did repeat a CT scan which shows no acute abnormalities other than some fluid in the colon which could be from diarrheal disease.  He has had no further episodes of diarrhea or blood in his stool in the ED.  He is heme occult was positive but his hemoglobin is stable.  He has very minimal elevation in his LFTs which is similar to his values that were obtained several days ago at [facility]  [place].  His other labs are non-concerning.  He  denies any for any pain medication.  I encouraged him to follow-up with gastroenterology.  He has had a hard time getting into the gastroneurologist with the [facility] hospital.  I gave him referral to follow-up with [facility].  I also encouraged him to follow-up with his primary care physician.  Return precautions were given.  Final Clinical Impressions(s) / ED Diagnoses   Final diagnoses:  Right lower quadrant abdominal pain    ED Discharge Orders    None       Malvin Johns, MD 11/12/17 2245

## 2017-11-12 NOTE — ED Triage Notes (Signed)
Pt endorses RLQ pain with nausea and bloody diarrhea x 1 week, went to [facility]  Friday and had a negative CT scan and was d/c. VSS

## 2017-11-25 DIAGNOSIS — E119 Type 2 diabetes mellitus without complications: Secondary | ICD-10-CM | POA: Diagnosis not present

## 2017-11-25 DIAGNOSIS — E890 Postprocedural hypothyroidism: Secondary | ICD-10-CM | POA: Diagnosis not present

## 2017-11-25 DIAGNOSIS — I10 Essential (primary) hypertension: Secondary | ICD-10-CM | POA: Diagnosis not present

## 2018-02-05 DIAGNOSIS — Z0000 Encounter for general adult medical examination without abnormal findings: Secondary | ICD-10-CM | POA: Diagnosis not present

## 2018-02-05 DIAGNOSIS — Z6841 Body Mass Index (BMI) 40.0 and over, adult: Secondary | ICD-10-CM | POA: Diagnosis not present

## 2018-02-05 DIAGNOSIS — Z1389 Encounter for screening for other disorder: Secondary | ICD-10-CM | POA: Diagnosis not present

## 2018-02-05 DIAGNOSIS — I10 Essential (primary) hypertension: Secondary | ICD-10-CM | POA: Diagnosis not present

## 2018-02-05 DIAGNOSIS — E1165 Type 2 diabetes mellitus with hyperglycemia: Secondary | ICD-10-CM | POA: Diagnosis not present

## 2018-02-06 ENCOUNTER — Ambulatory Visit: Payer: BLUE CROSS/BLUE SHIELD | Admitting: Gastroenterology

## 2018-02-06 ENCOUNTER — Encounter

## 2018-02-12 DIAGNOSIS — G4733 Obstructive sleep apnea (adult) (pediatric): Secondary | ICD-10-CM | POA: Diagnosis not present

## 2018-02-17 DIAGNOSIS — Z6841 Body Mass Index (BMI) 40.0 and over, adult: Secondary | ICD-10-CM | POA: Diagnosis not present

## 2018-02-17 DIAGNOSIS — Z1389 Encounter for screening for other disorder: Secondary | ICD-10-CM | POA: Diagnosis not present

## 2019-10-09 ENCOUNTER — Ambulatory Visit (INDEPENDENT_AMBULATORY_CARE_PROVIDER_SITE_OTHER): Payer: Self-pay

## 2019-10-09 ENCOUNTER — Ambulatory Visit
Admission: EM | Admit: 2019-10-09 | Discharge: 2019-10-09 | Disposition: A | Discharge status: Home / Self Care | Payer: BLUE CROSS/BLUE SHIELD | Attending: Emergency Medicine | Admitting: Emergency Medicine

## 2019-10-09 ENCOUNTER — Other Ambulatory Visit: Payer: Self-pay

## 2019-10-09 DIAGNOSIS — S2231XA Fracture of one rib, right side, initial encounter for closed fracture: Secondary | ICD-10-CM

## 2019-10-09 DIAGNOSIS — R0781 Pleurodynia: Secondary | ICD-10-CM

## 2019-10-09 DIAGNOSIS — R0789 Other chest pain: Secondary | ICD-10-CM

## 2019-10-09 MED ORDER — IBUPROFEN 800 MG PO TABS: 800.0000 mg | ORAL_TABLET | Freq: Three times a day (TID) | ORAL | 0 refills | Status: DC

## 2019-10-09 MED ORDER — TRAMADOL HCL 50 MG PO TABS: 50.0000 mg | ORAL_TABLET | Freq: Two times a day (BID) | ORAL | 0 refills | Status: AC

## 2019-10-09 NOTE — ED Triage Notes (Signed)
Pt had fall from porch has right side pain

## 2019-10-09 NOTE — Discharge Instructions (Addendum)
Take ibuprofen as prescribed for moderate pain Take tramadol for severe pain Follow-up with orthopedic Return or go to ED for worsening symptoms

## 2019-10-10 NOTE — ED Provider Notes (Signed)
[facility]    CSN: [id number] Arrival date & time: 10/09/19  1952      History   Chief Complaint Right side chest wall  pain  HPI [name] [name] is a 54 y.o. male.   Who presented to the urgent care with a complaint of right side chest pain for the past 1 day.  Report he fell from a porch and hit the right side of the chest to the ground.  Localized pain to the right side of his chest.  He described the pain as constant achy, rated at 8 on a scale of 1-10.  Symptoms are made worse with respiration.  Has not tried any OTC medication.  Denies similar symptoms in the past.  Denies chills, fever, nausea, vomiting, diarrhea, shortness of breath, confusionI  The history is provided by the patient. No language interpreter was used.    Past Medical History:  Diagnosis Date  . Arthritis   . Asthma    as child  . Back pain, chronic 02/18/11   BULGING DISK  . Complication of anesthesia    "woke up during spleenectomy at [facility]".  . Diabetes mellitus without complication ([place])   . GERD (gastroesophageal reflux disease)    erosive reflux esophagitis, small HH  . Hemorrhoids   . History of kidney stones   . Hyperlipemia   . Hypertension   . Hypothyroidism   . IBS (irritable bowel syndrome)   . Low serum cortisol level ([facility])    Dr Suzette Battiest  . Lower extremity edema   . Nephrolithiasis 12/19/2012  . Plantar fasciitis   . Restless leg syndrome   . S/P colonoscopy 08/2008   int/ext hemorrhoids. friable, rectal polyp  . S/P endoscopy 01/10/09   Dr Vivi Ferns, small HH  . Sleep apnea    cpap  . Spinal stenosis of lumbar region 12/18/2012  . Thyroid disease   . Tubular adenoma of colon 2000  . UTI (lower urinary tract infection)     Patient Active Problem List   Diagnosis Date Noted  . Rectal bleeding 02/07/2017  . Abdominal hernia 02/07/2017  . FHx: colon cancer 02/07/2017  . Hemorrhoids, complicated [id number]  . Nephrolithiasis 12/19/2012  . Spinal stenosis of lumbar  region 12/18/2012  . RBBB 12/17/2012  . Unspecified hypothyroidism 12/17/2012  . Adrenal insufficiency ([place]) 12/17/2012  . Hyperglycemia, drug-induced 12/17/2012  . Hypokalemia 12/16/2012  . Traumatic rupture of distal biceps tendon 02/20/2012  . Depression 08/22/2011  . Adenomatous polyps 06/29/2011  . Abnormal CT scan, small bowel 03/05/2011  . Fatty liver 03/05/2011  . Pyelonephritis 02/19/2011  . Status post splenectomy 02/19/2011  . History of ITP 02/19/2011  . OSA on CPAP 02/19/2011  . GLOBUS HYSTERICUS 01/05/2009  . Irritable bowel syndrome 01/05/2009  . DYSPHAGIA UNSPECIFIED 01/05/2009  . GERD 01/04/2009  . HIATAL HERNIA 01/04/2009  . HEMATOCHEZIA 01/04/2009  . HEMOPTYSIS 01/04/2009  . ABDOMINAL PAIN 01/04/2009    Past Surgical History:  Procedure Laterality Date  . CHOLECYSTECTOMY  2006  . COLONOSCOPY  2000   Dr. Gala Romney- tubular adenoma  . COLONOSCOPY  08/10/2008   Dr. Gala Romney-  IMPRESSION:  Prominent external anal canal hemorrhoids, hyperplastic polyp  . COLONOSCOPY N/A 10/08/2013   [id number] colonic polyps -  removed as described above/Prominent grade 3 hemorrhoids -  likely source of hematochezia. tubular adenomas. next TCS 10/2016  . COLONOSCOPY WITH PROPOFOL N/A 02/25/2017   Procedure: COLONOSCOPY WITH PROPOFOL;  Surgeon: Daneil Dolin, MD;  Location: [facility];  Service: Endoscopy;  Laterality: N/A;  8:30am  . CYSTOSCOPY     with utetheral dilation  . DISTAL BICEPS TENDON REPAIR  02/20/2012   Procedure: DISTAL BICEPS TENDON REPAIR;  Surgeon: Lorn Junes, MD;  Location: [place];  Service: Orthopedics;  Laterality: Left;  left elbow distal biceps tendon repair  . ESOPHAGOGASTRODUODENOSCOPY  2010   Dr. Gala Romney: normal  . FLEXIBLE SIGMOIDOSCOPY N/A 11/10/2013   Dr.Rourk- internal/external hemorrhoids. external hemorrhoids grade 3 with some residual prolapsing tissue. normal rectum aside form hemorrhoids. s/p band placement.  Marland Kitchen HEMORRHOID BANDING  N/A 11/10/2013   Procedure: HEMORRHOID BANDING;  Surgeon: Daneil Dolin, MD;  Location: [facility];  Service: Endoscopy;  Laterality: N/A;  . HERNIA REPAIR    . INCISIONAL HERNIA REPAIR N/A 04/10/2017   Procedure: Fatima Blank HERNIORRHAPHY WITH MESH;  Surgeon: Aviva Signs, MD;  Location: [facility];  Service: General;  Laterality: N/A;  . KNEE SURGERY     left  . POLYPECTOMY  02/25/2017   Procedure: POLYPECTOMY - ASCENDING COLON;  Surgeon: Daneil Dolin, MD;  Location: [facility];  Service: Endoscopy;;  . SPLENECTOMY  2003   itp       Home Medications    Prior to Admission medications   Medication Sig Start Date End Date Taking? Authorizing Provider  amitriptyline (ELAVIL) 10 MG tablet Take 10 mg by mouth at bedtime.     [provider]  aspirin EC 81 MG tablet Take 81 mg by mouth daily.    [provider]  dicyclomine (BENTYL) 20 MG tablet Take 1 tablet (20 mg total) by mouth 2 (two) times daily as needed for spasms. Patient not taking: Reported on 11/12/2017 11/08/17   Julianne Rice, MD  DULoxetine (CYMBALTA) 60 MG capsule Take 120 mg by mouth daily.     [provider]  HYDROcodone-acetaminophen (NORCO) 7.5-325 MG tablet Take 1 tablet every 4 (four) hours as needed by mouth for moderate pain. Patient not taking: Reported on 11/12/2017 04/10/17   Aviva Signs, MD  hydrocortisone (CORTEF) 20 MG tablet Take 0.5 tablets (10 mg total) by mouth daily. For cortisol replacement 08/23/11   Darrol Jump, MD  ibuprofen (ADVIL) 800 MG tablet Take 1 tablet (800 mg total) by mouth 3 (three) times daily. 10/09/19   Gaytha Raybourn, Darrelyn Hillock, FNP  levothyroxine (SYNTHROID, LEVOTHROID) 125 MCG tablet Take 125 mcg by mouth daily.  10/09/17   [provider]  losartan (COZAAR) 100 MG tablet Take 100 mg by mouth daily. 04/01/17   [provider]  metFORMIN (GLUCOPHAGE) 1000 MG tablet Take 1,000 mg by mouth 2 (two) times daily. 10/21/17   [provider]    Multiple Vitamins-Minerals (MENS MULTIVITAMIN PLUS) TABS Take 1 tablet by mouth daily. /For nutritional supplementation. 08/23/11   Darrol Jump, MD  Omega-3 Fatty Acids (OMEGA-3 FISH OIL PO) Take 1 capsule by mouth daily.     [provider]  omeprazole (PRILOSEC) 40 MG capsule Take 1 capsule (40 mg total) by mouth daily. 07/16/14   Annitta Needs, NP  ondansetron (ZOFRAN) 4 MG tablet Take 1 tablet (4 mg total) by mouth every 6 (six) hours as needed for nausea or vomiting. Patient not taking: Reported on 11/12/2017 11/08/17   Julianne Rice, MD  simvastatin (ZOCOR) 40 MG tablet Take 1 tablet by mouth daily. 11/08/17   [provider]  sitaGLIPtin (JANUVIA) 100 MG tablet Take 100 mg by mouth daily.    [provider]  tamsulosin (FLOMAX) 0.4 MG CAPS capsule Take 0.4 mg by mouth daily.    [provider]  traMADol (ULTRAM) 50 MG tablet Take 1 tablet (50 mg total) by mouth 2 (two) times daily for 5 days. 10/09/19 10/14/19  Karyn Brull, Darrelyn Hillock, FNP  Vitamin D, Ergocalciferol, (DRISDOL) 50000 units CAPS capsule Take 50,000 Units by mouth once a week. 10/07/17   [provider]    Family History Family History  Problem Relation Age of Onset  . Colon cancer Father 44       deceased 6 months after diagnosis  . Diabetes Brother     Social History Social History   Tobacco Use  . Smoking status: Former Smoker    Packs/day: 0.50    Years: 30.00    Pack years: 15.00    Types: Cigarettes    Quit date: 03/05/2007    Years since quitting: 12.6  . Smokeless tobacco: Never Used  Substance Use Topics  . Alcohol use: No    Comment: 2008  . Drug use: No    Comment: 2008     Allergies   Patient has no known allergies.   Review of Systems Review of Systems  Constitutional: Negative.   Respiratory: Negative.   Cardiovascular: Negative.   Musculoskeletal: Positive for arthralgias.  All other systems reviewed and are negative.    Physical Exam Triage  Vital Signs ED Triage Vitals  Enc Vitals Group     BP 10/09/19 1954 (!) 152/91     Pulse Rate 10/09/19 1954 95     Resp 10/09/19 1954 17     Temp 10/09/19 1954 97.8 F (36.6 C)     Temp Source 10/09/19 1954 Oral     SpO2 10/09/19 1954 98 %     Weight --      Height --      Head Circumference --      Peak Flow --      Pain Score 10/09/19 2011 8     Pain Loc --      Pain Edu? --      Excl. in [place]? --    No data found.  Updated Vital Signs BP (!) 152/91 (BP Location: Right Arm)   Pulse 95   Temp 97.8 F (36.6 C) (Oral)   Resp 17   SpO2 98%   Visual Acuity Right Eye Distance:   Left Eye Distance:   Bilateral Distance:    Right Eye Near:   Left Eye Near:    Bilateral Near:     Physical Exam Vitals and nursing note reviewed.  Constitutional:      General: He is not in acute distress.    Appearance: Normal appearance. He is normal weight. He is not ill-appearing, toxic-appearing or diaphoretic.  Cardiovascular:     Rate and Rhythm: Normal rate and regular rhythm.     Pulses: Normal pulses.     Heart sounds: Normal heart sounds. No murmur. No friction rub. No gallop.   Pulmonary:     Effort: Pulmonary effort is normal. No respiratory distress.     Breath sounds: Normal breath sounds. No stridor. No wheezing, rhonchi or rales.  Chest:     Chest wall: No tenderness.  Musculoskeletal:        General: Tenderness present.     Comments: Right-sided chest wall tenderness to palpation.  There is no visible deformity, laceration, open wound.  Respirations are within normal limits.  No dyspnea, orthopnea present  Neurological:  Mental Status: He is alert.      UC Treatments / Results  Labs (all labs ordered are listed, but only abnormal results are displayed) Labs Reviewed - No data to display  EKG   Radiology DG Ribs Unilateral W/Chest Right  Result Date: 10/09/2019 CLINICAL DATA:  [place] off porch rib pain EXAM: RIGHT RIBS AND CHEST - 3+ VIEW COMPARISON:   08/10/2016 FINDINGS: Single-view chest demonstrates low lung volumes. Chronic elevation of right diaphragm. Right rib series demonstrates probable acute right ninth anterolateral rib fracture. IMPRESSION: 1. Negative for pneumothorax or pleural effusion 2. Probable acute right ninth rib fracture Electronically Signed   By: Donavan Foil M.D.   On: 10/09/2019 20:25    Procedures Procedures (including critical care time)  Medications Ordered in UC Medications - No data to display  Initial Impression / Assessment and Plan / UC Course  I have reviewed the triage vital signs and the nursing notes.  Pertinent labs & imaging results that were available during my care of the patient were reviewed by me and considered in my medical decision making (see chart for details).   Patient is stable for discharge and in no acute distress.  Right chest x-ray is positive for acute right ninth rib fracture.  I have reviewed the x-ray myself and the radiologist interpretation.  I am in agreement with the radiologist interpretation.  He was advised to follow-up with orthopedic.  Pain likely from the ninth rib fracture.  Tramadol was prescribed for severe pain and ibuprofen for moderate pain.   Final Clinical Impressions(s) / UC Diagnoses   Final diagnoses:  Closed fracture of one rib of right side, initial encounter  Right-sided chest wall pain     Discharge Instructions     Take ibuprofen as prescribed for moderate pain Take tramadol for severe pain Follow-up with orthopedic Return or go to ED for worsening symptoms    ED Prescriptions    Medication Sig Dispense Auth. Provider   ibuprofen (ADVIL) 800 MG tablet Take 1 tablet (800 mg total) by mouth 3 (three) times daily. 30 tablet Kinya Meine, Darrelyn Hillock, FNP   traMADol (ULTRAM) 50 MG tablet Take 1 tablet (50 mg total) by mouth 2 (two) times daily for 5 days. 10 tablet Amire Leazer, Darrelyn Hillock, FNP     I have reviewed the PDMP during this encounter.     Emerson Monte, [place] 10/10/19 [phone]

## 2019-10-24 ENCOUNTER — Emergency Department (HOSPITAL_COMMUNITY): Payer: Self-pay

## 2019-10-24 ENCOUNTER — Encounter (HOSPITAL_COMMUNITY): Payer: Self-pay | Admitting: Emergency Medicine

## 2019-10-24 ENCOUNTER — Inpatient Hospital Stay (HOSPITAL_COMMUNITY)
Admission: EM | Admit: 2019-10-24 | Discharge: 2019-10-27 | DRG: 690 | Disposition: A | Discharge status: Home / Self Care | Payer: Self-pay | Attending: Internal Medicine | Admitting: Internal Medicine

## 2019-10-24 ENCOUNTER — Other Ambulatory Visit: Payer: Self-pay

## 2019-10-24 ENCOUNTER — Ambulatory Visit
Admission: EM | Admit: 2019-10-24 | Discharge: 2019-10-24 | Disposition: A | Discharge status: Other Acute Inpatient Hospital | Payer: Self-pay

## 2019-10-24 DIAGNOSIS — N10 Acute tubulo-interstitial nephritis: Principal | ICD-10-CM | POA: Diagnosis present

## 2019-10-24 DIAGNOSIS — F329 Major depressive disorder, single episode, unspecified: Secondary | ICD-10-CM | POA: Diagnosis present

## 2019-10-24 DIAGNOSIS — K219 Gastro-esophageal reflux disease without esophagitis: Secondary | ICD-10-CM | POA: Diagnosis present

## 2019-10-24 DIAGNOSIS — I10 Essential (primary) hypertension: Secondary | ICD-10-CM | POA: Diagnosis present

## 2019-10-24 DIAGNOSIS — N200 Calculus of kidney: Secondary | ICD-10-CM | POA: Diagnosis present

## 2019-10-24 DIAGNOSIS — R1011 Right upper quadrant pain: Secondary | ICD-10-CM | POA: Diagnosis present

## 2019-10-24 DIAGNOSIS — Z800 Family history of malignant neoplasm of digestive organs: Secondary | ICD-10-CM

## 2019-10-24 DIAGNOSIS — K760 Fatty (change of) liver, not elsewhere classified: Secondary | ICD-10-CM | POA: Diagnosis present

## 2019-10-24 DIAGNOSIS — R531 Weakness: Secondary | ICD-10-CM

## 2019-10-24 DIAGNOSIS — Z20822 Contact with and (suspected) exposure to covid-19: Secondary | ICD-10-CM | POA: Diagnosis present

## 2019-10-24 DIAGNOSIS — K921 Melena: Secondary | ICD-10-CM | POA: Diagnosis present

## 2019-10-24 DIAGNOSIS — R079 Chest pain, unspecified: Secondary | ICD-10-CM

## 2019-10-24 DIAGNOSIS — Z7952 Long term (current) use of systemic steroids: Secondary | ICD-10-CM

## 2019-10-24 DIAGNOSIS — Z79899 Other long term (current) drug therapy: Secondary | ICD-10-CM

## 2019-10-24 DIAGNOSIS — Z791 Long term (current) use of non-steroidal anti-inflammatories (NSAID): Secondary | ICD-10-CM

## 2019-10-24 DIAGNOSIS — Z9081 Acquired absence of spleen: Secondary | ICD-10-CM

## 2019-10-24 DIAGNOSIS — E876 Hypokalemia: Secondary | ICD-10-CM | POA: Diagnosis present

## 2019-10-24 DIAGNOSIS — B9620 Unspecified Escherichia coli [E. coli] as the cause of diseases classified elsewhere: Secondary | ICD-10-CM | POA: Diagnosis present

## 2019-10-24 DIAGNOSIS — Z6841 Body Mass Index (BMI) 40.0 and over, adult: Secondary | ICD-10-CM

## 2019-10-24 DIAGNOSIS — E039 Hypothyroidism, unspecified: Secondary | ICD-10-CM | POA: Diagnosis present

## 2019-10-24 DIAGNOSIS — Z833 Family history of diabetes mellitus: Secondary | ICD-10-CM

## 2019-10-24 DIAGNOSIS — Z79890 Hormone replacement therapy (postmenopausal): Secondary | ICD-10-CM

## 2019-10-24 DIAGNOSIS — Z9181 History of falling: Secondary | ICD-10-CM

## 2019-10-24 DIAGNOSIS — K648 Other hemorrhoids: Secondary | ICD-10-CM | POA: Diagnosis present

## 2019-10-24 DIAGNOSIS — N390 Urinary tract infection, site not specified: Secondary | ICD-10-CM | POA: Diagnosis present

## 2019-10-24 DIAGNOSIS — D509 Iron deficiency anemia, unspecified: Secondary | ICD-10-CM | POA: Diagnosis present

## 2019-10-24 DIAGNOSIS — Z87891 Personal history of nicotine dependence: Secondary | ICD-10-CM

## 2019-10-24 DIAGNOSIS — Z7982 Long term (current) use of aspirin: Secondary | ICD-10-CM

## 2019-10-24 DIAGNOSIS — D72829 Elevated white blood cell count, unspecified: Secondary | ICD-10-CM

## 2019-10-24 DIAGNOSIS — E1165 Type 2 diabetes mellitus with hyperglycemia: Secondary | ICD-10-CM | POA: Diagnosis present

## 2019-10-24 DIAGNOSIS — Z86010 Personal history of colonic polyps: Secondary | ICD-10-CM

## 2019-10-24 DIAGNOSIS — Z87442 Personal history of urinary calculi: Secondary | ICD-10-CM

## 2019-10-24 DIAGNOSIS — Z8241 Family history of sudden cardiac death: Secondary | ICD-10-CM

## 2019-10-24 DIAGNOSIS — G894 Chronic pain syndrome: Secondary | ICD-10-CM | POA: Diagnosis present

## 2019-10-24 DIAGNOSIS — Z7984 Long term (current) use of oral hypoglycemic drugs: Secondary | ICD-10-CM

## 2019-10-24 DIAGNOSIS — G4733 Obstructive sleep apnea (adult) (pediatric): Secondary | ICD-10-CM | POA: Diagnosis present

## 2019-10-24 DIAGNOSIS — Z9049 Acquired absence of other specified parts of digestive tract: Secondary | ICD-10-CM

## 2019-10-24 DIAGNOSIS — Z8719 Personal history of other diseases of the digestive system: Secondary | ICD-10-CM

## 2019-10-24 DIAGNOSIS — E2740 Unspecified adrenocortical insufficiency: Secondary | ICD-10-CM | POA: Diagnosis present

## 2019-10-24 DIAGNOSIS — N12 Tubulo-interstitial nephritis, not specified as acute or chronic: Secondary | ICD-10-CM | POA: Diagnosis present

## 2019-10-24 DIAGNOSIS — T380X5A Adverse effect of glucocorticoids and synthetic analogues, initial encounter: Secondary | ICD-10-CM | POA: Diagnosis present

## 2019-10-24 DIAGNOSIS — E785 Hyperlipidemia, unspecified: Secondary | ICD-10-CM | POA: Diagnosis present

## 2019-10-24 LAB — SARS CORONAVIRUS 2 BY RT PCR (HOSPITAL ORDER, PERFORMED IN ~~LOC~~ HOSPITAL LAB): SARS Coronavirus 2: NEGATIVE

## 2019-10-24 LAB — CBC
HCT: 40.2 % (ref 39.0–52.0)
Hemoglobin: 12.5 g/dL — ABNORMAL LOW (ref 13.0–17.0)
MCH: 24.9 pg — ABNORMAL LOW (ref 26.0–34.0)
MCHC: 31.1 g/dL (ref 30.0–36.0)
MCV: 79.9 fL — ABNORMAL LOW (ref 80.0–100.0)
Platelets: 527 10*3/uL — ABNORMAL HIGH (ref 150–400)
RBC: 5.03 MIL/uL (ref 4.22–5.81)
RDW: 17.9 % — ABNORMAL HIGH (ref 11.5–15.5)
WBC: 18.4 10*3/uL — ABNORMAL HIGH (ref 4.0–10.5)
nRBC: 0 % (ref 0.0–0.2)

## 2019-10-24 LAB — TYPE AND SCREEN
ABO/RH(D): A POS
Antibody Screen: NEGATIVE

## 2019-10-24 LAB — COMPREHENSIVE METABOLIC PANEL
ALT: 40 U/L (ref 0–44)
AST: 25 U/L (ref 15–41)
Albumin: 3.7 g/dL (ref 3.5–5.0)
Alkaline Phosphatase: 55 U/L (ref 38–126)
Anion gap: 11 (ref 5–15)
BUN: 12 mg/dL (ref 6–20)
CO2: 24 mmol/L (ref 22–32)
Calcium: 8.9 mg/dL (ref 8.9–10.3)
Chloride: 98 mmol/L (ref 98–111)
Creatinine, Ser: 0.69 mg/dL (ref 0.61–1.24)
GFR calc Af Amer: >60 mL/min (ref >60)
GFR calc non Af Amer: >60 mL/min (ref >60)
Glucose, Bld: 289 mg/dL — ABNORMAL HIGH (ref 70–99)
Potassium: 3.9 mmol/L (ref 3.5–5.1)
Sodium: 133 mmol/L — ABNORMAL LOW (ref 135–145)
Total Bilirubin: 0.5 mg/dL (ref 0.3–1.2)
Total Protein: 7.6 g/dL (ref 6.5–8.1)

## 2019-10-24 LAB — LIPASE, BLOOD: Lipase: 18 U/L (ref 11–51)

## 2019-10-24 LAB — CBG MONITORING, ED: Glucose-Capillary: 188 mg/dL — ABNORMAL HIGH (ref 70–99)

## 2019-10-24 LAB — URINALYSIS, ROUTINE W REFLEX MICROSCOPIC
Bilirubin Urine: NEGATIVE
Glucose, UA: >=500 mg/dL — AB
Hgb urine dipstick: NEGATIVE
Ketones, ur: NEGATIVE mg/dL
Leukocytes,Ua: NEGATIVE
Nitrite: POSITIVE — AB
Protein, ur: NEGATIVE mg/dL
Specific Gravity, Urine: >1.046 — ABNORMAL HIGH (ref 1.005–1.030)
pH: 6 (ref 5.0–8.0)

## 2019-10-24 LAB — PROTIME-INR
INR: 1 (ref 0.8–1.2)
Prothrombin Time: 12.6 seconds (ref 11.4–15.2)

## 2019-10-24 LAB — POC OCCULT BLOOD, ED: Fecal Occult Bld: NEGATIVE

## 2019-10-24 MED ORDER — ACETAMINOPHEN 650 MG RE SUPP: 650.0000 mg | Freq: Four times a day (QID) | RECTAL | Status: DC | PRN

## 2019-10-24 MED ORDER — SODIUM CHLORIDE 0.9 % IV SOLN
1.0000 g | Freq: Once | INTRAVENOUS | Status: AC
  Administered 2019-10-24: 1 g via INTRAVENOUS
  Filled 2019-10-24: qty 10

## 2019-10-24 MED ORDER — SODIUM CHLORIDE 0.9 % IV SOLN: INTRAVENOUS | Status: DC

## 2019-10-24 MED ORDER — HYDROMORPHONE HCL 1 MG/ML IJ SOLN
1.0000 mg | Freq: Once | INTRAMUSCULAR | Status: AC
Start: 2019-10-24 — End: 2019-10-24
  Administered 2019-10-24: 1 mg via INTRAVENOUS
  Filled 2019-10-24: qty 1

## 2019-10-24 MED ORDER — SODIUM CHLORIDE 0.9 % IV BOLUS
500.0000 mL | Freq: Once | INTRAVENOUS | Status: AC
  Administered 2019-10-24: 500 mL via INTRAVENOUS

## 2019-10-24 MED ORDER — IOHEXOL 300 MG/ML  SOLN
100.0000 mL | Freq: Once | INTRAMUSCULAR | Status: AC | PRN
  Administered 2019-10-24: 100 mL via INTRAVENOUS

## 2019-10-24 MED ORDER — SODIUM CHLORIDE 0.9 % IV SOLN
1.0000 g | INTRAVENOUS | Status: DC
  Administered 2019-10-25 – 2019-10-26 (×2): 1 g via INTRAVENOUS
  Filled 2019-10-24 (×2): qty 10

## 2019-10-24 MED ORDER — ONDANSETRON HCL 4 MG/2ML IJ SOLN
4.0000 mg | Freq: Once | INTRAMUSCULAR | Status: AC
  Administered 2019-10-24: 4 mg via INTRAVENOUS
  Filled 2019-10-24: qty 2

## 2019-10-24 MED ORDER — ACETAMINOPHEN 325 MG PO TABS: 650.0000 mg | ORAL_TABLET | Freq: Four times a day (QID) | ORAL | Status: DC | PRN

## 2019-10-24 MED ORDER — MORPHINE SULFATE (PF) 2 MG/ML IV SOLN
2.0000 mg | INTRAVENOUS | Status: DC | PRN
  Administered 2019-10-25 – 2019-10-26 (×4): 2 mg via INTRAVENOUS
  Filled 2019-10-24 (×5): qty 1

## 2019-10-24 MED ORDER — ONDANSETRON HCL 4 MG/2ML IJ SOLN: 4.0000 mg | Freq: Four times a day (QID) | INTRAMUSCULAR | Status: DC | PRN

## 2019-10-24 MED ORDER — ONDANSETRON HCL 4 MG PO TABS: 4.0000 mg | ORAL_TABLET | Freq: Four times a day (QID) | ORAL | Status: DC | PRN

## 2019-10-24 MED ORDER — ENOXAPARIN SODIUM 60 MG/0.6ML ~~LOC~~ SOLN
60.0000 mg | Freq: Every day | SUBCUTANEOUS | Status: DC
  Administered 2019-10-24: 60 mg via SUBCUTANEOUS
  Filled 2019-10-24: qty 0.6

## 2019-10-24 MED ORDER — INSULIN ASPART 100 UNIT/ML ~~LOC~~ SOLN
0.0000 [IU] | Freq: Three times a day (TID) | SUBCUTANEOUS | Status: DC
  Administered 2019-10-25: 5 [IU] via SUBCUTANEOUS
  Administered 2019-10-25 (×2): 8 [IU] via SUBCUTANEOUS
  Administered 2019-10-26: 11 [IU] via SUBCUTANEOUS
  Administered 2019-10-26 (×2): 5 [IU] via SUBCUTANEOUS
  Administered 2019-10-27: 8 [IU] via SUBCUTANEOUS
  Administered 2019-10-27: 5 [IU] via SUBCUTANEOUS

## 2019-10-24 MED ORDER — INSULIN ASPART 100 UNIT/ML ~~LOC~~ SOLN
0.0000 [IU] | Freq: Every day | SUBCUTANEOUS | Status: DC
  Administered 2019-10-25 – 2019-10-26 (×2): 2 [IU] via SUBCUTANEOUS

## 2019-10-24 NOTE — Progress Notes (Signed)
Lovenox per Pharmacy for DVT Prophylaxis    Pharmacy has been consulted from dosing enoxaparin (lovenox) in this patient for DVT prophylaxis.  The pharmacist has reviewed pertinent labs (Hgb _12.5__; PLT__527_), patient weight (_141__kg) and renal function (CrCl__>90_mL/min) and decided that enoxaparin _60_mg SQ Q24Hrs is appropriate for this patient.  The pharmacy department will sign off at this time.  Please reconsult pharmacy if status changes or for further issues.  Thank you  Cyndia Diver PharmD, BCPS  10/24/2019, 10:37 PM

## 2019-10-24 NOTE — ED Triage Notes (Signed)
Patient c/o burning sensation in right upper abd that radiates to back that started after falling and fracturing 9th rib on 5/14. Patient states now has intermittent pain/burning in lower abd. Patient nausea, no vomiting. Denies any diarrhea. Unsure of fevers. Patient's last normal BM today. Per patient has blood in both stool and urine. Per patient clots in both stool and urine.

## 2019-10-24 NOTE — ED Provider Notes (Signed)
[facility] Provider Note   CSN: [id number] Arrival date & time: 10/24/19  1557     History Chief Complaint  Patient presents with  . Abdominal Pain    with blood in stool and hematuria    [name] [name] is a 54 y.o. male.  Patient complains of right flank pain.  Patient also complains of epigastric discomfort and's feeling weak  The history is provided by the patient. No language interpreter was used.  Abdominal Pain Pain location:  R flank Pain quality: aching   Pain radiates to:  Does not radiate Pain severity:  Moderate Onset quality:  Sudden Timing:  Constant Progression:  Worsening Chronicity:  New Context: not alcohol use   Associated symptoms: no chest pain, no cough, no diarrhea, no fatigue and no hematuria        Past Medical History:  Diagnosis Date  . Arthritis   . Asthma    as child  . Back pain, chronic 02/18/11   BULGING DISK  . Complication of anesthesia    "woke up during spleenectomy at [facility]".  . Diabetes mellitus without complication ([place])   . GERD (gastroesophageal reflux disease)    erosive reflux esophagitis, small HH  . Hemorrhoids   . History of kidney stones   . Hyperlipemia   . Hypertension   . Hypothyroidism   . IBS (irritable bowel syndrome)   . Low serum cortisol level ([facility])    Dr Suzette Battiest  . Lower extremity edema   . Nephrolithiasis 12/19/2012  . Plantar fasciitis   . Restless leg syndrome   . S/P colonoscopy 08/2008   int/ext hemorrhoids. friable, rectal polyp  . S/P endoscopy 01/10/09   Dr Vivi Ferns, small HH  . Sleep apnea    cpap  . Spinal stenosis of lumbar region 12/18/2012  . Thyroid disease   . Tubular adenoma of colon 2000  . UTI (lower urinary tract infection)     Patient Active Problem List   Diagnosis Date Noted  . Rectal bleeding 02/07/2017  . Abdominal hernia 02/07/2017  . FHx: colon cancer 02/07/2017  . Hemorrhoids, complicated [id number]  . Nephrolithiasis 12/19/2012  . Spinal  stenosis of lumbar region 12/18/2012  . RBBB 12/17/2012  . Unspecified hypothyroidism 12/17/2012  . Adrenal insufficiency ([place]) 12/17/2012  . Hyperglycemia, drug-induced 12/17/2012  . Hypokalemia 12/16/2012  . Traumatic rupture of distal biceps tendon 02/20/2012  . Depression 08/22/2011  . Adenomatous polyps 06/29/2011  . Abnormal CT scan, small bowel 03/05/2011  . Fatty liver 03/05/2011  . Pyelonephritis 02/19/2011  . Status post splenectomy 02/19/2011  . History of ITP 02/19/2011  . OSA on CPAP 02/19/2011  . GLOBUS HYSTERICUS 01/05/2009  . Irritable bowel syndrome 01/05/2009  . DYSPHAGIA UNSPECIFIED 01/05/2009  . GERD 01/04/2009  . HIATAL HERNIA 01/04/2009  . HEMATOCHEZIA 01/04/2009  . HEMOPTYSIS 01/04/2009  . ABDOMINAL PAIN 01/04/2009    Past Surgical History:  Procedure Laterality Date  . CHOLECYSTECTOMY  2006  . COLONOSCOPY  2000   Dr. Gala Romney- tubular adenoma  . COLONOSCOPY  08/10/2008   Dr. Gala Romney-  IMPRESSION:  Prominent external anal canal hemorrhoids, hyperplastic polyp  . COLONOSCOPY N/A 10/08/2013   [id number] colonic polyps -  removed as described above/Prominent grade 3 hemorrhoids -  likely source of hematochezia. tubular adenomas. next TCS 10/2016  . COLONOSCOPY WITH PROPOFOL N/A 02/25/2017   Procedure: COLONOSCOPY WITH PROPOFOL;  Surgeon: Daneil Dolin, MD;  [facility];  Service: Endoscopy;  Laterality: N/A;  8:30am  .  CYSTOSCOPY     with utetheral dilation  . DISTAL BICEPS TENDON REPAIR  02/20/2012   Procedure: DISTAL BICEPS TENDON REPAIR;  Surgeon: Lorn Junes, MD;  Location: [facility];  Service: Orthopedics;  Laterality: Left;  left elbow distal biceps tendon repair  . ESOPHAGOGASTRODUODENOSCOPY  2010   Dr. Gala Romney: normal  . FLEXIBLE SIGMOIDOSCOPY N/A 11/10/2013   Dr.Rourk- internal/external hemorrhoids. external hemorrhoids grade 3 with some residual prolapsing tissue. normal rectum aside form hemorrhoids. s/p band placement.  Marland Kitchen  HEMORRHOID BANDING N/A 11/10/2013   Procedure: HEMORRHOID BANDING;  Surgeon: Daneil Dolin, MD;  [facility];  Service: Endoscopy;  Laterality: N/A;  . HERNIA REPAIR    . INCISIONAL HERNIA REPAIR N/A 04/10/2017   Procedure: Fatima Blank HERNIORRHAPHY WITH MESH;  Surgeon: Aviva Signs, MD;  Location: [facility];  Service: General;  Laterality: N/A;  . KNEE SURGERY     left  . POLYPECTOMY  02/25/2017   Procedure: POLYPECTOMY - ASCENDING COLON;  Surgeon: Daneil Dolin, MD;  Location: [facility];  Service: Endoscopy;;  . SPLENECTOMY  2003   itp       Family History  Problem Relation Age of Onset  . Colon cancer Father 20       deceased 6 months after diagnosis  . Diabetes Brother     Social History   Tobacco Use  . Smoking status: Former Smoker    Packs/day: 0.50    Years: 30.00    Pack years: 15.00    Types: Cigarettes    Quit date: 03/05/2007    Years since quitting: 12.6  . Smokeless tobacco: Never Used  Substance Use Topics  . Alcohol use: No    Comment: 2008  . Drug use: No    Comment: 2008    Home Medications Prior to Admission medications   Medication Sig Start Date End Date Taking? Authorizing Provider  amitriptyline (ELAVIL) 10 MG tablet Take 10 mg by mouth at bedtime.    Yes [provider]  aspirin EC 81 MG tablet Take 81 mg by mouth every evening.    Yes [provider]  Cholecalciferol (VITAMIN D3) 125 MCG (5000 UT) CAPS Take 1 capsule by mouth 2 (two) times a week.   Yes [provider]  DULoxetine (CYMBALTA) 60 MG capsule Take 120 mg by mouth at bedtime.    Yes [provider]  glimepiride (AMARYL) 2 MG tablet Take 2 mg by mouth 2 (two) times daily. 09/09/19  Yes [provider]  hydrocortisone (CORTEF) 20 MG tablet Take 0.5 tablets (10 mg total) by mouth daily. For cortisol replacement Patient taking differently: Take 40 mg by mouth daily. For cortisol replacement 08/23/11  Yes Darrol Jump, MD    ibuprofen (ADVIL) 800 MG tablet Take 1 tablet (800 mg total) by mouth 3 (three) times daily. 10/09/19  Yes Avegno, Darrelyn Hillock, FNP  levothyroxine (SYNTHROID) 137 MCG tablet Take 137 mcg by mouth every morning. 09/30/19  Yes [provider]  losartan (COZAAR) 100 MG tablet Take 100 mg by mouth daily. 04/01/17  Yes [provider]  metFORMIN (GLUCOPHAGE) 1000 MG tablet Take 1,000 mg by mouth 2 (two) times daily. 10/21/17  Yes [provider]  Multiple Vitamins-Minerals (MENS MULTIVITAMIN PLUS) TABS Take 1 tablet by mouth daily. /For nutritional supplementation. 08/23/11  Yes Darrol Jump, MD  Omega-3 Fatty Acids (OMEGA-3 FISH OIL PO) Take 1 capsule by mouth at bedtime.    Yes [provider]  omeprazole (PRILOSEC) 40 MG capsule Take 1 capsule (40 mg total) by mouth daily. Patient taking differently: Take 40 mg by mouth in the morning.  07/16/14  Yes Annitta Needs, NP  simvastatin (ZOCOR) 40 MG tablet Take 40 mg by mouth at bedtime.  11/08/17  Yes [provider]  sitaGLIPtin (JANUVIA) 100 MG tablet Take 100 mg by mouth at bedtime.    Yes [provider]  tamsulosin (FLOMAX) 0.4 MG CAPS capsule Take 0.4 mg by mouth in the morning.    Yes [provider]    Allergies    Patient has no known allergies.  Review of Systems   Review of Systems  Constitutional: Negative for appetite change and fatigue.  HENT: Negative for congestion, ear discharge and sinus pressure.   Eyes: Negative for discharge.  Respiratory: Negative for cough.   Cardiovascular: Negative for chest pain.  Gastrointestinal: Positive for abdominal pain. Negative for diarrhea.  Genitourinary: Negative for frequency and hematuria.       Flank pain  Musculoskeletal: Negative for back pain.  Skin: Negative for rash.  Neurological: Negative for seizures and headaches.  Psychiatric/Behavioral: Negative for hallucinations.    Physical Exam Updated Vital Signs BP (!)  133/58   Pulse 93   Temp 98.1 F (36.7 C) (Oral)   Resp 18   Ht 5\' 7"  (1.702 m)   Wt (!) 141.5 kg   SpO2 93%   BMI 48.87 kg/m   Physical Exam Vitals reviewed.  Constitutional:      Appearance: He is well-developed.  HENT:     Head: Normocephalic.     Nose: Nose normal.  Eyes:     General: No scleral icterus.    Conjunctiva/sclera: Conjunctivae normal.  Neck:     Thyroid: No thyromegaly.  Cardiovascular:     Rate and Rhythm: Normal rate and regular rhythm.     Heart sounds: No murmur. No friction rub. No gallop.   Pulmonary:     Breath sounds: No stridor. No wheezing or rales.  Chest:     Chest wall: No tenderness.  Abdominal:     General: There is no distension.     Tenderness: There is no abdominal tenderness. There is no rebound.  Genitourinary:    Comments: Tender right flank Musculoskeletal:        General: Normal range of motion.     Cervical back: Neck supple.  Lymphadenopathy:     Cervical: No cervical adenopathy.  Skin:    Findings: No erythema or rash.  Neurological:     Mental Status: He is alert and oriented to person, place, and time.     Motor: No abnormal muscle tone.     Coordination: Coordination normal.  Psychiatric:        Behavior: Behavior normal.     ED Results / Procedures / Treatments   Labs (all labs ordered are listed, but only abnormal results are displayed) Labs Reviewed  COMPREHENSIVE METABOLIC PANEL - Abnormal; Notable for the following components:      Result Value   Sodium 133 (*)    Glucose, Bld 289 (*)    All other components within normal limits  CBC - Abnormal; Notable for the following components:   WBC 18.4 (*)    Hemoglobin 12.5 (*)    MCV 79.9 (*)    MCH 24.9 (*)    RDW 17.9 (*)    Platelets 527 (*)    All other components within normal limits  URINALYSIS, ROUTINE W REFLEX MICROSCOPIC - Abnormal; Notable for the following components:   Specific Gravity, Urine >1.046 (*)    Glucose, UA >=500 (*)    Nitrite  POSITIVE (*)    Bacteria, UA MANY (*)    All other components within normal limits  PROTIME-INR  LIPASE, BLOOD  POC OCCULT BLOOD, ED  TYPE AND SCREEN    EKG None  Radiology CT ABDOMEN PELVIS W CONTRAST  Result Date: 10/24/2019 CLINICAL DATA:  Right upper abdominal pain radiating to back, fell 10/09/2019 with a right rib fracture EXAM: CT ABDOMEN AND PELVIS WITH CONTRAST TECHNIQUE: Multidetector CT imaging of the abdomen and pelvis was performed using the standard protocol following bolus administration of intravenous contrast. CONTRAST:  147mL OMNIPAQUE IOHEXOL 300 MG/ML  SOLN COMPARISON:  11/12/2017, 10/09/2019 FINDINGS: Lower chest: No acute pleural or parenchymal lung disease. Hepatobiliary: Stable mild diffuse hepatic steatosis. No focal liver abnormality. Gallbladder is surgically absent. Pancreas: Unremarkable. No pancreatic ductal dilatation or surrounding inflammatory changes. Spleen: Surgically absent Adrenals/Urinary Tract: Stable 4 mm nonobstructing left renal calculus. Stable left renal cortical cyst. Right kidney is unremarkable. The adrenals are normal. Bladder is grossly normal. Stomach/Bowel: No bowel obstruction or ileus. Normal appendix right lower quadrant. No wall thickening or inflammatory change. Vascular/Lymphatic: Aortic atherosclerosis. No enlarged abdominal or pelvic lymph nodes. Reproductive: Prostate is unremarkable. Other: No abdominal wall hernia or abnormality. No abdominopelvic ascites. Musculoskeletal: There are no acute displaced fractures. Specifically, no right anterior ninth rib fracture to correspond with the previous x-ray findings. Reconstructed images demonstrate no additional findings. IMPRESSION: 1. Stable 4 mm nonobstructing left renal calculus. 2. Stable mild diffuse hepatic steatosis. 3. No acute displaced fractures. Findings reported on prior rib series likely reflect superimposed shadows rather than acute fracture. 4. Aortic Atherosclerosis  (ICD10-I70.0). Electronically Signed   By: Randa Ngo M.D.   On: 10/24/2019 18:27    Procedures Procedures (including critical care time)  Medications Ordered in ED Medications  cefTRIAXone (ROCEPHIN) 1 g in sodium chloride 0.9 % 100 mL IVPB (has no administration in time range)  sodium chloride 0.9 % bolus 500 mL (0 mLs Intravenous Stopped 10/24/19 1933)  HYDROmorphone (DILAUDID) injection 1 mg (1 mg Intravenous Given 10/24/19 1704)  ondansetron (ZOFRAN) injection 4 mg (4 mg Intravenous Given 10/24/19 1704)  iohexol (OMNIPAQUE) 300 MG/ML solution 100 mL (100 mLs Intravenous Contrast Given 10/24/19 1759)    ED Course  I have reviewed the triage vital signs and the nursing notes.  Pertinent labs & imaging results that were available during my care of the patient were reviewed by me and considered in my medical decision making (see chart for details).    MDM Rules/Calculators/A&P                      Patient with urinary tract infection and most likely [place].  He will be admitted for IV antibiotics     This patient presents to the ED for concern of abdominal pain this involves an extensive number of treatment options, and is a complaint that carries with it a high risk of complications and morbidity.  The differential diagnosis includes gastritis gastroenteritis   Lab Tests:   I Ordered, reviewed, and interpreted labs, which included CBC and chemistries that show elevated white count and elevated glucose.  Patient also had a urinalysis that suggest urinary tract infection  Medicines ordered:   I ordered medication Rocephin for UTI Dilaudid for pain  Imaging Studies ordered:   I  ordered imaging studies which included CT abdomen  and  I independently visualized and interpreted imaging which showed no acute disease  Additional history obtained:   Additional history obtained from records  Previous records obtained and reviewed   Consultations Obtained:   I  consulted hospitalist and discussed lab and imaging findings  Reevaluation:  After the interventions stated above, I reevaluated the patient and found mild improvement  Critical Interventions:  .   Final Clinical Impression(s) / ED Diagnoses Final diagnoses:  Acute pyelonephritis    Rx / DC Orders ED Discharge Orders    None       Milton Ferguson, MD 10/26/19 [phone]

## 2019-10-25 ENCOUNTER — Observation Stay (HOSPITAL_COMMUNITY): Payer: Self-pay

## 2019-10-25 DIAGNOSIS — K921 Melena: Secondary | ICD-10-CM

## 2019-10-25 DIAGNOSIS — E2740 Unspecified adrenocortical insufficiency: Secondary | ICD-10-CM

## 2019-10-25 DIAGNOSIS — N12 Tubulo-interstitial nephritis, not specified as acute or chronic: Secondary | ICD-10-CM

## 2019-10-25 DIAGNOSIS — Z9081 Acquired absence of spleen: Secondary | ICD-10-CM

## 2019-10-25 DIAGNOSIS — E1165 Type 2 diabetes mellitus with hyperglycemia: Secondary | ICD-10-CM

## 2019-10-25 DIAGNOSIS — D649 Anemia, unspecified: Secondary | ICD-10-CM

## 2019-10-25 DIAGNOSIS — K648 Other hemorrhoids: Secondary | ICD-10-CM

## 2019-10-25 DIAGNOSIS — R531 Weakness: Secondary | ICD-10-CM

## 2019-10-25 LAB — GLUCOSE, CAPILLARY
Glucose-Capillary: 297 mg/dL — ABNORMAL HIGH (ref 70–99)
Glucose-Capillary: 232 mg/dL — ABNORMAL HIGH (ref 70–99)
Glucose-Capillary: 264 mg/dL — ABNORMAL HIGH (ref 70–99)
Glucose-Capillary: 228 mg/dL — ABNORMAL HIGH (ref 70–99)

## 2019-10-25 LAB — COMPREHENSIVE METABOLIC PANEL
ALT: 32 U/L (ref 0–44)
AST: 21 U/L (ref 15–41)
Albumin: 3.6 g/dL (ref 3.5–5.0)
Alkaline Phosphatase: 54 U/L (ref 38–126)
Anion gap: 13 (ref 5–15)
BUN: 14 mg/dL (ref 6–20)
CO2: 22 mmol/L (ref 22–32)
Calcium: 8.9 mg/dL (ref 8.9–10.3)
Chloride: 101 mmol/L (ref 98–111)
Creatinine, Ser: 0.81 mg/dL (ref 0.61–1.24)
GFR calc Af Amer: >60 mL/min (ref >60)
GFR calc non Af Amer: >60 mL/min (ref >60)
Glucose, Bld: 232 mg/dL — ABNORMAL HIGH (ref 70–99)
Potassium: 3.6 mmol/L (ref 3.5–5.1)
Sodium: 136 mmol/L (ref 135–145)
Total Bilirubin: 0.2 mg/dL — ABNORMAL LOW (ref 0.3–1.2)
Total Protein: 7.1 g/dL (ref 6.5–8.1)

## 2019-10-25 LAB — CBC
HCT: 36.3 % — ABNORMAL LOW (ref 39.0–52.0)
Hemoglobin: 11.5 g/dL — ABNORMAL LOW (ref 13.0–17.0)
MCH: 25.3 pg — ABNORMAL LOW (ref 26.0–34.0)
MCHC: 31.7 g/dL (ref 30.0–36.0)
MCV: 79.8 fL — ABNORMAL LOW (ref 80.0–100.0)
Platelets: 502 10*3/uL — ABNORMAL HIGH (ref 150–400)
RBC: 4.55 MIL/uL (ref 4.22–5.81)
RDW: 17.6 % — ABNORMAL HIGH (ref 11.5–15.5)
WBC: 16.7 10*3/uL — ABNORMAL HIGH (ref 4.0–10.5)
nRBC: 0 % (ref 0.0–0.2)

## 2019-10-25 LAB — LACTIC ACID, PLASMA: Lactic Acid, Venous: 2.2 mmol/L (ref 0.5–1.9)

## 2019-10-25 LAB — TROPONIN I (HIGH SENSITIVITY)
Troponin I (High Sensitivity): 6 ng/L (ref <18)
Troponin I (High Sensitivity): 7 ng/L (ref <18)

## 2019-10-25 LAB — HIV ANTIBODY (ROUTINE TESTING W REFLEX): HIV Screen 4th Generation wRfx: NONREACTIVE

## 2019-10-25 LAB — HEMOGLOBIN A1C
Hgb A1c MFr Bld: 10.3 % — ABNORMAL HIGH (ref 4.8–5.6)
Mean Plasma Glucose: 248.91 mg/dL

## 2019-10-25 LAB — PROCALCITONIN: Procalcitonin: <0.1 ng/mL

## 2019-10-25 MED ORDER — LEVOTHYROXINE SODIUM 137 MCG PO TABS
137.0000 ug | ORAL_TABLET | Freq: Every day | ORAL | Status: DC
  Administered 2019-10-26 – 2019-10-27 (×2): 137 ug via ORAL
  Filled 2019-10-25 (×3): qty 1

## 2019-10-25 MED ORDER — PANTOPRAZOLE SODIUM 40 MG PO TBEC
40.0000 mg | DELAYED_RELEASE_TABLET | Freq: Every day | ORAL | Status: DC
  Administered 2019-10-25: 40 mg via ORAL
  Filled 2019-10-25: qty 1

## 2019-10-25 MED ORDER — SIMVASTATIN 20 MG PO TABS
40.0000 mg | ORAL_TABLET | Freq: Every day | ORAL | Status: DC
  Administered 2019-10-25 – 2019-10-26 (×2): 40 mg via ORAL
  Filled 2019-10-25 (×2): qty 2

## 2019-10-25 MED ORDER — PANTOPRAZOLE SODIUM 40 MG IV SOLR
40.0000 mg | Freq: Two times a day (BID) | INTRAVENOUS | Status: DC
  Administered 2019-10-25 – 2019-10-27 (×4): 40 mg via INTRAVENOUS
  Filled 2019-10-25 (×4): qty 40

## 2019-10-25 MED ORDER — TAMSULOSIN HCL 0.4 MG PO CAPS
0.4000 mg | ORAL_CAPSULE | Freq: Every day | ORAL | Status: DC
  Administered 2019-10-25 – 2019-10-27 (×3): 0.4 mg via ORAL
  Filled 2019-10-25 (×3): qty 1

## 2019-10-25 MED ORDER — PANTOPRAZOLE SODIUM 40 MG IV SOLR: 40.0000 mg | Freq: Two times a day (BID) | INTRAVENOUS | Status: DC

## 2019-10-25 MED ORDER — HYDROCORTISONE 20 MG PO TABS
40.0000 mg | ORAL_TABLET | Freq: Every day | ORAL | Status: DC
  Administered 2019-10-25 – 2019-10-27 (×3): 40 mg via ORAL
  Filled 2019-10-25 (×4): qty 2

## 2019-10-25 MED ORDER — DULOXETINE HCL 60 MG PO CPEP
120.0000 mg | ORAL_CAPSULE | Freq: Every day | ORAL | Status: DC
  Administered 2019-10-25 – 2019-10-26 (×2): 120 mg via ORAL
  Filled 2019-10-25 (×2): qty 2

## 2019-10-25 MED ORDER — LOSARTAN POTASSIUM 25 MG PO TABS: 100.0000 mg | ORAL_TABLET | Freq: Every day | ORAL | Status: DC

## 2019-10-25 MED ORDER — AMITRIPTYLINE HCL 10 MG PO TABS
10.0000 mg | ORAL_TABLET | Freq: Every day | ORAL | Status: DC
  Administered 2019-10-25 – 2019-10-26 (×2): 10 mg via ORAL
  Filled 2019-10-25 (×3): qty 1

## 2019-10-25 MED ORDER — ASPIRIN EC 81 MG PO TBEC
81.0000 mg | DELAYED_RELEASE_TABLET | Freq: Every evening | ORAL | Status: DC
  Administered 2019-10-25 – 2019-10-26 (×2): 81 mg via ORAL
  Filled 2019-10-25 (×2): qty 1

## 2019-10-25 MED ORDER — LACTATED RINGERS IV BOLUS
1000.0000 mL | Freq: Once | INTRAVENOUS | Status: AC
  Administered 2019-10-25: 1000 mL via INTRAVENOUS

## 2019-10-25 MED ORDER — HYDROCODONE-ACETAMINOPHEN 5-325 MG PO TABS
1.0000 | ORAL_TABLET | ORAL | Status: DC | PRN
  Administered 2019-10-26 – 2019-10-27 (×6): 1 via ORAL
  Filled 2019-10-25 (×6): qty 1

## 2019-10-25 NOTE — Progress Notes (Signed)
CRITICAL VALUE ALERT  Critical Value:  Lactic acid 2.2  Date & Time Notied:  10/25/2019 1203  Provider Notified: Dr. Carles Collet  Orders Received/Actions taken: LR bolus ordered

## 2019-10-25 NOTE — Progress Notes (Signed)
PROGRESS NOTE  [name] [name] [id number] DOB: 09/08/1965 DOA: 10/24/2019 PCP: Sharilyn Sites, MD  Brief History:  54 year old male with a history of hypothyroidism, hepatic steatosis, hypertension, hyperlipidemia, depression presenting with right upper quadrant/right flank pain that has been going on for couple months.  However, he states that he really noticed it worsening for the last 2 weeks after he had a mechanical fall down 6 steps on his porch.  He went to urgent care on 10/16/2019.  Rib x-ray suggested possible ninth rib fracture at that time.  The patient was given ibuprofen and tramadol.  Nevertheless, the patient continued to have right upper quadrant and right flank pain.  He has had some intermittent dysuria and hematuria since that time.  In addition, the patient began developing hematochezia on the evening of 10/23/2019.  As result, he presented for further evaluation.  He has had some subjective fevers and chills but denied any chest pain, shortness of breath, coughing, hemoptysis, vomiting, frank diarrhea.  He has had some nausea.  He denies any new medications except his ibuprofen and tramadol.  Patient states that he was taking 4 tablets of Aleve on a weekly basis even before taking ibuprofen In the emergency department, the patient was afebrile hemodynamically stable with oxygen saturation 95% on room air.  BMP, LFTs were essentially unremarkable.  WBC was 12.4 with hemoglobin 12 point 79M platelets 527,000.  UA showed 21-50 WBC with 0-5 RBCs.  Hemoccult stool was negative.  The patient was started on IV ceftriaxone and IV fluids.  Assessment/Plan: UTI/pyelonephritis -This may be partly contributing to the patient's right flank pain -Suspect a major component of his right flank pain is musculoskeletal from his recent fall down 6 steps -10/24/2019 CT abd/pelvis--hepatic steatosis, s/p chole, s/p splenectomy, nonobstructive left renal calculus, normal bladder, no bowel wall  thickening or obstruction; no anterior right rib fracture noted; unremarkable right kidney -Continue ceftriaxone pending culture data  Hematochezia -Patient endorses hematochezia at home -FOBT was negative in the ED -Certainly, patient may have intermittent anorectal bleeding -Patient requesting GI consult -02/25/2017 colonoscopy--grade 4 hemorrhoids not amenable to banding -baseline Hgb 12-13 -repeat FOBT -am CBC  Right upper quadrant/right flank pain -Suspect this is predominantly musculoskeletal -Judicious opioids -CT abdomen as discussed above -Start PPI  Essential hypertension -Holding losartan secondary to soft BP  Hyperglycemia/diabetes mellitus type 2 -Likely medication induced as the patient is on chronic Cortef -Hemoglobin A1c -Holding metformin, Januvia, Amaryl -NovoLog sliding scale  Hyperlipidemia -Continue statin  Chronic pain syndrome/depression -continue Cymbalta and amitriptyline  Hypothyroidism -Continue Synthroid  Leukocytosis -likely stress demargination and UTI -CXR -follow cultures -check lactate  -PCT        Status is: Observation  The patient remains OBS appropriate and will d/c before 2 midnights.  Dispo: The patient is from: Home              Anticipated d/c is to: Home              Anticipated d/c date is: 1 day              Patient currently is not medically stable to d/c.    Total time spent 35 minutes.  Greater than 50% spent face to face counseling and coordinating care.     Family Communication:  no Family at bedside  Consultants:  GI  Code Status:  FULL   DVT Prophylaxis: SCDs   Procedures: As Listed in Progress  Note Above  Antibiotics: Ceftriaxone 5/22>>>     Subjective:   Objective: Vitals:   10/25/19 0530 10/25/19 0600 10/25/19 0630 10/25/19 0906  BP: 94/63 113/64 (!) 103/53 129/71  Pulse: 96 97 97 94  Resp: 16 18 18 18   Temp:    98.6 F (37 C)  TempSrc:    Oral  SpO2: 94% 94% 95% 94%    Weight:      Height:       No intake or output data in the 24 hours ending 10/25/19 0924 Weight change:  Exam:   General:  Pt is alert, follows commands appropriately, not in acute distress  HEENT: No icterus, No thrush, No neck mass, [place]/AT  Cardiovascular: RRR, S1/S2, no rubs, no gallops  Respiratory: CTA bilaterally, no wheezing, no crackles, no rhonchi  Abdomen: Soft/+BS, non tender, non distended, no guarding  Extremities: No edema, No lymphangitis, No petechiae, No rashes, no synovitis   Data Reviewed: I have personally reviewed following labs and imaging studies Basic Metabolic Panel: Recent Labs  Lab 10/24/19 1702 10/25/19 0600  NA 133* 136  K 3.9 3.6  CL 98 101  CO2 24 22  GLUCOSE 289* 232*  BUN 12 14  CREATININE 0.69 0.81  CALCIUM 8.9 8.9   Liver Function Tests: Recent Labs  Lab 10/24/19 1702 10/25/19 0600  AST 25 21  ALT 40 32  ALKPHOS 55 54  BILITOT 0.5 0.2*  PROT 7.6 7.1  ALBUMIN 3.7 3.6   Recent Labs  Lab 10/24/19 1702  LIPASE 18   No results for input(s): AMMONIA in the last 168 hours. Coagulation Profile: Recent Labs  Lab 10/24/19 1702  INR 1.0   CBC: Recent Labs  Lab 10/24/19 1702 10/25/19 0600  WBC 18.4* 16.7*  HGB 12.5* 11.5*  HCT 40.2 36.3*  MCV 79.9* 79.8*  PLT 527* 502*   Cardiac Enzymes: No results for input(s): CKTOTAL, CKMB, CKMBINDEX, TROPONINI in the last 168 hours. BNP: Invalid input(s): POCBNP CBG: Recent Labs  Lab 10/24/19 2255 10/25/19 0912  GLUCAP 188* 297*   HbA1C: No results for input(s): HGBA1C in the last 72 hours. Urine analysis:    Component Value Date/Time   COLORURINE YELLOW 10/24/2019 2100   APPEARANCEUR CLEAR 10/24/2019 2100   LABSPEC >1.046 (H) 10/24/2019 2100   PHURINE 6.0 10/24/2019 2100   GLUCOSEU >=500 (A) 10/24/2019 2100   HGBUR NEGATIVE 10/24/2019 2100   [place] NEGATIVE 10/24/2019 2100   KETONESUR NEGATIVE 10/24/2019 2100   PROTEINUR NEGATIVE 10/24/2019 2100    UROBILINOGEN 0.2 12/16/2012 1820   NITRITE POSITIVE (A) 10/24/2019 2100   LEUKOCYTESUR NEGATIVE 10/24/2019 2100   Sepsis Labs: @LABRCNTIP (procalcitonin:4,lacticidven:4) ) Recent Results (from the past 240 hour(s))  SARS Coronavirus 2 by RT PCR (hospital order, performed in [facility] hospital lab) Nasopharyngeal Nasopharyngeal Swab     Status: None   Collection Time: 10/24/19 10:22 PM   Specimen: Nasopharyngeal Swab  Result Value Ref Range Status   SARS Coronavirus 2 NEGATIVE NEGATIVE Final    Comment: (NOTE) SARS-CoV-2 target nucleic acids are NOT DETECTED. The SARS-CoV-2 RNA is generally detectable in upper and lower respiratory specimens during the acute phase of infection. The lowest concentration of SARS-CoV-2 viral copies this assay can detect is 250 copies / mL. A negative result does not preclude SARS-CoV-2 infection and should not be used as the sole basis for treatment or other patient management decisions.  A negative result may occur with improper specimen collection / handling, submission of specimen other than nasopharyngeal  swab, presence of viral mutation(s) within the areas targeted by this assay, and inadequate number of viral copies (<250 copies / mL). A negative result must be combined with clinical observations, patient history, and epidemiological information. Fact Sheet for Patients:   StrictlyIdeas.no Fact Sheet for Healthcare Providers: BankingDealers.co.za This test is not yet approved or cleared  by the [place] FDA and has been authorized for detection and/or diagnosis of SARS-CoV-2 by FDA under an Emergency Use Authorization (EUA).  This EUA will remain in effect (meaning this test can be used) for the duration of the COVID-19 declaration under Section 564(b)(1) of the Act, 21 U.S.C. section 360bbb-3(b)(1), unless the authorization is terminated or revoked sooner. Performed at [facility],  [address], [place], [place] [address]   Culture, blood (routine x 2)     Status: None (Preliminary result)   Collection Time: 10/25/19 12:44 AM   Specimen: BLOOD LEFT HAND  Result Value Ref Range Status   Specimen Description BLOOD LEFT HAND  Final   Special Requests NONE  Final   Culture   Final    NO GROWTH < 12 HOURS Performed at [facility], [address], [place], [place] [address]    Report Status PENDING  Incomplete     Scheduled Meds: . amitriptyline  10 mg Oral QHS  . aspirin EC  81 mg Oral QPM  . DULoxetine  120 mg Oral QHS  . enoxaparin (LOVENOX) injection  60 mg Subcutaneous QHS  . insulin aspart  0-15 Units Subcutaneous TID WC  . insulin aspart  0-5 Units Subcutaneous QHS  . [START ON 10/26/2019] levothyroxine  137 mcg Oral Daily  . pantoprazole  40 mg Oral Daily  . simvastatin  40 mg Oral QHS  . tamsulosin  0.4 mg Oral Daily   Continuous Infusions: . sodium chloride 100 mL/hr at 10/25/19 0914  . cefTRIAXone (ROCEPHIN)  IV      Procedures/Studies: DG Ribs Unilateral W/Chest Right  Result Date: 10/09/2019 CLINICAL DATA:  [address] off porch rib pain EXAM: RIGHT RIBS AND CHEST - 3+ VIEW COMPARISON:  08/10/2016 FINDINGS: Single-view chest demonstrates low lung volumes. Chronic elevation of right diaphragm. Right rib series demonstrates probable acute right ninth anterolateral rib fracture. IMPRESSION: 1. Negative for pneumothorax or pleural effusion 2. Probable acute right ninth rib fracture Electronically Signed   By: Donavan Foil M.D.   On: 10/09/2019 20:25   CT ABDOMEN PELVIS W CONTRAST  Result Date: 10/24/2019 CLINICAL DATA:  Right upper abdominal pain radiating to back, fell 10/09/2019 with a right rib fracture EXAM: CT ABDOMEN AND PELVIS WITH CONTRAST TECHNIQUE: Multidetector CT imaging of the abdomen and pelvis was performed using the standard protocol following bolus administration of intravenous contrast. CONTRAST:  142mL OMNIPAQUE IOHEXOL 300 MG/ML  SOLN COMPARISON:   11/12/2017, 10/09/2019 FINDINGS: Lower chest: No acute pleural or parenchymal lung disease. Hepatobiliary: Stable mild diffuse hepatic steatosis. No focal liver abnormality. Gallbladder is surgically absent. Pancreas: Unremarkable. No pancreatic ductal dilatation or surrounding inflammatory changes. Spleen: Surgically absent Adrenals/Urinary Tract: Stable 4 mm nonobstructing left renal calculus. Stable left renal cortical cyst. Right kidney is unremarkable. The adrenals are normal. Bladder is grossly normal. Stomach/Bowel: No bowel obstruction or ileus. Normal appendix right lower quadrant. No wall thickening or inflammatory change. Vascular/Lymphatic: Aortic atherosclerosis. No enlarged abdominal or pelvic lymph nodes. Reproductive: Prostate is unremarkable. Other: No abdominal wall hernia or abnormality. No abdominopelvic ascites. Musculoskeletal: There are no acute displaced fractures. Specifically, no right anterior ninth rib fracture  to correspond with the previous x-ray findings. Reconstructed images demonstrate no additional findings. IMPRESSION: 1. Stable 4 mm nonobstructing left renal calculus. 2. Stable mild diffuse hepatic steatosis. 3. No acute displaced fractures. Findings reported on prior rib series likely reflect superimposed shadows rather than acute fracture. 4. Aortic Atherosclerosis (ICD10-I70.0). Electronically Signed   By: Randa Ngo M.D.   On: 10/24/2019 18:27    Orson Eva, DO  [facility]  If 7PM-7AM, please contact night-coverage [email or web address] Password [email or web address] 10/25/2019, 9:24 AM   LOS: 0 days

## 2019-10-25 NOTE — Consult Note (Signed)
Referring Provider: Orson Eva, DO Primary Care Physician:  Sharilyn Sites, MD Primary Gastroenterologist:  Dr. Gala Romney  Reason for Consultation:   Hematochezia and anemia.  HPI:   Patient is 54 year old Caucasian male with multiple medical problems including history of colonic adenomas prolapsing internal hemorrhoids who was in usual state of health until night before last when he had gross hematuria.  He passed fresh blood and clots.  He had some dysuria and burning on voiding.  He recalls hematuria stopped after 6 hours.  He began to experience pain in right upper quadrant radiating posteriorly.  Patient came to the emergency room yesterday for evaluation.  Urine analysis was abnormal.  He was felt to have urinary tract infection i.e. pyelonephritis.  Patient states that he began to have gross hematuria again while he was in the emergency room and it has cleared this morning.  Patient was begun on ceftriaxone. Patient was noted to be anemic and his MCV was just below normal.  He had Hemoccult while in emergency room and was negative.   Patient states he bleeds at least once every week.  He passes fresh and dark blood in sometimes clots.  He denies chronic constipation or diarrhea.  He says his bowels generally move daily.  He has history of hemorrhoids.  He had banding done for internal hemorrhoids by Dr. Gala Romney in September 13, 2013.  He was noted to have prolapsed internal hemorrhoids on his last colonoscopy of September 2018.  Patient had incisional herniorrhaphy by Dr. Arnoldo Morale who felt that these hemorrhoids should be addressed by colorectal surgeon.  He has not seen one. Patient states heartburn is well controlled with therapy.  He denies nausea vomiting or dysphagia.  However he complains of intermittent abdominal pain.  He states he has 2 types of pain.  He has burning pain in right upper quadrant/epigastric region which comes and goes.  Then he also has stabbing pain in both lower quadrants which again comes  and goes.  He says it the stabbing pain can radiate up into his chest.  He does not have any fever chills or diarrhea when he has this pain.  He says Dr. Gala Romney has seen him for this pain in the office and exam is always unremarkable.  He says his wife has been doing a lot of research and wants him to be screened for porphyria.  She actually has requested that we check aminolevulinic acid and porphobilinogen. Patient has very good appetite.  He states he always weighed around 220 pounds.  When he developed hypothyroidism he gained 100 pounds.  He states he has not been successful in losing weight.  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  He does not do any schedule physical activity.  He does do some yard work.  I checked his weight records and on 09/29/2013 he weighed 323 pounds.  Patient is married.  He has a son age 44 years old who has chronic urinary tract problems.  He has had UTI and now he has urinary incontinence.  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  He drives about [phone] miles round trip 5 days a week.  He quit cigarette smoking in October 2008.  He smoked pack a day for 30 years. Used to drink alcohol and excessive amount but he has not had any since Sep 14, 2006.  His father died of colon carcinoma within 6 months of diagnosis at age  58. Mother died of cardiac arrest at age 66. He has a brother age 60 and sister age 64 and they are both in good health.   Past Medical History:  Diagnosis Date  . Arthritis   . Asthma    as child  . Back pain, chronic 02/18/11   BULGING DISK  . Complication of anesthesia    "woke up during spleenectomy at [facility]".  . Diabetes mellitus without complication ([place])   . GERD (gastroesophageal reflux disease)    erosive reflux esophagitis, small HH  . Hemorrhoids   . History of kidney stones   . Hyperlipemia   . Hypertension   . Hypothyroidism   . IBS (irritable bowel syndrome)    .  Adrenocortical insufficiency    Dr Suzette Battiest  . Lower extremity edema   . Nephrolithiasis 12/19/2012  . Plantar fasciitis   . Restless leg syndrome   .  Morbid obesity.      . S/P endoscopy 01/10/09   Dr Vivi Ferns, small HH  . Sleep apnea    cpap  . Spinal stenosis of lumbar region 12/18/2012  . Thyroid disease   .  History of colonic adenomas(colonoscopy in May 2015 and September 2018) 2000        Past Surgical History:  Procedure Laterality Date  . CHOLECYSTECTOMY  2006  . COLONOSCOPY  2000   Dr. Gala Romney- tubular adenoma  . COLONOSCOPY  08/10/2008   Dr. Gala Romney-  IMPRESSION:  Prominent external anal canal hemorrhoids, hyperplastic polyp  . COLONOSCOPY N/A 10/08/2013   [id number] colonic polyps -  removed as described above/Prominent grade 3 hemorrhoids -  likely source of hematochezia. tubular adenomas. next TCS 10/2016  . COLONOSCOPY WITH PROPOFOL N/A 02/25/2017   Procedure: COLONOSCOPY WITH PROPOFOL;  Surgeon: Daneil Dolin, MD;  [facility];  Service: Endoscopy;  Laterality: N/A;  8:30am  . CYSTOSCOPY     with utetheral dilation  . DISTAL BICEPS TENDON REPAIR  02/20/2012   Procedure: DISTAL BICEPS TENDON REPAIR;  Surgeon: Lorn Junes, MD;  Location: [place];  Service: Orthopedics;  Laterality: Left;  left elbow distal biceps tendon repair  . ESOPHAGOGASTRODUODENOSCOPY  2010   Dr. Gala Romney: normal  . FLEXIBLE SIGMOIDOSCOPY N/A 11/10/2013   Dr.Rourk- internal/external hemorrhoids. external hemorrhoids grade 3 with some residual prolapsing tissue. normal rectum aside form hemorrhoids. s/p band placement.  Marland Kitchen HEMORRHOID BANDING N/A 11/10/2013   Procedure: HEMORRHOID BANDING;  Surgeon: Daneil Dolin, MD;  [facility];  Service: Endoscopy;  Laterality: N/A;  . HERNIA REPAIR    . INCISIONAL HERNIA REPAIR N/A 04/10/2017   Procedure: Fatima Blank HERNIORRHAPHY WITH MESH;  Surgeon: Aviva Signs, MD;  Location: [facility];  Service: General;  Laterality:  N/A;  . KNEE SURGERY     left  . POLYPECTOMY  02/25/2017   Procedure: POLYPECTOMY - ASCENDING COLON;  Surgeon: Daneil Dolin, MD;  [facility]: [facility];  Service: Endoscopy;;  . SPLENECTOMY  2003   itp    Prior to Admission medications   Medication Sig Start Date End Date Taking? Authorizing Provider  amitriptyline (ELAVIL) 10 MG tablet Take 10 mg by mouth at bedtime.    Yes [provider]  aspirin EC 81 MG tablet Take 81 mg by mouth every evening.    Yes [provider]  Cholecalciferol (VITAMIN D3) 125 MCG (5000 UT) CAPS Take 1 capsule by mouth 2 (two) times a week.   Yes [provider]  DULoxetine (  CYMBALTA) 60 MG capsule Take 120 mg by mouth at bedtime.    Yes [provider]  glimepiride (AMARYL) 2 MG tablet Take 2 mg by mouth 2 (two) times daily. 09/09/19  Yes [provider]  hydrocortisone (CORTEF) 20 MG tablet Take 0.5 tablets (10 mg total) by mouth daily. For cortisol replacement Patient taking differently: Take 40 mg by mouth daily. For cortisol replacement 08/23/11  Yes Darrol Jump, MD  ibuprofen (ADVIL) 800 MG tablet Take 1 tablet (800 mg total) by mouth 3 (three) times daily. 10/09/19  Yes Avegno, Darrelyn Hillock, FNP  levothyroxine (SYNTHROID) 137 MCG tablet Take 137 mcg by mouth every morning. 09/30/19  Yes [provider]  losartan (COZAAR) 100 MG tablet Take 100 mg by mouth daily. 04/01/17  Yes [provider]  metFORMIN (GLUCOPHAGE) 1000 MG tablet Take 1,000 mg by mouth 2 (two) times daily. 10/21/17  Yes [provider]  Multiple Vitamins-Minerals (MENS MULTIVITAMIN PLUS) TABS Take 1 tablet by mouth daily. /For nutritional supplementation. 08/23/11  Yes Darrol Jump, MD  Omega-3 Fatty Acids (OMEGA-3 FISH OIL PO) Take 1 capsule by mouth at bedtime.    Yes [provider]  omeprazole (PRILOSEC) 40 MG capsule Take 1 capsule (40 mg total) by mouth daily. Patient taking differently: Take 40 mg by  mouth in the morning.  07/16/14  Yes Annitta Needs, NP  simvastatin (ZOCOR) 40 MG tablet Take 40 mg by mouth at bedtime.  11/08/17  Yes [provider]  sitaGLIPtin (JANUVIA) 100 MG tablet Take 100 mg by mouth at bedtime.    Yes [provider]  tamsulosin (FLOMAX) 0.4 MG CAPS capsule Take 0.4 mg by mouth in the morning.    Yes [provider]    Current Facility-Administered Medications  Medication Dose Route Frequency Provider Last Rate Last Admin  . 0.9 %  sodium chloride infusion   Intravenous Continuous Bunnie Pion Z, DO 100 mL/hr at 10/25/19 0914 New Bag at 10/25/19 0914  . acetaminophen (TYLENOL) tablet 650 mg  650 mg Oral Q6H PRN Bunnie Pion Z, DO       Or  . acetaminophen (TYLENOL) suppository 650 mg  650 mg Rectal Q6H PRN Bunnie Pion Z, DO      . amitriptyline (ELAVIL) tablet 10 mg  10 mg Oral QHS Bunnie Pion Z, DO      . aspirin EC tablet 81 mg  81 mg Oral QPM Bunnie Pion Z, DO      . cefTRIAXone (ROCEPHIN) 1 g in sodium chloride 0.9 % 100 mL IVPB  1 g Intravenous Q24H Bunnie Pion Z, DO      . DULoxetine (CYMBALTA) DR capsule 120 mg  120 mg Oral QHS Bunnie Pion Z, DO      . HYDROcodone-acetaminophen (NORCO/VICODIN) 5-325 MG per tablet 1 tablet  1 tablet Oral Q4H PRN Tat, David, MD      . hydrocortisone (CORTEF) tablet 40 mg  40 mg Oral Daily Tat, David, MD      . insulin aspart (novoLOG) injection 0-15 Units  0-15 Units Subcutaneous TID [facility] Bunnie Pion Z, DO   8 Units at 10/25/19 [phone]  . insulin aspart (novoLOG) injection 0-5 Units  0-5 Units Subcutaneous QHS Bunnie Pion Z, DO      . [START ON 10/26/2019] levothyroxine (SYNTHROID) tablet 137 mcg  137 mcg Oral Daily Bunnie Pion Z, DO      . morphine 2 MG/ML injection 2 mg  2 mg Intravenous Q4H  PRN Bunnie Pion Z, DO   2 mg at 10/25/19 0945  . ondansetron (ZOFRAN) tablet 4 mg  4 mg Oral Q6H PRN Bunnie Pion Z, DO       Or  . ondansetron [facility]) injection 4 mg  4 mg Intravenous  Q6H PRN Bunnie Pion Z, DO      . pantoprazole (PROTONIX) injection 40 mg  40 mg Intravenous Q12H TatShanon Brow, MD      . simvastatin (ZOCOR) tablet 40 mg  40 mg Oral QHS Bunnie Pion Z, DO      . tamsulosin [facility]) capsule 0.4 mg  0.4 mg Oral Daily Bunnie Pion Z, DO   0.4 mg at 10/25/19 [id number]    Allergies as of 10/24/2019  . (No Known Allergies)    Family History  Problem Relation Age of Onset  . Colon cancer Father 71       deceased 6 months after diagnosis  . Diabetes Brother     Social History   Socioeconomic History  . Marital status: Married    Spouse name: Not on file  . Number of children: 1  . Years of education: Not on file  . Highest education level: Not on file  Occupational History  . Occupation: truck Aeronautical engineer: [employer]  Tobacco Use  . Smoking status: Former Smoker    Packs/day: 0.50    Years: 30.00    Pack years: 15.00    Types: Cigarettes    Quit date: 03/05/2007    Years since quitting: 12.6  . Smokeless tobacco: Never Used  Substance and Sexual Activity  . Alcohol use: No    Comment: 2008  . Drug use: No    Comment: 2008  . Sexual activity: Yes  Other Topics Concern  . Not on file  Social History Narrative   1 healthy son   Social Determinants of Health   Financial Resource Strain:   . Difficulty of Paying Living Expenses:   Food Insecurity:   . Worried About Charity fundraiser in the Last Year:   . Arboriculturist in the Last Year:   Transportation Needs:   . Film/video editor (Medical):   Marland Kitchen Lack of Transportation (Non-Medical):   Physical Activity:   . Days of Exercise per Week:   . Minutes of Exercise per Session:   Stress:   . Feeling of Stress :   Social Connections:   . Frequency of Communication with Friends and Family:   . Frequency of Social Gatherings with Friends and Family:   . Attends Religious Services:   . Active Member of Clubs or Organizations:   . Attends Archivist  Meetings:   Marland Kitchen Marital Status:   Intimate Partner Violence:   . Fear of Current or Ex-Partner:   . Emotionally Abused:   Marland Kitchen Physically Abused:   . Sexually Abused:     Review of Systems: See HPI, otherwise normal ROS  Physical Exam: Temp:  [98.1 F (36.7 C)-98.6 F (37 C)] 98.6 F (37 C) (05/23 0906) Pulse Rate:  [87-101] 94 (05/23 0906) Resp:  [16-19] 18 (05/23 0906) BP: (94-153)/(51-87) 129/71 (05/23 0906) SpO2:  [90 %-98 %] 94 % (05/23 0906) Weight:  [141.5 kg] 141.5 kg (05/22 1624)   Well-developed obese Caucasian male in no acute distress. Conjunctivae is pink.  Sclerae nonicteric. Oropharyngeal mucosa is normal.  Few of the teeth are missing but rest in satisfactory condition. Neck without masses thyromegaly or  lymphadenopathy. Cardiac exam with regular rhythm normal S1 and S2.  No murmur or gallop noted. Auscultation lungs reveal vesicular breath sounds bilaterally. Abdomen is protuberant.  Bowel sounds are normal.  He has scar above umbilicus from prior herniorrhaphy.  On palpation abdomen is soft.  He has mild tenderness in epigastrium and right upper quadrant.  Difficult to palpate liver or spleen. He has nonpitting edema around ankles.  There is no pitting edema.  There is no clubbing or koilonychia.  Lab Results: Recent Labs    10/24/19 1702 10/25/19 0600  WBC 18.4* 16.7*  HGB 12.5* 11.5*  HCT 40.2 36.3*  PLT 527* 502*   BMET Recent Labs    10/24/19 1702 10/25/19 0600  NA 133* 136  K 3.9 3.6  CL 98 101  CO2 24 22  GLUCOSE 289* 232*  BUN 12 14  CREATININE 0.69 0.81  CALCIUM 8.9 8.9   LFT Recent Labs    10/25/19 0600  PROT 7.1  ALBUMIN 3.6  AST 21  ALT 32  ALKPHOS 54  BILITOT 0.2*   PT/INR Recent Labs    10/24/19 1702  LABPROT 12.6  INR 1.0    Studies/Results: DG Chest 2 View  Result Date: 10/25/2019 CLINICAL DATA:  Chest pain EXAM: CHEST - 2 VIEW COMPARISON:  10/09/2019 FINDINGS: The heart size and mediastinal contours are within  normal limits. Both lungs are clear. The visualized skeletal structures are unremarkable. IMPRESSION: No active cardiopulmonary disease. Electronically Signed   By: Inez Catalina M.D.   On: 10/25/2019 11:30   CT ABDOMEN PELVIS W CONTRAST  Result Date: 10/24/2019 CLINICAL DATA:  Right upper abdominal pain radiating to back, fell 10/09/2019 with a right rib fracture EXAM: CT ABDOMEN AND PELVIS WITH CONTRAST TECHNIQUE: Multidetector CT imaging of the abdomen and pelvis was performed using the standard protocol following bolus administration of intravenous contrast. CONTRAST:  169mL OMNIPAQUE IOHEXOL 300 MG/ML  SOLN COMPARISON:  11/12/2017, 10/09/2019 FINDINGS: Lower chest: No acute pleural or parenchymal lung disease. Hepatobiliary: Stable mild diffuse hepatic steatosis. No focal liver abnormality. Gallbladder is surgically absent. Pancreas: Unremarkable. No pancreatic ductal dilatation or surrounding inflammatory changes. Spleen: Surgically absent Adrenals/Urinary Tract: Stable 4 mm nonobstructing left renal calculus. Stable left renal cortical cyst. Right kidney is unremarkable. The adrenals are normal. Bladder is grossly normal. Stomach/Bowel: No bowel obstruction or ileus. Normal appendix right lower quadrant. No wall thickening or inflammatory change. Vascular/Lymphatic: Aortic atherosclerosis. No enlarged abdominal or pelvic lymph nodes. Reproductive: Prostate is unremarkable. Other: No abdominal wall hernia or abnormality. No abdominopelvic ascites. Musculoskeletal: There are no acute displaced fractures. Specifically, no right anterior ninth rib fracture to correspond with the previous x-ray findings. Reconstructed images demonstrate no additional findings. IMPRESSION: 1. Stable 4 mm nonobstructing left renal calculus. 2. Stable mild diffuse hepatic steatosis. 3. No acute displaced fractures. Findings reported on prior rib series likely reflect superimposed shadows rather than acute fracture. 4. Aortic  Atherosclerosis (ICD10-I70.0). Electronically Signed   By: Randa Ngo M.D.   On: 10/24/2019 18:27   I have reviewed abdominopelvic CT.  Significant GI finding is fatty liver. Evidence of cholecystectomy.  Pancreas is unremarkable.  Assessment;  Patient is 54 year old Caucasian male who presented to emergency room yesterday with gross hematuria and suspected to have UTI/pyelonephritis.  Patient is on ceftriaxone. Patient also has been experiencing hematochezia which is a chronic symptom and it is felt secondary to known prolapsing internal hemorrhoids.  His last colonoscopy was in in September 2018. His GI issues can  be summarized as below.  #1.  Chronic hematochezia secondary to well-documented prolapsing internal hemorrhoids.  He underwent banding back in June 2015.  I am afraid now he will required excision and ligation for which she will need to see colorectal surgeon when UTI treated.  #2.  Anemia.  Hemoglobin has dropped by 1 g since admission and this is most likely due to hydration.  He is not having gross bleeding to account for this drop.  Stool guaiac emergency room yesterday was negative.  Patient is on low-dose aspirin and uses ibuprofen on as-needed basis and he is also having abdominal pain.  Therefore need to rule out peptic ulcer disease.  Finally he is on chronic PPI therapy and may simply have impaired iron absorption due to chronic acid suppression.  We will also check serum B12 level given that he already has 2 autoimmune diseases i.e. adrenal cortical insufficiency and hypothyroidism  #3.  Abdominal pain.  He has been experiencing burning epigastric pain as well as stabbing bilateral lower abdominal pain off and on for several months if not years.  Upper abdominal pain may be due to gastritis and/or peptic ulcer disease.  EGD would be considered either during this admission or on an outpatient basis.  #4.  Patient is high risk for colorectal carcinoma.  His father died at  age 14 of colon carcinoma.  His first colonoscopy was in 2010 with removal of hyperplastic polyp.  He had multiple polyps removed on his colonoscopy of May 2015 and 3 of these polyps are tubular adenomas.  He had 6 mm adenoma removed on his last colonoscopy of September 2018.Marland Kitchen  He is not due for surveillance colonoscopy until September 2023.  #5.  Fatty liver.  He has had elevated transaminases in the past but transaminases this admission are normal.  Recommendations;  Will check CBC serum iron TIBC and ferritin with a.m. labs. We will also check serum B12 level. Diagnostic esophagogastroduodenoscopy either during this admission or on an outpatient basis. Outpatient evaluation by colorectal surgeon for prolapsing internal hemorrhoids. I would defer screening for porphyria to Dr. Gala Romney.   LOS: 0 days   Thao Vanover  10/25/2019, 11:35 AM

## 2019-10-25 NOTE — H&P (Signed)
History and Physical    [name] [name] [id number] DOB: May 11, 1966 DOA: 10/24/2019  PCP: Sharilyn Sites, MD (Confirm with patient/family/NH records and if not entered, this has to be entered at [facility] point of entry) Patient coming from: Home  I have personally briefly reviewed patient's old medical records in [place]  Chief Complaint: Right flank pain  HPI: [name] [name] is a 54 y.o. male with medical history significant of hypertension, hyperlipidemia, hypothyroidism, irritable bowel syndrome diabetes mellitus presented to ED for evaluation of epigastric pain, right flank pain and severe generalized weakness.  Patient states that pain started in the right flank pain with some hematuria and dysuria 2 to 3 days ago and continue to worsen.  Patient states he is also feeling very weak and have a poor appetite.  Patient admits of having some nausea but no vomiting.  Patient otherwise denies fever, chills, chest pain, shortness of breath and peripheral edema.  ED Course: On arrival to the ED, patient had temperature of 98.1, blood pressure 153/87, heart rate 101, respiratory rate 18, oxygen saturation 96% on room air.  Blood work showed leukocytosis with WBC count of 18.4, hemoglobin 12.5, sodium 133, potassium 3.9, BUN 12, creatinine 0.69, blood glucose 289.  UA positive for UTI.  CT abdomen showed stable 4 mm nonobstructing left renal stone and stable mild diffuse hepatic steatosis.  Patient was managed with a bolus of IV normal saline, 1 dose of hydromorphone and IV ceftriaxone in the ED.  Review of Systems: As per HPI otherwise 10 point review of systems negative.  Unacceptable ROS statements: "10 systems reviewed," "Extensive" (without elaboration).  Acceptable ROS statements: "All others negative," "All others reviewed and are negative," and "All others unremarkable," with at [place] documented Can't double dip - if using for HPI can't use for ROS  Past Medical History:  Diagnosis  Date  . Arthritis   . Asthma    as child  . Back pain, chronic 02/18/11   BULGING DISK  . Complication of anesthesia    "woke up during spleenectomy at [facility]".  . Diabetes mellitus without complication ([place])   . GERD (gastroesophageal reflux disease)    erosive reflux esophagitis, small HH  . Hemorrhoids   . History of kidney stones   . Hyperlipemia   . Hypertension   . Hypothyroidism   . IBS (irritable bowel syndrome)   . Low serum cortisol level (HCC)    Dr Suzette Battiest  . Lower extremity edema   . Nephrolithiasis 12/19/2012  . Plantar fasciitis   . Restless leg syndrome   . S/P colonoscopy 08/2008   int/ext hemorrhoids. friable, rectal polyp  . S/P endoscopy 01/10/09   Dr Vivi Ferns, small HH  . Sleep apnea    cpap  . Spinal stenosis of lumbar region 12/18/2012  . Thyroid disease   . Tubular adenoma of colon 2000  . UTI (lower urinary tract infection)     Past Surgical History:  Procedure Laterality Date  . CHOLECYSTECTOMY  2006  . COLONOSCOPY  2000   Dr. Gala Romney- tubular adenoma  . COLONOSCOPY  08/10/2008   Dr. Gala Romney-  IMPRESSION:  Prominent external anal canal hemorrhoids, hyperplastic polyp  . COLONOSCOPY N/A 10/08/2013   [id number] colonic polyps -  removed as described above/Prominent grade 3 hemorrhoids -  likely source of hematochezia. tubular adenomas. next TCS 10/2016  . COLONOSCOPY WITH PROPOFOL N/A 02/25/2017   Procedure: COLONOSCOPY WITH PROPOFOL;  Surgeon: Daneil Dolin, MD;  Location: [facility]  END[facility];  Service: Endoscopy;  Laterality: N/A;  8:30am  . CYSTOSCOPY     with utetheral dilation  . DISTAL BICEPS TENDON REPAIR  02/20/2012   Procedure: DISTAL BICEPS TENDON REPAIR;  Surgeon: Lorn Junes, MD;  Location: [place];  Service: Orthopedics;  Laterality: Left;  left elbow distal biceps tendon repair  . ESOPHAGOGASTRODUODENOSCOPY  2010   Dr. Gala Romney: normal  . FLEXIBLE SIGMOIDOSCOPY N/A 11/10/2013   Dr.Rourk- internal/external hemorrhoids.  external hemorrhoids grade 3 with some residual prolapsing tissue. normal rectum aside form hemorrhoids. s/p band placement.  Marland Kitchen HEMORRHOID BANDING N/A 11/10/2013   Procedure: HEMORRHOID BANDING;  Surgeon: Daneil Dolin, MD;  Location[facility];  Service: Endoscopy;  Laterality: N/A;  . HERNIA REPAIR    . INCISIONAL HERNIA REPAIR N/A 04/10/2017   Procedure: Fatima Blank HERNIORRHAPHY WITH MESH;  Surgeon: Aviva Signs, MD;  Location: [facility] ORS;  Service: General;  Laterality: N/A;  . KNEE SURGERY     left  . POLYPECTOMY  02/25/2017   Procedure: POLYPECTOMY - ASCENDING COLON;  Surgeon: Daneil Dolin, MD;  Location: [facility];  Service: Endoscopy;;  . SPLENECTOMY  2003   itp     reports that he quit smoking about 12 years ago. His smoking use included cigarettes. He has a 15.00 pack-year smoking history. He has never used smokeless tobacco. He reports that he does not drink alcohol or use drugs.  No Known Allergies  Family History  Problem Relation Age of Onset  . Colon cancer Father 79       deceased 6 months after diagnosis  . Diabetes Brother     Unacceptable: Noncontributory, unremarkable, or negative. Acceptable: (example)Family history negative for heart disease  Prior to Admission medications   Medication Sig Start Date End Date Taking? Authorizing Provider  amitriptyline (ELAVIL) 10 MG tablet Take 10 mg by mouth at bedtime.    Yes [provider]  aspirin EC 81 MG tablet Take 81 mg by mouth every evening.    Yes [provider]  Cholecalciferol (VITAMIN D3) 125 MCG (5000 UT) CAPS Take 1 capsule by mouth 2 (two) times a week.   Yes [provider]  DULoxetine (CYMBALTA) 60 MG capsule Take 120 mg by mouth at bedtime.    Yes [provider]  glimepiride (AMARYL) 2 MG tablet Take 2 mg by mouth 2 (two) times daily. 09/09/19  Yes [provider]  hydrocortisone (CORTEF) 20 MG tablet Take 0.5 tablets (10 mg total) by mouth daily. For  cortisol replacement Patient taking differently: Take 40 mg by mouth daily. For cortisol replacement 08/23/11  Yes Darrol Jump, MD  ibuprofen (ADVIL) 800 MG tablet Take 1 tablet (800 mg total) by mouth 3 (three) times daily. 10/09/19  Yes Avegno, Darrelyn Hillock, FNP  levothyroxine (SYNTHROID) 137 MCG tablet Take 137 mcg by mouth every morning. 09/30/19  Yes [provider]  losartan (COZAAR) 100 MG tablet Take 100 mg by mouth daily. 04/01/17  Yes [provider]  metFORMIN (GLUCOPHAGE) 1000 MG tablet Take 1,000 mg by mouth 2 (two) times daily. 10/21/17  Yes [provider]  Multiple Vitamins-Minerals (MENS MULTIVITAMIN PLUS) TABS Take 1 tablet by mouth daily. /For nutritional supplementation. 08/23/11  Yes Darrol Jump, MD  Omega-3 Fatty Acids (OMEGA-3 FISH OIL PO) Take 1 capsule by mouth at bedtime.    Yes [provider]  omeprazole (PRILOSEC) 40 MG capsule Take 1 capsule (40 mg total) by mouth daily.  Patient taking differently: Take 40 mg by mouth in the morning.  07/16/14  Yes Annitta Needs, NP  simvastatin (ZOCOR) 40 MG tablet Take 40 mg by mouth at bedtime.  11/08/17  Yes [provider]  sitaGLIPtin (JANUVIA) 100 MG tablet Take 100 mg by mouth at bedtime.    Yes [provider]  tamsulosin (FLOMAX) 0.4 MG CAPS capsule Take 0.4 mg by mouth in the morning.    Yes [provider]    Physical Exam: Vitals:   10/24/19 2330 10/25/19 0030 10/25/19 0100 10/25/19 0350  BP: 135/71 (!) 121/59 138/79 (!) 120/52  Pulse: 90 95 (!) 101 99  Resp: 18 16 18    Temp:      TempSrc:      SpO2: 96% 94% 94% 90%  Weight:      Height:        Constitutional: NAD, calm, comfortable Vitals:   10/24/19 2330 10/25/19 0030 10/25/19 0100 10/25/19 0350  BP: 135/71 (!) 121/59 138/79 (!) 120/52  Pulse: 90 95 (!) 101 99  Resp: 18 16 18    Temp:      TempSrc:      SpO2: 96% 94% 94% 90%  Weight:      Height:        General: 54 year old obese Caucasian  male, laying comfortably in the bed and in no acute distress. Eyes: PERRL, lids and conjunctivae normal ENMT: Mucous membranes are moist. Posterior pharynx clear of any exudate or lesions.Normal dentition.  Neck: normal, supple, no masses, no thyromegaly Respiratory: clear to auscultation bilaterally, no wheezing, no crackles. Normal respiratory effort. No accessory muscle use.  Cardiovascular: Regular rate and rhythm, no murmurs / rubs / gallops. No extremity edema. 2+ pedal pulses. No carotid bruits.  Abdomen: Abdomen is soft, nondistended mildly tender in epigastric region., no masses palpated. No hepatosplenomegaly. Bowel sounds positive.  Right flank pain. Musculoskeletal: no clubbing / cyanosis. No joint deformity upper and lower extremities. Good ROM, no contractures. Normal muscle tone.  Skin: no rashes, lesions, ulcers. No induration Neurologic: CN 2-12 grossly intact. Sensation intact, DTR normal. Strength 5/5 in all 4.  Psychiatric: Normal judgment and insight. Alert and oriented x 3. Normal mood.   (Anything < 9 systems with 2 bullets each down codes to level 1) (If patient refuses exam can't bill higher level) (Make sure to document decubitus ulcers present on admission -- if possible -- and whether patient has chronic indwelling catheter at time of admission)  Labs on Admission: I have personally reviewed following labs and imaging studies  CBC: Recent Labs  Lab 10/24/19 1702  WBC 18.4*  HGB 12.5*  HCT 40.2  MCV 79.9*  PLT 123456*   Basic Metabolic Panel: Recent Labs  Lab 10/24/19 1702  NA 133*  K 3.9  CL 98  CO2 24  GLUCOSE 289*  BUN 12  CREATININE 0.69  CALCIUM 8.9   GFR: Estimated Creatinine Clearance: 147.1 mL/min (by C-G formula based on SCr of 0.69 mg/dL). Liver Function Tests: Recent Labs  Lab 10/24/19 1702  AST 25  ALT 40  ALKPHOS 55  BILITOT 0.5  PROT 7.6  ALBUMIN 3.7   Recent Labs  Lab 10/24/19 1702  LIPASE 18   No results for input(s):  AMMONIA in the last 168 hours. Coagulation Profile: Recent Labs  Lab 10/24/19 1702  INR 1.0   Cardiac Enzymes: No results for input(s): CKTOTAL, CKMB, CKMBINDEX, TROPONINI in the last 168 hours. BNP (last 3 results) No results for input(s):  PROBNP in the last 8760 hours. HbA1C: No results for input(s): HGBA1C in the last 72 hours. CBG: Recent Labs  Lab 10/24/19 2255  GLUCAP 188*   Lipid Profile: No results for input(s): CHOL, HDL, LDLCALC, TRIG, CHOLHDL, LDLDIRECT in the last 72 hours. Thyroid Function Tests: No results for input(s): TSH, T4TOTAL, FREET4, T3FREE, THYROIDAB in the last 72 hours. Anemia Panel: No results for input(s): VITAMINB12, FOLATE, FERRITIN, TIBC, IRON, RETICCTPCT in the last 72 hours. Urine analysis:    Component Value Date/Time   COLORURINE YELLOW 10/24/2019 2100   APPEARANCEUR CLEAR 10/24/2019 2100   LABSPEC >1.046 (H) 10/24/2019 2100   PHURINE 6.0 10/24/2019 2100   GLUCOSEU >=500 (A) 10/24/2019 2100   HGBUR NEGATIVE 10/24/2019 2100   [place] NEGATIVE 10/24/2019 2100   KETONESUR NEGATIVE 10/24/2019 2100   PROTEINUR NEGATIVE 10/24/2019 2100   UROBILINOGEN 0.2 12/16/2012 1820   NITRITE POSITIVE (A) 10/24/2019 2100   LEUKOCYTESUR NEGATIVE 10/24/2019 2100    Radiological Exams on Admission: CT ABDOMEN PELVIS W CONTRAST  Result Date: 10/24/2019 CLINICAL DATA:  Right upper abdominal pain radiating to back, fell 10/09/2019 with a right rib fracture EXAM: CT ABDOMEN AND PELVIS WITH CONTRAST TECHNIQUE: Multidetector CT imaging of the abdomen and pelvis was performed using the standard protocol following bolus administration of intravenous contrast. CONTRAST:  141mL OMNIPAQUE IOHEXOL 300 MG/ML  SOLN COMPARISON:  11/12/2017, 10/09/2019 FINDINGS: Lower chest: No acute pleural or parenchymal lung disease. Hepatobiliary: Stable mild diffuse hepatic steatosis. No focal liver abnormality. Gallbladder is surgically absent. Pancreas: Unremarkable. No pancreatic  ductal dilatation or surrounding inflammatory changes. Spleen: Surgically absent Adrenals/Urinary Tract: Stable 4 mm nonobstructing left renal calculus. Stable left renal cortical cyst. Right kidney is unremarkable. The adrenals are normal. Bladder is grossly normal. Stomach/Bowel: No bowel obstruction or ileus. Normal appendix right lower quadrant. No wall thickening or inflammatory change. Vascular/Lymphatic: Aortic atherosclerosis. No enlarged abdominal or pelvic lymph nodes. Reproductive: Prostate is unremarkable. Other: No abdominal wall hernia or abnormality. No abdominopelvic ascites. Musculoskeletal: There are no acute displaced fractures. Specifically, no right anterior ninth rib fracture to correspond with the previous x-ray findings. Reconstructed images demonstrate no additional findings. IMPRESSION: 1. Stable 4 mm nonobstructing left renal calculus. 2. Stable mild diffuse hepatic steatosis. 3. No acute displaced fractures. Findings reported on prior rib series likely reflect superimposed shadows rather than acute fracture. 4. Aortic Atherosclerosis (ICD10-I70.0). Electronically Signed   By: Randa Ngo M.D.   On: 10/24/2019 18:27     Assessment/Plan Principal Problem:    Pyelonephritis Patient is having acute pyelonephritis because of right flank pain and UA positive for UTI. Continue IV ceftriaxone 1 g daily Morphine 2 mg IV every 4 hours as needed for severe pain Tylenol as needed for mild pain and fever IV normal saline at the rate of 100 mL/h Zofran as needed for nausea and vomiting Blood and urine cultures ordered  Active Problems:   GERD Protonix 40 mg daily    Hyperglycemia due to diabetes mellitus (HCC) Moderate dose sliding scale insulin ordered Blood glucose monitoring and hypoglycemic protocol in place    Generalized weakness  Patient is having generalized weakness most likely secondary to pyelonephritis and poor oral intake secondary to nausea. IV fluids Zofran  for nausea and vomiting as needed.  Hypertension Stable Continue home meds  Hyperlipidemia Continue home medications  Hypothyroidism Continue home medications      DVT prophylaxis: Lovenox Code Status: Full code Family Communication: No family member present at the bedside Disposition Plan:  Consults called: None Admission status: Observation/MedSurg   Edmonia Lynch MD [facility] Pager [phone]   If 7PM-7AM, please contact night-coverage [email or web address] Password   10/25/2019, 4:26 AM

## 2019-10-26 ENCOUNTER — Telehealth: Payer: Self-pay | Admitting: Gastroenterology

## 2019-10-26 DIAGNOSIS — K921 Melena: Secondary | ICD-10-CM

## 2019-10-26 DIAGNOSIS — E1165 Type 2 diabetes mellitus with hyperglycemia: Secondary | ICD-10-CM

## 2019-10-26 DIAGNOSIS — N12 Tubulo-interstitial nephritis, not specified as acute or chronic: Secondary | ICD-10-CM

## 2019-10-26 LAB — VITAMIN B12: Vitamin B-12: 441 pg/mL (ref 180–914)

## 2019-10-26 LAB — BASIC METABOLIC PANEL
Anion gap: 12 (ref 5–15)
BUN: 8 mg/dL (ref 6–20)
CO2: 25 mmol/L (ref 22–32)
Calcium: 8.7 mg/dL — ABNORMAL LOW (ref 8.9–10.3)
Chloride: 98 mmol/L (ref 98–111)
Creatinine, Ser: 0.63 mg/dL (ref 0.61–1.24)
GFR calc Af Amer: >60 mL/min (ref >60)
GFR calc non Af Amer: >60 mL/min (ref >60)
Glucose, Bld: 238 mg/dL — ABNORMAL HIGH (ref 70–99)
Potassium: 3.4 mmol/L — ABNORMAL LOW (ref 3.5–5.1)
Sodium: 135 mmol/L (ref 135–145)

## 2019-10-26 LAB — CBC
HCT: 40.5 % (ref 39.0–52.0)
Hemoglobin: 12.5 g/dL — ABNORMAL LOW (ref 13.0–17.0)
MCH: 24.6 pg — ABNORMAL LOW (ref 26.0–34.0)
MCHC: 30.9 g/dL (ref 30.0–36.0)
MCV: 79.6 fL — ABNORMAL LOW (ref 80.0–100.0)
Platelets: 562 10*3/uL — ABNORMAL HIGH (ref 150–400)
RBC: 5.09 MIL/uL (ref 4.22–5.81)
RDW: 17.9 % — ABNORMAL HIGH (ref 11.5–15.5)
WBC: 12.6 10*3/uL — ABNORMAL HIGH (ref 4.0–10.5)
nRBC: 0.2 % (ref 0.0–0.2)

## 2019-10-26 LAB — GLUCOSE, CAPILLARY
Glucose-Capillary: 210 mg/dL — ABNORMAL HIGH (ref 70–99)
Glucose-Capillary: 324 mg/dL — ABNORMAL HIGH (ref 70–99)
Glucose-Capillary: 244 mg/dL — ABNORMAL HIGH (ref 70–99)
Glucose-Capillary: 228 mg/dL — ABNORMAL HIGH (ref 70–99)

## 2019-10-26 LAB — IRON AND TIBC
Iron: 19 ug/dL — ABNORMAL LOW (ref 45–182)
Saturation Ratios: 4 % — ABNORMAL LOW (ref 17.9–39.5)
TIBC: 442 ug/dL (ref 250–450)
UIBC: 423 ug/dL

## 2019-10-26 LAB — MAGNESIUM: Magnesium: 1.9 mg/dL (ref 1.7–2.4)

## 2019-10-26 LAB — FERRITIN: Ferritin: 72 ng/mL (ref 24–336)

## 2019-10-26 MED ORDER — HYDROCORTISONE (PERIANAL) 2.5 % EX CREA
TOPICAL_CREAM | Freq: Two times a day (BID) | CUTANEOUS | Status: DC | PRN
  Filled 2019-10-26: qty 28.35

## 2019-10-26 MED ORDER — INSULIN GLARGINE 100 UNIT/ML ~~LOC~~ SOLN
14.0000 [IU] | Freq: Every day | SUBCUTANEOUS | Status: DC
  Administered 2019-10-26 – 2019-10-27 (×2): 14 [IU] via SUBCUTANEOUS
  Filled 2019-10-26 (×3): qty 0.14

## 2019-10-26 MED ORDER — POTASSIUM CHLORIDE CRYS ER 20 MEQ PO TBCR
20.0000 meq | EXTENDED_RELEASE_TABLET | Freq: Once | ORAL | Status: AC
  Administered 2019-10-26: 20 meq via ORAL
  Filled 2019-10-26: qty 1

## 2019-10-26 MED ORDER — LOSARTAN POTASSIUM 50 MG PO TABS
50.0000 mg | ORAL_TABLET | Freq: Every day | ORAL | Status: DC
  Administered 2019-10-26 – 2019-10-27 (×2): 50 mg via ORAL
  Filled 2019-10-26 (×2): qty 1

## 2019-10-26 NOTE — Progress Notes (Signed)
Subjective: No rectal bleeding. Abdominal burning improved. No N/V. Tolerating diet. Chronic RUQ pain, intermittent. No postprandial component.   Objective: Vital signs in last 24 hours: Temp:  [98.1 F (36.7 C)-98.7 F (37.1 C)] 98.1 F (36.7 C) (05/24 0509) Pulse Rate:  [80-101] 80 (05/24 0509) Resp:  [17-20] 18 (05/24 0509) BP: (121-158)/(71-101) 158/81 (05/24 0509) SpO2:  [94 %-98 %] 95 % (05/24 0819) Last BM Date: 10/24/19(patient's states blood in stool ) General:   Alert and oriented, pleasant Head:  Normocephalic and atraumatic. Eyes:  No icterus, sclera clear. Conjuctiva pink.  Abdomen:  Bowel sounds present, soft, mild TTP RUQ, obese.. No rebound or guarding. No masses appreciated  Neurologic:  Alert and  oriented x4 Psych:  Alert and cooperative. Normal mood and affect.  Intake/Output from previous day: 05/23 0701 - 05/24 0700 In: 4137.3 [P.O.:840; I.V.:2207.7; IV Piggyback:1089.6] Out: 3400 [Urine:3400] Intake/Output this shift: No intake/output data recorded.  Lab Results: Recent Labs    10/24/19 1702 10/25/19 0600 10/26/19 0636  WBC 18.4* 16.7* 12.6*  HGB 12.5* 11.5* 12.5*  HCT 40.2 36.3* 40.5  PLT 527* 502* 562*   BMET Recent Labs    10/24/19 1702 10/25/19 0600 10/26/19 0636  NA 133* 136 135  K 3.9 3.6 3.4*  CL 98 101 98  CO2 24 22 25   GLUCOSE 289* 232* 238*  BUN 12 14 8   CREATININE 0.69 0.81 0.63  CALCIUM 8.9 8.9 8.7*   LFT Recent Labs    10/24/19 1702 10/25/19 0600  PROT 7.6 7.1  ALBUMIN 3.7 3.6  AST 25 21  ALT 40 32  ALKPHOS 55 54  BILITOT 0.5 0.2*   PT/INR Recent Labs    10/24/19 1702  LABPROT 12.6  INR 1.0     Studies/Results: DG Chest 2 View  Result Date: 10/25/2019 CLINICAL DATA:  Chest pain EXAM: CHEST - 2 VIEW COMPARISON:  10/09/2019 FINDINGS: The heart size and mediastinal contours are within normal limits. Both lungs are clear. The visualized skeletal structures are unremarkable. IMPRESSION: No active  cardiopulmonary disease. Electronically Signed   By: Inez Catalina M.D.   On: 10/25/2019 11:30   CT ABDOMEN PELVIS W CONTRAST  Result Date: 10/24/2019 CLINICAL DATA:  Right upper abdominal pain radiating to back, fell 10/09/2019 with a right rib fracture EXAM: CT ABDOMEN AND PELVIS WITH CONTRAST TECHNIQUE: Multidetector CT imaging of the abdomen and pelvis was performed using the standard protocol following bolus administration of intravenous contrast. CONTRAST:  1103mL OMNIPAQUE IOHEXOL 300 MG/ML  SOLN COMPARISON:  11/12/2017, 10/09/2019 FINDINGS: Lower chest: No acute pleural or parenchymal lung disease. Hepatobiliary: Stable mild diffuse hepatic steatosis. No focal liver abnormality. Gallbladder is surgically absent. Pancreas: Unremarkable. No pancreatic ductal dilatation or surrounding inflammatory changes. Spleen: Surgically absent Adrenals/Urinary Tract: Stable 4 mm nonobstructing left renal calculus. Stable left renal cortical cyst. Right kidney is unremarkable. The adrenals are normal. Bladder is grossly normal. Stomach/Bowel: No bowel obstruction or ileus. Normal appendix right lower quadrant. No wall thickening or inflammatory change. Vascular/Lymphatic: Aortic atherosclerosis. No enlarged abdominal or pelvic lymph nodes. Reproductive: Prostate is unremarkable. Other: No abdominal wall hernia or abnormality. No abdominopelvic ascites. Musculoskeletal: There are no acute displaced fractures. Specifically, no right anterior ninth rib fracture to correspond with the previous x-ray findings. Reconstructed images demonstrate no additional findings. IMPRESSION: 1. Stable 4 mm nonobstructing left renal calculus. 2. Stable mild diffuse hepatic steatosis. 3. No acute displaced fractures. Findings reported on prior rib series likely reflect superimposed shadows rather than  acute fracture. 4. Aortic Atherosclerosis (ICD10-I70.0). Electronically Signed   By: Randa Ngo M.D.   On: 10/24/2019 18:27     Assessment: 54 year old male admitted with UTI/pyelonephritis, reporting chronic rectal bleeding in setting of known prolapsing hemorrhoids. Chronic abdominal pain also reported at his baseline.   Rectal bleeding: resolved. Known Grade 4 internal hemorrhoids, with last colonoscopy 2018. Evaluated by Dr. Arnoldo Morale as outpatient with recommendations to see colorectal surgeon. Denies any exacerbation currently.   Long-standing RUQ abdominal pain: chronic, without postprandial component. Gallbladder absent. Waxes and wanes. Improved today. Continue PPI BID. Last EGD around 2010. Consider EGD as outpatient.   Fatty liver: on imaging. HFP normal.  Microcytic anemia: Hgb stable at 12.5 since admission. One year ago 13.4. Iron low at 19, ferritin low normal. Recommend EGD as outpatient. Last colonoscopy 2018 with surveillance due in 2023.   Plan: PPI BID Referral to colorectal surgeon as outpatient Anusol prn  Avoid straining, limit toilet time Recommend EGD in future: could complete as outpatient Will arrange outpatient follow-up with [facility] Follow peripherally and will see him as outpatient   Annitta Needs, PhD, ANP-BC [facility]    LOS: 1 day    10/26/2019, 8:21 AM

## 2019-10-26 NOTE — Progress Notes (Addendum)
Inpatient Diabetes Program Recommendations  AACE/ADA: New Consensus Statement on Inpatient Glycemic Control (2015)  Target Ranges:  Prepandial:   less than 140 mg/dL      Peak postprandial:   less than 180 mg/dL (1-2 hours)      Critically ill patients:  140 - 180 mg/dL   Results for [name] [name], [name] [name] (MRN [id number]) as of 10/26/2019 10:17  Ref. Range 10/24/2019 22:55 10/25/2019 09:12 10/25/2019 12:30 10/25/2019 16:04 10/25/2019 21:41  Glucose-Capillary Latest Ref Range: 70 - 99 mg/dL 188 (H) 297 (H)  8 units NOVOLOG  232 (H)  5 units NOVOLOG  264 (H)  8 units NOVOLOG  228 (H)  2 units NOVOLOG    Results for [name] [name], [name] [name] (MRN [id number]) as of 10/26/2019 10:17  Ref. Range 10/26/2019 07:57  Glucose-Capillary Latest Ref Range: 70 - 99 mg/dL 210 (H)  5 units NOVOLOG    Results for [name] [name], [name] [name] (MRN [id number]) as of 10/26/2019 10:17  Ref. Range 10/25/2019 00:44  Hemoglobin A1C Latest Ref Range: 4.8 - 5.6 % 10.3 (H)  (248 mg/dl)    Admit with: Pyelonephritis  History: DM  Home DM Meds: Amaryl 2 mg BID       Metformin 1000 mg BID       Januvia 100 mg QHS  Current Orders: Novolog Moderate Correction Scale/ SSI (0-15 units) TID AC + HS   PCP: Dr. Sharilyn Sites ([place])     Getting Cortef PO 40 mg Daily for Adrenal Insufficiency  Current A1c shows poor glucose control at home--Needs follow up with his PCP after d/c.    MD- Please consider adding basal insulin to current inpatient insulin regimen while home oral DM meds are on hold:  Lantus 14 units Daily (0.1 units/kg)    Addendum 1pm--Called pt by phone this afternoon (DM Coordinator working from [employer]).  Spoke with patient about his current A1c of 10.3%.  Explained what an A1c is and what it measures.  Reminded patient that his goal A1c is 7% or less per ADA standards to prevent both acute and long-term complications.  Explained to patient the extreme importance of good glucose control  at home.  Encouraged patient to check his CBGs at least bid at home (fasting and another check within the day) and to record all CBGs in a logbook for his PCP and Endocrinologist to review.  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  Has not been following diabetes diet for a while now.  Admits that he has been making poor food choices and drinking drinks with sugar the last 6 months.  Has not seen his Endocrinologist in over a year (Dr. Jacelyn Pi with [facility]).  Checks CBGs at home but usually only once per day if he remembers to check.  States his CBGs have been 200+ at home when he checks.  Pt told me he knows he needs to eat better and stop drinking sweet tea.  Encouraged pt to make follow up appt with his ENDO asap after d/c.  Pt thanked me for the call and told me he would work on getting his A1c down.  Did not have any additional questions for me.       --Will follow patient during hospitalization--  Wyn Quaker RN, MSN, CDE Diabetes Coordinator Inpatient Glycemic Control Team Team Pager: [phone] (8a-5p)

## 2019-10-26 NOTE — Progress Notes (Addendum)
PROGRESS NOTE  [name] [name] [id number] DOB: 04-Oct-1965 DOA: 10/24/2019 PCP: Sharilyn Sites, MD   Brief History:  54 year old male with a history of hypothyroidism, hepatic steatosis, hypertension, hyperlipidemia, depression presenting with right upper quadrant/right flank pain that has been going on for couple months.  However, he states that he really noticed it worsening for the last 2 weeks after he had a mechanical fall down 6 steps on his porch.  He went to urgent care on 10/16/2019.  Rib x-ray suggested possible ninth rib fracture at that time.  The patient was given ibuprofen and tramadol.  Nevertheless, the patient continued to have right upper quadrant and right flank pain.  He has had some intermittent dysuria and hematuria since that time.  In addition, the patient began developing hematochezia on the evening of 10/23/2019.  As result, he presented for further evaluation.  He has had some subjective fevers and chills but denied any chest pain, shortness of breath, coughing, hemoptysis, vomiting, frank diarrhea.  He has had some nausea.  He denies any new medications except his ibuprofen and tramadol.  Patient states that he was taking 4 tablets of Aleve on a weekly basis even before taking ibuprofen In the emergency department, the patient was afebrile hemodynamically stable with oxygen saturation 95% on room air.  BMP, LFTs were essentially unremarkable.  WBC was 12.4 with hemoglobin 12 point 107M platelets 527,000.  UA showed 21-50 WBC with 0-5 RBCs.  Hemoccult stool was negative.  The patient was started on IV ceftriaxone and IV fluids.  Assessment/Plan: UTI/pyelonephritis--EColi -This may be partly contributing to the patient's right flank pain -Suspect a major component of his right flank pain is musculoskeletal from his recent fall down 6 steps -10/24/2019 CT abd/pelvis--hepatic steatosis, s/p chole, s/p splenectomy, nonobstructive left renal calculus, normal bladder, no bowel  wall thickening or obstruction; no anterior right rib fracture noted; unremarkable right kidney -Continue ceftriaxone pending culture data  Hematochezia -Patient endorses hematochezia at home -FOBT was negative in the ED -Certainly, patient may have intermittent anorectal bleeding -Patient requesting GI consult-->outpt work up -02/25/2017 colonoscopy--grade 4 hemorrhoids not amenable to banding -baseline Hgb 12-13 -repeat FOBT -Hgb remains stable  Right upper quadrant/right flank pain -Suspect this is predominantly musculoskeletal -Judicious opioids -CT abdomen as discussed above -continue PPI  Essential hypertension -Holding losartan secondary to soft BP initially-->restart  Hyperglycemia/diabetes mellitus type 2 -Likely medication induced as the patient is on chronic Cortef -Hemoglobin A1c--10.3 -Holding metformin, Januvia, Amaryl -NovoLog sliding scale -start Lantus 14 units -long discussion with pt--he does not want to start insulin at this time-->follow up with PCP  Hyperlipidemia -Continue statin  Chronic pain syndrome/depression -continue Cymbalta and amitriptyline  Hypothyroidism -Continue Synthroid  Leukocytosis -likely stress demargination and UTI -CXR--personally reviewed--no edema or consolidation -follow cultures -check lactate 2.2 -PCT<0.10  Hypokalemia -replete -mag 1.9    Status is: Inpatient  Remains inpatient appropriate because:IV treatments appropriate due to intensity of illness or inability to take PO   Dispo: The patient is from: Home              Anticipated d/c is to: Home              Anticipated d/c date is: 1 day--5/25 if stable              Patient currently is not medically stable to d/c.              Family  Communication:  no Family at bedside  Consultants:  GI  Code Status:  FULL   DVT Prophylaxis: SCDs   Procedures: As Listed in Progress Note Above  Antibiotics: Ceftriaxone  5/22>>>      Subjective: abd pain in right side still present but improving  Objective: Vitals:   10/25/19 2225 10/26/19 0509 10/26/19 0819 10/26/19 1525  BP:  (!) 158/81  (!) 171/98  Pulse: 88 80  85  Resp: 20 18  20   Temp:  98.1 F (36.7 C)  98 F (36.7 C)  TempSrc:  Oral  Oral  SpO2: 94% 94% 95% 96%  Weight:      Height:        Intake/Output Summary (Last 24 hours) at 10/26/2019 1729 Last data filed at 10/26/2019 1700 Gross per 24 hour  Intake 1816.05 ml  Output 5100 ml  Net -3283.95 ml   Weight change:  Exam:   General:  Pt is alert, follows commands appropriately, not in acute distress  HEENT: No icterus, No thrush, No neck mass, [place]/AT  Cardiovascular: RRR, S1/S2, no rubs, no gallops  Respiratory: CTA bilaterally, no wheezing, no crackles, no rhonchi  Abdomen: Soft/+BS, RUq tender, non distended, no guarding  Extremities: No edema, No lymphangitis, No petechiae, No rashes, no synovitis   Data Reviewed: I have personally reviewed following labs and imaging studies Basic Metabolic Panel: Recent Labs  Lab 10/24/19 1702 10/25/19 0600 10/26/19 0636  NA 133* 136 135  K 3.9 3.6 3.4*  CL 98 101 98  CO2 24 22 25   GLUCOSE 289* 232* 238*  BUN 12 14 8   CREATININE 0.69 0.81 0.63  CALCIUM 8.9 8.9 8.7*  MG  --   --  1.9   Liver Function Tests: Recent Labs  Lab 10/24/19 1702 10/25/19 0600  AST 25 21  ALT 40 32  ALKPHOS 55 54  BILITOT 0.5 0.2*  PROT 7.6 7.1  ALBUMIN 3.7 3.6   Recent Labs  Lab 10/24/19 1702  LIPASE 18   No results for input(s): AMMONIA in the last 168 hours. Coagulation Profile: Recent Labs  Lab 10/24/19 1702  INR 1.0   CBC: Recent Labs  Lab 10/24/19 1702 10/25/19 0600 10/26/19 0636  WBC 18.4* 16.7* 12.6*  HGB 12.5* 11.5* 12.5*  HCT 40.2 36.3* 40.5  MCV 79.9* 79.8* 79.6*  PLT 527* 502* 562*   Cardiac Enzymes: No results for input(s): CKTOTAL, CKMB, CKMBINDEX, TROPONINI in the last 168 hours. BNP: Invalid  input(s): POCBNP CBG: Recent Labs  Lab 10/25/19 1604 10/25/19 2141 10/26/19 0757 10/26/19 1125 10/26/19 1641  GLUCAP 264* 228* 210* 324* 244*   HbA1C: Recent Labs    10/25/19 0044  HGBA1C 10.3*   Urine analysis:    Component Value Date/Time   COLORURINE YELLOW 10/24/2019 2100   APPEARANCEUR CLEAR 10/24/2019 2100   LABSPEC >1.046 (H) 10/24/2019 2100   PHURINE 6.0 10/24/2019 2100   GLUCOSEU >=500 (A) 10/24/2019 2100   HGBUR NEGATIVE 10/24/2019 2100   [place] NEGATIVE 10/24/2019 2100   [place] NEGATIVE 10/24/2019 2100   PROTEINUR NEGATIVE 10/24/2019 2100   UROBILINOGEN 0.2 12/16/2012 1820   NITRITE POSITIVE (A) 10/24/2019 2100   LEUKOCYTESUR NEGATIVE 10/24/2019 2100   Sepsis Labs: @LABRCNTIP (procalcitonin:4,lacticidven:4) ) Recent Results (from the past 240 hour(s))  Culture, Urine     Status: Abnormal (Preliminary result)   Collection Time: 10/24/19 10:15 PM   Specimen: Urine, Clean Catch  Result Value Ref Range Status   Specimen Description   Final    URINE,  CLEAN CATCH Performed at [facility], [address], [place], [place] [address]    Special Requests   Final    NONE Performed at [facility], [address] [place], [place] [address]    Culture (A)  Final    >=100,000 COLONIES/mL ESCHERICHIA COLI SUSCEPTIBILITIES TO FOLLOW Performed at [facility] [address], [place], [place] [address]    Report Status PENDING  Incomplete  SARS Coronavirus 2 by RT PCR (hospital order, performed in [facility] hospital lab) Nasopharyngeal Nasopharyngeal Swab     Status: None   Collection Time: 10/24/19 10:22 PM   Specimen: Nasopharyngeal Swab  Result Value Ref Range Status   SARS Coronavirus 2 NEGATIVE NEGATIVE Final    Comment: (NOTE) SARS-CoV-2 target nucleic acids are NOT DETECTED. The SARS-CoV-2 RNA is generally detectable in upper and lower respiratory specimens during the acute phase of infection. The lowest concentration of SARS-CoV-2 viral  copies this assay can detect is 250 copies / mL. A negative result does not preclude SARS-CoV-2 infection and should not be used as the sole basis for treatment or other patient management decisions.  A negative result may occur with improper specimen collection / handling, submission of specimen other than nasopharyngeal swab, presence of viral mutation(s) within the areas targeted by this assay, and inadequate number of viral copies (<250 copies / mL). A negative result must be combined with clinical observations, patient history, and epidemiological information. Fact Sheet for Patients:   StrictlyIdeas.no Fact Sheet for Healthcare Providers: BankingDealers.co.za This test is not yet approved or cleared  by the [place] FDA and has been authorized for detection and/or diagnosis of SARS-CoV-2 by FDA under an Emergency Use Authorization (EUA).  This EUA will remain in effect (meaning this test can be used) for the duration of the COVID-19 declaration under Section 564(b)(1) of the Act, 21 U.S.C. section 360bbb-3(b)(1), unless the authorization is terminated or revoked sooner. Performed at [facility], [address] [place], [place] [address]   Culture, blood (routine x 2)     Status: None (Preliminary result)   Collection Time: 10/25/19 12:44 AM   Specimen: BLOOD LEFT HAND  Result Value Ref Range Status   Specimen Description BLOOD LEFT HAND  Final   Special Requests   Final    BOTTLES DRAWN AEROBIC AND ANAEROBIC Blood Culture adequate volume   Culture   Final    NO GROWTH 1 DAY Performed at [facility], [address], [place], [place] [address]    Report Status PENDING  Incomplete  Culture, blood (routine x 2)     Status: None (Preliminary result)   Collection Time: 10/25/19  8:56 AM   Specimen: BLOOD LEFT HAND  Result Value Ref Range Status   Specimen Description BLOOD LEFT HAND  Final   Special Requests   Final     BOTTLES DRAWN AEROBIC AND ANAEROBIC Blood Culture adequate volume   Culture   Final    NO GROWTH < 24 HOURS Performed at [facility], [address] [place], [place] [address]    Report Status PENDING  Incomplete     Scheduled Meds:  amitriptyline  10 mg Oral QHS   aspirin EC  81 mg Oral QPM   DULoxetine  120 mg Oral QHS   hydrocortisone  40 mg Oral Daily   insulin aspart  0-15 Units Subcutaneous TID WC   insulin aspart  0-5 Units Subcutaneous QHS   insulin glargine  14 Units Subcutaneous Daily  levothyroxine  137 mcg Oral Daily   pantoprazole (PROTONIX) IV  40 mg Intravenous Q12H   simvastatin  40 mg Oral QHS   tamsulosin  0.4 mg Oral Daily   Continuous Infusions:  sodium chloride 100 mL/hr at 10/26/19 0839   cefTRIAXone (ROCEPHIN)  IV 1 g (10/25/19 2124)    Procedures/Studies: DG Chest 2 View  Result Date: 10/25/2019 CLINICAL DATA:  Chest pain EXAM: CHEST - 2 VIEW COMPARISON:  10/09/2019 FINDINGS: The heart size and mediastinal contours are within normal limits. Both lungs are clear. The visualized skeletal structures are unremarkable. IMPRESSION: No active cardiopulmonary disease. Electronically Signed   By: Inez Catalina M.D.   On: 10/25/2019 11:30   DG Ribs Unilateral W/Chest Right  Result Date: 10/09/2019 CLINICAL DATA:  [address] off porch rib pain EXAM: RIGHT RIBS AND CHEST - 3+ VIEW COMPARISON:  08/10/2016 FINDINGS: Single-view chest demonstrates low lung volumes. Chronic elevation of right diaphragm. Right rib series demonstrates probable acute right ninth anterolateral rib fracture. IMPRESSION: 1. Negative for pneumothorax or pleural effusion 2. Probable acute right ninth rib fracture Electronically Signed   By: Donavan Foil M.D.   On: 10/09/2019 20:25   CT ABDOMEN PELVIS W CONTRAST  Result Date: 10/24/2019 CLINICAL DATA:  Right upper abdominal pain radiating to back, fell 10/09/2019 with a right rib fracture EXAM: CT ABDOMEN AND PELVIS WITH CONTRAST  TECHNIQUE: Multidetector CT imaging of the abdomen and pelvis was performed using the standard protocol following bolus administration of intravenous contrast. CONTRAST:  166mL OMNIPAQUE IOHEXOL 300 MG/ML  SOLN COMPARISON:  11/12/2017, 10/09/2019 FINDINGS: Lower chest: No acute pleural or parenchymal lung disease. Hepatobiliary: Stable mild diffuse hepatic steatosis. No focal liver abnormality. Gallbladder is surgically absent. Pancreas: Unremarkable. No pancreatic ductal dilatation or surrounding inflammatory changes. Spleen: Surgically absent Adrenals/Urinary Tract: Stable 4 mm nonobstructing left renal calculus. Stable left renal cortical cyst. Right kidney is unremarkable. The adrenals are normal. Bladder is grossly normal. Stomach/Bowel: No bowel obstruction or ileus. Normal appendix right lower quadrant. No wall thickening or inflammatory change. Vascular/Lymphatic: Aortic atherosclerosis. No enlarged abdominal or pelvic lymph nodes. Reproductive: Prostate is unremarkable. Other: No abdominal wall hernia or abnormality. No abdominopelvic ascites. Musculoskeletal: There are no acute displaced fractures. Specifically, no right anterior ninth rib fracture to correspond with the previous x-ray findings. Reconstructed images demonstrate no additional findings. IMPRESSION: 1. Stable 4 mm nonobstructing left renal calculus. 2. Stable mild diffuse hepatic steatosis. 3. No acute displaced fractures. Findings reported on prior rib series likely reflect superimposed shadows rather than acute fracture. 4. Aortic Atherosclerosis (ICD10-I70.0). Electronically Signed   By: Randa Ngo M.D.   On: 10/24/2019 18:27    Orson Eva, DO  [facility]  If 7PM-7AM, please contact night-coverage [email or web address] Password [email or web address] 10/26/2019, 5:29 PM   LOS: 1 day

## 2019-10-26 NOTE — Telephone Encounter (Signed)
Erline Levine, please arrange hospital follow-up in next 2-4 weeks.

## 2019-10-27 ENCOUNTER — Encounter: Payer: Self-pay | Admitting: Internal Medicine

## 2019-10-27 DIAGNOSIS — N10 Acute tubulo-interstitial nephritis: Principal | ICD-10-CM

## 2019-10-27 LAB — CBC
HCT: 40.9 % (ref 39.0–52.0)
Hemoglobin: 12.7 g/dL — ABNORMAL LOW (ref 13.0–17.0)
MCH: 24.7 pg — ABNORMAL LOW (ref 26.0–34.0)
MCHC: 31.1 g/dL (ref 30.0–36.0)
MCV: 79.4 fL — ABNORMAL LOW (ref 80.0–100.0)
Platelets: 570 10*3/uL — ABNORMAL HIGH (ref 150–400)
RBC: 5.15 MIL/uL (ref 4.22–5.81)
RDW: 17.4 % — ABNORMAL HIGH (ref 11.5–15.5)
WBC: 11.4 10*3/uL — ABNORMAL HIGH (ref 4.0–10.5)
nRBC: 0 % (ref 0.0–0.2)

## 2019-10-27 LAB — GLUCOSE, CAPILLARY
Glucose-Capillary: 242 mg/dL — ABNORMAL HIGH (ref 70–99)
Glucose-Capillary: 258 mg/dL — ABNORMAL HIGH (ref 70–99)

## 2019-10-27 LAB — BASIC METABOLIC PANEL
Anion gap: 14 (ref 5–15)
BUN: 12 mg/dL (ref 6–20)
CO2: 24 mmol/L (ref 22–32)
Calcium: 8.7 mg/dL — ABNORMAL LOW (ref 8.9–10.3)
Chloride: 98 mmol/L (ref 98–111)
Creatinine, Ser: 0.64 mg/dL (ref 0.61–1.24)
GFR calc Af Amer: >60 mL/min (ref >60)
GFR calc non Af Amer: >60 mL/min (ref >60)
Glucose, Bld: 263 mg/dL — ABNORMAL HIGH (ref 70–99)
Potassium: 3.6 mmol/L (ref 3.5–5.1)
Sodium: 136 mmol/L (ref 135–145)

## 2019-10-27 LAB — URINE CULTURE: Culture: >=100000 — AB

## 2019-10-27 LAB — MAGNESIUM: Magnesium: 2 mg/dL (ref 1.7–2.4)

## 2019-10-27 LAB — LACTIC ACID, PLASMA: Lactic Acid, Venous: 1.6 mmol/L (ref 0.5–1.9)

## 2019-10-27 MED ORDER — AMOXICILLIN 500 MG PO CAPS: 500.0000 mg | ORAL_CAPSULE | Freq: Three times a day (TID) | ORAL | 0 refills | Status: DC

## 2019-10-27 MED ORDER — AMOXICILLIN 250 MG PO CAPS
500.0000 mg | ORAL_CAPSULE | Freq: Three times a day (TID) | ORAL | Status: DC
  Administered 2019-10-27: 500 mg via ORAL
  Filled 2019-10-27: qty 2

## 2019-10-27 NOTE — Telephone Encounter (Signed)
PATIENT SCHEDULED FOR FOLLOW UP AND LETTER SENT

## 2019-10-27 NOTE — Discharge Summary (Signed)
Physician Discharge Summary  [name] [name] [id number] DOB: [birth year] DOA: 10/24/2019  PCP: Sharilyn Sites, MD  Admit date: 10/24/2019 Discharge date: 10/27/2019  Admitted From: Home Disposition:  Home   Recommendations for Outpatient Follow-up:  1. Follow up with PCP in 1-2 weeks 2. Please obtain BMP/CBC in one week   Discharge Condition: Stable CODE STATUS: FULL Diet recommendation: Heart Healthy / Carb Modified   Brief/Interim Summary: 54 year old male with a history of hypothyroidism, hepatic steatosis, hypertension, hyperlipidemia, depression presenting with right upper quadrant/right flank pain that has been going on for couple months. However, he states that he really noticed it worsening for the last 2 weeks after he had a mechanical fall down 6 steps on his porch. He went to urgent care on 10/16/2019. Rib x-ray suggested possible ninth rib fracture at that time. The patient was given ibuprofen and tramadol. Nevertheless, the patient continued to have right upper quadrant and right flank pain. He has had some intermittent dysuria and hematuria since that time. In addition, the patient began developing hematochezia on the evening of 10/23/2019. As result, he presented for further evaluation. He has had some subjective fevers and chills but denied any chest pain, shortness of breath, coughing, hemoptysis, vomiting, frank diarrhea. He has had some nausea. He denies any new medications except his ibuprofen and tramadol. Patient states that he was taking 4 tablets of Aleve on a weekly basis even before taking ibuprofen In the emergency department, the patient was afebrile hemodynamically stable with oxygen saturation 95% on room air. BMP, LFTs were essentially unremarkable.WBC was 12.4 with hemoglobin 12 point 36M platelets 527,000. UA showed 21-50 WBC with 0-5 RBCs.Hemoccult stool was negative. The patient was started on IV ceftriaxone and IV fluids.  Discharge Diagnoses:    UTI/pyelonephritis--EColi -This may be partly contributing to the patient's right flank pain -Suspect a major component of his right flank pain is musculoskeletal from his recent fall down 6 steps -10/24/2019 CT abd/pelvis--hepatic steatosis, s/p chole,s/psplenectomy, nonobstructive left renal calculus, normal bladder, no bowel wall thickening or obstruction;no anterior right rib fracture noted;unremarkable right kidney -Continue ceftriaxone pending culture data->home with amoxil x 4 more days  Hematochezia -Patient endorses hematochezia at home -FOBT was negative in the ED -Certainly, patient may have intermittent anorectal bleeding -Patient requesting GI consult-->outpt work up -02/25/2017 colonoscopy--grade 4 hemorrhoids not amenable to banding -baseline Hgb 12-13 -repeat FOBT--no BM to collect -Hgb remains stable  Right upper quadrant/right flank pain -Suspect this is predominantly musculoskeletal -Judicious opioids -CT abdomen as discussed above -continue PPI  Essential hypertension -Holding losartan secondary to soft BP initially-->restart  Hyperglycemia/diabetes mellitus type 2 -Likely medication induced as the patient is on chronic Cortef -Hemoglobin A1c--10.3 -Holding metformin, Januvia, Amaryl -NovoLog sliding scale -start Lantus 14 units -long discussion with pt--he does not want to start insulin at this time-->follow up with PCP  Hyperlipidemia -Continue statin  Chronic pain syndrome/depression -continue Cymbalta and amitriptyline  Hypothyroidism -Continue Synthroid  Leukocytosis -likely stress demargination and UTI -CXR--personally reviewed--no edema or consolidation -follow cultures -check lactate 2.2 -PCT<0.10  Hypokalemia -replete -mag 1.9  Morbid Obesity -BMI 48.8 -lifestyle modification  Discharge Instructions   Allergies as of 10/27/2019   No Known Allergies     Medication List    STOP taking these medications     ibuprofen 800 MG tablet Commonly known as: ADVIL     TAKE these medications   amitriptyline 10 MG tablet Commonly known as: ELAVIL Take 10 mg by mouth at bedtime.   amoxicillin  500 MG capsule Commonly known as: AMOXIL Take 1 capsule (500 mg total) by mouth every 8 (eight) hours.   aspirin EC 81 MG tablet Take 81 mg by mouth every evening.   DULoxetine 60 MG capsule Commonly known as: CYMBALTA Take 120 mg by mouth at bedtime.   glimepiride 2 MG tablet Commonly known as: AMARYL Take 2 mg by mouth 2 (two) times daily.   hydrocortisone 20 MG tablet Commonly known as: CORTEF Take 0.5 tablets (10 mg total) by mouth daily. For cortisol replacement What changed: how much to take   levothyroxine 137 MCG tablet Commonly known as: SYNTHROID Take 137 mcg by mouth every morning.   losartan 100 MG tablet Commonly known as: COZAAR Take 100 mg by mouth daily.   Mens Multivitamin Plus Tabs Take 1 tablet by mouth daily. /For nutritional supplementation.   metFORMIN 1000 MG tablet Commonly known as: GLUCOPHAGE Take 1,000 mg by mouth 2 (two) times daily.   OMEGA-3 FISH OIL PO Take 1 capsule by mouth at bedtime.   omeprazole 40 MG capsule Commonly known as: PRILOSEC Take 1 capsule (40 mg total) by mouth daily. What changed: when to take this   simvastatin 40 MG tablet Commonly known as: ZOCOR Take 40 mg by mouth at bedtime.   sitaGLIPtin 100 MG tablet Commonly known as: JANUVIA Take 100 mg by mouth at bedtime.   tamsulosin 0.4 MG Caps capsule Commonly known as: FLOMAX Take 0.4 mg by mouth in the morning.   Vitamin D3 125 MCG (5000 UT) Caps Take 1 capsule by mouth 2 (two) times a week.       No Known Allergies  Consultations:  GI   Procedures/Studies: DG Chest 2 View  Result Date: 10/25/2019 CLINICAL DATA:  Chest pain EXAM: CHEST - 2 VIEW COMPARISON:  10/09/2019 FINDINGS: The heart size and mediastinal contours are within normal limits. Both lungs are  clear. The visualized skeletal structures are unremarkable. IMPRESSION: No active cardiopulmonary disease. Electronically Signed   By: Inez Catalina M.D.   On: 10/25/2019 11:30   DG Ribs Unilateral W/Chest Right  Result Date: 10/09/2019 CLINICAL DATA:  [place] off porch rib pain EXAM: RIGHT RIBS AND CHEST - 3+ VIEW COMPARISON:  08/10/2016 FINDINGS: Single-view chest demonstrates low lung volumes. Chronic elevation of right diaphragm. Right rib series demonstrates probable acute right ninth anterolateral rib fracture. IMPRESSION: 1. Negative for pneumothorax or pleural effusion 2. Probable acute right ninth rib fracture Electronically Signed   By: Donavan Foil M.D.   On: 10/09/2019 20:25   CT ABDOMEN PELVIS W CONTRAST  Result Date: 10/24/2019 CLINICAL DATA:  Right upper abdominal pain radiating to back, fell 10/09/2019 with a right rib fracture EXAM: CT ABDOMEN AND PELVIS WITH CONTRAST TECHNIQUE: Multidetector CT imaging of the abdomen and pelvis was performed using the standard protocol following bolus administration of intravenous contrast. CONTRAST:  176mL OMNIPAQUE IOHEXOL 300 MG/ML  SOLN COMPARISON:  11/12/2017, 10/09/2019 FINDINGS: Lower chest: No acute pleural or parenchymal lung disease. Hepatobiliary: Stable mild diffuse hepatic steatosis. No focal liver abnormality. Gallbladder is surgically absent. Pancreas: Unremarkable. No pancreatic ductal dilatation or surrounding inflammatory changes. Spleen: Surgically absent Adrenals/Urinary Tract: Stable 4 mm nonobstructing left renal calculus. Stable left renal cortical cyst. Right kidney is unremarkable. The adrenals are normal. Bladder is grossly normal. Stomach/Bowel: No bowel obstruction or ileus. Normal appendix right lower quadrant. No wall thickening or inflammatory change. Vascular/Lymphatic: Aortic atherosclerosis. No enlarged abdominal or pelvic lymph nodes. Reproductive: Prostate is unremarkable. Other: No abdominal  wall hernia or abnormality. No  abdominopelvic ascites. Musculoskeletal: There are no acute displaced fractures. Specifically, no right anterior ninth rib fracture to correspond with the previous x-ray findings. Reconstructed images demonstrate no additional findings. IMPRESSION: 1. Stable 4 mm nonobstructing left renal calculus. 2. Stable mild diffuse hepatic steatosis. 3. No acute displaced fractures. Findings reported on prior rib series likely reflect superimposed shadows rather than acute fracture. 4. Aortic Atherosclerosis (ICD10-I70.0). Electronically Signed   By: Randa Ngo M.D.   On: 10/24/2019 18:27         Discharge Exam: Vitals:   10/26/19 2200 10/27/19 0523  BP:  (!) 161/91  Pulse:  78  Resp: 18 18  Temp:  97.7 F (36.5 C)  SpO2: 94% 98%   Vitals:   10/26/19 1525 10/26/19 2056 10/26/19 2200 10/27/19 0523  BP: (!) 171/98 (!) 175/107  (!) 161/91  Pulse: 85 91  78  Resp: 20 20 18 18   Temp: 98 F (36.7 C) 98.5 F (36.9 C)  97.7 F (36.5 C)  TempSrc: Oral Oral  Oral  SpO2: 96% 98% 94% 98%  Weight:      Height:        General: Pt is alert, awake, not in acute distress Cardiovascular: RRR, S1/S2 +, no rubs, no gallops Respiratory: CTA bilaterally, no wheezing, no rhonchi Abdominal: Soft, NT, ND, bowel sounds + Extremities: no edema, no cyanosis   The results of significant diagnostics from this hospitalization (including imaging, microbiology, ancillary and laboratory) are listed below for reference.    Significant Diagnostic Studies: DG Chest 2 View  Result Date: 10/25/2019 CLINICAL DATA:  Chest pain EXAM: CHEST - 2 VIEW COMPARISON:  10/09/2019 FINDINGS: The heart size and mediastinal contours are within normal limits. Both lungs are clear. The visualized skeletal structures are unremarkable. IMPRESSION: No active cardiopulmonary disease. Electronically Signed   By: Inez Catalina M.D.   On: 10/25/2019 11:30   DG Ribs Unilateral W/Chest Right  Result Date: 10/09/2019 CLINICAL DATA:  [place] [address] off  porch rib pain EXAM: RIGHT RIBS AND CHEST - 3+ VIEW COMPARISON:  08/10/2016 FINDINGS: Single-view chest demonstrates low lung volumes. Chronic elevation of right diaphragm. Right rib series demonstrates probable acute right ninth anterolateral rib fracture. IMPRESSION: 1. Negative for pneumothorax or pleural effusion 2. Probable acute right ninth rib fracture Electronically Signed   By: Donavan Foil M.D.   On: 10/09/2019 20:25   CT ABDOMEN PELVIS W CONTRAST  Result Date: 10/24/2019 CLINICAL DATA:  Right upper abdominal pain radiating to back, fell 10/09/2019 with a right rib fracture EXAM: CT ABDOMEN AND PELVIS WITH CONTRAST TECHNIQUE: Multidetector CT imaging of the abdomen and pelvis was performed using the standard protocol following bolus administration of intravenous contrast. CONTRAST:  144mL OMNIPAQUE IOHEXOL 300 MG/ML  SOLN COMPARISON:  11/12/2017, 10/09/2019 FINDINGS: Lower chest: No acute pleural or parenchymal lung disease. Hepatobiliary: Stable mild diffuse hepatic steatosis. No focal liver abnormality. Gallbladder is surgically absent. Pancreas: Unremarkable. No pancreatic ductal dilatation or surrounding inflammatory changes. Spleen: Surgically absent Adrenals/Urinary Tract: Stable 4 mm nonobstructing left renal calculus. Stable left renal cortical cyst. Right kidney is unremarkable. The adrenals are normal. Bladder is grossly normal. Stomach/Bowel: No bowel obstruction or ileus. Normal appendix right lower quadrant. No wall thickening or inflammatory change. Vascular/Lymphatic: Aortic atherosclerosis. No enlarged abdominal or pelvic lymph nodes. Reproductive: Prostate is unremarkable. Other: No abdominal wall hernia or abnormality. No abdominopelvic ascites. Musculoskeletal: There are no acute displaced fractures. Specifically, no right anterior ninth rib fracture to correspond  with the previous x-ray findings. Reconstructed images demonstrate no additional findings. IMPRESSION: 1. Stable 4 mm  nonobstructing left renal calculus. 2. Stable mild diffuse hepatic steatosis. 3. No acute displaced fractures. Findings reported on prior rib series likely reflect superimposed shadows rather than acute fracture. 4. Aortic Atherosclerosis (ICD10-I70.0). Electronically Signed   By: Randa Ngo M.D.   On: 10/24/2019 18:27     Microbiology: Recent Results (from the past 240 hour(s))  Culture, Urine     Status: Abnormal   Collection Time: 10/24/19 10:15 PM   Specimen: Urine, Clean Catch  Result Value Ref Range Status   Specimen Description   Final    URINE, CLEAN CATCH Performed at [facility], [address], [place], [place] [address]    Special Requests   Final    NONE Performed at [facility], [address] [place], [place] [address]    Culture >=100,000 COLONIES/mL ESCHERICHIA COLI (A)  Final   Report Status 10/27/2019 FINAL  Final   Organism ID, Bacteria ESCHERICHIA COLI (A)  Final      Susceptibility   Escherichia coli - MIC*    AMPICILLIN <=2 SENSITIVE Sensitive     CEFAZOLIN <=4 SENSITIVE Sensitive     CEFTRIAXONE <=1 SENSITIVE Sensitive     CIPROFLOXACIN <=0.25 SENSITIVE Sensitive     GENTAMICIN <=1 SENSITIVE Sensitive     IMIPENEM <=0.25 SENSITIVE Sensitive     NITROFURANTOIN <=16 SENSITIVE Sensitive     TRIMETH/SULFA <=20 SENSITIVE Sensitive     AMPICILLIN/SULBACTAM <=2 SENSITIVE Sensitive     PIP/TAZO <=4 SENSITIVE Sensitive     * >=100,000 COLONIES/mL ESCHERICHIA COLI  SARS Coronavirus 2 by RT PCR (hospital order, performed in [facility] hospital lab) Nasopharyngeal Nasopharyngeal Swab     Status: None   Collection Time: 10/24/19 10:22 PM   Specimen: Nasopharyngeal Swab  Result Value Ref Range Status   SARS Coronavirus 2 NEGATIVE NEGATIVE Final    Comment: (NOTE) SARS-CoV-2 target nucleic acids are NOT DETECTED. The SARS-CoV-2 RNA is generally detectable in upper and lower respiratory specimens during the acute phase of infection. The lowest concentration of  SARS-CoV-2 viral copies this assay can detect is 250 copies / mL. A negative result does not preclude SARS-CoV-2 infection and should not be used as the sole basis for treatment or other patient management decisions.  A negative result may occur with improper specimen collection / handling, submission of specimen other than nasopharyngeal swab, presence of viral mutation(s) within the areas targeted by this assay, and inadequate number of viral copies (<250 copies / mL). A negative result must be combined with clinical observations, patient history, and epidemiological information. Fact Sheet for Patients:   StrictlyIdeas.no Fact Sheet for Healthcare Providers: BankingDealers.co.za This test is not yet approved or cleared  by the [place] FDA and has been authorized for detection and/or diagnosis of SARS-CoV-2 by FDA under an Emergency Use Authorization (EUA).  This EUA will remain in effect (meaning this test can be used) for the duration of the COVID-19 declaration under Section 564(b)(1) of the Act, 21 U.S.C. section 360bbb-3(b)(1), unless the authorization is terminated or revoked sooner. Performed at [facility], [address], [place], [place] [address]   Culture, blood (routine x 2)     Status: None (Preliminary result)   Collection Time: 10/25/19 12:44 AM   Specimen: BLOOD LEFT HAND  Result Value Ref Range Status   Specimen Description BLOOD LEFT HAND  Final   Special Requests   Final  BOTTLES DRAWN AEROBIC AND ANAEROBIC Blood Culture adequate volume   Culture   Final    NO GROWTH 2 DAYS Performed at [facility], [address], [place], [place] [address]    Report Status PENDING  Incomplete  Culture, blood (routine x 2)     Status: None (Preliminary result)   Collection Time: 10/25/19  8:56 AM   Specimen: BLOOD LEFT HAND  Result Value Ref Range Status   Specimen Description BLOOD LEFT HAND  Final   Special Requests    Final    BOTTLES DRAWN AEROBIC AND ANAEROBIC Blood Culture adequate volume   Culture   Final    NO GROWTH 2 DAYS Performed at [facility], [address] [place], [place] [address]    Report Status PENDING  Incomplete     Labs: Basic Metabolic Panel: Recent Labs  Lab 10/24/19 1702 10/24/19 1702 10/25/19 0600 10/25/19 0600 10/26/19 0636 10/27/19 0538  NA 133*  --  136  --  135 136  K 3.9   < > 3.6   < > 3.4* 3.6  CL 98  --  101  --  98 98  CO2 24  --  22  --  25 24  GLUCOSE 289*  --  232*  --  238* 263*  BUN 12  --  14  --  8 12  CREATININE 0.69  --  0.81  --  0.63 0.64  CALCIUM 8.9  --  8.9  --  8.7* 8.7*  MG  --   --   --   --  1.9 2.0   < > = values in this interval not displayed.   Liver Function Tests: Recent Labs  Lab 10/24/19 1702 10/25/19 0600  AST 25 21  ALT 40 32  ALKPHOS 55 54  BILITOT 0.5 0.2*  PROT 7.6 7.1  ALBUMIN 3.7 3.6   Recent Labs  Lab 10/24/19 1702  LIPASE 18   No results for input(s): AMMONIA in the last 168 hours. CBC: Recent Labs  Lab 10/24/19 1702 10/25/19 0600 10/26/19 0636 10/27/19 0538  WBC 18.4* 16.7* 12.6* 11.4*  HGB 12.5* 11.5* 12.5* 12.7*  HCT 40.2 36.3* 40.5 40.9  MCV 79.9* 79.8* 79.6* 79.4*  PLT 527* 502* 562* 570*   Cardiac Enzymes: No results for input(s): CKTOTAL, CKMB, CKMBINDEX, TROPONINI in the last 168 hours. BNP: Invalid input(s): POCBNP CBG: Recent Labs  Lab 10/26/19 0757 10/26/19 1125 10/26/19 1641 10/26/19 2022 10/27/19 0808  GLUCAP 210* 324* 244* 228* 242*    Time coordinating discharge:  36 minutes  Signed:  Orson Eva, DO [facility] Pager: [phone] 10/27/2019, 10:32 AM

## 2019-10-30 LAB — CULTURE, BLOOD (ROUTINE X 2)
Culture: NO GROWTH
Culture: NO GROWTH
Special Requests: ADEQUATE
Special Requests: ADEQUATE

## 2019-11-30 ENCOUNTER — Encounter: Payer: Self-pay | Admitting: Gastroenterology

## 2019-11-30 NOTE — Progress Notes (Deleted)
Referring Provider: Sharilyn Sites, MD Primary Care Physician:  Sharilyn Sites, MD Primary GI Physician: Dr. Rayne Du chief complaint on file.   HPI:   [name] [name] is a 54 y.o. male presenting today for hospital follow-up.   Patient was admitted 10/24/2019-10/27/2019 with acute pyelonephritis/UTI.  Symptoms at time of presentation included RUQ/right flank pain present for couple of months, worsening for the last 2 weeks after having a fall down 6 steps with possible ninth rib fracture.  Taking ibuprofen and tramadol.  Also noted dysuria, hematuria, and hematochezia starting 10/23/2019 and therefore presented to the emergency room.  Overall, suspected RUQ/right flank pain was predominantly musculoskeletal.  He was treated with ceftriaxone and sent home with amoxicillin for pyelonephritis/UTI secondary to E. coli.  Regarding hematochezia, hemoglobin remained stable although slightly low at 12.5, 13.4 1-year ago, iron low at 19, ferritin low normal. FOBT negative in the ED, no overt GI bleeding while inpatient.  GI was consulted for hematochezia and anemia.  Patient noted chronic rectal bleeding in the setting of known prolapsing hemorrhoids.  Last colonoscopy in September 2018 with grade 4 hemorrhoids not amenable to banding,evaluated by Dr. Arnoldo Morale as an outpatient with recommendations to see colorectal surgeon.  Again recommended outpatient referral to colorectal surgeon, Anusol as needed.  Patient also noted longstanding intermittent RUQ abdominal pain without postprandial component.  Gallbladder absent.  Recommended continuing PPI twice daily.  Recommended EGD as an outpatient for further evaluation of anemia as well as chronic RUQ abdominal pain.  Today:   Past Medical History:  Diagnosis Date  . Arthritis   . Asthma    as child  . Back pain, chronic 02/18/11   BULGING DISK  . Complication of anesthesia    "woke up during spleenectomy at [facility]".  . Diabetes mellitus without complication  ([place])   . GERD (gastroesophageal reflux disease)    erosive reflux esophagitis, small HH  . Hemorrhoids   . History of kidney stones   . Hyperlipemia   . Hypertension   . Hypothyroidism   . IBS (irritable bowel syndrome)   . Low serum cortisol level (HCC)    Dr Suzette Battiest  . Lower extremity edema   . Nephrolithiasis 12/19/2012  . Plantar fasciitis   . Restless leg syndrome   . S/P colonoscopy 08/2008   int/ext hemorrhoids. friable, rectal polyp  . S/P endoscopy 01/10/09   Dr Vivi Ferns, small HH  . Sleep apnea    cpap  . Spinal stenosis of lumbar region 12/18/2012  . Thyroid disease   . Tubular adenoma of colon 2000  . UTI (lower urinary tract infection)     Past Surgical History:  Procedure Laterality Date  . CHOLECYSTECTOMY  2006  . COLONOSCOPY  2000   Dr. Gala Romney- tubular adenoma  . COLONOSCOPY  08/10/2008   Dr. Gala Romney-  IMPRESSION:  Prominent external anal canal hemorrhoids, hyperplastic polyp  . COLONOSCOPY N/A 10/08/2013   [id number] colonic polyps -  removed as described above/Prominent grade 3 hemorrhoids -  likely source of hematochezia. tubular adenomas. next TCS 10/2016  . COLONOSCOPY WITH PROPOFOL N/A 02/25/2017   Procedure: COLONOSCOPY WITH PROPOFOL;  Surgeon: Daneil Dolin, MD;  [facility];  Service: Endoscopy;  Laterality: N/A;  8:30am  . CYSTOSCOPY     with utetheral dilation  . DISTAL BICEPS TENDON REPAIR  02/20/2012   Procedure: DISTAL BICEPS TENDON REPAIR;  Surgeon: Lorn Junes, MD;  Location: [place];  Service: Orthopedics;  Laterality: Left;  left elbow distal biceps tendon repair  . ESOPHAGOGASTRODUODENOSCOPY  2010   Dr. Gala Romney: normal  . FLEXIBLE SIGMOIDOSCOPY N/A 11/10/2013   Dr.Rourk- internal/external hemorrhoids. external hemorrhoids grade 3 with some residual prolapsing tissue. normal rectum aside form hemorrhoids. s/p band placement.  Marland Kitchen HEMORRHOID BANDING N/A 11/10/2013   Procedure: HEMORRHOID BANDING;  Surgeon: Daneil Dolin, MD;  [facility];  Service: Endoscopy;  Laterality: N/A;  . HERNIA REPAIR    . INCISIONAL HERNIA REPAIR N/A 04/10/2017   Procedure: Fatima Blank HERNIORRHAPHY WITH MESH;  Surgeon: Aviva Signs, MD;  Location: [facility];  Service: General;  Laterality: N/A;  . KNEE SURGERY     left  . POLYPECTOMY  02/25/2017   Procedure: POLYPECTOMY - ASCENDING COLON;  Surgeon: Daneil Dolin, MD;  [facility];  Service: Endoscopy;;  . SPLENECTOMY  2003   itp    Current Outpatient Medications  Medication Sig Dispense Refill  . amitriptyline (ELAVIL) 10 MG tablet Take 10 mg by mouth at bedtime.     Marland Kitchen amoxicillin (AMOXIL) 500 MG capsule Take 1 capsule (500 mg total) by mouth every 8 (eight) hours. 12 capsule 0  . aspirin EC 81 MG tablet Take 81 mg by mouth every evening.     . Cholecalciferol (VITAMIN D3) 125 MCG (5000 UT) CAPS Take 1 capsule by mouth 2 (two) times a week.    . DULoxetine (CYMBALTA) 60 MG capsule Take 120 mg by mouth at bedtime.     Marland Kitchen glimepiride (AMARYL) 2 MG tablet Take 2 mg by mouth 2 (two) times daily.    . hydrocortisone (CORTEF) 20 MG tablet Take 0.5 tablets (10 mg total) by mouth daily. For cortisol replacement (Patient taking differently: Take 40 mg by mouth daily. For cortisol replacement) 15 tablet 0  . levothyroxine (SYNTHROID) 137 MCG tablet Take 137 mcg by mouth every morning.    Marland Kitchen losartan (COZAAR) 100 MG tablet Take 100 mg by mouth daily.  0  . metFORMIN (GLUCOPHAGE) 1000 MG tablet Take 1,000 mg by mouth 2 (two) times daily.  5  . Multiple Vitamins-Minerals (MENS MULTIVITAMIN PLUS) TABS Take 1 tablet by mouth daily. /For nutritional supplementation. 30 tablet 0  . Omega-3 Fatty Acids (OMEGA-3 FISH OIL PO) Take 1 capsule by mouth at bedtime.     Marland Kitchen omeprazole (PRILOSEC) 40 MG capsule Take 1 capsule (40 mg total) by mouth daily. (Patient taking differently: Take 40 mg by mouth in the morning. ) 30 capsule 5  . simvastatin (ZOCOR) 40 MG tablet Take 40 mg  by mouth at bedtime.   0  . sitaGLIPtin (JANUVIA) 100 MG tablet Take 100 mg by mouth at bedtime.     . tamsulosin (FLOMAX) 0.4 MG CAPS capsule Take 0.4 mg by mouth in the morning.      No current facility-administered medications for this visit.    Allergies as of 12/02/2019  . (No Known Allergies)    Family History  Problem Relation Age of Onset  . Colon cancer Father 55       deceased 6 months after diagnosis  . Diabetes Brother     Social History   Socioeconomic History  . Marital status: Married    Spouse name: Not on file  . Number of children: 1  . Years of education: Not on file  . Highest education level: Not on file  Occupational History  . Occupation: truck Aeronautical engineer: [employer]  . Smoking status: Former Smoker    Packs/day: 0.50    Years: 30.00    Pack years: 15.00    Types: Cigarettes    Quit date: 03/05/2007    Years since quitting: 12.7  . Smokeless tobacco: Never Used  Vaping Use  . Vaping Use: Never used  Substance and Sexual Activity  . Alcohol use: No    Comment: 2008  . Drug use: No    Comment: 2008  . Sexual activity: Yes  Other Topics Concern  . Not on file  Social History Narrative   1 healthy son   Social Determinants of Health   Financial Resource Strain:   . Difficulty of Paying Living Expenses:   Food Insecurity:   . Worried About Charity fundraiser in the Last Year:   . Arboriculturist in the Last Year:   Transportation Needs:   . Film/video editor (Medical):   Marland Kitchen Lack of Transportation (Non-Medical):   Physical Activity:   . Days of Exercise per Week:   . Minutes of Exercise per Session:   Stress:   . Feeling of Stress :   Social Connections:   . Frequency of Communication with Friends and Family:   . Frequency of Social Gatherings with Friends and Family:   . Attends Religious Services:   . Active Member of Clubs or Organizations:   . Attends Archivist Meetings:   Marland Kitchen  Marital Status:     Review of Systems: Gen: Denies fever, chills, anorexia. Denies fatigue, weakness, weight loss.  CV: Denies chest pain, palpitations, syncope, peripheral edema, and claudication. Resp: Denies dyspnea at rest, cough, wheezing, coughing up blood, and pleurisy. GI: Denies vomiting blood, jaundice, and fecal incontinence.   Denies dysphagia or odynophagia. Derm: Denies rash, itching, dry skin Psych: Denies depression, anxiety, memory loss, confusion. No homicidal or suicidal ideation.  Heme: Denies bruising, bleeding, and enlarged lymph nodes.  Physical Exam: There were no vitals taken for this visit. General:   Alert and oriented. No distress noted. Pleasant and cooperative.  Head:  Normocephalic and atraumatic. Eyes:  Conjuctiva clear without scleral icterus. Mouth:  Oral mucosa pink and moist. Good dentition. No lesions. Heart:  S1, S2 present without murmurs appreciated. Lungs:  Clear to auscultation bilaterally. No wheezes, rales, or rhonchi. No distress.  Abdomen:  +BS, soft, non-tender and non-distended. No rebound or guarding. No HSM or masses noted. Msk:  Symmetrical without gross deformities. Normal posture. Extremities:  Without edema. Neurologic:  Alert and  oriented x4 Psych:  Alert and cooperative. Normal mood and affect.

## 2019-12-02 ENCOUNTER — Ambulatory Visit: Payer: Self-pay | Admitting: Gastroenterology

## 2020-01-25 ENCOUNTER — Ambulatory Visit: Payer: Self-pay | Admitting: Gastroenterology

## 2021-08-13 ENCOUNTER — Ambulatory Visit
Admission: RE | Admit: 2021-08-13 | Discharge: 2021-08-13 | Disposition: A | Discharge status: Home / Self Care | Payer: Self-pay | Source: Ambulatory Visit | Attending: Family Medicine | Admitting: Family Medicine

## 2021-08-13 ENCOUNTER — Ambulatory Visit (INDEPENDENT_AMBULATORY_CARE_PROVIDER_SITE_OTHER): Payer: Self-pay

## 2021-08-13 ENCOUNTER — Other Ambulatory Visit: Payer: Self-pay

## 2021-08-13 VITALS — BP 157/91 | HR 99 | Temp 99.5°F | Resp 18 | Ht 67.0 in | Wt 295.0 lb

## 2021-08-13 DIAGNOSIS — J4521 Mild intermittent asthma with (acute) exacerbation: Secondary | ICD-10-CM

## 2021-08-13 DIAGNOSIS — Z1152 Encounter for screening for COVID-19: Secondary | ICD-10-CM

## 2021-08-13 DIAGNOSIS — R059 Cough, unspecified: Secondary | ICD-10-CM

## 2021-08-13 DIAGNOSIS — J189 Pneumonia, unspecified organism: Secondary | ICD-10-CM

## 2021-08-13 DIAGNOSIS — R0789 Other chest pain: Secondary | ICD-10-CM

## 2021-08-13 DIAGNOSIS — R509 Fever, unspecified: Secondary | ICD-10-CM

## 2021-08-13 DIAGNOSIS — J069 Acute upper respiratory infection, unspecified: Secondary | ICD-10-CM

## 2021-08-13 MED ORDER — PROMETHAZINE-DM 6.25-15 MG/5ML PO SYRP: 5.0000 mL | ORAL_SOLUTION | Freq: Four times a day (QID) | ORAL | 0 refills | Status: DC | PRN

## 2021-08-13 MED ORDER — ALBUTEROL SULFATE HFA 108 (90 BASE) MCG/ACT IN AERS: 1.0000 | INHALATION_SPRAY | Freq: Four times a day (QID) | RESPIRATORY_TRACT | 0 refills | Status: DC | PRN

## 2021-08-13 MED ORDER — PREDNISONE 20 MG PO TABS: 40.0000 mg | ORAL_TABLET | Freq: Every day | ORAL | 0 refills | Status: DC

## 2021-08-13 MED ORDER — ALBUTEROL SULFATE (2.5 MG/3ML) 0.083% IN NEBU: 2.5000 mg | INHALATION_SOLUTION | Freq: Once | RESPIRATORY_TRACT | Status: DC

## 2021-08-13 MED ORDER — AMOXICILLIN-POT CLAVULANATE 875-125 MG PO TABS: 1.0000 | ORAL_TABLET | Freq: Two times a day (BID) | ORAL | 0 refills | Status: DC

## 2021-08-13 MED ORDER — AZITHROMYCIN 250 MG PO TABS: ORAL_TABLET | ORAL | 0 refills | Status: DC

## 2021-08-13 NOTE — ED Provider Notes (Signed)
[facility]    CSN: [id number] Arrival date & time: 08/13/21  1144      History   Chief Complaint Chief Complaint  Patient presents with   Cough   HPI [name] [name] is a 56 y.o. male.   Presenting today with 3-day history of progressively worsening hacking cough, rib cage soreness with coughing, fever, congestion, wheezing, chest tightness.  Denies chest pain, shortness of breath, abdominal pain, nausea vomiting or diarrhea.  History of asthma, states does not currently have any inhalers at home and has been using an expired 1 with some relief.  Also taking over-the-counter cold and congestion medication with minimal relief.  No known sick contacts recently.  Past Medical History:  Diagnosis Date   Arthritis    Asthma    as child   Back pain, chronic 02/18/11   BULGING DISK   Complication of anesthesia    "woke up during spleenectomy at [facility]".   Diabetes mellitus without complication (HCC)    GERD (gastroesophageal reflux disease)    erosive reflux esophagitis, small HH   Hemorrhoids    History of kidney stones    Hyperlipemia    Hypertension    Hypothyroidism    IBS (irritable bowel syndrome)    Low serum cortisol level    Dr Suzette Battiest   Lower extremity edema    Nephrolithiasis 12/19/2012   Plantar fasciitis    Restless leg syndrome    S/P colonoscopy 08/2008   int/ext hemorrhoids. friable, rectal polyp   S/P endoscopy 01/10/09   Dr Vivi Ferns, small [facility]   Sleep apnea    cpap   Spinal stenosis of lumbar region 12/18/2012   Thyroid disease    Tubular adenoma of colon 2000   UTI (lower urinary tract infection)    Patient Active Problem List   Diagnosis Date Noted   Uncontrolled type 2 diabetes mellitus with hyperglycemia ([place]) 10/26/2019   Hyperglycemia due to diabetes mellitus ([place]) 10/25/2019   Generalized weakness 10/25/2019   Urinary tract infection 10/24/2019   Rectal bleeding 02/07/2017   Abdominal hernia 02/07/2017   FHx: colon cancer  02/07/2017   Hemorrhoids, complicated 08/65/7846   Nephrolithiasis 12/19/2012   Spinal stenosis of lumbar region 12/18/2012   RBBB 12/17/2012   Hypothyroidism 12/17/2012   Adrenal insufficiency ([place]) 12/17/2012   Hyperglycemia, drug-induced 12/17/2012   Hypokalemia 12/16/2012   Traumatic rupture of distal biceps tendon 02/20/2012   Depression 08/22/2011   Adenomatous polyps 06/29/2011   Abnormal CT scan, small bowel 03/05/2011   Fatty liver 03/05/2011   Pyelonephritis 02/19/2011   Status post splenectomy 02/19/2011   History of ITP 02/19/2011   OSA on CPAP 02/19/2011   GLOBUS HYSTERICUS 01/05/2009   Irritable bowel syndrome 01/05/2009   DYSPHAGIA UNSPECIFIED 01/05/2009   GERD 01/04/2009   HIATAL HERNIA 01/04/2009   HEMATOCHEZIA 01/04/2009   HEMOPTYSIS 01/04/2009   ABDOMINAL PAIN 01/04/2009   Past Surgical History:  Procedure Laterality Date   CHOLECYSTECTOMY  2006   COLONOSCOPY  2000   Dr. Gala Romney- tubular adenoma   COLONOSCOPY  08/10/2008   Dr. Gala Romney-  IMPRESSION:  Prominent external anal canal hemorrhoids, hyperplastic polyp   COLONOSCOPY N/A 10/08/2013   [id number] colonic polyps -  removed as described above/Prominent grade 3 hemorrhoids -  likely source of hematochezia. tubular adenomas. next TCS 10/2016   COLONOSCOPY WITH PROPOFOL N/A 02/25/2017   Procedure: COLONOSCOPY WITH PROPOFOL;  Surgeon: Daneil Dolin, MD;  Grade 4 internal hemorrhoids, 1 tubular adenoma, recommendations to repeat  in 5 years.    CYSTOSCOPY     with utetheral dilation   DISTAL BICEPS TENDON REPAIR  02/20/2012   Procedure: DISTAL BICEPS TENDON REPAIR;  Surgeon: Lorn Junes, MD;  Location: [place];  Service: Orthopedics;  Laterality: Left;  left elbow distal biceps tendon repair   ESOPHAGOGASTRODUODENOSCOPY  2010   Dr. Gala Romney: normal   FLEXIBLE SIGMOIDOSCOPY N/A 11/10/2013   Dr.Rourk- internal/external hemorrhoids. external hemorrhoids grade 3 with some residual prolapsing tissue.  normal rectum aside form hemorrhoids. s/p band placement.   HEMORRHOID BANDING N/A 11/10/2013   Procedure: HEMORRHOID BANDING;  Surgeon: Daneil Dolin, MD;  [facility];  Service: Endoscopy;  Laterality: N/A;   HERNIA REPAIR     INCISIONAL HERNIA REPAIR N/A 04/10/2017   Procedure: Fatima Blank HERNIORRHAPHY WITH MESH;  Surgeon: Aviva Signs, MD;  Location: [facility];  Service: General;  Laterality: N/A;   KNEE SURGERY     left   POLYPECTOMY  02/25/2017   Procedure: POLYPECTOMY - ASCENDING COLON;  Surgeon: Daneil Dolin, MD;  [facility];  Service: Endoscopy;;   SPLENECTOMY  2003   itp       Home Medications    Prior to Admission medications   Medication Sig Start Date End Date Taking? Authorizing Provider  albuterol (VENTOLIN HFA) 108 (90 Base) MCG/ACT inhaler Inhale 1-2 puffs into the lungs every 6 (six) hours as needed for wheezing or shortness of breath. 08/13/21  Yes Volney American, PA-C  amoxicillin-clavulanate (AUGMENTIN) 875-125 MG tablet Take 1 tablet by mouth every 12 (twelve) hours. 08/13/21  Yes Volney American, PA-C  azithromycin (ZITHROMAX) 250 MG tablet Take first 2 tablets together, then 1 every day until finished. 08/13/21  Yes Volney American, PA-C  predniSONE (DELTASONE) 20 MG tablet Take 2 tablets (40 mg total) by mouth daily with breakfast. 08/13/21  Yes Volney American, PA-C  promethazine-dextromethorphan (PROMETHAZINE-DM) 6.25-15 MG/5ML syrup Take 5 mLs by mouth 4 (four) times daily as needed. 08/13/21  Yes Volney American, PA-C  amitriptyline (ELAVIL) 10 MG tablet Take 10 mg by mouth at bedtime.     [provider]  amoxicillin (AMOXIL) 500 MG capsule Take 1 capsule (500 mg total) by mouth every 8 (eight) hours. 10/27/19   Orson Eva, MD  aspirin EC 81 MG tablet Take 81 mg by mouth every evening.     [provider]  Cholecalciferol (VITAMIN D3) 125 MCG (5000 UT) CAPS Take 1 capsule by mouth 2 (two)  times a week.    [provider]  DULoxetine (CYMBALTA) 60 MG capsule Take 120 mg by mouth at bedtime.     [provider]  glimepiride (AMARYL) 2 MG tablet Take 2 mg by mouth 2 (two) times daily. 09/09/19   [provider]  hydrocortisone (CORTEF) 20 MG tablet Take 0.5 tablets (10 mg total) by mouth daily. For cortisol replacement Patient taking differently: Take 40 mg by mouth daily. For cortisol replacement 08/23/11   Darrol Jump, MD  levothyroxine (SYNTHROID) 137 MCG tablet Take 137 mcg by mouth every morning. 09/30/19   [provider]  losartan (COZAAR) 100 MG tablet Take 100 mg by mouth daily. 04/01/17   [provider]  metFORMIN (GLUCOPHAGE) 1000 MG tablet Take 1,000 mg by mouth 2 (two) times daily. 10/21/17   [provider]  Multiple Vitamins-Minerals (MENS MULTIVITAMIN PLUS) TABS Take 1 tablet by mouth daily. /For nutritional supplementation. 08/23/11   Rudean Curt  O, MD  Omega-3 Fatty Acids (OMEGA-3 FISH OIL PO) Take 1 capsule by mouth at bedtime.     [provider]  omeprazole (PRILOSEC) 40 MG capsule Take 1 capsule (40 mg total) by mouth daily. Patient taking differently: Take 40 mg by mouth in the morning.  07/16/14   Annitta Needs, NP  simvastatin (ZOCOR) 40 MG tablet Take 40 mg by mouth at bedtime.  11/08/17   [provider]  sitaGLIPtin (JANUVIA) 100 MG tablet Take 100 mg by mouth at bedtime.     [provider]  tamsulosin (FLOMAX) 0.4 MG CAPS capsule Take 0.4 mg by mouth in the morning.     [provider]    Family History Family History  Problem Relation Age of Onset   Colon cancer Father 15       deceased 6 months after diagnosis   Diabetes Brother     Social History Social History   Tobacco Use   Smoking status: Former    Packs/day: 0.50    Years: 30.00    Pack years: 15.00    Types: Cigarettes    Quit date: 03/05/2007    Years since quitting: 14.4   Smokeless tobacco:  Never  Vaping Use   Vaping Use: Never used  Substance Use Topics   Alcohol use: No    Comment: 2008   Drug use: No    Comment: 2008     Allergies   Patient has no known allergies.   Review of Systems Review of Systems Per HPI  Physical Exam Triage Vital Signs ED Triage Vitals  Enc Vitals Group     BP 08/13/21 1236 (!) 157/91     Pulse Rate 08/13/21 1236 99     Resp 08/13/21 1236 18     Temp 08/13/21 1236 99.5 F (37.5 C)     Temp Source 08/13/21 1236 Oral     SpO2 08/13/21 1236 93 %     Weight 08/13/21 1237 295 lb (133.8 kg)     Height 08/13/21 1237 '5\' 7"'$  (1.702 m)     Head Circumference --      Peak Flow --      Pain Score 08/13/21 1237 8     Pain Loc --      Pain Edu? --      Excl. in [place]? --    No data found.  Updated Vital Signs BP (!) 157/91 (BP Location: Right Arm)    Pulse 99    Temp 99.5 F (37.5 C) (Oral)    Resp 18    Ht '5\' 7"'$  (1.702 m)    Wt 295 lb (133.8 kg)    SpO2 93%    BMI 46.20 kg/m   Visual Acuity Right Eye Distance:   Left Eye Distance:   Bilateral Distance:    Right Eye Near:   Left Eye Near:    Bilateral Near:     Physical Exam Vitals and nursing note reviewed.  Constitutional:      Appearance: He is well-developed.  HENT:     Head: Atraumatic.     Right Ear: External ear normal.     Left Ear: External ear normal.     Nose: Rhinorrhea present.     Mouth/Throat:     Mouth: Mucous membranes are moist.     Pharynx: Oropharynx is clear. Posterior oropharyngeal erythema present. No oropharyngeal exudate.  Eyes:     Conjunctiva/sclera: Conjunctivae normal.     Pupils: Pupils are  equal, round, and reactive to light.  Cardiovascular:     Rate and Rhythm: Normal rate and regular rhythm.     Heart sounds: Normal heart sounds.  Pulmonary:     Effort: Pulmonary effort is normal. No respiratory distress.     Breath sounds: Wheezing and rales present.     Comments: Crackles left upper lung field noted on auscultation post nebulizer  treatment.  Diffuse wheezes Musculoskeletal:        General: Normal range of motion.     Cervical back: Normal range of motion and neck supple.  Lymphadenopathy:     Cervical: No cervical adenopathy.  Skin:    General: Skin is warm and dry.  Neurological:     Mental Status: He is alert and oriented to person, place, and time.     Motor: No weakness.     Gait: Gait normal.  Psychiatric:        Mood and Affect: Mood normal.        Behavior: Behavior normal.        Thought Content: Thought content normal.   UC Treatments / Results  Labs (all labs ordered are listed, but only abnormal results are displayed) Labs Reviewed  COVID-19, FLU A+B NAA   EKG  Radiology DG Chest 2 View  Result Date: 08/13/2021 CLINICAL DATA:  Cough, fever, and pleuritic chest pain for several days. EXAM: CHEST - 2 VIEW COMPARISON:  10/25/2019 FINDINGS: The heart size and mediastinal contours are within normal limits. Stable elevation of right hemidiaphragm. Ill-defined area of airspace opacity is seen in the left upper lobe which is new since previous study, and suspicious for pneumonia. No evidence of pleural effusion. The visualized skeletal structures are unremarkable. IMPRESSION: New left upper lobe airspace disease, suspicious for pneumonia. Recommend continued chest radiographic follow-up to confirm resolution. Electronically Signed   By: Marlaine Hind M.D.   On: 08/13/2021 12:57    Procedures Procedures (including critical care time)  Medications Ordered in UC Medications  albuterol (PROVENTIL) (2.5 MG/3ML) 0.083% nebulizer solution 2.5 mg (has no administration in time range)   Initial Impression / Assessment and Plan / UC Course  I have reviewed the triage vital signs and the nursing notes.  Pertinent labs & imaging results that were available during my care of the patient were reviewed by me and considered in my medical decision making (see chart for details).     X-ray positive for left upper  lobe pneumonia, likely secondary to viral upper respiratory infection and asthma exacerbation.  We will treat with Augmentin, azithromycin, prednisone, Phenergan DM, albuterol inhaler and continue over-the-counter supportive medications and home care.  Nebulizer treatment administered while in clinic with moderate relief.  Return for any acutely worsening symptoms.  Final Clinical Impressions(s) / UC Diagnoses   Final diagnoses:  Encounter for screening for COVID-19  Pneumonia of left upper lobe due to infectious organism  Mild intermittent asthma with acute exacerbation  Viral URI with cough   Discharge Instructions   None    ED Prescriptions     Medication Sig Dispense Auth. Provider   amoxicillin-clavulanate (AUGMENTIN) 875-125 MG tablet Take 1 tablet by mouth every 12 (twelve) hours. 14 tablet Volney American, [place]   azithromycin (ZITHROMAX) 250 MG tablet Take first 2 tablets together, then 1 every day until finished. 6 tablet Volney American, [place]   predniSONE (DELTASONE) 20 MG tablet Take 2 tablets (40 mg total) by mouth daily with breakfast. 10 tablet Volney American,  PA-C   promethazine-dextromethorphan (PROMETHAZINE-DM) 6.25-15 MG/5ML syrup Take 5 mLs by mouth 4 (four) times daily as needed. 100 mL Volney American, PA-C   albuterol (VENTOLIN HFA) 108 (90 Base) MCG/ACT inhaler Inhale 1-2 puffs into the lungs every 6 (six) hours as needed for wheezing or shortness of breath. 18 g Volney American, [place]      PDMP not reviewed this encounter.   Volney American, [place] 08/13/21 1335

## 2021-08-13 NOTE — ED Triage Notes (Addendum)
Pt reports intermittent cough, bilateral rib cage pain, abdominal pain, fever since Thursday night ?

## 2021-08-14 LAB — COVID-19, FLU A+B NAA
Influenza A, NAA: NOT DETECTED
Influenza B, NAA: NOT DETECTED
SARS-CoV-2, NAA: NOT DETECTED

## 2021-08-15 ENCOUNTER — Telehealth: Payer: Self-pay | Admitting: Urgent Care

## 2021-08-15 MED ORDER — ALBUTEROL SULFATE HFA 108 (90 BASE) MCG/ACT IN AERS: 1.0000 | INHALATION_SPRAY | Freq: Four times a day (QID) | RESPIRATORY_TRACT | 0 refills | Status: DC | PRN

## 2021-08-15 NOTE — Telephone Encounter (Signed)
Patient requested a different form of albuterol be sent to the pharmacy as the one he was prescribed over the weekend needed prior authorization.  We will send a prescription for ProAir to the pharmacy now. ?

## 2021-08-16 DIAGNOSIS — E118 Type 2 diabetes mellitus with unspecified complications: Secondary | ICD-10-CM | POA: Diagnosis not present

## 2021-08-16 DIAGNOSIS — Z6841 Body Mass Index (BMI) 40.0 and over, adult: Secondary | ICD-10-CM | POA: Diagnosis not present

## 2021-08-16 DIAGNOSIS — J189 Pneumonia, unspecified organism: Secondary | ICD-10-CM | POA: Diagnosis not present

## 2021-08-16 DIAGNOSIS — I10 Essential (primary) hypertension: Secondary | ICD-10-CM | POA: Diagnosis not present

## 2021-08-16 DIAGNOSIS — E039 Hypothyroidism, unspecified: Secondary | ICD-10-CM | POA: Diagnosis not present

## 2021-09-06 ENCOUNTER — Encounter (HOSPITAL_COMMUNITY): Payer: Self-pay | Admitting: Radiology

## 2021-11-20 ENCOUNTER — Other Ambulatory Visit: Payer: Self-pay | Admitting: Family Medicine

## 2021-11-20 ENCOUNTER — Other Ambulatory Visit (HOSPITAL_COMMUNITY): Payer: Self-pay | Admitting: Family Medicine

## 2021-11-20 DIAGNOSIS — E039 Hypothyroidism, unspecified: Secondary | ICD-10-CM | POA: Diagnosis not present

## 2021-11-20 DIAGNOSIS — E782 Mixed hyperlipidemia: Secondary | ICD-10-CM | POA: Diagnosis not present

## 2021-11-20 DIAGNOSIS — R109 Unspecified abdominal pain: Secondary | ICD-10-CM | POA: Diagnosis not present

## 2021-11-20 DIAGNOSIS — E559 Vitamin D deficiency, unspecified: Secondary | ICD-10-CM | POA: Diagnosis not present

## 2021-11-20 DIAGNOSIS — I10 Essential (primary) hypertension: Secondary | ICD-10-CM | POA: Diagnosis not present

## 2021-11-20 DIAGNOSIS — Z6841 Body Mass Index (BMI) 40.0 and over, adult: Secondary | ICD-10-CM | POA: Diagnosis not present

## 2021-11-20 DIAGNOSIS — E1165 Type 2 diabetes mellitus with hyperglycemia: Secondary | ICD-10-CM | POA: Diagnosis not present

## 2021-11-28 ENCOUNTER — Ambulatory Visit (INDEPENDENT_AMBULATORY_CARE_PROVIDER_SITE_OTHER): Payer: BC Managed Care – PPO | Admitting: Internal Medicine

## 2021-11-28 ENCOUNTER — Encounter: Payer: Self-pay | Admitting: Internal Medicine

## 2021-11-28 VITALS — BP 139/91 | HR 92 | Temp 98.3°F | Ht 67.0 in | Wt 294.6 lb

## 2021-11-28 DIAGNOSIS — R197 Diarrhea, unspecified: Secondary | ICD-10-CM

## 2021-11-28 NOTE — Progress Notes (Signed)
Primary Care Physician:  Assunta Found, MD Primary Gastroenterologist:  Dr. Jena Gauss  Pre-Procedure History & Physical: HPI:  [name] [name] is a 56 y.o. male here for evaluation of a 11-month history of vague abdominal cramps,  pressure and diarrhea.  Patient states he was doing well this year with well-controlled GERD until he developed acute illness characterized by respiratory symptoms in March.  Saw urgent care.  Was found to have a left upper lobe infiltrate treated for pneumonia with Augmentin and Zithromax.  Symptoms gradually improved.  In April, he started having vague abdominal cramps pressure multiple episodes of watery diarrhea punctuated with episodes of blood per rectum (known grade 4 hemorrhoids).  Stooling day and night particular  - multiple episodes at night.  Saw Dr. Phillips Odor recently did blood work and has ordered a abdominal CT scheduled for 7/24. He has had a couple rare episodes of nausea and is actually vomited.  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  Status post splenectomy for ITP recent labs indicated a calcium slightly elevated 10.4 albumin 4.5.  CBC white count 10.9 TSH 5.09 for which she has thyroxine was increased.  He also has a reported history of adrenal insufficiency. Previous EGD normal.  Small adenoma removed from his colon 2019; due for colonoscopy this year. History of symptomatic hemorrhoids (primarily bleeding) in the past.  Partial response to hemorrhoid banding here.  Saw Dr. Lovell Sheehan.  Felt he had major hemorrhoids felt hemorrhoidectomy would be a major endeavor.  Patient has gotten along just fine with his hemorrhoids until his recent GI illness which developed in April of this year.   Past Medical History:  Diagnosis Date   Arthritis    Asthma    as child   Back pain, chronic 02/18/11   BULGING DISK   Complication of anesthesia    "woke up during spleenectomy at [facility]".   Diabetes mellitus without complication (HCC)     GERD (gastroesophageal reflux disease)    erosive reflux esophagitis, small HH   Hemorrhoids    History of kidney stones    Hyperlipemia    Hypertension    Hypothyroidism    IBS (irritable bowel syndrome)    Low serum cortisol level    Dr Horald Pollen   Lower extremity edema    Nephrolithiasis 12/19/2012   Plantar fasciitis    Restless leg syndrome    S/P colonoscopy 08/2008   int/ext hemorrhoids. friable, rectal polyp   S/P endoscopy 01/10/09   Dr Elmer Ramp, small [facility]   Sleep apnea    cpap   Spinal stenosis of lumbar region 12/18/2012   Thyroid disease    Tubular adenoma of colon 2000   UTI (lower urinary tract infection)     Past Surgical History:  Procedure Laterality Date   CHOLECYSTECTOMY  2006   COLONOSCOPY  2000   Dr. Jena Gauss- tubular adenoma   COLONOSCOPY  08/10/2008   Dr. Jena Gauss-  IMPRESSION:  Prominent external anal canal hemorrhoids, hyperplastic polyp   COLONOSCOPY N/A 10/08/2013   [id number] colonic polyps -  removed as described above/Prominent grade 3 hemorrhoids -  likely source of hematochezia. tubular adenomas. next TCS 10/2016   COLONOSCOPY WITH PROPOFOL N/A 02/25/2017   Procedure: COLONOSCOPY WITH PROPOFOL;  Surgeon: Corbin Ade, MD;  Grade 4 internal hemorrhoids, 1 tubular adenoma, recommendations to repeat in 5 years.    CYSTOSCOPY     with utetheral dilation   DISTAL BICEPS TENDON REPAIR  02/20/2012   Procedure: DISTAL BICEPS TENDON REPAIR;  Surgeon: Nilda Simmer, MD;  Location: [facility];  Service: Orthopedics;  Laterality: Left;  left elbow distal biceps tendon repair   ESOPHAGOGASTRODUODENOSCOPY  2010   Dr. Jena Gauss: normal   FLEXIBLE SIGMOIDOSCOPY N/A 11/10/2013   Dr.Kaya Pottenger- internal/external hemorrhoids. external hemorrhoids grade 3 with some residual prolapsing tissue. normal rectum aside form hemorrhoids. s/p band placement.   HEMORRHOID BANDING N/A 11/10/2013   Procedure: HEMORRHOID BANDING;  Surgeon: Corbin Ade, MD;  [facility];  Service: Endoscopy;  Laterality: N/A;   HERNIA REPAIR     INCISIONAL HERNIA REPAIR N/A 04/10/2017   Procedure: Sherald Hess HERNIORRHAPHY WITH MESH;  Surgeon: Franky Macho, MD;  Location: [facility];  Service: General;  Laterality: N/A;   KNEE SURGERY     left   POLYPECTOMY  02/25/2017   Procedure: POLYPECTOMY - ASCENDING COLON;  Surgeon: Corbin Ade, MD;  Location: [facility];  Service: Endoscopy;;   SPLENECTOMY  2003   itp    Prior to Admission medications   Medication Sig Start Date End Date Taking? Authorizing Provider  amLODipine (NORVASC) 5 MG tablet Take 5 mg by mouth daily. 11/17/21  Yes [provider]  aspirin EC 81 MG tablet Take 81 mg by mouth every evening.    Yes [provider]  Cholecalciferol (VITAMIN D3) 125 MCG (5000 UT) CAPS Take 1 capsule by mouth 2 (two) times a week.   Yes [provider]  DULoxetine (CYMBALTA) 60 MG capsule Take 120 mg by mouth at bedtime.    Yes [provider]  glimepiride (AMARYL) 2 MG tablet Take 2 mg by mouth 2 (two) times daily. 09/09/19  Yes [provider]  hydrocortisone (CORTEF) 20 MG tablet Take 0.5 tablets (10 mg total) by mouth daily. For cortisol replacement Patient taking differently: Take 40 mg by mouth daily. For cortisol replacement 08/23/11  Yes Mike Craze, MD  levothyroxine (SYNTHROID) 150 MCG tablet Take 150 mcg by mouth every morning. 09/30/19  Yes [provider]  losartan (COZAAR) 100 MG tablet Take 100 mg by mouth daily. 04/01/17  Yes [provider]  metFORMIN (GLUCOPHAGE) 1000 MG tablet Take 1,000 mg by mouth 2 (two) times daily. 10/21/17  Yes [provider]  Multiple Vitamins-Minerals (MENS MULTIVITAMIN PLUS) TABS Take 1 tablet by mouth daily. /For nutritional supplementation. 08/23/11  Yes Mike Craze, MD  Omega-3 Fatty Acids (OMEGA-3 FISH OIL PO) Take 1 capsule by mouth at bedtime.    Yes [provider]  omeprazole (PRILOSEC) 40  MG capsule Take 1 capsule (40 mg total) by mouth daily. Patient taking differently: Take 40 mg by mouth in the morning. 07/16/14  Yes Gelene Mink, NP  simvastatin (ZOCOR) 40 MG tablet Take 40 mg by mouth at bedtime.  11/08/17  Yes [provider]  sitaGLIPtin (JANUVIA) 100 MG tablet Take 100 mg by mouth at bedtime.    Yes [provider]  tamsulosin (FLOMAX) 0.4 MG CAPS capsule Take 0.4 mg by mouth in the morning.    Yes [provider]    Allergies as of 11/28/2021   (No Known Allergies)    Family History  Problem Relation Age of Onset   Colon cancer Father 59       deceased 6 months after diagnosis   Diabetes Brother     Social History   Socioeconomic History   Marital status: Married    Spouse name:  Not on file   Number of children: 1   Years of education: Not on file   Highest education level: Not on file  Occupational History   Occupation: truck driver-local     Employer: [employer]  Tobacco Use   Smoking status: Former    Packs/day: 0.50    Years: 30.00    Total pack years: 15.00    Types: Cigarettes    Quit date: 03/05/2007    Years since quitting: 14.7   Smokeless tobacco: Never  Vaping Use   Vaping Use: Never used  Substance and Sexual Activity   Alcohol use: No    Comment: 2008   Drug use: No    Comment: 2008   Sexual activity: Yes  Other Topics Concern   Not on file  Social History Narrative   1 healthy son   Social Determinants of Health   Financial Resource Strain: Not on file  Food Insecurity: Not on file  Transportation Needs: Not on file  Physical Activity: Not on file  Stress: Not on file  Social Connections: Not on file  Intimate Partner Violence: Not on file    Review of Systems: See HPI, otherwise negative ROS  Physical Exam: BP (!) 139/91 (BP Location: Right Arm, Patient Position: Sitting, Cuff Size: Large)   Pulse 92   Temp 98.3 F (36.8 C) (Oral)   Ht 5\' 7"  (1.702 m)   Wt 294 lb 9.6 oz (133.6 kg)    SpO2 96%   BMI 46.14 kg/m  General:   Alert, obese gentleman alert conversant accompanied by his wife  Mouth:  No deformity or lesions. Neck:  Supple; no masses or thyromegaly. No significant cervical adenopathy. Lungs:  Clear throughout to auscultation.   No wheezes, crackles, or rhonchi. No acute distress. Heart:  Regular rate and rhythm; no murmurs, clicks, rubs,  or gallops. Abdomen: Obese.  Positive bowel sounds.  Mild diffuse tenderness.  No obvious mass organomegaly  pulses:  Normal pulses noted. Extremities:  Without clubbing or edema.  Impression/Plan: 55 year old gentleman treated for pneumonia in March (left upper lobe infiltrate).  Treated with multiple antibiotics.  A month later,  started having the above-mentioned GI illness.  In this setting, C. difficile colitis is high on the differential list and needs to be ruled out prior to entertaining alternative diagnoses.  Left upper lobe infiltrate as described above-he has a smoking history.  At a minimum, he needs a follow-up chest x-ray.  GERD well-controlled on omeprazole  History of colonic adenoma; due for colonoscopy this year  History of grade 4 hemorrhoids-quiescent until his recent acute GI illness.  Recommendations:  We need to get C. Difficile, GIP studies on a stool sample before we do anything else.  If the above is positive, we will treat accordingly  If it is negative, we will pursue an urgent CT scan this week.  You need to have a repeat chest x-ray at some point in the next few weeks to make sure the area in your lung has gone away  You do need a colonoscopy.  This will be done but the above studies need to be done first.  For now, continue your omeprazole daily for GERD  Further recommendations to follow.       Notice: This dictation was prepared with Dragon dictation along with smaller phrase technology. Any transcriptional errors that result from this process are unintentional and may not  be corrected upon review.

## 2021-11-30 DIAGNOSIS — R197 Diarrhea, unspecified: Secondary | ICD-10-CM | POA: Diagnosis not present

## 2021-12-03 LAB — GI PROFILE, STOOL, PCR

## 2021-12-04 LAB — C DIFFICILE, CYTOTOXIN B

## 2021-12-04 LAB — C DIFFICILE TOXINS A+B W/RFLX: C difficile Toxins A+B, EIA: NEGATIVE

## 2021-12-08 ENCOUNTER — Ambulatory Visit (HOSPITAL_COMMUNITY)
Admission: RE | Admit: 2021-12-08 | Discharge: 2021-12-08 | Disposition: A | Discharge status: Home / Self Care | Payer: BC Managed Care – PPO | Source: Ambulatory Visit | Attending: Family Medicine | Admitting: Family Medicine

## 2021-12-08 DIAGNOSIS — K3189 Other diseases of stomach and duodenum: Secondary | ICD-10-CM | POA: Diagnosis not present

## 2021-12-08 DIAGNOSIS — N200 Calculus of kidney: Secondary | ICD-10-CM | POA: Diagnosis not present

## 2021-12-08 DIAGNOSIS — K760 Fatty (change of) liver, not elsewhere classified: Secondary | ICD-10-CM | POA: Diagnosis not present

## 2021-12-08 DIAGNOSIS — R109 Unspecified abdominal pain: Secondary | ICD-10-CM | POA: Diagnosis not present

## 2021-12-08 MED ORDER — IOHEXOL 300 MG/ML  SOLN
100.0000 mL | Freq: Once | INTRAMUSCULAR | Status: AC | PRN
  Administered 2021-12-08: 100 mL via INTRAVENOUS

## 2021-12-12 ENCOUNTER — Encounter: Payer: Self-pay | Admitting: Internal Medicine

## 2021-12-12 DIAGNOSIS — R918 Other nonspecific abnormal finding of lung field: Secondary | ICD-10-CM

## 2021-12-12 DIAGNOSIS — R079 Chest pain, unspecified: Secondary | ICD-10-CM

## 2021-12-12 LAB — POCT I-STAT CREATININE: Creatinine, Ser: 0.6 mg/dL — ABNORMAL LOW (ref 0.61–1.24)

## 2021-12-12 NOTE — Telephone Encounter (Signed)
Called patient.  Reviewed negative stool studies.  CT scan shows a thickened stomach of uncertain significance.  Needs an EGD.  Also ,needs a colonoscopy because of family history of colon cancer and personal history colon polyps.  Patient states that he notes his diarrhea has gotten better.  Still has abdominal discomfort.  He also relates to me that he has been having some chest pain from the top of his neck and some ear pain all the way down to his "appendix".  Worse at night.  States typical heartburn symptoms are abolished with omeprazole daily.  Multiple risk factors for CAD including hyperlipidemia, hypertension, diabetes and family history.  No recent cardiac evaluation.  Also, left upper lobe infiltrate to be surveilled in the near future.  My recommendations are as follows #1 needs EGD and colonoscopy but needs cardiac evaluation first  He asked me to facilitate cardiac consultation.  Lets send over to Dr. Domenic Polite for chest pain of uncertain etiology for consideration of stress test, etc.  Also, lets go ahead and repeat a PA/lateral chest x-ray to follow-up on left upper lobe infiltrate seen 4 months ago.  Once cardiac evaluation has been completed, we can circle back and plan for an EGD and colonoscopy.

## 2021-12-13 ENCOUNTER — Ambulatory Visit (HOSPITAL_COMMUNITY)
Admission: RE | Admit: 2021-12-13 | Discharge: 2021-12-13 | Disposition: A | Discharge status: Home / Self Care | Payer: BC Managed Care – PPO | Source: Ambulatory Visit | Attending: Internal Medicine | Admitting: Internal Medicine

## 2021-12-13 DIAGNOSIS — R918 Other nonspecific abnormal finding of lung field: Secondary | ICD-10-CM | POA: Diagnosis not present

## 2021-12-13 DIAGNOSIS — J90 Pleural effusion, not elsewhere classified: Secondary | ICD-10-CM | POA: Diagnosis not present

## 2021-12-13 NOTE — Telephone Encounter (Signed)
Communication noted.  

## 2021-12-14 ENCOUNTER — Telehealth: Payer: Self-pay | Admitting: Internal Medicine

## 2021-12-14 NOTE — Telephone Encounter (Signed)
Pt LMOM that he wanted to talk with someone about him being referred out by [facility]> [phone]

## 2021-12-14 NOTE — Telephone Encounter (Signed)
How long are you approving for him to be out of work for?

## 2021-12-15 ENCOUNTER — Telehealth: Payer: Self-pay | Admitting: Internal Medicine

## 2021-12-15 NOTE — Telephone Encounter (Signed)
Called pt, no answer, VM full.

## 2021-12-15 NOTE — Telephone Encounter (Signed)
Returning call. [phone]

## 2021-12-18 ENCOUNTER — Telehealth: Payer: Self-pay | Admitting: Internal Medicine

## 2021-12-18 NOTE — Telephone Encounter (Signed)
Called pt no answer and no VM

## 2021-12-18 NOTE — Telephone Encounter (Signed)
PATIENT CALLED AND SAID THAT WE REFERRED HIM TO [facility] AND THEY CAN NOT GET HIM IN [place]

## 2021-12-19 NOTE — Telephone Encounter (Signed)
Routing to [place] to follow up on cardiology referral to see what can be done.

## 2021-12-19 NOTE — Telephone Encounter (Signed)
Any advice?

## 2021-12-19 NOTE — Telephone Encounter (Signed)
Pt scheduled for 9/6 with Dr. Domenic Polite

## 2021-12-20 NOTE — Telephone Encounter (Signed)
Will call pt once we received that schedule

## 2021-12-25 ENCOUNTER — Other Ambulatory Visit (HOSPITAL_COMMUNITY): Payer: Self-pay

## 2021-12-27 ENCOUNTER — Telehealth: Payer: Self-pay | Admitting: "Endocrinology

## 2021-12-27 NOTE — Telephone Encounter (Signed)
Closed referral from [facility] due to patient stating he already has an endocrinology provider that he sees

## 2022-01-03 ENCOUNTER — Emergency Department (HOSPITAL_COMMUNITY): Payer: BC Managed Care – PPO

## 2022-01-03 ENCOUNTER — Observation Stay (HOSPITAL_COMMUNITY)
Admission: EM | Admit: 2022-01-03 | Discharge: 2022-01-05 | Disposition: A | Discharge status: Still In Hospital | Payer: BC Managed Care – PPO | Attending: Internal Medicine | Admitting: Internal Medicine

## 2022-01-03 ENCOUNTER — Encounter (HOSPITAL_COMMUNITY): Payer: Self-pay

## 2022-01-03 ENCOUNTER — Other Ambulatory Visit: Payer: Self-pay

## 2022-01-03 DIAGNOSIS — Z85038 Personal history of other malignant neoplasm of large intestine: Secondary | ICD-10-CM | POA: Insufficient documentation

## 2022-01-03 DIAGNOSIS — E1159 Type 2 diabetes mellitus with other circulatory complications: Secondary | ICD-10-CM | POA: Diagnosis not present

## 2022-01-03 DIAGNOSIS — E876 Hypokalemia: Secondary | ICD-10-CM | POA: Insufficient documentation

## 2022-01-03 DIAGNOSIS — R072 Precordial pain: Secondary | ICD-10-CM

## 2022-01-03 DIAGNOSIS — Z7984 Long term (current) use of oral hypoglycemic drugs: Secondary | ICD-10-CM | POA: Diagnosis not present

## 2022-01-03 DIAGNOSIS — E2740 Unspecified adrenocortical insufficiency: Secondary | ICD-10-CM | POA: Diagnosis not present

## 2022-01-03 DIAGNOSIS — Z9989 Dependence on other enabling machines and devices: Secondary | ICD-10-CM

## 2022-01-03 DIAGNOSIS — E782 Mixed hyperlipidemia: Secondary | ICD-10-CM

## 2022-01-03 DIAGNOSIS — J45909 Unspecified asthma, uncomplicated: Secondary | ICD-10-CM | POA: Insufficient documentation

## 2022-01-03 DIAGNOSIS — Z7982 Long term (current) use of aspirin: Secondary | ICD-10-CM | POA: Insufficient documentation

## 2022-01-03 DIAGNOSIS — R0602 Shortness of breath: Secondary | ICD-10-CM | POA: Insufficient documentation

## 2022-01-03 DIAGNOSIS — E039 Hypothyroidism, unspecified: Secondary | ICD-10-CM | POA: Insufficient documentation

## 2022-01-03 DIAGNOSIS — R0789 Other chest pain: Secondary | ICD-10-CM | POA: Diagnosis not present

## 2022-01-03 DIAGNOSIS — E1165 Type 2 diabetes mellitus with hyperglycemia: Secondary | ICD-10-CM | POA: Insufficient documentation

## 2022-01-03 DIAGNOSIS — R079 Chest pain, unspecified: Secondary | ICD-10-CM | POA: Diagnosis present

## 2022-01-03 DIAGNOSIS — Z87891 Personal history of nicotine dependence: Secondary | ICD-10-CM | POA: Insufficient documentation

## 2022-01-03 DIAGNOSIS — R000 Tachycardia, unspecified: Secondary | ICD-10-CM | POA: Diagnosis not present

## 2022-01-03 DIAGNOSIS — Z79899 Other long term (current) drug therapy: Secondary | ICD-10-CM | POA: Insufficient documentation

## 2022-01-03 DIAGNOSIS — Z20822 Contact with and (suspected) exposure to covid-19: Secondary | ICD-10-CM | POA: Diagnosis not present

## 2022-01-03 DIAGNOSIS — G4733 Obstructive sleep apnea (adult) (pediatric): Secondary | ICD-10-CM

## 2022-01-03 DIAGNOSIS — I10 Essential (primary) hypertension: Secondary | ICD-10-CM | POA: Insufficient documentation

## 2022-01-03 DIAGNOSIS — K219 Gastro-esophageal reflux disease without esophagitis: Secondary | ICD-10-CM | POA: Diagnosis present

## 2022-01-03 LAB — BASIC METABOLIC PANEL
Anion gap: 9 (ref 5–15)
BUN: 14 mg/dL (ref 6–20)
CO2: 22 mmol/L (ref 22–32)
Calcium: 8.6 mg/dL — ABNORMAL LOW (ref 8.9–10.3)
Chloride: 104 mmol/L (ref 98–111)
Creatinine, Ser: 0.7 mg/dL (ref 0.61–1.24)
GFR, Estimated: >60 mL/min (ref >60)
Glucose, Bld: 310 mg/dL — ABNORMAL HIGH (ref 70–99)
Potassium: 3.4 mmol/L — ABNORMAL LOW (ref 3.5–5.1)
Sodium: 135 mmol/L (ref 135–145)

## 2022-01-03 LAB — TROPONIN I (HIGH SENSITIVITY)
Troponin I (High Sensitivity): 6 ng/L (ref <18)
Troponin I (High Sensitivity): 6 ng/L (ref <18)

## 2022-01-03 LAB — LIPID PANEL
Cholesterol: 167 mg/dL (ref 0–200)
HDL: 32 mg/dL — ABNORMAL LOW (ref >40)
LDL Cholesterol: 63 mg/dL (ref 0–99)
Total CHOL/HDL Ratio: 5.2 RATIO
Triglycerides: 359 mg/dL — ABNORMAL HIGH (ref <150)
VLDL: 72 mg/dL — ABNORMAL HIGH (ref 0–40)

## 2022-01-03 LAB — CBC WITH DIFFERENTIAL/PLATELET
Abs Immature Granulocytes: 0.07 10*3/uL (ref 0.00–0.07)
Basophils Absolute: 0.1 10*3/uL (ref 0.0–0.1)
Basophils Relative: 1 %
Eosinophils Absolute: 0.2 10*3/uL (ref 0.0–0.5)
Eosinophils Relative: 2 %
HCT: 42.3 % (ref 39.0–52.0)
Hemoglobin: 13.8 g/dL (ref 13.0–17.0)
Immature Granulocytes: 1 %
Lymphocytes Relative: 21 %
Lymphs Abs: 2.1 10*3/uL (ref 0.7–4.0)
MCH: 26.4 pg (ref 26.0–34.0)
MCHC: 32.6 g/dL (ref 30.0–36.0)
MCV: 81 fL (ref 80.0–100.0)
Monocytes Absolute: 0.9 10*3/uL (ref 0.1–1.0)
Monocytes Relative: 9 %
Neutro Abs: 6.7 10*3/uL (ref 1.7–7.7)
Neutrophils Relative %: 66 %
Platelets: 492 10*3/uL — ABNORMAL HIGH (ref 150–400)
RBC: 5.22 MIL/uL (ref 4.22–5.81)
RDW: 17.7 % — ABNORMAL HIGH (ref 11.5–15.5)
WBC: 10 10*3/uL (ref 4.0–10.5)
nRBC: 0 % (ref 0.0–0.2)

## 2022-01-03 LAB — CBC
HCT: 42.3 % (ref 39.0–52.0)
Hemoglobin: 13.8 g/dL (ref 13.0–17.0)
MCH: 26.3 pg (ref 26.0–34.0)
MCHC: 32.6 g/dL (ref 30.0–36.0)
MCV: 80.6 fL (ref 80.0–100.0)
Platelets: 481 10*3/uL — ABNORMAL HIGH (ref 150–400)
RBC: 5.25 MIL/uL (ref 4.22–5.81)
RDW: 17.3 % — ABNORMAL HIGH (ref 11.5–15.5)
WBC: 11.2 10*3/uL — ABNORMAL HIGH (ref 4.0–10.5)
nRBC: 0 % (ref 0.0–0.2)

## 2022-01-03 LAB — GLUCOSE, CAPILLARY
Glucose-Capillary: 214 mg/dL — ABNORMAL HIGH (ref 70–99)
Glucose-Capillary: 196 mg/dL — ABNORMAL HIGH (ref 70–99)

## 2022-01-03 LAB — HEMOGLOBIN A1C
Hgb A1c MFr Bld: 10.6 % — ABNORMAL HIGH (ref 4.8–5.6)
Mean Plasma Glucose: 257.52 mg/dL

## 2022-01-03 LAB — D-DIMER, QUANTITATIVE: D-Dimer, Quant: 0.48 ug/mL-FEU (ref 0.00–0.50)

## 2022-01-03 LAB — RESP PANEL BY RT-PCR (FLU A&B, COVID) ARPGX2
Influenza A by PCR: NEGATIVE
Influenza B by PCR: NEGATIVE
SARS Coronavirus 2 by RT PCR: NEGATIVE

## 2022-01-03 LAB — CBG MONITORING, ED: Glucose-Capillary: 230 mg/dL — ABNORMAL HIGH (ref 70–99)

## 2022-01-03 LAB — CREATININE, SERUM
Creatinine, Ser: 0.66 mg/dL (ref 0.61–1.24)
GFR, Estimated: >60 mL/min (ref >60)

## 2022-01-03 LAB — BRAIN NATRIURETIC PEPTIDE: B Natriuretic Peptide: 15 pg/mL (ref 0.0–100.0)

## 2022-01-03 MED ORDER — TAMSULOSIN HCL 0.4 MG PO CAPS
0.4000 mg | ORAL_CAPSULE | Freq: Every morning | ORAL | Status: DC
  Administered 2022-01-04 – 2022-01-05 (×2): 0.4 mg via ORAL
  Filled 2022-01-03 (×2): qty 1

## 2022-01-03 MED ORDER — HEPARIN SODIUM (PORCINE) 5000 UNIT/ML IJ SOLN
5000.0000 [IU] | Freq: Three times a day (TID) | INTRAMUSCULAR | Status: DC
Start: 2022-01-03 — End: 2022-01-05
  Administered 2022-01-03 – 2022-01-05 (×5): 5000 [IU] via SUBCUTANEOUS
  Filled 2022-01-03 (×5): qty 1

## 2022-01-03 MED ORDER — LEVOTHYROXINE SODIUM 75 MCG PO TABS
150.0000 ug | ORAL_TABLET | Freq: Every day | ORAL | Status: DC
  Administered 2022-01-04 – 2022-01-05 (×2): 150 ug via ORAL
  Filled 2022-01-03 (×2): qty 2

## 2022-01-03 MED ORDER — OMEGA-3 FISH OIL 1000 MG PO CAPS: 1.0000 | ORAL_CAPSULE | Freq: Two times a day (BID) | ORAL | Status: DC

## 2022-01-03 MED ORDER — ADULT MULTIVITAMIN W/MINERALS CH
1.0000 | ORAL_TABLET | Freq: Every day | ORAL | Status: DC
  Administered 2022-01-04 – 2022-01-05 (×2): 1 via ORAL
  Filled 2022-01-03 (×2): qty 1

## 2022-01-03 MED ORDER — AMLODIPINE BESYLATE 5 MG PO TABS
5.0000 mg | ORAL_TABLET | Freq: Once | ORAL | Status: AC
  Administered 2022-01-03: 5 mg via ORAL
  Filled 2022-01-03: qty 1

## 2022-01-03 MED ORDER — OMEGA-3-ACID ETHYL ESTERS 1 G PO CAPS
1.0000 g | ORAL_CAPSULE | Freq: Two times a day (BID) | ORAL | Status: DC
  Administered 2022-01-03 – 2022-01-05 (×4): 1 g via ORAL
  Filled 2022-01-03 (×4): qty 1

## 2022-01-03 MED ORDER — SIMVASTATIN 20 MG PO TABS
40.0000 mg | ORAL_TABLET | Freq: Every day | ORAL | Status: DC
Start: 2022-01-03 — End: 2022-01-05
  Administered 2022-01-03 – 2022-01-04 (×2): 40 mg via ORAL
  Filled 2022-01-03 (×2): qty 2

## 2022-01-03 MED ORDER — DULOXETINE HCL 60 MG PO CPEP
120.0000 mg | ORAL_CAPSULE | Freq: Every day | ORAL | Status: DC
  Administered 2022-01-03 – 2022-01-04 (×2): 120 mg via ORAL
  Filled 2022-01-03 (×2): qty 2

## 2022-01-03 MED ORDER — AMLODIPINE BESYLATE 10 MG PO TABS
10.0000 mg | ORAL_TABLET | Freq: Every day | ORAL | Status: DC
  Administered 2022-01-04 – 2022-01-05 (×2): 10 mg via ORAL
  Filled 2022-01-03 (×2): qty 2

## 2022-01-03 MED ORDER — PANTOPRAZOLE SODIUM 40 MG PO TBEC
40.0000 mg | DELAYED_RELEASE_TABLET | Freq: Every day | ORAL | Status: DC
  Administered 2022-01-04 – 2022-01-05 (×2): 40 mg via ORAL
  Filled 2022-01-03 (×2): qty 1

## 2022-01-03 MED ORDER — POTASSIUM CHLORIDE CRYS ER 20 MEQ PO TBCR
20.0000 meq | EXTENDED_RELEASE_TABLET | Freq: Four times a day (QID) | ORAL | Status: AC
  Administered 2022-01-03 (×2): 20 meq via ORAL
  Filled 2022-01-03 (×2): qty 1

## 2022-01-03 MED ORDER — ONDANSETRON HCL 4 MG/2ML IJ SOLN
4.0000 mg | Freq: Four times a day (QID) | INTRAMUSCULAR | Status: DC | PRN
  Administered 2022-01-04: 4 mg via INTRAVENOUS
  Filled 2022-01-03: qty 2

## 2022-01-03 MED ORDER — HYDROCORTISONE 10 MG PO TABS
10.0000 mg | ORAL_TABLET | Freq: Every day | ORAL | Status: DC
Start: 2022-01-04 — End: 2022-01-03

## 2022-01-03 MED ORDER — ASPIRIN 81 MG PO TBEC
81.0000 mg | DELAYED_RELEASE_TABLET | Freq: Every evening | ORAL | Status: DC
  Administered 2022-01-03 – 2022-01-04 (×2): 81 mg via ORAL
  Filled 2022-01-03 (×2): qty 1

## 2022-01-03 MED ORDER — VITAMIN D 25 MCG (1000 UNIT) PO TABS
1000.0000 [IU] | ORAL_TABLET | Freq: Every day | ORAL | Status: DC
  Administered 2022-01-03 – 2022-01-05 (×3): 1000 [IU] via ORAL
  Filled 2022-01-03 (×3): qty 1

## 2022-01-03 MED ORDER — AMLODIPINE BESYLATE 5 MG PO TABS
5.0000 mg | ORAL_TABLET | Freq: Every day | ORAL | Status: DC
Start: 2022-01-03 — End: 2022-01-03

## 2022-01-03 MED ORDER — NITROGLYCERIN 0.4 MG SL SUBL
0.4000 mg | SUBLINGUAL_TABLET | SUBLINGUAL | Status: DC | PRN
  Administered 2022-01-05: 0.4 mg via SUBLINGUAL
  Filled 2022-01-03 (×2): qty 1

## 2022-01-03 MED ORDER — AMLODIPINE BESYLATE 5 MG PO TABS: 5.0000 mg | ORAL_TABLET | Freq: Every day | ORAL | Status: DC

## 2022-01-03 MED ORDER — ACETAMINOPHEN 325 MG PO TABS: 650.0000 mg | ORAL_TABLET | ORAL | Status: DC | PRN

## 2022-01-03 MED ORDER — INSULIN ASPART 100 UNIT/ML IJ SOLN: 0.0000 [IU] | Freq: Every day | INTRAMUSCULAR | Status: DC

## 2022-01-03 MED ORDER — HYDROCORTISONE 20 MG PO TABS
20.0000 mg | ORAL_TABLET | Freq: Every day | ORAL | Status: DC
Start: 2022-01-04 — End: 2022-01-05
  Administered 2022-01-05: 20 mg via ORAL
  Filled 2022-01-03 (×6): qty 2

## 2022-01-03 MED ORDER — LOSARTAN POTASSIUM 50 MG PO TABS
100.0000 mg | ORAL_TABLET | Freq: Every day | ORAL | Status: DC
  Administered 2022-01-04 – 2022-01-05 (×2): 100 mg via ORAL
  Filled 2022-01-03 (×2): qty 2

## 2022-01-03 MED ORDER — CARVEDILOL 3.125 MG PO TABS
3.1250 mg | ORAL_TABLET | Freq: Two times a day (BID) | ORAL | Status: DC
  Administered 2022-01-03 – 2022-01-05 (×4): 3.125 mg via ORAL
  Filled 2022-01-03 (×4): qty 1

## 2022-01-03 MED ORDER — INSULIN ASPART 100 UNIT/ML IJ SOLN
0.0000 [IU] | Freq: Three times a day (TID) | INTRAMUSCULAR | Status: DC
  Administered 2022-01-03: 5 [IU] via SUBCUTANEOUS

## 2022-01-03 NOTE — ED Triage Notes (Signed)
CP x 2 weeks, center to left side of chest, worse with exertion Today pain started to get worse than it has been last 2 weeks.  SHOB x 2 weeks worse today intermittently

## 2022-01-03 NOTE — Progress Notes (Signed)
Patient using home CPAP independently. Made sure machine was working appropriately and was plugged into red outlet. Told patient to call if he needed anything.

## 2022-01-03 NOTE — ED Provider Notes (Signed)
[facility] EMERGENCY DEPARTMENT Provider Note   CSN: [id number] Arrival date & time: 01/03/22  1210     History  Chief Complaint  Patient presents with   Chest Pain    [name] [name] is a 56 y.o. male. With past medical history of asthma, diabetes, hypertension, HLD, OSA on CPAP who presents to the emergency department with chest pain.  States for the past two weeks he has been having intermittent, left-sided, non-radiating chest pain that he describes as "discomfort." States in addition to the chest pains he feels increasingly short of breath and having difficulty getting "a whole breath." States that his symptoms have been worse when laying down and has added 1 pillow (total of 4) under his head. He gets associated hot flashes. He denies palpitations, but states he hears his heart beat in his ears. Denies cough or fever. States he gets intermittent swelling in his ankles.    Chest Pain Associated symptoms: diaphoresis and shortness of breath   Associated symptoms: no nausea and no palpitations        Home Medications Prior to Admission medications   Medication Sig Start Date End Date Taking? Authorizing Provider  amLODipine (NORVASC) 5 MG tablet Take 5 mg by mouth daily. 11/17/21   [provider]  aspirin EC 81 MG tablet Take 81 mg by mouth every evening.     [provider]  Cholecalciferol (VITAMIN D3) 125 MCG (5000 UT) CAPS Take 1 capsule by mouth 2 (two) times a week.    [provider]  DULoxetine (CYMBALTA) 60 MG capsule Take 120 mg by mouth at bedtime.     [provider]  glimepiride (AMARYL) 2 MG tablet Take 2 mg by mouth 2 (two) times daily. 09/09/19   [provider]  hydrocortisone (CORTEF) 20 MG tablet Take 0.5 tablets (10 mg total) by mouth daily. For cortisol replacement Patient taking differently: Take 40 mg by mouth daily. For cortisol replacement 08/23/11   Darrol Jump, MD  levothyroxine (SYNTHROID) 150 MCG tablet  Take 150 mcg by mouth every morning. 09/30/19   [provider]  losartan (COZAAR) 100 MG tablet Take 100 mg by mouth daily. 04/01/17   [provider]  metFORMIN (GLUCOPHAGE) 1000 MG tablet Take 1,000 mg by mouth 2 (two) times daily. 10/21/17   [provider]  Multiple Vitamins-Minerals (MENS MULTIVITAMIN PLUS) TABS Take 1 tablet by mouth daily. /For nutritional supplementation. 08/23/11   Darrol Jump, MD  Omega-3 Fatty Acids (OMEGA-3 FISH OIL PO) Take 1 capsule by mouth at bedtime.     [provider]  omeprazole (PRILOSEC) 40 MG capsule Take 1 capsule (40 mg total) by mouth daily. Patient taking differently: Take 40 mg by mouth in the morning. 07/16/14   Annitta Needs, NP  simvastatin (ZOCOR) 40 MG tablet Take 40 mg by mouth at bedtime.  11/08/17   [provider]  sitaGLIPtin (JANUVIA) 100 MG tablet Take 100 mg by mouth at bedtime.     [provider]  tamsulosin (FLOMAX) 0.4 MG CAPS capsule Take 0.4 mg by mouth in the morning.     [provider]      Allergies    Patient has no known allergies.    Review of Systems   Review of Systems  Constitutional:  Positive for diaphoresis.  Respiratory:  Positive for chest tightness, shortness of breath and wheezing.   Cardiovascular:  Positive for chest pain and leg swelling. Negative for palpitations.  Gastrointestinal:  Negative for nausea.  All other systems reviewed and are negative.   Physical Exam Updated Vital Signs BP (!) 172/84 (BP Location: Left Arm)   Pulse 95   Temp 98.3 F (36.8 C) (Oral)   Resp 18   Ht '5\' 7"'$  (1.702 m)   Wt 136.1 kg   SpO2 97%   BMI 46.99 kg/m  Physical Exam Vitals and nursing note reviewed.  Constitutional:      General: He is not in acute distress.    Appearance: Normal appearance. He is obese. He is not ill-appearing.  HENT:     Head: Normocephalic and atraumatic.     Nose: Nose normal.     Mouth/Throat:     Mouth: Mucous membranes  are moist.     Pharynx: Oropharynx is clear.  Eyes:     General: No scleral icterus.    Extraocular Movements: Extraocular movements intact.     Pupils: Pupils are equal, round, and reactive to light.  Cardiovascular:     Rate and Rhythm: Normal rate and regular rhythm.     Pulses:          Radial pulses are 2+ on the right side and 2+ on the left side.       Dorsalis pedis pulses are 1+ on the right side and 1+ on the left side.     Heart sounds: Normal heart sounds. No murmur heard. Pulmonary:     Effort: Pulmonary effort is normal. No tachypnea or respiratory distress.     Breath sounds: Normal breath sounds.  Chest:     Chest wall: No tenderness.  Abdominal:     General: Bowel sounds are normal.     Palpations: Abdomen is soft.     Tenderness: There is no abdominal tenderness.  Musculoskeletal:        General: Normal range of motion.     Cervical back: Normal range of motion.     Right lower leg: No edema.     Left lower leg: No edema.  Skin:    General: Skin is warm and dry.     Capillary Refill: Capillary refill takes less than 2 seconds.  Neurological:     General: No focal deficit present.     Mental Status: He is alert and oriented to person, place, and time. Mental status is at baseline.  Psychiatric:        Mood and Affect: Mood normal.        Behavior: Behavior normal.        Thought Content: Thought content normal.        Judgment: Judgment normal.    ED Results / Procedures / Treatments   Labs (all labs ordered are listed, but only abnormal results are displayed) Labs Reviewed  BASIC METABOLIC PANEL - Abnormal; Notable for the following components:      Result Value   Potassium 3.4 (*)    Glucose, Bld 310 (*)    Calcium 8.6 (*)    All other components within normal limits  CBC WITH DIFFERENTIAL/PLATELET - Abnormal; Notable for the following components:   RDW 17.7 (*)    Platelets 492 (*)    All other components within normal limits  RESP PANEL BY  RT-PCR (FLU A&B, COVID) ARPGX2  BRAIN NATRIURETIC PEPTIDE  LIPID PANEL  TROPONIN I (HIGH SENSITIVITY)  TROPONIN I (HIGH SENSITIVITY)   EKG None  Radiology DG Chest 2 View  Result Date: 01/03/2022 CLINICAL DATA:  Short of breath  and chest pain EXAM: CHEST - 2 VIEW COMPARISON:  Chest 12/13/2021 in FINDINGS: The heart size and mediastinal contours are within normal limits. Both lungs are clear. The visualized skeletal structures are unremarkable. IMPRESSION: No active cardiopulmonary disease. Electronically Signed   By: Franchot Gallo M.D.   On: 01/03/2022 13:28    Procedures Procedures   Medications Ordered in ED Medications - No data to display  ED Course/ Medical Decision Making/ A&P                           Medical Decision Making Amount and/or Complexity of Data Reviewed Labs: ordered. Radiology: ordered.  Risk Decision regarding hospitalization.  This patient presents to the ED for concern of chest pain, this involves an extensive number of treatment options, and is a complaint that carries with it a high risk of complications and morbidity.  The differential diagnosis includes Acute chest syndrome, stable angina, atypical angina, pulmonary embolism, pneumothorax, dissection, pleural effusion, CHF, COPD, asthma, myocarditis, pericarditis, chest wall pain   Co morbidities that complicate the patient evaluation HTN, OSA, HLD, DM, asthma   Additional history obtained:  Additional history obtained from: External records from outside source obtained and reviewed including:  EKG: EKG: normal EKG, normal sinus rhythm.   Cardiac Monitoring: The patient was maintained on a cardiac monitor.  I personally viewed and interpreted the cardiac monitored which showed an underlying rhythm of: sinus rhythm   Lab Results: I personally ordered, reviewed, and interpreted labs. Pertinent results include: CBC within normal limits BMP within normal limits COVID and flu  negative Troponin 6, 6, delta 0 BNP 15  Imaging Studies ordered:  I ordered imaging studies which included x-ray.  I independently reviewed & interpreted imaging & am in agreement with radiology impression. Imaging shows: Chest x-ray within normal limits  Medications  -I reviewed the patient's home medications and did not make adjustments. -I did not prescribe new home medications.  Tests Considered: N/A  Critical Interventions: Cardiology consult  Consultations: I requested consultation with the cardiologist, Dr. Johney Frame,  and discussed lab and imaging findings as well as pertinent plan - they recommend: will come evaluation patient.  After evaluation, Dr. Johney Frame request medicine admission for stress testing  SDH None identified  ED Course:  56 year old male who presents to the emergency department for chest pain.  Clinically does not appear fluid volume overloaded.  He is hemodynamically stable.  Physical exam without murmur or JVD.  Although he does not appear fluid volume overloaded, increasing number of pillows that he is sleeping on at night and has intermittent lower extremity swelling.  Question whether he is having heart failure.  Ordered a BNP which was negative.  Chest x-ray without pleural effusions, vascular congestion.  Doubt heart failure at this time. EKG without ischemia or infarction, troponin x2 negative, doubt ACS at this time. Symptoms are inconsistent with dissection, PE, myocarditis, pericarditis or tamponade.  HEART Score: 4   He does have somewhat elevated heart score and his presentation of symptoms is concerning for cardiac etiology of his chest pain.  I consulted and spoke with Dr. Johney Frame.  She evaluated the patient at bedside and recommends that he has a medicine admission and will have stress testing and echo.  Patient is agreeable to plan.  Consulted and spoke with Dr. Waldron Labs, hospitalist, who agrees to admit the patient  After  consideration of the diagnostic results and the patients response to treatment, I feel  that the patent would benefit from admission.. The patient has been appropriately medically screened and/or stabilized in the ED.  Final Clinical Impression(s) / ED Diagnoses Final diagnoses:  Chest pain, unspecified type    Rx / DC Orders ED Discharge Orders     None         Mickie Hillier, PA-C 01/03/22 1611    Kommor, Debe Coder, MD 01/03/22 1755

## 2022-01-03 NOTE — Plan of Care (Signed)
  Problem: Education: Goal: Knowledge of General Education information will improve Description: Including pain rating scale, medication(s)/side effects and non-pharmacologic comfort measures Outcome: Progressing   Problem: Clinical Measurements: Goal: Cardiovascular complication will be avoided Outcome: Progressing   Problem: Activity: Goal: Risk for activity intolerance will decrease Outcome: Progressing   Problem: Nutrition: Goal: Adequate nutrition will be maintained Outcome: Progressing   Problem: Elimination: Goal: Will not experience complications related to urinary retention Outcome: Progressing   Problem: Pain Managment: Goal: General experience of comfort will improve Outcome: Progressing   Problem: Safety: Goal: Ability to remain free from injury will improve Outcome: Progressing   Problem: Skin Integrity: Goal: Risk for impaired skin integrity will decrease Outcome: Progressing

## 2022-01-03 NOTE — H&P (Addendum)
TRH H&P   Patient Demographics:    [name] [name], is a 56 y.o. male  MRN: [id number]   DOB - 01/16/1966  Admit Date - 01/03/2022  Outpatient Primary MD for the patient is Sharilyn Sites, MD  Referring MD/NP/PA: Beaulah Corin Erskine Speed  Patient coming from: home  Chief Complaint  Patient presents with   Chest Pain      HPI:    Tracker Mance  is a 56 y.o. male, with past medical history of hypertension, hyperlipidemia, diabetes mellitus, morbid obesity, hypothyroidism, hepatic steatosis, depression. 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    Review of systems:      A full 10 point Review of Systems was done, except as stated above, all other Review of Systems were negative.   With Past History of the following :    Past Medical History:  Diagnosis Date   Arthritis    Asthma    as child   Back pain, chronic 02/18/11   BULGING DISK   Complication of anesthesia    "woke up during spleenectomy at [facility]".   Diabetes mellitus without complication (HCC)    GERD (gastroesophageal reflux  disease)    erosive reflux esophagitis, small HH   Hemorrhoids    History of kidney stones    Hyperlipemia    Hypertension    Hypothyroidism    IBS (irritable bowel syndrome)    Low serum cortisol level    Dr Suzette Battiest   Lower extremity edema    Nephrolithiasis 12/19/2012   Plantar fasciitis    Restless leg syndrome    S/P colonoscopy 08/2008   int/ext hemorrhoids. friable, rectal polyp   S/P endoscopy 01/10/09   Dr Vivi Ferns, [facility]   Sleep apnea    cpap   Spinal stenosis of lumbar region 12/18/2012   Thyroid disease    Tubular adenoma of colon 2000   UTI (lower urinary tract infection)       Past Surgical History:  Procedure Laterality Date   CHOLECYSTECTOMY  2006   COLONOSCOPY  2000   Dr. Gala Romney- tubular adenoma   COLONOSCOPY  08/10/2008   Dr. Gala Romney-  IMPRESSION:  Prominent external anal canal hemorrhoids, hyperplastic polyp   COLONOSCOPY N/A 10/08/2013   WVP:XTGGYIRS colonic polyps -  removed as described above/Prominent grade 3 hemorrhoids -  likely source of hematochezia. tubular adenomas. next TCS 10/2016   COLONOSCOPY WITH PROPOFOL N/A 02/25/2017   Procedure: COLONOSCOPY WITH PROPOFOL;  Surgeon: Daneil Dolin, MD;  Grade 4 internal hemorrhoids, 1 tubular adenoma, recommendations to repeat in 5 years.    CYSTOSCOPY     with utetheral dilation   DISTAL BICEPS TENDON REPAIR  02/20/2012   Procedure: DISTAL BICEPS TENDON REPAIR;  Surgeon: Lorn Junes, MD;  Location: [facility];  Service: Orthopedics;  Laterality: Left;  left elbow distal biceps tendon repair   ESOPHAGOGASTRODUODENOSCOPY  2010   Dr. Gala Romney: normal   FLEXIBLE SIGMOIDOSCOPY N/A 11/10/2013   Dr.Rourk- internal/external hemorrhoids. external hemorrhoids grade 3 with some residual prolapsing tissue. normal rectum aside form hemorrhoids. s/p band placement.   HEMORRHOID BANDING N/A 11/10/2013   Procedure: HEMORRHOID BANDING;  Surgeon: Daneil Dolin, MD;  [facility];  Service: Endoscopy;   Laterality: N/A;   HERNIA REPAIR     INCISIONAL HERNIA REPAIR N/A 04/10/2017   Procedure: Fatima Blank HERNIORRHAPHY WITH MESH;  Surgeon: Aviva Signs, MD;  Location: [facility];  Service: General;  Laterality: N/A;   KNEE SURGERY     left   POLYPECTOMY  02/25/2017   Procedure: POLYPECTOMY - ASCENDING COLON;  Surgeon: Daneil Dolin, MD;  Location: [facility];  Service: Endoscopy;;   SPLENECTOMY  2003   itp      Social History:     Social History   Tobacco Use   Smoking status: Former    Packs/day: 0.50    Years: 30.00    Total pack years: 15.00    Types: Cigarettes    Quit date: 03/05/2007    Years since quitting: 14.8   Smokeless tobacco: Never  Substance Use Topics   Alcohol use: No    Comment: 2008        Family History :     Family History  Problem Relation Age of Onset   Colon cancer Father 78       deceased 6 months after diagnosis   Diabetes Brother       Home Medications:   Prior to Admission medications   Medication Sig Start Date End Date Taking? Authorizing Provider  amLODipine (NORVASC) 5 MG tablet Take 5 mg by mouth daily. 11/17/21  Yes [provider]  aspirin EC 81 MG tablet Take 81 mg by mouth every evening.    Yes [provider]  cholecalciferol (VITAMIN D3) 25 MCG (1000 UNIT) tablet Take 1,000 Units by mouth daily.   Yes [provider]  DULoxetine (CYMBALTA) 60 MG capsule Take 120 mg by mouth at bedtime.    Yes [provider]  glimepiride (AMARYL) 4 MG tablet Take 4 mg by mouth 2 (two) times daily. 12/22/21  Yes [provider]  hydrocortisone (CORTEF) 20 MG tablet Take 0.5 tablets (10 mg total) by mouth daily. For cortisol replacement Patient taking differently: Take 40 mg by mouth daily. For cortisol replacement 08/23/11  Yes Darrol Jump, MD  levothyroxine (SYNTHROID) 150 MCG tablet Take 150 mcg by mouth every morning. 09/30/19  Yes [provider]  losartan (COZAAR) 100 MG tablet Take  100 mg by mouth daily. 04/01/17  Yes [provider]  metFORMIN (GLUCOPHAGE) 1000 MG tablet Take  1,000 mg by mouth 2 (two) times daily. 10/21/17  Yes [provider]  Multiple Vitamins-Minerals (MENS MULTIVITAMIN PLUS) TABS Take 1 tablet by mouth daily. /For nutritional supplementation. 08/23/11  Yes Darrol Jump, MD  Omega-3 Fatty Acids (OMEGA-3 FISH OIL PO) Take 1 capsule by mouth at bedtime.    Yes [provider]  omeprazole (PRILOSEC) 40 MG capsule Take 1 capsule (40 mg total) by mouth daily. Patient taking differently: Take 40 mg by mouth in the morning. 07/16/14  Yes Annitta Needs, NP  simvastatin (ZOCOR) 40 MG tablet Take 40 mg by mouth at bedtime.  11/08/17  Yes [provider]  sitaGLIPtin (JANUVIA) 100 MG tablet Take 100 mg by mouth at bedtime.    Yes [provider]  tamsulosin (FLOMAX) 0.4 MG CAPS capsule Take 0.4 mg by mouth in the morning.    Yes [provider]     Allergies:    No Known Allergies   Physical Exam:   Vitals  Blood pressure (!) 161/88, pulse 100, temperature 98.3 F (36.8 C), temperature source Oral, resp. rate 17, height '5\' 7"'$  (1.702 m), weight 136.1 kg, SpO2 97 %.   1. General well-developed male, laying in bed, in no apparent distress  2. Normal affect and insight, Not Suicidal or Homicidal, Awake Alert, Oriented X 3.  3. No F.N deficits, ALL C.Nerves Intact, Strength 5/5 all 4 extremities, Sensation intact all 4 extremities, Plantars down going.  4. Ears and Eyes appear Normal, Conjunctivae clear, PERRLA. Moist Oral Mucosa.  5. Supple Neck, No JVD, No cervical lymphadenopathy appriciated, No Carotid Bruits.  6. Symmetrical Chest wall movement, Good air movement bilaterally, CTAB.  7. RRR, No Gallops, Rubs or Murmurs, No Parasternal Heave.  8. Positive Bowel Sounds, Abdomen Soft, No tenderness, No organomegaly appriciated,No rebound -guarding or rigidity.  9.  No Cyanosis, Normal Skin Turgor,  No Skin Rash or Bruise.  10. Good muscle tone,  joints appear normal , no effusions, Normal ROM.  11. No Palpable Lymph Nodes in Neck or Axillae     Data Review:    CBC Recent Labs  Lab 01/03/22 1245  WBC 10.0  HGB 13.8  HCT 42.3  PLT 492*  MCV 81.0  MCH 26.4  MCHC 32.6  RDW 17.7*  LYMPHSABS 2.1  MONOABS 0.9  EOSABS 0.2  BASOSABS 0.1   ------------------------------------------------------------------------------------------------------------------  Chemistries  Recent Labs  Lab 01/03/22 1245  NA 135  K 3.4*  CL 104  CO2 22  GLUCOSE 310*  BUN 14  CREATININE 0.70  CALCIUM 8.6*   ------------------------------------------------------------------------------------------------------------------ estimated creatinine clearance is 138.9 mL/min (by C-G formula based on SCr of 0.7 mg/dL). ------------------------------------------------------------------------------------------------------------------ No results for input(s): "TSH", "T4TOTAL", "T3FREE", "THYROIDAB" in the last 72 hours.  Invalid input(s): "FREET3"  Coagulation profile No results for input(s): "INR", "PROTIME" in the last 168 hours. ------------------------------------------------------------------------------------------------------------------- No results for input(s): "DDIMER" in the last 72 hours. -------------------------------------------------------------------------------------------------------------------  Cardiac Enzymes No results for input(s): "CKMB", "TROPONINI", "MYOGLOBIN" in the last 168 hours.  Invalid input(s): "CK" ------------------------------------------------------------------------------------------------------------------    Component Value Date/Time   BNP 15.0 01/03/2022 1245     ---------------------------------------------------------------------------------------------------------------  Urinalysis    Component Value Date/Time   COLORURINE YELLOW 10/24/2019  2100   APPEARANCEUR CLEAR 10/24/2019 2100   LABSPEC >1.046 (H) 10/24/2019 2100   PHURINE 6.0 10/24/2019 2100   GLUCOSEU >=500 (A) 10/24/2019 2100   HGBUR NEGATIVE 10/24/2019 2100   BILIRUBINUR NEGATIVE 10/24/2019 2100   [place] NEGATIVE 10/24/2019 2100  PROTEINUR NEGATIVE 10/24/2019 2100   UROBILINOGEN 0.2 12/16/2012 1820   NITRITE POSITIVE (A) 10/24/2019 2100   LEUKOCYTESUR NEGATIVE 10/24/2019 2100    ----------------------------------------------------------------------------------------------------------------   Imaging Results:    DG Chest 2 View  Result Date: 01/03/2022 CLINICAL DATA:  Short of breath and chest pain EXAM: CHEST - 2 VIEW COMPARISON:  Chest 12/13/2021 in FINDINGS: The heart size and mediastinal contours are within normal limits. Both lungs are clear. The visualized skeletal structures are unremarkable. IMPRESSION: No active cardiopulmonary disease. Electronically Signed   By: Franchot Gallo M.D.   On: 01/03/2022 13:28    My personal review of EKG: Rhythm NSR, (is reading at a flutter which appears inaccurate) LAFB,   Assessment & Plan:    Principal Problem:   Chest pain Active Problems:   OSA on CPAP   GERD   Hypothyroidism   Uncontrolled type 2 diabetes mellitus with hyperglycemia (HCC)   Obesity, Class III, BMI 40-49.9 (morbid obesity) ([place])   Essential hypertension    Chest pain Dyspnea -Chest pain has some typical feature, worsened by exertion, as well worsening by activity, and having multiple risk factors including diabetes mellitus, hyperlipidemia and hypertension, morbid obesity, will be admitted for further work-up. -Cardiology input greatly appreciated, plan for 2D echo, further planning on echo findings, CTA versus cardiac cath, so we will keep n.p.o. after midnight if any further procedure dissipated for tomorrow. -Continue with his home dose aspirin, losartan and simvastatin. -Start low-dose Coreg as blood pressure appears  elevated -Patient work as a Administrator, will obtain D-dimers, if elevated will proceed with CTA chest.  Hyperlipedemia -We will check lipid panel, meanwhile we will continue with home dose simvastatin of 40 mg oral daily  Hypertension -Continue with losartan -Resume 10 mg oral daily per cardiology recommendation -Blood pressure appears elevated so we will start on low-dose Coreg.  Diabetes mellitus -We will hold home medication including Januvia, Prandin and metformin during hospital stay, will check A1c and keep an insulin sliding scale meanwhile  Chronic pain syndrome/depression -Continue with Cymbalta   Adrenal insufficiency  - continue with home dose hydrocortisone of 20 mg oral daily  Hypothyroidism -Continue with Synthroid  Hypokalemia - potassium  3.4, will replet   morbid obesity -lifestyle modification, BMI of 46.9  DVT Prophylaxis Heparin  AM Labs Ordered, also please review Full Orders  Family Communication: Admission, patients condition and plan of care including tests being ordered have been discussed with the patient and wife at bedside who indicate understanding and agree with the plan and Code Status.  Code Status full  Likely DC to  home  Condition GUARDED    Consults called: [email or web address]    Admission status: observation    Time spent in minutes : 70 minutes   Phillips Climes M.D on 01/03/2022 at 4:45 PM   [facility] - Office  [phone]

## 2022-01-03 NOTE — Progress Notes (Signed)
Pt arrived to room 319 via wheelchair by Andee Poles, [place] and this RN from the ED. Received report from [place], Therapist, sports. See assessment. Will continue to monitor.

## 2022-01-03 NOTE — ED Notes (Addendum)
Patient refused nitro stated it makes him crazy. Patient educated on the importance of nitro and states he will take nitro if he is having severe enough chest pain and feels like he needs it. Patient states he does not need the nitro right now.

## 2022-01-03 NOTE — Consult Note (Addendum)
Cardiology Consultation:   Patient ID: [name] [name] MRN: [id number]; DOB: 1965/09/03  Admit date: 01/03/2022 Date of Consult: 01/03/2022  PCP:  Sharilyn Sites, MD   [facility] Providers Cardiologist:  None    Patient Profile:   [name] [name] is a 56 y.o. male with a hx of asthma, HTN, HLD, OSA on CPAP, DMII, and morbid obesity who is being seen 01/03/2022 for the evaluation of chest pain and SOB at the request of Dr. Matilde Sprang.  History of Present Illness:   Mr. [name] [name] is a 56 year old male with medical history as detailed above who does not follow regularly with Cardiology. Has prior TTE in 2014 that showed normal LVEF 55%, no significant valve disease.  He presented to [facility] with intermittent left sided chest pain and SOB that has been ongoing for the past two weeks. States that he has been having frequent sharp pains in his chest that are worsened by laying on his right side. Also complains that he has been feeling more SOB with activity or with laying flat. He is currently sleeping on 4 pillows as he feels "gurgling" in his throat if he lays flat. He also states that he is fatigued and feels like he can barely get out of bed due to symptoms. Also reports intermittent episodes of diaphoresis. Notably, he is undergoing work-up with GI for gastritis.   Here, trop 6>6. BNP 15. CXR with no active cardiopulmonary disease. ECG with NSR with PACs, LAFB, poor r-wave progression. Currently, he continues to feel SOB has intermittent chest pain and feels diaphoretic.    Past Medical History:  Diagnosis Date   Arthritis    Asthma    as child   Back pain, chronic 02/18/11   BULGING DISK   Complication of anesthesia    "woke up during spleenectomy at [facility]".   Diabetes mellitus without complication (HCC)    GERD (gastroesophageal reflux disease)    erosive reflux esophagitis, small HH   Hemorrhoids    History of kidney stones    Hyperlipemia    Hypertension    Hypothyroidism    IBS (irritable  bowel syndrome)    Low serum cortisol level    Dr Suzette Battiest   Lower extremity edema    Nephrolithiasis 12/19/2012   Plantar fasciitis    Restless leg syndrome    S/P colonoscopy 08/2008   int/ext hemorrhoids. friable, rectal polyp   S/P endoscopy 01/10/09   Dr Vivi Ferns, small [facility]   Sleep apnea    cpap   Spinal stenosis of lumbar region 12/18/2012   Thyroid disease    Tubular adenoma of colon 2000   UTI (lower urinary tract infection)     Past Surgical History:  Procedure Laterality Date   CHOLECYSTECTOMY  2006   COLONOSCOPY  2000   Dr. Gala Romney- tubular adenoma   COLONOSCOPY  08/10/2008   Dr. Gala Romney-  IMPRESSION:  Prominent external anal canal hemorrhoids, hyperplastic polyp   COLONOSCOPY N/A 10/08/2013   [id number] colonic polyps -  removed as described above/Prominent grade 3 hemorrhoids -  likely source of hematochezia. tubular adenomas. next TCS 10/2016   COLONOSCOPY WITH PROPOFOL N/A 02/25/2017   Procedure: COLONOSCOPY WITH PROPOFOL;  Surgeon: Daneil Dolin, MD;  Grade 4 internal hemorrhoids, 1 tubular adenoma, recommendations to repeat in 5 years.    CYSTOSCOPY     with utetheral dilation   DISTAL BICEPS TENDON REPAIR  02/20/2012   Procedure: DISTAL BICEPS TENDON REPAIR;  Surgeon: Lorn Junes, MD;  Location: [place];  Service: Orthopedics;  Laterality: Left;  left elbow distal biceps tendon repair   ESOPHAGOGASTRODUODENOSCOPY  2010   Dr. Gala Romney: normal   FLEXIBLE SIGMOIDOSCOPY N/A 11/10/2013   Dr.Rourk- internal/external hemorrhoids. external hemorrhoids grade 3 with some residual prolapsing tissue. normal rectum aside form hemorrhoids. s/p band placement.   HEMORRHOID BANDING N/A 11/10/2013   Procedure: HEMORRHOID BANDING;  Surgeon: Daneil Dolin, MD;  [facility];  Service: Endoscopy;  Laterality: N/A;   HERNIA REPAIR     INCISIONAL HERNIA REPAIR N/A 04/10/2017   Procedure: Fatima Blank HERNIORRHAPHY WITH MESH;  Surgeon: Aviva Signs, MD;  Location: [facility]  [facility];  Service: General;  Laterality: N/A;   KNEE SURGERY     left   POLYPECTOMY  02/25/2017   Procedure: POLYPECTOMY - ASCENDING COLON;  Surgeon: Daneil Dolin, MD;  [facility];  Service: Endoscopy;;   SPLENECTOMY  2003   itp     Home Medications:  Prior to Admission medications   Medication Sig Start Date End Date Taking? Authorizing Provider  amLODipine (NORVASC) 5 MG tablet Take 5 mg by mouth daily. 11/17/21  Yes [provider]  aspirin EC 81 MG tablet Take 81 mg by mouth every evening.    Yes [provider]  cholecalciferol (VITAMIN D3) 25 MCG (1000 UNIT) tablet Take 1,000 Units by mouth daily.   Yes [provider]  DULoxetine (CYMBALTA) 60 MG capsule Take 120 mg by mouth at bedtime.    Yes [provider]  glimepiride (AMARYL) 4 MG tablet Take 4 mg by mouth 2 (two) times daily. 12/22/21  Yes [provider]  hydrocortisone (CORTEF) 20 MG tablet Take 0.5 tablets (10 mg total) by mouth daily. For cortisol replacement Patient taking differently: Take 40 mg by mouth daily. For cortisol replacement 08/23/11  Yes Darrol Jump, MD  levothyroxine (SYNTHROID) 150 MCG tablet Take 150 mcg by mouth every morning. 09/30/19  Yes [provider]  losartan (COZAAR) 100 MG tablet Take 100 mg by mouth daily. 04/01/17  Yes [provider]  metFORMIN (GLUCOPHAGE) 1000 MG tablet Take 1,000 mg by mouth 2 (two) times daily. 10/21/17  Yes [provider]  Multiple Vitamins-Minerals (MENS MULTIVITAMIN PLUS) TABS Take 1 tablet by mouth daily. /For nutritional supplementation. 08/23/11  Yes Darrol Jump, MD  Omega-3 Fatty Acids (OMEGA-3 FISH OIL PO) Take 1 capsule by mouth at bedtime.    Yes [provider]  omeprazole (PRILOSEC) 40 MG capsule Take 1 capsule (40 mg total) by mouth daily. Patient taking differently: Take 40 mg by mouth in the morning. 07/16/14  Yes Annitta Needs, NP  simvastatin (ZOCOR) 40 MG tablet  Take 40 mg by mouth at bedtime.  11/08/17  Yes [provider]  sitaGLIPtin (JANUVIA) 100 MG tablet Take 100 mg by mouth at bedtime.    Yes [provider]  tamsulosin (FLOMAX) 0.4 MG CAPS capsule Take 0.4 mg by mouth in the morning.    Yes [provider]    Inpatient Medications: Scheduled Meds:  Continuous Infusions:  PRN Meds:   Allergies:   No Known Allergies  Social History:   Social History   Socioeconomic History   Marital status: Married    Spouse name: Not on file   Number of children: 1   Years of education: Not on file   Highest education level: Not on file  Occupational History   Occupation: truck driver-local  Employer: [employer]  Tobacco Use   Smoking status: Former    Packs/day: 0.50    Years: 30.00    Total pack years: 15.00    Types: Cigarettes    Quit date: 03/05/2007    Years since quitting: 14.8   Smokeless tobacco: Never  Vaping Use   Vaping Use: Never used  Substance and Sexual Activity   Alcohol use: No    Comment: 2008   Drug use: No    Comment: 2008   Sexual activity: Yes  Other Topics Concern   Not on file  Social History Narrative   1 healthy son   Social Determinants of Health   Financial Resource Strain: Not on file  Food Insecurity: Not on file  Transportation Needs: Not on file  Physical Activity: Not on file  Stress: Not on file  Social Connections: Not on file  Intimate Partner Violence: Not on file    Family History:    Family History  Problem Relation Age of Onset   Colon cancer Father 40       deceased 6 months after diagnosis   Diabetes Brother      ROS:  Please see the history of present illness.   All other ROS reviewed and negative.     Physical Exam/Data:   Vitals:   01/03/22 1306 01/03/22 1330 01/03/22 1400 01/03/22 1438  BP: (!) 161/83 (!) 163/86 (!) 147/65 (!) 163/84  Pulse: 86 87 89 90  Resp: 18 (!) 21 (!) 23 (!) 22  Temp: 98.3 F (36.8 C)     TempSrc: Oral      SpO2: 97% 95% 96% 95%  Weight:      Height:       No intake or output data in the 24 hours ending 01/03/22 1510    01/03/2022   12:18 PM 11/28/2021    2:22 PM 08/13/2021   12:37 PM  Last 3 Weights  Weight (lbs) 300 lb 294 lb 9.6 oz 295 lb  Weight (kg) 136.079 kg 133.63 kg 133.811 kg     Body mass index is 46.99 kg/m.  General:  Comfortable, NAD HEENT: normal Neck: no JVD Vascular: No carotid bruits; Distal pulses 2+ bilaterally Cardiac:  normal S1, S2; RRR; no murmur  Lungs:  clear to auscultation bilaterally, no wheezing, rhonchi or rales  Abd: soft, nontender, no hepatomegaly  Ext: no edema Musculoskeletal:  No deformities, BUE and BLE strength normal and equal Skin: warm and dry  Neuro:  CNs 2-12 intact, no focal abnormalities noted Psych:  Normal affect   EKG:  The EKG was personally reviewed and demonstrates:  NSR with PACs, LAFB, poor r-wave progression Telemetry:  Telemetry was personally reviewed and demonstrates:  NSR  Relevant CV Studies: TTE 12/2012: ------------------------------------------------------------  Left ventricle:  The cavity size was normal. There was mild  concentric hypertrophy. Systolic function was normal. The  estimated ejection fraction was in the range of 55% to 60%.  Wall motion was normal; there were no regional wall motion  abnormalities.   ------------------------------------------------------------  Aortic valve:   Mildly calcified annulus. Trileaflet; normal  thickness leaflets. Mobility was not restricted.  Doppler:  Transvalvular velocity was within the normal range. There  was no stenosis.  No regurgitation.   ------------------------------------------------------------  Aorta:  The aorta was mildly calcified. Aortic root: The  aortic root was normal in size.  Ascending aorta: The ascending aorta was normal in size.   ------------------------------------------------------------  Mitral valve:   Calcified annulus. Leaflet  separation was  normal. Mobility was not restricted.  Doppler:  Transvalvular velocity was within the normal range. There  was no evidence for stenosis.  No regurgitation.    Peak  gradient: 37m Hg (D).   ------------------------------------------------------------  Left atrium:  The atrium was normal in size.   ------------------------------------------------------------  Atrial septum:  Poorly visualized.   ------------------------------------------------------------  Right ventricle:  The cavity size was normal. Wall thickness  was normal. Systolic function was normal.   ------------------------------------------------------------  Pulmonic valve:   Not well visualized.  The valve appears to  be grossly normal.   ------------------------------------------------------------  Tricuspid valve:   Structurally normal valve.    Doppler:  Transvalvular velocity was within the normal range.  No  regurgitation.   ------------------------------------------------------------  Pulmonary artery:   The main pulmonary artery was  normal-sized.  Systolic pressure could not be accurately  estimated.   ------------------------------------------------------------  Right atrium:  The atrium was normal in size.   ------------------------------------------------------------  Pericardium:  There was no pericardial effusion.   ------------------------------------------------------------  Systemic veins:  Not visualized.  Inferior vena cava: The vessel was normal in size.   Laboratory Data:  High Sensitivity Troponin:   Recent Labs  Lab 01/03/22 1245  TROPONINIHS 6     Chemistry Recent Labs  Lab 01/03/22 1245  NA 135  K 3.4*  CL 104  CO2 22  GLUCOSE 310*  BUN 14  CREATININE 0.70  CALCIUM 8.6*  GFRNONAA >60  ANIONGAP 9    No results for input(s): "PROT", "ALBUMIN", "AST", "ALT", "ALKPHOS", "BILITOT" in the last 168 hours. Lipids No results for input(s): "CHOL", "TRIG", "HDL",  "LABVLDL", "LDLCALC", "CHOLHDL" in the last 168 hours.  Hematology Recent Labs  Lab 01/03/22 1245  WBC 10.0  RBC 5.22  HGB 13.8  HCT 42.3  MCV 81.0  MCH 26.4  MCHC 32.6  RDW 17.7*  PLT 492*   Thyroid No results for input(s): "TSH", "FREET4" in the last 168 hours.  BNP Recent Labs  Lab 01/03/22 1245  BNP 15.0    DDimer No results for input(s): "DDIMER" in the last 168 hours.   Radiology/Studies:  DG Chest 2 View  Result Date: 01/03/2022 CLINICAL DATA:  Short of breath and chest pain EXAM: CHEST - 2 VIEW COMPARISON:  Chest 12/13/2021 in FINDINGS: The heart size and mediastinal contours are within normal limits. Both lungs are clear. The visualized skeletal structures are unremarkable. IMPRESSION: No active cardiopulmonary disease. Electronically Signed   By: CFranchot GalloM.D.   On: 01/03/2022 13:28     Assessment and Plan:   #Chest Pain: #SOB: Patient's chest pain is atypical in nature and worse when lying on right side. Does not worsen with exertion but he does report exertional dyspnea that has been ongoing for years. Has several other symptoms as well as detailed above. Fortunately, ER work-up reassuring with no signs of ACS. Certainly has several risk factors for CAD including DMII, HLD, HTN and morbid obesity. Long discussion held with the patient and his wife. Given symptoms and risk factors, will check TTE and pending findings, will plan for inpatient vs outpatient ischemic work-up as [facility] and he may be better suited for outpatient coronary CTA vs cath pending echo findings. -Check TTE -Pending TTE findings, will determine further ischemic work-up (coronary CTA vs cath) -Continue ASA '81mg'$  daily -Continue simvastatin '40mg'$  daily -Continue losartan '100mg'$  daily -Can add coreg if BP remains persistently elevated -Undergoing  work-up for gastritis with GI  #HLD: -Continue simvastatin '40mg'$  daily -Check lipid  panel  #HTN: -Continue losartan '100mg'$  daily -Increase amlodipine to '10mg'$  daily -can add coreg if BP persistently elevated  #DMII: -Management per primary  #OSA on CPAP: -Continue CPAP  #Morbid Obesity: BMI 46   Risk Assessment/Risk Scores:     HEAR Score (for undifferentiated chest pain):  4          For questions or updates, please contact [facility] Please consult [email or web address] for contact info under    Signed, Freada Bergeron, MD  01/03/2022 3:10 PM

## 2022-01-04 ENCOUNTER — Observation Stay (HOSPITAL_BASED_OUTPATIENT_CLINIC_OR_DEPARTMENT_OTHER): Payer: BC Managed Care – PPO

## 2022-01-04 DIAGNOSIS — E039 Hypothyroidism, unspecified: Secondary | ICD-10-CM | POA: Diagnosis not present

## 2022-01-04 DIAGNOSIS — G4733 Obstructive sleep apnea (adult) (pediatric): Secondary | ICD-10-CM | POA: Diagnosis not present

## 2022-01-04 DIAGNOSIS — R079 Chest pain, unspecified: Secondary | ICD-10-CM | POA: Diagnosis not present

## 2022-01-04 DIAGNOSIS — K219 Gastro-esophageal reflux disease without esophagitis: Secondary | ICD-10-CM | POA: Diagnosis not present

## 2022-01-04 DIAGNOSIS — R072 Precordial pain: Secondary | ICD-10-CM | POA: Diagnosis not present

## 2022-01-04 DIAGNOSIS — I10 Essential (primary) hypertension: Secondary | ICD-10-CM | POA: Diagnosis not present

## 2022-01-04 DIAGNOSIS — E1165 Type 2 diabetes mellitus with hyperglycemia: Secondary | ICD-10-CM | POA: Diagnosis not present

## 2022-01-04 LAB — LIPID PANEL
Cholesterol: 180 mg/dL (ref 0–200)
HDL: 33 mg/dL — ABNORMAL LOW (ref >40)
LDL Cholesterol: 80 mg/dL (ref 0–99)
Total CHOL/HDL Ratio: 5.5 RATIO
Triglycerides: 333 mg/dL — ABNORMAL HIGH (ref <150)
VLDL: 67 mg/dL — ABNORMAL HIGH (ref 0–40)

## 2022-01-04 LAB — ECHOCARDIOGRAM COMPLETE
AR max vel: 2.33 cm2
AV Area VTI: 2.13 cm2
AV Area mean vel: 2.13 cm2
AV Mean grad: 4 mmHg
AV Peak grad: 8.2 mmHg
Ao pk vel: 1.43 m/s
Area-P 1/2: 3.81 cm2
Calc EF: 59.4 %
Height: 67 in
MV VTI: 2.31 cm2
S' Lateral: 3.5 cm
Single Plane A2C EF: 58 %
Single Plane A4C EF: 61.3 %
Weight: 4800 oz

## 2022-01-04 LAB — GLUCOSE, CAPILLARY
Glucose-Capillary: 226 mg/dL — ABNORMAL HIGH (ref 70–99)
Glucose-Capillary: 272 mg/dL — ABNORMAL HIGH (ref 70–99)
Glucose-Capillary: 251 mg/dL — ABNORMAL HIGH (ref 70–99)
Glucose-Capillary: 223 mg/dL — ABNORMAL HIGH (ref 70–99)

## 2022-01-04 LAB — HIV ANTIBODY (ROUTINE TESTING W REFLEX): HIV Screen 4th Generation wRfx: NONREACTIVE

## 2022-01-04 MED ORDER — METOPROLOL TARTRATE 50 MG PO TABS
100.0000 mg | ORAL_TABLET | ORAL | Status: DC
  Filled 2022-01-04: qty 2

## 2022-01-04 MED ORDER — PERFLUTREN LIPID MICROSPHERE
1.0000 mL | INTRAVENOUS | Status: AC | PRN
  Administered 2022-01-04: 6 mL via INTRAVENOUS

## 2022-01-04 MED ORDER — INSULIN ASPART 100 UNIT/ML IJ SOLN
6.0000 [IU] | Freq: Three times a day (TID) | INTRAMUSCULAR | Status: DC
  Administered 2022-01-04 – 2022-01-05 (×2): 6 [IU] via SUBCUTANEOUS

## 2022-01-04 MED ORDER — INSULIN ASPART 100 UNIT/ML IJ SOLN
0.0000 [IU] | Freq: Every day | INTRAMUSCULAR | Status: DC
  Administered 2022-01-04: 2 [IU] via SUBCUTANEOUS

## 2022-01-04 MED ORDER — INSULIN GLARGINE-YFGN 100 UNIT/ML ~~LOC~~ SOLN
20.0000 [IU] | Freq: Every day | SUBCUTANEOUS | Status: DC
  Administered 2022-01-04: 20 [IU] via SUBCUTANEOUS
  Filled 2022-01-04 (×3): qty 0.2

## 2022-01-04 MED ORDER — INSULIN ASPART 100 UNIT/ML IJ SOLN
0.0000 [IU] | Freq: Three times a day (TID) | INTRAMUSCULAR | Status: DC
  Administered 2022-01-04 – 2022-01-05 (×3): 11 [IU] via SUBCUTANEOUS

## 2022-01-04 NOTE — Progress Notes (Signed)
*  PRELIMINARY RESULTS* Echocardiogram 2D Echocardiogram has been performed.  [name] [name] 01/04/2022, 9:34 AM

## 2022-01-04 NOTE — Progress Notes (Signed)
  Transition of Care [facility]) Screening Note   Patient Details  Name: [name] [name] Date of Birth: 1965/11/14   Transition of [facility]) CM/SW Contact:    Iona Beard, [place] Phone Number: 01/04/2022, 10:13 AM    Transition of Care Department [facility]) has reviewed patient and no TOC needs have been identified at this time. We will continue to monitor patient advancement through interdisciplinary progression rounds. If new patient transition needs arise, please place a TOC consult.

## 2022-01-04 NOTE — Progress Notes (Signed)
Patient has on home unit.

## 2022-01-04 NOTE — Progress Notes (Signed)
Progress Note  Patient Name: [name] [name] Date of Encounter: 01/04/2022  [facility] Cardiologist: None   Subjective   Continues to have chest pain, SOB and episodes of sweating. Prelim review of TTE shows normal BiV function with no WMA. Discussed with patient at length.  Inpatient Medications    Scheduled Meds:  amLODipine  10 mg Oral Daily   aspirin EC  81 mg Oral QPM   carvedilol  3.125 mg Oral BID WC   cholecalciferol  1,000 Units Oral Daily   DULoxetine  120 mg Oral QHS   heparin  5,000 Units Subcutaneous Q8H   hydrocortisone  20 mg Oral Q breakfast   insulin aspart  0-20 Units Subcutaneous TID WC   insulin aspart  0-5 Units Subcutaneous QHS   insulin aspart  6 Units Subcutaneous TID WC   insulin glargine-yfgn  20 Units Subcutaneous Daily   levothyroxine  150 mcg Oral Q0600   losartan  100 mg Oral Daily   metoprolol tartrate  100 mg Oral On Call   multivitamin with minerals  1 tablet Oral Daily   omega-3 acid ethyl esters  1 g Oral BID   pantoprazole  40 mg Oral Daily   simvastatin  40 mg Oral QHS   tamsulosin  0.4 mg Oral q AM   Continuous Infusions:  PRN Meds: acetaminophen, nitroGLYCERIN, ondansetron (ZOFRAN) IV, perflutren lipid microspheres (DEFINITY) IV suspension   Vital Signs    Vitals:   01/03/22 1700 01/03/22 2116 01/04/22 0011 01/04/22 0532  BP: (!) 147/79 (!) 164/81 (!) 159/91 132/67  Pulse: 94 93 79 73  Resp: '19 20 20 17  '$ Temp: 98.7 F (37.1 C) 98.1 F (36.7 C) 97.9 F (36.6 C) 97.6 F (36.4 C)  TempSrc: Oral     SpO2: 95% 96% 94% 93%  Weight:      Height:        Intake/Output Summary (Last 24 hours) at 01/04/2022 1030 Last data filed at 01/04/2022 0600 Gross per 24 hour  Intake 240 ml  Output 475 ml  Net -235 ml      01/03/2022   12:18 PM 11/28/2021    2:22 PM 08/13/2021   12:37 PM  Last 3 Weights  Weight (lbs) 300 lb 294 lb 9.6 oz 295 lb  Weight (kg) 136.079 kg 133.63 kg 133.811 kg      Telemetry    NSR - Personally  Reviewed  ECG    No new tracing - Personally Reviewed  Physical Exam   GEN: No acute distress.   Neck: No JVD Cardiac: RRR, no murmurs, rubs, or gallops.  Respiratory: Diminished but clear GI: Obese, soft, NTTP MS: No edema; No deformity. Neuro:  Nonfocal  Psych: Normal affect   Labs    High Sensitivity Troponin:   Recent Labs  Lab 01/03/22 1245 01/03/22 1439  TROPONINIHS 6 6     Chemistry Recent Labs  Lab 01/03/22 1245 01/03/22 1902  NA 135  --   K 3.4*  --   CL 104  --   CO2 22  --   GLUCOSE 310*  --   BUN 14  --   CREATININE 0.70 0.66  CALCIUM 8.6*  --   GFRNONAA >60 >60  ANIONGAP 9  --     Lipids  Recent Labs  Lab 01/04/22 0433  CHOL 180  TRIG 333*  HDL 33*  LDLCALC 80  CHOLHDL 5.5    Hematology Recent Labs  Lab 01/03/22 1245 01/03/22 1902  WBC 10.0 11.2*  RBC 5.22 5.25  HGB 13.8 13.8  HCT 42.3 42.3  MCV 81.0 80.6  MCH 26.4 26.3  MCHC 32.6 32.6  RDW 17.7* 17.3*  PLT 492* 481*   Thyroid No results for input(s): "TSH", "FREET4" in the last 168 hours.  BNP Recent Labs  Lab 01/03/22 1245  BNP 15.0    DDimer  Recent Labs  Lab 01/03/22 1439  DDIMER 0.48     Radiology    DG Chest 2 View  Result Date: 01/03/2022 CLINICAL DATA:  Short of breath and chest pain EXAM: CHEST - 2 VIEW COMPARISON:  Chest 12/13/2021 in FINDINGS: The heart size and mediastinal contours are within normal limits. Both lungs are clear. The visualized skeletal structures are unremarkable. IMPRESSION: No active cardiopulmonary disease. Electronically Signed   By: Franchot Gallo M.D.   On: 01/03/2022 13:28    Cardiac Studies   TTE pending  Patient Profile     56 y.o. male with history of asthma, HTN, HLD, OSA on CPAP, DMII, and morbid obesity who presented to the ER with chest pain, SOB, and episodes of diaphoresis for which Cardiology was consulted.   Assessment & Plan    #Chest Pain: #SOB: Patient's chest pain is atypical in nature and worse when lying  on right side. Does not worsen with exertion but he does report exertional dyspnea that has been ongoing for years. Has several other symptoms including episodes of diaphoresis, generalized fatigue and malaise. Fortunately, ER work-up reassuring with normal trop, no ischemic changes on ECG, BNP normal, CXR without acute pathology. TTE on my prelim review with normal BiV function, no WMA. He certainly has several risk factors for CAD including HTN, HLD, morbid obesity, tobacco use and family history however his symptoms are atypical. Discussed option of going home and pursuing CTA as outpatient as I worry that his myoview may be equivocal due to his body habitus vs pursuing inpatient work-up with coronary CTA. He would much prefer to pursue further testing as inpatient as he is afraid that if he were to go home, he would come back due to symptoms. Will plan for [facility] for coronary CTA for further work-up. -Transfer to [facility] for coronary CTA as patient would much prefer inpatient work-up -Follow-up TTE -Continue ASA '81mg'$  daily -Continue simvastatin '40mg'$  daily -Continue losartan '100mg'$  daily -Continue coreg 3.'125mg'$  BID and uptitrate as needed -Undergoing work-up for gastritis with GI   #HLD: -Continue simvastatin '40mg'$  daily -LDL 63; TG elevated but was nonfasting panel   #HTN: -Continue losartan '100mg'$  daily -Continue amlodipine '10mg'$  daily -Continue coreg 3.'125mg'$  BID and uptitrate as needed   #DMII: -Management per primary   #OSA on CPAP: -Continue CPAP   #Morbid Obesity: BMI 46       For questions or updates, please contact [facility] Please consult [email or web address] for contact info under        Signed, Freada Bergeron, MD  01/04/2022, 10:30 AM

## 2022-01-04 NOTE — Progress Notes (Signed)
PROGRESS NOTE   [name] [name]  KKX:[id number] DOB: 1965-08-25 DOA: 01/03/2022 PCP: Sharilyn Sites, MD   Chief Complaint  Patient presents with   Chest Pain   Level of care: Telemetry Cardiac  Brief Admission History:  56 y.o. male, with past medical history of hypertension, hyperlipidemia, diabetes mellitus, morbid obesity, hypothyroidism, hepatic steatosis, depression. 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   Assessment and Plan:  Chest pain with Dyspnea  -Chest pain persists and after reassuring echo patient discussed with cardiology about transfer to [facility] for coronary CT study.   Pt has multiple risk factors including diabetes mellitus, hyperlipidemia and hypertension, morbid obesity, will be transferred to [facility] for further work-up. -Cardiology input greatly appreciated.   -Continue with his home dose aspirin, losartan and simvastatin. -he was started on low-dose Coreg as blood pressure appears elevated on admission  -Patient work as a Administrator,  D-dimers was reassuring.   Hyperlipedemia -We will check lipid panel, meanwhile we will continue with home dose simvastatin of 40 mg  oral daily Lipid Panel     Component Value Date/Time   CHOL 180 01/04/2022 0433   TRIG 333 (H) 01/04/2022 0433   HDL 33 (L) 01/04/2022 0433   CHOLHDL 5.5 01/04/2022 0433   VLDL 67 (H) 01/04/2022 0433   LDLCALC 80 01/04/2022 0433    Hypertension -Continue with losartan daily and carvedilol  -BPs are much better controlled    Diabetes mellitus, type 2 with vascular complications -uncontrolled as evidenced by an A1c>10% -We will hold home medication including Januvia, Prandin and metformin during hospital stay, will check A1c and keep an insulin sliding scale meanwhile CBG (last 3)  Recent Labs    01/03/22 1733 01/03/22 2119 01/04/22 0655  GLUCAP 214* 196* 226*   -added prandial coverage and novolog for meals with increased CBG monitoring   Chronic pain syndrome/depression -Continue with Cymbalta    Adrenal insufficiency  - continue with home dose hydrocortisone of 20 mg oral daily   Hypothyroidism -Continue with Synthroid   Hypokalemia - potassium  3.4, will replet    morbid obesity -lifestyle modification, BMI of 46.9   DVT Prophylaxis Heparin   AM Labs Ordered, also please review Full Orders  DVT prophylaxis: heparin Code Status: full  Family Communication: discussed with patient at bedside Disposition: Status is: Observation The patient remains OBS appropriate and will d/c before 2 midnights.   Consultants:  Cardiology  Procedures:  Echocardiogram: normal BiV function, no WMA Antimicrobials:  N/a  Subjective: Pt continues to have chest pain symptoms.  He spoke with cardiology and agreeable to coronary CT study which will require transfer to [facility].    Objective: Vitals:   01/03/22 1700 01/03/22 2116  01/04/22 0011 01/04/22 0532  BP: (!) 147/79 (!) 164/81 (!) 159/91 132/67  Pulse: 94 93 79 73  Resp: '19 20 20 17  '$ Temp: 98.7 F (37.1 C) 98.1 F (36.7 C) 97.9 F (36.6 C) 97.6 F (36.4 C)  TempSrc: Oral     SpO2: 95% 96% 94% 93%  Weight:       Height:        Intake/Output Summary (Last 24 hours) at 01/04/2022 1038 Last data filed at 01/04/2022 0600 Gross per 24 hour  Intake 240 ml  Output 475 ml  Net -235 ml   Filed Weights   01/03/22 1218  Weight: 136.1 kg   Examination:  General exam: Appears calm and comfortable  Respiratory system: Clear to auscultation. Respiratory effort normal. Cardiovascular system: normal S1 & S2 heard. No JVD, murmurs, rubs, gallops or clicks. No pedal edema. Gastrointestinal system: Abdomen is nondistended, soft and nontender. No organomegaly or masses felt. Normal bowel sounds heard. Central nervous system: Alert and oriented. No focal neurological deficits. Extremities: Symmetric 5 x 5 power. Skin: No rashes, lesions or ulcers. Psychiatry: Judgement and insight appear normal. Mood & affect appropriate.   Data Reviewed: I have personally reviewed following labs and imaging studies  CBC: Recent Labs  Lab 01/03/22 1245 01/03/22 1902  WBC 10.0 11.2*  NEUTROABS 6.7  --   HGB 13.8 13.8  HCT 42.3 42.3  MCV 81.0 80.6  PLT 492* 481*    Basic Metabolic Panel: Recent Labs  Lab 01/03/22 1245 01/03/22 1902  NA 135  --   K 3.4*  --   CL 104  --   CO2 22  --   GLUCOSE 310*  --   BUN 14  --   CREATININE 0.70 0.66  CALCIUM 8.6*  --     CBG: Recent Labs  Lab 01/03/22 1651 01/03/22 1733 01/03/22 2119 01/04/22 0655  GLUCAP 230* 214* 196* 226*    Recent Results (from the past 240 hour(s))  Resp Panel by RT-PCR (Flu A&B, Covid) Anterior Nasal Swab     Status: None   Collection Time: 01/03/22  1:05 PM   Specimen: Anterior Nasal Swab  Result Value Ref Range Status   SARS Coronavirus 2 by RT PCR NEGATIVE NEGATIVE Final    Comment: (NOTE) SARS-CoV-2 target nucleic acids are NOT DETECTED.  The SARS-CoV-2 RNA is generally detectable in upper respiratory specimens during the acute phase of infection. The lowest concentration of SARS-CoV-2 viral copies this assay can detect is 138  copies/mL. A negative result does not preclude SARS-Cov-2 infection and should not be used as the sole basis for treatment or other patient management decisions. A negative result may occur with  improper specimen collection/handling, submission of specimen other than nasopharyngeal swab, presence of viral mutation(s) within the areas targeted by this assay, and inadequate number of viral copies(<138 copies/mL). A negative result must be combined with clinical observations, patient history, and epidemiological information. The expected result is Negative.  Fact Sheet for Patients:  EntrepreneurPulse.com.au  Fact Sheet for Healthcare Providers:  IncredibleEmployment.be  This test is no t yet approved or cleared by the [place] FDA and  has been authorized for detection and/or diagnosis of SARS-CoV-2 by FDA under an Emergency Use Authorization (EUA). This EUA will remain  in effect (meaning this test can be used) for the duration of the COVID-19 declaration under Section 564(b)(1) of the Act, 21 U.S.C.section 360bbb-3(b)(1), unless the authorization is terminated  or revoked sooner.  Influenza A by PCR NEGATIVE NEGATIVE Final   Influenza B by PCR NEGATIVE NEGATIVE Final    Comment: (NOTE) The Xpert Xpress SARS-CoV-2/FLU/RSV plus assay is intended as an aid in the diagnosis of influenza from Nasopharyngeal swab specimens and should not be used as a sole basis for treatment. Nasal washings and aspirates are unacceptable for Xpert Xpress SARS-CoV-2/FLU/RSV testing.  Fact Sheet for Patients: EntrepreneurPulse.com.au  Fact Sheet for Healthcare Providers: IncredibleEmployment.be  This test is not yet approved or cleared by the [place] FDA and has been authorized for detection and/or diagnosis of SARS-CoV-2 by FDA under an Emergency Use Authorization (EUA). This EUA will remain in effect (meaning  this test can be used) for the duration of the COVID-19 declaration under Section 564(b)(1) of the Act, 21 U.S.C. section 360bbb-3(b)(1), unless the authorization is terminated or revoked.  Performed at [facility], [address] [place], [place] [address]      Radiology Studies: DG Chest 2 View  Result Date: 01/03/2022 CLINICAL DATA:  Short of breath and chest pain EXAM: CHEST - 2 VIEW COMPARISON:  Chest 12/13/2021 in FINDINGS: The heart size and mediastinal contours are within normal limits. Both lungs are clear. The visualized skeletal structures are unremarkable. IMPRESSION: No active cardiopulmonary disease. Electronically Signed   By: Franchot Gallo M.D.   On: 01/03/2022 13:28    Scheduled Meds:  amLODipine  10 mg Oral Daily   aspirin EC  81 mg Oral QPM   carvedilol  3.125 mg Oral BID WC   cholecalciferol  1,000 Units Oral Daily   DULoxetine  120 mg Oral QHS   heparin  5,000 Units Subcutaneous Q8H   hydrocortisone  20 mg Oral Q breakfast   insulin aspart  0-20 Units Subcutaneous TID WC   insulin aspart  0-5 Units Subcutaneous QHS   insulin aspart  6 Units Subcutaneous TID WC   insulin glargine-yfgn  20 Units Subcutaneous Daily   levothyroxine  150 mcg Oral Q0600   losartan  100 mg Oral Daily   metoprolol tartrate  100 mg Oral On Call   multivitamin with minerals  1 tablet Oral Daily   omega-3 acid ethyl esters  1 g Oral BID   pantoprazole  40 mg Oral Daily   simvastatin  40 mg Oral QHS   tamsulosin  0.4 mg Oral q AM   Continuous Infusions:   LOS: 0 days   Time spent: 38 mins  Jacqueleen Pulver Wynetta Emery, MD How to contact the [facility] Attending or Consulting provider [place] or covering provider during after hours [place], for this patient?  Check the care team in [facility] and look for a) attending/consulting [facility] provider listed and b) the [facility] team listed Log into [email or web address] and use [place]'s universal password to access. If you do not have the password, please contact the  hospital operator. Locate the [facility] provider you are looking for under [facility] and page to a number that you can be directly reached. If you still have difficulty reaching the provider, please page the [facility] (Director on Call) for the Hospitalists listed on amion for assistance.  01/04/2022, 10:38 AM

## 2022-01-04 NOTE — Inpatient Diabetes Management (Addendum)
Inpatient Diabetes Program Recommendations  AACE/ADA: New Consensus Statement on Inpatient Glycemic Control (2015)  Target Ranges:  Prepandial:   less than 140 mg/dL      Peak postprandial:   less than 180 mg/dL (1-2 hours)      Critically ill patients:  140 - 180 mg/dL   Lab Results  Component Value Date   GLUCAP 226 (H) 01/04/2022   HGBA1C 10.6 (H) 01/03/2022    Review of Glycemic Control  Diabetes history: DM 2 Outpatient Diabetes medications: Amaryl 4 mg bid, Metformin 1000 mg bid, Januvia 100 mg Daily Current orders for Inpatient glycemic control:  Novolog 0-20 units tid + hs Semglee 20 units Daily Novolog 6 units tid  [employer] with pt over the phone regarding A1c level of 10.6% on 8/2 and glucose control at home. Pt reports being on vial and syringe insulin about 1 year ago to get his levels down. Pt has not been practicing a diabetic diet and has maybe checked his glucose 1 time a week. Pt also reports his Thyroid levels have not been normal and his medication was just changed.   Pt reports being willing to take insulin again. Pt remembers using the vial and syringe,  D/c needs: -  Syringes order # [id number] -  [employer] order # [id number]  Thanks, Tama Headings RN, MSN, BC-ADM Inpatient Diabetes Coordinator Team Pager [phone] (8a-5p)

## 2022-01-05 ENCOUNTER — Observation Stay (HOSPITAL_BASED_OUTPATIENT_CLINIC_OR_DEPARTMENT_OTHER): Payer: BC Managed Care – PPO

## 2022-01-05 ENCOUNTER — Emergency Department (HOSPITAL_COMMUNITY)
Admission: EM | Admit: 2022-01-05 | Discharge: 2022-01-05 | Disposition: A | Discharge status: Nursing Home (Includes ICF) | Payer: BC Managed Care – PPO

## 2022-01-05 DIAGNOSIS — K219 Gastro-esophageal reflux disease without esophagitis: Secondary | ICD-10-CM

## 2022-01-05 DIAGNOSIS — R072 Precordial pain: Secondary | ICD-10-CM | POA: Diagnosis not present

## 2022-01-05 DIAGNOSIS — E039 Hypothyroidism, unspecified: Secondary | ICD-10-CM | POA: Diagnosis not present

## 2022-01-05 DIAGNOSIS — I10 Essential (primary) hypertension: Secondary | ICD-10-CM | POA: Diagnosis not present

## 2022-01-05 DIAGNOSIS — R079 Chest pain, unspecified: Secondary | ICD-10-CM | POA: Diagnosis not present

## 2022-01-05 LAB — GLUCOSE, CAPILLARY
Glucose-Capillary: 214 mg/dL — ABNORMAL HIGH (ref 70–99)
Glucose-Capillary: 271 mg/dL — ABNORMAL HIGH (ref 70–99)
Glucose-Capillary: 251 mg/dL — ABNORMAL HIGH (ref 70–99)

## 2022-01-05 MED ORDER — INSULIN ASPART 100 UNIT/ML IJ SOLN: 10.0000 [IU] | Freq: Three times a day (TID) | INTRAMUSCULAR | Status: DC

## 2022-01-05 MED ORDER — METOPROLOL TARTRATE 5 MG/5ML IV SOLN
INTRAVENOUS | Status: AC
  Filled 2022-01-05: qty 5

## 2022-01-05 MED ORDER — BLOOD GLUCOSE METER KIT: PACK | 0 refills | Status: DC

## 2022-01-05 MED ORDER — AMLODIPINE BESYLATE 10 MG PO TABS: 10.0000 mg | ORAL_TABLET | Freq: Every day | ORAL | 1 refills | Status: DC

## 2022-01-05 MED ORDER — IOHEXOL 350 MG/ML SOLN
100.0000 mL | Freq: Once | INTRAVENOUS | Status: AC | PRN
  Administered 2022-01-05: 100 mL via INTRAVENOUS

## 2022-01-05 MED ORDER — METOPROLOL TARTRATE 50 MG PO TABS
100.0000 mg | ORAL_TABLET | Freq: Once | ORAL | Status: AC
  Administered 2022-01-05: 100 mg via ORAL
  Filled 2022-01-05: qty 2

## 2022-01-05 MED ORDER — "INSULIN SYRINGE 31G X 5/16"" 0.5 ML MISC": 1.0000 | 1 refills | Status: DC

## 2022-01-05 MED ORDER — INSULIN GLARGINE-YFGN 100 UNIT/ML ~~LOC~~ SOLN
30.0000 [IU] | Freq: Every day | SUBCUTANEOUS | Status: DC
  Administered 2022-01-05: 30 [IU] via SUBCUTANEOUS
  Filled 2022-01-05 (×2): qty 0.3

## 2022-01-05 MED ORDER — CARVEDILOL 3.125 MG PO TABS: 3.1250 mg | ORAL_TABLET | Freq: Two times a day (BID) | ORAL | 1 refills | Status: DC

## 2022-01-05 MED ORDER — OMEPRAZOLE 40 MG PO CPDR: 40.0000 mg | DELAYED_RELEASE_CAPSULE | Freq: Two times a day (BID) | ORAL | 1 refills | Status: DC

## 2022-01-05 MED ORDER — INSULIN DETEMIR 100 UNIT/ML ~~LOC~~ SOLN: 15.0000 [IU] | Freq: Every day | SUBCUTANEOUS | 0 refills | Status: DC

## 2022-01-05 MED ORDER — HYDROCORTISONE 20 MG PO TABS: 20.0000 mg | ORAL_TABLET | Freq: Every day | ORAL | Status: DC

## 2022-01-05 NOTE — Progress Notes (Signed)
PROGRESS NOTE   [name] [name]  OIN:[id number] DOB: February 12, 1966 DOA: 01/03/2022 PCP: Sharilyn Sites, MD   Chief Complaint  Patient presents with   Chest Pain   Level of care: Telemetry Cardiac  Brief Admission History:  57 y.o. male, with past medical history of hypertension, hyperlipidemia, diabetes mellitus, morbid obesity, hypothyroidism, hepatic steatosis, depression. 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   Assessment and Plan:  Chest pain with Dyspnea  -Chest pain persists and after reassuring echo patient discussed with cardiology about transfer to [facility] for coronary CT study.   Pt has multiple risk factors including diabetes mellitus, hyperlipidemia and hypertension, morbid obesity, will be transferred to [facility] for further work-up. -Cardiology input greatly appreciated.   -Continue with his home dose aspirin, losartan and simvastatin. -he was started on low-dose Coreg as blood pressure appears elevated on admission  -Patient work as a Administrator,  D-dimers was reassuring.   Hyperlipedemia -We will check lipid panel, meanwhile we will continue with home dose simvastatin of 40 mg  oral daily Lipid Panel     Component Value Date/Time   CHOL 180 01/04/2022 0433   TRIG 333 (H) 01/04/2022 0433   HDL 33 (L) 01/04/2022 0433   CHOLHDL 5.5 01/04/2022 0433   VLDL 67 (H) 01/04/2022 0433   LDLCALC 80 01/04/2022 0433   Hypertension -Continue with losartan daily and carvedilol  -BPs are much better controlled    Diabetes mellitus, type 2 with vascular complications -uncontrolled as evidenced by an A1c>10% -We will hold home medication including Januvia, Prandin and metformin during hospital stay, will check A1c and keep an insulin sliding scale meanwhile CBG (last 3)  Recent Labs    01/04/22 2059 01/05/22 0330 01/05/22 0752  GLUCAP 223* 214* 271*   -added prandial coverage and novolog for meals with increased CBG monitoring  - Increased semglee to 30 units, increased prandial novolog to 10 units TID with meals eaten >50%  Chronic pain syndrome/depression -Continue with Cymbalta    Adrenal insufficiency  - continue with home dose hydrocortisone of 20 mg oral daily   Hypothyroidism -Continue with Synthroid   Hypokalemia - potassium  3.4, will replet    morbid obesity -lifestyle modification, BMI of 46.9   DVT Prophylaxis Heparin   AM Labs Ordered, also please review Full Orders  DVT prophylaxis: heparin Code Status: full  Family Communication: discussed with patient at bedside Disposition: Status is: Observation The patient remains OBS appropriate and will d/c before 2 midnights.   Consultants:  Cardiology  Procedures:  Echocardiogram: normal BiV function, no WMA Antimicrobials:  N/a  Subjective: Pt continues to have intermittent CP symptoms, still waiting on transportation to [facility] although he has had a bed  since  yesterday.  He is going for cardiac imaging studies not available at [facility].  RN to give him a NTG this morning.    Objective: Vitals:   01/04/22 1447 01/04/22 1600 01/04/22 1949 01/05/22 0324  BP: 132/75 136/81 (!) 147/83 (!) 143/85   Pulse: 78 80 80 68  Resp: '18 18 18 15  '$ Temp: 98.1 F (36.7 C)  97.9 F (36.6 C) 97.8 F (36.6 C)  TempSrc: Oral     SpO2: 94% 94% 93% 94%  Weight:      Height:        Intake/Output Summary (Last 24 hours) at 01/05/2022 0914 Last data filed at 01/05/2022 0500 Gross per 24 hour  Intake 840 ml  Output 2150 ml  Net -1310 ml   Filed Weights   01/03/22 1218  Weight: 136.1 kg   Examination:  General exam: Appears calm and comfortable  Respiratory system: Clear to auscultation. Respiratory effort normal. Cardiovascular system: normal S1 & S2 heard. No JVD, murmurs, rubs, gallops or clicks. No pedal edema. Gastrointestinal system: Abdomen is nondistended, soft and nontender. No organomegaly or masses felt. Normal bowel sounds heard. Central nervous system: Alert and oriented. No focal neurological deficits. Extremities: Symmetric 5 x 5 power. Skin: No rashes, lesions or ulcers. Psychiatry: Judgement and insight appear normal. Mood & affect appropriate.   Data Reviewed: I have personally reviewed following labs and imaging studies  CBC: Recent Labs  Lab 01/03/22 1245 01/03/22 1902  WBC 10.0 11.2*  NEUTROABS 6.7  --   HGB 13.8 13.8  HCT 42.3 42.3  MCV 81.0 80.6  PLT 492* 481*    Basic Metabolic Panel: Recent Labs  Lab 01/03/22 1245 01/03/22 1902  NA 135  --   K 3.4*  --   CL 104  --   CO2 22  --   GLUCOSE 310*  --   BUN 14  --   CREATININE 0.70 0.66  CALCIUM 8.6*  --     CBG: Recent Labs  Lab 01/04/22 1126 01/04/22 1613 01/04/22 2059 01/05/22 0330 01/05/22 0752  GLUCAP 272* 251* 223* 214* 271*    Recent Results (from the past 240 hour(s))  Resp Panel by RT-PCR (Flu A&B, Covid) Anterior Nasal Swab     Status: None   Collection Time: 01/03/22  1:05 PM   Specimen: Anterior Nasal Swab  Result Value Ref Range Status   SARS Coronavirus 2 by RT PCR NEGATIVE NEGATIVE Final    Comment: (NOTE) SARS-CoV-2 target nucleic acids are NOT DETECTED.  The  SARS-CoV-2 RNA is generally detectable in upper respiratory specimens during the acute phase of infection. The lowest concentration of SARS-CoV-2 viral copies this assay can detect is 138 copies/mL. A negative result does not preclude SARS-Cov-2 infection and should not be used as the sole basis for treatment or other patient management decisions. A negative result may occur with  improper specimen collection/handling, submission of specimen other than nasopharyngeal swab, presence of viral mutation(s) within the areas targeted by this assay, and inadequate number of viral copies(<138 copies/mL). A negative result must be combined with clinical observations, patient history, and epidemiological information. The expected result is Negative.  Fact Sheet for Patients:  EntrepreneurPulse.com.au  Fact Sheet for Healthcare Providers:  IncredibleEmployment.be  This test is no t yet approved or cleared by the [place] FDA and  has been authorized for detection and/or diagnosis of SARS-CoV-2 by FDA under an Emergency Use Authorization (EUA). This EUA will remain  in effect (meaning  this test can be used) for the duration of the COVID-19 declaration under Section 564(b)(1) of the Act, 21 U.S.C.section 360bbb-3(b)(1), unless the authorization is terminated  or revoked sooner.       Influenza A by PCR NEGATIVE NEGATIVE Final   Influenza B by PCR NEGATIVE NEGATIVE Final    Comment: (NOTE) The Xpert Xpress SARS-CoV-2/FLU/RSV plus assay is intended as an aid in the diagnosis of influenza from Nasopharyngeal swab specimens and should not be used as a sole basis for treatment. Nasal washings and aspirates are unacceptable for Xpert Xpress SARS-CoV-2/FLU/RSV testing.  Fact Sheet for Patients: EntrepreneurPulse.com.au  Fact Sheet for Healthcare Providers: IncredibleEmployment.be  This test is not yet approved or  cleared by the [place] FDA and has been authorized for detection and/or diagnosis of SARS-CoV-2 by FDA under an Emergency Use Authorization (EUA). This EUA will remain in effect (meaning this test can be used) for the duration of the COVID-19 declaration under Section 564(b)(1) of the Act, 21 U.S.C. section 360bbb-3(b)(1), unless the authorization is terminated or revoked.  Performed at [facility], [address], [place], [place] [address]      Radiology Studies: ECHOCARDIOGRAM COMPLETE  Result Date: 01/04/2022    ECHOCARDIOGRAM REPORT   Patient Name:   [name] [name] Esty Date of Exam: 01/04/2022 Medical Rec #:  [id number]    Height:       67.0 in Accession #:    [id number]   Weight:       300.0 lb Date of Birth:  March 15, 1966    BSA:          2.403 m Patient Age:    66 years     BP:           132/67 mmHg Patient Gender: M            HR:           80 bpm. Exam Location:  [facility] Procedure: 2D Echo, Cardiac Doppler, Color Doppler and Intracardiac            Opacification Agent Indications:    Chest Pain  History:        Patient has prior history of Echocardiogram examinations, most                 recent 12/17/2012. Arrythmias:RBBB, Signs/Symptoms:Chest Pain;                 Risk Factors:Hypertension and Diabetes.  Sonographer:    Wenda Low Referring Phys: [id number] DAWOOD Graciela Husbands  Sonographer Comments: Patient is morbidly obese. IMPRESSIONS  1. Left ventricular ejection fraction, by estimation, is 55 to 60%. The left ventricle has normal function. The left ventricle has no regional wall motion abnormalities. There is mild concentric left ventricular hypertrophy. Left ventricular diastolic parameters are consistent with Grade I diastolic dysfunction (impaired relaxation).  2. Right ventricular systolic function is normal. The right ventricular size is normal. Tricuspid regurgitation signal is inadequate for assessing PA pressure.  3. The mitral valve is normal in structure. Trivial mitral valve  regurgitation.  4. The aortic valve is tricuspid. Aortic valve regurgitation is not visualized. Aortic valve sclerosis is present, with no evidence of aortic valve stenosis.  5. Aortic dilatation noted. There is borderline dilatation of the aortic root, measuring 38 mm.  6. The inferior vena cava is normal in size with greater than 50% respiratory variability, suggesting right atrial pressure of 3 mmHg. Comparison(s): Compared to prior TTE report in 2014, there is no  significant change. FINDINGS  Left Ventricle: Left ventricular ejection fraction, by estimation, is 55 to 60%. The left ventricle has normal function. The left ventricle has no regional wall motion abnormalities. Definity contrast agent was given IV to delineate the left ventricular  endocardial borders. The left ventricular internal cavity size was normal in size. There is mild concentric left ventricular hypertrophy. Left ventricular diastolic parameters are consistent with Grade I diastolic dysfunction (impaired relaxation). Right Ventricle: The right ventricular size is normal. No increase in right ventricular wall thickness. Right ventricular systolic function is normal. Tricuspid regurgitation signal is inadequate for assessing PA pressure. Left Atrium: Left atrial size was normal in size. Right Atrium: Right atrial size was normal in size. Pericardium: There is no evidence of pericardial effusion. Mitral Valve: The mitral valve is normal in structure. Mild mitral annular calcification. Trivial mitral valve regurgitation. MV peak gradient, 4.5 mmHg. The mean mitral valve gradient is 2.0 mmHg. Tricuspid Valve: The tricuspid valve is normal in structure. Tricuspid valve regurgitation is trivial. Aortic Valve: The aortic valve is tricuspid. Aortic valve regurgitation is not visualized. Aortic valve sclerosis is present, with no evidence of aortic valve stenosis. Aortic valve mean gradient measures 4.0 mmHg. Aortic valve peak gradient measures 8.2   mmHg. Aortic valve area, by VTI measures 2.13 cm. Pulmonic Valve: The pulmonic valve was grossly normal. Pulmonic valve regurgitation is not visualized. Aorta: Aortic dilatation noted. There is borderline dilatation of the aortic root, measuring 38 mm. Venous: The inferior vena cava is normal in size with greater than 50% respiratory variability, suggesting right atrial pressure of 3 mmHg. IAS/Shunts: The atrial septum is grossly normal.  LEFT VENTRICLE PLAX 2D LVIDd:         6.00 cm     Diastology LVIDs:         3.50 cm     LV e' medial:    5.87 cm/s LV PW:         1.10 cm     LV E/e' medial:  14.4 LV IVS:        1.30 cm     LV e' lateral:   10.10 cm/s LVOT diam:     2.00 cm     LV E/e' lateral: 8.4 LV SV:         61 LV SV Index:   25 LVOT Area:     3.14 cm  LV Volumes (MOD) LV vol d, MOD A2C: 54.7 ml LV vol d, MOD A4C: 79.9 ml LV vol s, MOD A2C: 23.0 ml LV vol s, MOD A4C: 30.9 ml LV SV MOD A2C:     31.7 ml LV SV MOD A4C:     79.9 ml LV SV MOD BP:      39.6 ml RIGHT VENTRICLE RV Basal diam:  3.55 cm RV Mid diam:    2.60 cm TAPSE (M-mode): 3.1 cm LEFT ATRIUM             Index        RIGHT ATRIUM           Index LA diam:        4.30 cm 1.79 cm/m   RA Area:     19.80 cm LA Vol (A2C):   64.0 ml 26.64 ml/m  RA Volume:   55.50 ml  23.10 ml/m LA Vol (A4C):   66.6 ml 27.72 ml/m LA Biplane Vol: 66.3 ml 27.60 ml/m  AORTIC VALVE  PULMONIC VALVE AV Area (Vmax):    2.33 cm     PV Vmax:       0.82 m/s AV Area (Vmean):   2.13 cm     PV Peak grad:  2.7 mmHg AV Area (VTI):     2.13 cm AV Vmax:           143.00 cm/s AV Vmean:          98.700 cm/s AV VTI:            0.288 m AV Peak Grad:      8.2 mmHg AV Mean Grad:      4.0 mmHg LVOT Vmax:         106.00 cm/s LVOT Vmean:        66.900 cm/s LVOT VTI:          0.195 m LVOT/AV VTI ratio: 0.68  AORTA Ao Root diam: 3.70 cm Ao Asc diam:  3.60 cm MITRAL VALVE MV Area (PHT): 3.81 cm     SHUNTS MV Area VTI:   2.31 cm     Systemic VTI:  0.20 m MV Peak grad:  4.5  mmHg     Systemic Diam: 2.00 cm MV Mean grad:  2.0 mmHg MV Vmax:       1.06 m/s MV Vmean:      75.3 cm/s MV Decel Time: 199 msec MV E velocity: 84.80 cm/s MV A velocity: 101.00 cm/s MV E/A ratio:  0.84 Gwyndolyn Kaufman MD Electronically signed by Gwyndolyn Kaufman MD Signature Date/Time: 01/04/2022/11:02:52 AM    Final    DG Chest 2 View  Result Date: 01/03/2022 CLINICAL DATA:  Short of breath and chest pain EXAM: CHEST - 2 VIEW COMPARISON:  Chest 12/13/2021 in FINDINGS: The heart size and mediastinal contours are within normal limits. Both lungs are clear. The visualized skeletal structures are unremarkable. IMPRESSION: No active cardiopulmonary disease. Electronically Signed   By: Franchot Gallo M.D.   On: 01/03/2022 13:28    Scheduled Meds:  amLODipine  10 mg Oral Daily   aspirin EC  81 mg Oral QPM   carvedilol  3.125 mg Oral BID WC   cholecalciferol  1,000 Units Oral Daily   DULoxetine  120 mg Oral QHS   heparin  5,000 Units Subcutaneous Q8H   hydrocortisone  20 mg Oral Q breakfast   insulin aspart  0-20 Units Subcutaneous TID WC   insulin aspart  0-5 Units Subcutaneous QHS   insulin aspart  6 Units Subcutaneous TID WC   insulin glargine-yfgn  20 Units Subcutaneous Daily   levothyroxine  150 mcg Oral Q0600   losartan  100 mg Oral Daily   metoprolol tartrate  100 mg Oral Once   multivitamin with minerals  1 tablet Oral Daily   omega-3 acid ethyl esters  1 g Oral BID   pantoprazole  40 mg Oral Daily   simvastatin  40 mg Oral QHS   tamsulosin  0.4 mg Oral q AM   Continuous Infusions:   LOS: 0 days   Time spent: 35 mins  Silas Muff Wynetta Emery, MD How to contact the [facility] Attending or Consulting provider [place] or covering provider during after hours [place], for this patient?  Check the care team in [facility] and look for a) attending/consulting [facility] provider listed and b) the [facility] team listed Log into [email or web address] and use [place]'s universal password to access. If you do not have the  password, please contact the hospital operator. Locate  the [facility] provider you are looking for under [facility] and page to a number that you can be directly reached. If you still have difficulty reaching the provider, please page the [facility] (Director on Call) for the Hospitalists listed on amion for assistance.  01/05/2022, 9:14 AM

## 2022-01-05 NOTE — Progress Notes (Signed)
Patient stated he felt like a cramp/spasm like pain in the apical region in the chest. And felt it more when he moved positions. 6/10 pain scale. He said it usually comes like this and goes away with time. Gave nitro per MD x1, and had relief afterwards.

## 2022-01-05 NOTE — Discharge Instructions (Signed)

## 2022-01-05 NOTE — Progress Notes (Signed)
Pt resting comfortably in bed with Cpap on at this time. No c/o pain or discomfort. Pt waiting on [facility] to transport. Pt is aware. Will continue to monitor.

## 2022-01-05 NOTE — Progress Notes (Signed)
Cardiology Progress Note  Patient ID: [name] [name] MRN: [id number] DOB: [birth year] Date of Encounter: 01/05/2022  Primary Cardiologist: None  Subjective   Chief Complaint: CP  HPI: Continues to describe sharp discomfort in his chest.  He does have palpable chest pain as well.  He tells me he has 2 different kinds of pain.  Troponins negative.  Waiting for coronary CTA.  ROS:  All other ROS reviewed and negative. Pertinent positives noted in the HPI.     Inpatient Medications  Scheduled Meds:  amLODipine  10 mg Oral Daily   aspirin EC  81 mg Oral QPM   carvedilol  3.125 mg Oral BID WC   cholecalciferol  1,000 Units Oral Daily   DULoxetine  120 mg Oral QHS   heparin  5,000 Units Subcutaneous Q8H   hydrocortisone  20 mg Oral Q breakfast   insulin aspart  0-20 Units Subcutaneous TID WC   insulin aspart  0-5 Units Subcutaneous QHS   insulin aspart  6 Units Subcutaneous TID WC   insulin glargine-yfgn  20 Units Subcutaneous Daily   levothyroxine  150 mcg Oral Q0600   losartan  100 mg Oral Daily   metoprolol tartrate  100 mg Oral On Call   multivitamin with minerals  1 tablet Oral Daily   omega-3 acid ethyl esters  1 g Oral BID   pantoprazole  40 mg Oral Daily   simvastatin  40 mg Oral QHS   tamsulosin  0.4 mg Oral q AM   Continuous Infusions:  PRN Meds: acetaminophen, nitroGLYCERIN, ondansetron (ZOFRAN) IV   Vital Signs   Vitals:   01/04/22 1447 01/04/22 1600 01/04/22 1949 01/05/22 0324  BP: 132/75 136/81 (!) 147/83 (!) 143/85  Pulse: 78 80 80 68  Resp: '18 18 18 15  '$ Temp: 98.1 F (36.7 C)  97.9 F (36.6 C) 97.8 F (36.6 C)  TempSrc: Oral     SpO2: 94% 94% 93% 94%  Weight:      Height:        Intake/Output Summary (Last 24 hours) at 01/05/2022 0906 Last data filed at 01/05/2022 0500 Gross per 24 hour  Intake 840 ml  Output 2150 ml  Net -1310 ml      01/03/2022   12:18 PM 11/28/2021    2:22 PM 08/13/2021   12:37 PM  Last 3 Weights  Weight (lbs) 300 lb 294 lb 9.6  oz 295 lb  Weight (kg) 136.079 kg 133.63 kg 133.811 kg      Telemetry  Overnight telemetry shows SR 70s, which I personally reviewed.   ECG  The most recent ECG shows sinus rhythm heart rate 96, nonspecific ST-T changes, which I personally reviewed.   Physical Exam   Vitals:   01/04/22 1447 01/04/22 1600 01/04/22 1949 01/05/22 0324  BP: 132/75 136/81 (!) 147/83 (!) 143/85  Pulse: 78 80 80 68  Resp: '18 18 18 15  '$ Temp: 98.1 F (36.7 C)  97.9 F (36.6 C) 97.8 F (36.6 C)  TempSrc: Oral     SpO2: 94% 94% 93% 94%  Weight:      Height:        Intake/Output Summary (Last 24 hours) at 01/05/2022 0906 Last data filed at 01/05/2022 0500 Gross per 24 hour  Intake 840 ml  Output 2150 ml  Net -1310 ml       01/03/2022   12:18 PM 11/28/2021    2:22 PM 08/13/2021   12:37 PM  Last 3 Weights  Weight (lbs)  300 lb 294 lb 9.6 oz 295 lb  Weight (kg) 136.079 kg 133.63 kg 133.811 kg    Body mass index is 46.99 kg/m.  General: Well nourished, well developed, in no acute distress Head: Atraumatic, normal size  Eyes: PEERLA, EOMI  Neck: Supple, no JVD Endocrine: No thryomegaly Cardiac: Normal S1, S2; RRR; no murmurs, rubs, or gallops Lungs: Clear to auscultation bilaterally, no wheezing, rhonchi or rales  Abd: Soft, nontender, no hepatomegaly  Ext: No edema, pulses 2+ Musculoskeletal: No deformities, BUE and BLE strength normal and equal Skin: Warm and dry, no rashes   Neuro: Alert and oriented to person, place, time, and situation, CNII-XII grossly intact, no focal deficits  Psych: Normal mood and affect   Labs  High Sensitivity Troponin:   Recent Labs  Lab 01/03/22 1245 01/03/22 1439  TROPONINIHS 6 6     Cardiac EnzymesNo results for input(s): "TROPONINI" in the last 168 hours. No results for input(s): "TROPIPOC" in the last 168 hours.  Chemistry Recent Labs  Lab 01/03/22 1245 01/03/22 1902  NA 135  --   K 3.4*  --   CL 104  --   CO2 22  --   GLUCOSE 310*  --   BUN 14  --    CREATININE 0.70 0.66  CALCIUM 8.6*  --   GFRNONAA >60 >60  ANIONGAP 9  --     Hematology Recent Labs  Lab 01/03/22 1245 01/03/22 1902  WBC 10.0 11.2*  RBC 5.22 5.25  HGB 13.8 13.8  HCT 42.3 42.3  MCV 81.0 80.6  MCH 26.4 26.3  MCHC 32.6 32.6  RDW 17.7* 17.3*  PLT 492* 481*   BNP Recent Labs  Lab 01/03/22 1245  BNP 15.0    DDimer  Recent Labs  Lab 01/03/22 1439  DDIMER 0.48     Radiology  ECHOCARDIOGRAM COMPLETE  Result Date: 01/04/2022    ECHOCARDIOGRAM REPORT   Patient Name:   [name] [name] [name] [name] Date of Exam: 01/04/2022 Medical Rec #:  [id number]    Height:       67.0 in Accession #:    [id number]   Weight:       300.0 lb Date of Birth:  1965/06/15    BSA:          2.403 m Patient Age:    56 years     BP:           132/67 mmHg Patient Gender: M            HR:           80 bpm. Exam Location:  [facility] Procedure: 2D Echo, Cardiac Doppler, Color Doppler and Intracardiac            Opacification Agent Indications:    Chest Pain  History:        Patient has prior history of Echocardiogram examinations, most                 recent 12/17/2012. Arrythmias:RBBB, Signs/Symptoms:Chest Pain;                 Risk Factors:Hypertension and Diabetes.  Sonographer:    Wenda Low Referring Phys: [id number] DAWOOD Graciela Husbands  Sonographer Comments: Patient is morbidly obese. IMPRESSIONS  1. Left ventricular ejection fraction, by estimation, is 55 to 60%. The left ventricle has normal function. The left ventricle has no regional wall motion abnormalities. There is mild concentric left ventricular hypertrophy. Left ventricular diastolic parameters are consistent with Grade  I diastolic dysfunction (impaired relaxation).  2. Right ventricular systolic function is normal. The right ventricular size is normal. Tricuspid regurgitation signal is inadequate for assessing PA pressure.  3. The mitral valve is normal in structure. Trivial mitral valve regurgitation.  4. The aortic valve is tricuspid. Aortic valve  regurgitation is not visualized. Aortic valve sclerosis is present, with no evidence of aortic valve stenosis.  5. Aortic dilatation noted. There is borderline dilatation of the aortic root, measuring 38 mm.  6. The inferior vena cava is normal in size with greater than 50% respiratory variability, suggesting right atrial pressure of 3 mmHg. Comparison(s): Compared to prior TTE report in 2014, there is no significant change. FINDINGS  Left Ventricle: Left ventricular ejection fraction, by estimation, is 55 to 60%. The left ventricle has normal function. The left ventricle has no regional wall motion abnormalities. Definity contrast agent was given IV to delineate the left ventricular  endocardial borders. The left ventricular internal cavity size was normal in size. There is mild concentric left ventricular hypertrophy. Left ventricular diastolic parameters are consistent with Grade I diastolic dysfunction (impaired relaxation). Right Ventricle: The right ventricular size is normal. No increase in right ventricular wall thickness. Right ventricular systolic function is normal. Tricuspid regurgitation signal is inadequate for assessing PA pressure. Left Atrium: Left atrial size was normal in size. Right Atrium: Right atrial size was normal in size. Pericardium: There is no evidence of pericardial effusion. Mitral Valve: The mitral valve is normal in structure. Mild mitral annular calcification. Trivial mitral valve regurgitation. MV peak gradient, 4.5 mmHg. The mean mitral valve gradient is 2.0 mmHg. Tricuspid Valve: The tricuspid valve is normal in structure. Tricuspid valve regurgitation is trivial. Aortic Valve: The aortic valve is tricuspid. Aortic valve regurgitation is not visualized. Aortic valve sclerosis is present, with no evidence of aortic valve stenosis. Aortic valve mean gradient measures 4.0 mmHg. Aortic valve peak gradient measures 8.2  mmHg. Aortic valve area, by VTI measures 2.13 cm. Pulmonic  Valve: The pulmonic valve was grossly normal. Pulmonic valve regurgitation is not visualized. Aorta: Aortic dilatation noted. There is borderline dilatation of the aortic root, measuring 38 mm. Venous: The inferior vena cava is normal in size with greater than 50% respiratory variability, suggesting right atrial pressure of 3 mmHg. IAS/Shunts: The atrial septum is grossly normal.  LEFT VENTRICLE PLAX 2D LVIDd:         6.00 cm     Diastology LVIDs:         3.50 cm     LV e' medial:    5.87 cm/s LV PW:         1.10 cm     LV E/e' medial:  14.4 LV IVS:        1.30 cm     LV e' lateral:   10.10 cm/s LVOT diam:     2.00 cm     LV E/e' lateral: 8.4 LV SV:         61 LV SV Index:   25 LVOT Area:     3.14 cm  LV Volumes (MOD) LV vol d, MOD A2C: 54.7 ml LV vol d, MOD A4C: 79.9 ml LV vol s, MOD A2C: 23.0 ml LV vol s, MOD A4C: 30.9 ml LV SV MOD A2C:     31.7 ml LV SV MOD A4C:     79.9 ml LV SV MOD BP:      39.6 ml RIGHT VENTRICLE RV Basal diam:  3.55 cm RV Mid  diam:    2.60 cm TAPSE (M-mode): 3.1 cm LEFT ATRIUM             Index        RIGHT ATRIUM           Index LA diam:        4.30 cm 1.79 cm/m   RA Area:     19.80 cm LA Vol (A2C):   64.0 ml 26.64 ml/m  RA Volume:   55.50 ml  23.10 ml/m LA Vol (A4C):   66.6 ml 27.72 ml/m LA Biplane Vol: 66.3 ml 27.60 ml/m  AORTIC VALVE                    PULMONIC VALVE AV Area (Vmax):    2.33 cm     PV Vmax:       0.82 m/s AV Area (Vmean):   2.13 cm     PV Peak grad:  2.7 mmHg AV Area (VTI):     2.13 cm AV Vmax:           143.00 cm/s AV Vmean:          98.700 cm/s AV VTI:            0.288 m AV Peak Grad:      8.2 mmHg AV Mean Grad:      4.0 mmHg LVOT Vmax:         106.00 cm/s LVOT Vmean:        66.900 cm/s LVOT VTI:          0.195 m LVOT/AV VTI ratio: 0.68  AORTA Ao Root diam: 3.70 cm Ao Asc diam:  3.60 cm MITRAL VALVE MV Area (PHT): 3.81 cm     SHUNTS MV Area VTI:   2.31 cm     Systemic VTI:  0.20 m MV Peak grad:  4.5 mmHg     Systemic Diam: 2.00 cm MV Mean grad:  2.0 mmHg MV  Vmax:       1.06 m/s MV Vmean:      75.3 cm/s MV Decel Time: 199 msec MV E velocity: 84.80 cm/s MV A velocity: 101.00 cm/s MV E/A ratio:  0.84 [name] Kaufman MD Electronically signed by [name] Kaufman MD Signature Date/Time: 01/04/2022/11:02:52 AM    Final    DG Chest 2 View  Result Date: 01/03/2022 CLINICAL DATA:  Short of breath and chest pain EXAM: CHEST - 2 VIEW COMPARISON:  Chest 12/13/2021 in FINDINGS: The heart size and mediastinal contours are within normal limits. Both lungs are clear. The visualized skeletal structures are unremarkable. IMPRESSION: No active cardiopulmonary disease. Electronically Signed   By: [name] [name] M.D.   On: 01/03/2022 13:28    Cardiac Studies  TTE 01/04/2022  1. Left ventricular ejection fraction, by estimation, is 55 to 60%. The  left ventricle has normal function. The left ventricle has no regional  wall motion abnormalities. There is mild concentric left ventricular  hypertrophy. Left ventricular diastolic  parameters are consistent with Grade I diastolic dysfunction (impaired  relaxation).   2. Right ventricular systolic function is normal. The right ventricular  size is normal. Tricuspid regurgitation signal is inadequate for assessing  PA pressure.   3. The mitral valve is normal in structure. Trivial mitral valve  regurgitation.   4. The aortic valve is tricuspid. Aortic valve regurgitation is not  visualized. Aortic valve sclerosis is present, with no evidence of aortic  valve stenosis.   5. Aortic dilatation noted.  There is borderline dilatation of the aortic  root, measuring 38 mm.   6. The inferior vena cava is normal in size with greater than 50%  respiratory variability, suggesting right atrial pressure of 3 mmHg.   Patient Profile  [name] [name] is a 56 y.o. male with hypertension, obesity, diabetes, OSA who was admitted on 01/03/2022 for chest pain.  Assessment & Plan   #Chest pain -Possibly cardiac.  EKG shows nonspecific ST-T  changes.  Troponins are negative x2.  Echo was normal. -He is awaiting bed placement at [facility] for coronary CTA.  He does not appear he will get a bed today.  We will therefore proceed with transfer to [facility] for coronary CTA and he will come back to [facility].  I reviewed his CT abdomen pelvis dated 12/10/2021.  He does have evidence of minimal coronary calcifications.  I think he has a low likelihood of having significant obstructive CAD.  Hopefully we can discharge him later today pending the results of his coronary CTA. -He will get 100 mg of metoprolol tartrate at 1030.  Scan is at 1230 today. -Continue aspirin and statin.  For questions or updates, please contact [place] Please consult [email or web address] for contact info under     Signed, [place] T. Audie Box, MD, [place]  01/05/2022 9:06 AM

## 2022-01-05 NOTE — Discharge Summary (Signed)
Physician Discharge Summary  [name] [name] QHU:[id number] DOB: [birth year] DOA: 01/03/2022  PCP: Sharilyn Sites, MD  Admit date: 01/03/2022 Discharge date: 01/05/2022  Admitted From:  Home  Disposition: Home   Recommendations for Outpatient Follow-up:  Follow up with PCP in 1 weeks Please follow up with cardiology on 9/6 as scheduled Please follow up with endocrinologist in 2 weeks Please test blood glucose at least 4-5 times per day  Discharge Condition: STABLE   CODE STATUS: FULL  DIET: resume heart health/carb modified    Brief Hospitalization Summary: Please see all hospital notes, images, labs for full details of the hospitalization. ADMISSION HPI:  56 y.o. male, with past medical history of hypertension, hyperlipidemia, diabetes mellitus, morbid obesity, hypothyroidism, hepatic steatosis, depression.  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   Hospital Course by problem   Chest pain with Dyspnea   -Chest pain persists and after reassuring echo patient discussed with cardiology about transfer to [facility] for coronary CT study.   Pt has multiple risk factors including diabetes mellitus,  hyperlipidemia and hypertension, morbid obesity, will be transferred to [facility] for further work-up. -Cardiology input greatly appreciated.   -Continue with his home dose aspirin, losartan and simvastatin. -he was started on low-dose Coreg as blood pressure appears elevated on admission  -Patient work as a Administrator,  D-dimers was reassuring. -Pt was seen by cardiology and they sent him to [facility] for a cardiac CT study and the study showed nonobstructive CAD which was reassuring and was felt that he could safely discharge home with outpatient follow up.  He has appt with Dr. Domenic Polite on 9/6 to have cardiology follow up.   -His CP symptoms thought to be GI related and we have increased omeprazole to 40 mg BID and have him follow up with Dr. Gala Romney his GI provider.  Pt advised to seek medical care or return if symptoms worsen or new problem develops and he verbalized understanding.    Hyperlipedemia -We will check lipid panel, meanwhile we will continue with home dose simvastatin of 40 mg oral daily Lipid Panel  Labs (Brief)          Component Value Date/Time    CHOL 180 01/04/2022 0433    TRIG 333 (H) 01/04/2022 0433    HDL 33 (L) 01/04/2022 0433    CHOLHDL 5.5 01/04/2022 0433    VLDL 67 (H) 01/04/2022 0433    LDLCALC 80 01/04/2022 0433      Hypertension -Continue with losartan daily and carvedilol  -BPs are much better controlled    Diabetes mellitus, type 2 with vascular complications -uncontrolled as evidenced by an A1c>10% -Resume home medications and add basal insulin levemir 15 units every evening  and have patient test blood glucose 5 times per day and follow up with his endocrinologist in 1-2 weeks.   CBG (last 3)  Recent Labs (last 2 labs)        Recent Labs    01/04/22 2059 01/05/22 0330 01/05/22 0752  GLUCAP 223* 214* 271*      Chronic pain syndrome/depression -Continue with Cymbalta    Adrenal insufficiency  - continue with home dose hydrocortisone of 20 mg oral  daily   Hypothyroidism -Continue with Synthroid   Hypokalemia - repleted     morbid obesity -lifestyle modification, BMI of 46.9   DVT Prophylaxis Heparin   Discharge Diagnoses:  Principal Problem:   Chest pain Active Problems:   GERD   OSA on CPAP   Hypothyroidism   Uncontrolled type 2 diabetes mellitus with hyperglycemia (HCC)   Obesity, Class III, BMI 40-49.9 (morbid obesity) ([place])   Essential hypertension   Discharge Instructions:  Allergies as of 01/05/2022       Reactions   Dilaudid [hydromorphone] Other (See Comments)   "It makes me go crazy." Per patient    Nitroglycerin Other (See Comments)   Patient states "it makes me go crazy."        Medication List     TAKE these medications    amLODipine 10 MG tablet Commonly known as: NORVASC Take 1 tablet (10 mg total) by mouth daily. What changed:  medication strength how much to take   aspirin EC 81 MG tablet Take 81 mg by mouth every evening.   blood glucose meter kit and supplies Dispense based on patient and insurance preference. Use up to four times daily as directed. (FOR ICD-10 E10.9, E11.9).   carvedilol 3.125 MG tablet Commonly known as: COREG Take 1 tablet (3.125 mg total) by mouth 2 (two) times daily with a meal.   cholecalciferol 25 MCG (1000 UNIT) tablet Commonly known as: VITAMIN D3 Take 1,000 Units by mouth daily.   DULoxetine 60 MG capsule Commonly known as: CYMBALTA Take 120 mg by mouth at bedtime.   glimepiride 4 MG tablet Commonly known as: AMARYL Take 4 mg by mouth 2 (two) times daily.   hydrocortisone 20 MG tablet Commonly known as: CORTEF Take 1 tablet (20 mg total) by mouth daily. For cortisol replacement   insulin detemir 100 UNIT/ML injection Commonly known as: Levemir Inject 0.15 mLs (15 Units total) into the skin at bedtime.   INSULIN SYRINGE .5CC/31GX5/16" 31G X 5/16" 0.5 ML Misc 1 Syringe by Does not apply route as directed.   levothyroxine 150 MCG  tablet Commonly known as: SYNTHROID Take 150 mcg by mouth every morning.   losartan 100 MG tablet Commonly known as: COZAAR Take 100 mg by mouth daily.   Mens Multivitamin Plus Tabs Take 1 tablet by mouth daily. /For nutritional supplementation.   metFORMIN 1000 MG tablet Commonly known as: GLUCOPHAGE Take 1,000 mg by mouth 2 (two) times daily.   OMEGA-3 FISH OIL PO Take 1 capsule by mouth at bedtime.   omeprazole 40 MG capsule Commonly known as: PRILOSEC Take 1 capsule (40 mg total) by mouth 2 (two) times daily. What changed: when to take this   simvastatin 40 MG tablet Commonly known as: ZOCOR Take 40 mg by mouth at bedtime.   sitaGLIPtin 100 MG tablet Commonly known as: JANUVIA Take 100 mg by mouth at bedtime.   tamsulosin 0.4 MG Caps capsule Commonly known as: FLOMAX Take 0.4 mg by mouth in the morning.  Follow-up Information     Sharilyn Sites, MD. Schedule an appointment as soon as possible for a visit in 1 week(s).   Specialty: Family Medicine Why: Hospital Follow Up Contact information: [address] [place] [address] [phone]         [employer]. Schedule an appointment as soon as possible for a visit in 1 week(s).   Why: Hospital Follow Up Contact information: [place]        Jacelyn Pi, MD. Schedule an appointment as soon as possible for a visit in 2 week(s).   Specialty: Endocrinology Why: Hospital Follow Up Contact information: [address] [place] [address] [phone]         Satira Sark, MD. Go on 02/07/2022.   Specialty: Cardiology Why: Hospital Follow Up Contact information: [place] [address] [phone]                Allergies  Allergen Reactions   Dilaudid [Hydromorphone] Other (See Comments)    "It makes me go crazy." Per patient    Nitroglycerin  Other (See Comments)    Patient states "it makes me go crazy."   Allergies as of 01/05/2022       Reactions   Dilaudid [hydromorphone] Other (See Comments)   "It makes me go crazy." Per patient    Nitroglycerin Other (See Comments)   Patient states "it makes me go crazy."        Medication List     TAKE these medications    amLODipine 10 MG tablet Commonly known as: NORVASC Take 1 tablet (10 mg total) by mouth daily. What changed:  medication strength how much to take   aspirin EC 81 MG tablet Take 81 mg by mouth every evening.   blood glucose meter kit and supplies Dispense based on patient and insurance preference. Use up to four times daily as directed. (FOR ICD-10 E10.9, E11.9).   carvedilol 3.125 MG tablet Commonly known as: COREG Take 1 tablet (3.125 mg total) by mouth 2 (two) times daily with a meal.   cholecalciferol 25 MCG (1000 UNIT) tablet Commonly known as: VITAMIN D3 Take 1,000 Units by mouth daily.   DULoxetine 60 MG capsule Commonly known as: CYMBALTA Take 120 mg by mouth at bedtime.   glimepiride 4 MG tablet Commonly known as: AMARYL Take 4 mg by mouth 2 (two) times daily.   hydrocortisone 20 MG tablet Commonly known as: CORTEF Take 1 tablet (20 mg total) by mouth daily. For cortisol replacement   insulin detemir 100 UNIT/ML injection Commonly known as: Levemir Inject 0.15 mLs (15 Units total) into the skin at bedtime.   INSULIN SYRINGE .5CC/31GX5/16" 31G X 5/16" 0.5 ML Misc 1 Syringe by Does not apply route as directed.   levothyroxine 150 MCG tablet Commonly known as: SYNTHROID Take 150 mcg by mouth every morning.   losartan 100 MG tablet Commonly known as: COZAAR Take 100 mg by mouth daily.   Mens Multivitamin Plus Tabs Take 1 tablet by mouth daily. /For nutritional supplementation.   metFORMIN 1000 MG tablet Commonly known as: GLUCOPHAGE Take 1,000 mg by mouth 2 (two) times daily.   OMEGA-3 FISH OIL PO Take 1 capsule by  mouth at bedtime.   omeprazole 40 MG capsule Commonly known as: PRILOSEC Take 1 capsule (40 mg total) by mouth 2 (two) times daily. What changed: when to take this   simvastatin 40 MG tablet Commonly  known as: ZOCOR Take 40 mg by mouth at bedtime.   sitaGLIPtin 100 MG tablet Commonly known as: JANUVIA Take 100 mg by mouth at bedtime.   tamsulosin 0.4 MG Caps capsule Commonly known as: FLOMAX Take 0.4 mg by mouth in the morning.        Procedures/Studies: CT CORONARY MORPH W/CTA COR W/SCORE W/CA W/CM &/OR WO/CM  Result Date: 01/05/2022 CLINICAL DATA:  43M with chest pain EXAM: Cardiac/Coronary CTA TECHNIQUE: The patient was scanned on a [employer]. FINDINGS: A 100 kV prospective scan was triggered in the descending thoracic aorta at 111 HU's. Axial non-contrast 3 mm slices were carried out through the heart. The data set was analyzed on a dedicated work station and scored using the [place]. Gantry rotation speed was 250 msecs and collimation was .6 mm. No beta blockade and 0.8 mg of sl NTG was given. The 3D data set was reconstructed in 5% intervals of the 35-75% of the R-R cycle. Phases were analyzed on a dedicated work station using MPR, MIP and VRT modes. The patient received 80 cc of contrast. Coronary Arteries:  Normal coronary origin.  Right dominance. RCA is a large dominant artery that gives rise to PDA and PLA. Calcified plaque in proximal RCA causes 0-24% stenosis. Calcified plaque in mid RCA causes 0-24% stenosis. Calcified plaque in distal RCA causes 0-24% stenosis Left main is a large artery that gives rise to LAD and LCX arteries. LAD is a large vessel. Calcified plaque in proximal LAD causes 0-24% stenosis. Calcified plaque in mid LAD causes 0-24% stenosis. Calcified plaque in ostial D2 causes 0-24% stenosis LCX is a non-dominant artery that gives rise to one large OM1 branch. Calcified plaque in proximal LCX causes 0-24% stenosis. Calcified plaque in mid LCX  causes 0-24% stenosis Other findings: Left Ventricle: Normal size Left Atrium: Mild enlargement Pulmonary Veins: Normal configuration Right Ventricle: Mild dilatation Right Atrium: Mild enlargement Cardiac valves: No calcifications Thoracic aorta: Normal size Pulmonary Arteries: Normal size Systemic Veins: Normal drainage Pericardium: Normal thickness IMPRESSION: 1. Coronary calcium score of 144. This was 84th percentile for age and sex matched control. Unfortunately contrast was administered prior to initial calcium score. Calcium score was repeated 5 minutes later, but question the accuracy of calcium score given prior contrast administration 2. Poor quality study due to noise secondary to obesity 3. Normal coronary origin with right dominance. 4. Nonobstructive CAD, with calcified plaque causing minimal (0-24%) stenosis CAD-RADS 1. Minimal non-obstructive CAD (0-24%). Consider non-atherosclerotic causes of chest pain. Consider preventive therapy and risk factor modification. Electronically Signed   By: Oswaldo Milian M.D.   On: 01/05/2022 14:10   ECHOCARDIOGRAM COMPLETE  Result Date: 01/04/2022    ECHOCARDIOGRAM REPORT   Patient Name:   SHANNEN VERNON Moylan Date of Exam: 01/04/2022 Medical Rec #:  [id number]    Height:       67.0 in Accession #:    [id number]   Weight:       300.0 lb Date of Birth:  1965-12-30    BSA:          2.403 m Patient Age:    56 years     BP:           132/67 mmHg Patient Gender: M            HR:           80 bpm. Exam Location:  [facility] Procedure: 2D Echo, Cardiac Doppler, Color Doppler and Intracardiac  Opacification Agent Indications:    Chest Pain  History:        Patient has prior history of Echocardiogram examinations, most                 recent 12/17/2012. Arrythmias:RBBB, Signs/Symptoms:Chest Pain;                 Risk Factors:Hypertension and Diabetes.  Sonographer:    Wenda Low Referring Phys: [id number] DAWOOD Graciela Husbands  Sonographer Comments: Patient is morbidly  obese. IMPRESSIONS  1. Left ventricular ejection fraction, by estimation, is 55 to 60%. The left ventricle has normal function. The left ventricle has no regional wall motion abnormalities. There is mild concentric left ventricular hypertrophy. Left ventricular diastolic parameters are consistent with Grade I diastolic dysfunction (impaired relaxation).  2. Right ventricular systolic function is normal. The right ventricular size is normal. Tricuspid regurgitation signal is inadequate for assessing PA pressure.  3. The mitral valve is normal in structure. Trivial mitral valve regurgitation.  4. The aortic valve is tricuspid. Aortic valve regurgitation is not visualized. Aortic valve sclerosis is present, with no evidence of aortic valve stenosis.  5. Aortic dilatation noted. There is borderline dilatation of the aortic root, measuring 38 mm.  6. The inferior vena cava is normal in size with greater than 50% respiratory variability, suggesting right atrial pressure of 3 mmHg. Comparison(s): Compared to prior TTE report in 2014, there is no significant change. FINDINGS  Left Ventricle: Left ventricular ejection fraction, by estimation, is 55 to 60%. The left ventricle has normal function. The left ventricle has no regional wall motion abnormalities. Definity contrast agent was given IV to delineate the left ventricular  endocardial borders. The left ventricular internal cavity size was normal in size. There is mild concentric left ventricular hypertrophy. Left ventricular diastolic parameters are consistent with Grade I diastolic dysfunction (impaired relaxation). Right Ventricle: The right ventricular size is normal. No increase in right ventricular wall thickness. Right ventricular systolic function is normal. Tricuspid regurgitation signal is inadequate for assessing PA pressure. Left Atrium: Left atrial size was normal in size. Right Atrium: Right atrial size was normal in size. Pericardium: There is no evidence  of pericardial effusion. Mitral Valve: The mitral valve is normal in structure. Mild mitral annular calcification. Trivial mitral valve regurgitation. MV peak gradient, 4.5 mmHg. The mean mitral valve gradient is 2.0 mmHg. Tricuspid Valve: The tricuspid valve is normal in structure. Tricuspid valve regurgitation is trivial. Aortic Valve: The aortic valve is tricuspid. Aortic valve regurgitation is not visualized. Aortic valve sclerosis is present, with no evidence of aortic valve stenosis. Aortic valve mean gradient measures 4.0 mmHg. Aortic valve peak gradient measures 8.2  mmHg. Aortic valve area, by VTI measures 2.13 cm. Pulmonic Valve: The pulmonic valve was grossly normal. Pulmonic valve regurgitation is not visualized. Aorta: Aortic dilatation noted. There is borderline dilatation of the aortic root, measuring 38 mm. Venous: The inferior vena cava is normal in size with greater than 50% respiratory variability, suggesting right atrial pressure of 3 mmHg. IAS/Shunts: The atrial septum is grossly normal.  LEFT VENTRICLE PLAX 2D LVIDd:         6.00 cm     Diastology LVIDs:         3.50 cm     LV e' medial:    5.87 cm/s LV PW:         1.10 cm     LV E/e' medial:  14.4 LV IVS:  1.30 cm     LV e' lateral:   10.10 cm/s LVOT diam:     2.00 cm     LV E/e' lateral: 8.4 LV SV:         61 LV SV Index:   25 LVOT Area:     3.14 cm  LV Volumes (MOD) LV vol d, MOD A2C: 54.7 ml LV vol d, MOD A4C: 79.9 ml LV vol s, MOD A2C: 23.0 ml LV vol s, MOD A4C: 30.9 ml LV SV MOD A2C:     31.7 ml LV SV MOD A4C:     79.9 ml LV SV MOD BP:      39.6 ml RIGHT VENTRICLE RV Basal diam:  3.55 cm RV Mid diam:    2.60 cm TAPSE (M-mode): 3.1 cm LEFT ATRIUM             Index        RIGHT ATRIUM           Index LA diam:        4.30 cm 1.79 cm/m   RA Area:     19.80 cm LA Vol (A2C):   64.0 ml 26.64 ml/m  RA Volume:   55.50 ml  23.10 ml/m LA Vol (A4C):   66.6 ml 27.72 ml/m LA Biplane Vol: 66.3 ml 27.60 ml/m  AORTIC VALVE                     PULMONIC VALVE AV Area (Vmax):    2.33 cm     PV Vmax:       0.82 m/s AV Area (Vmean):   2.13 cm     PV Peak grad:  2.7 mmHg AV Area (VTI):     2.13 cm AV Vmax:           143.00 cm/s AV Vmean:          98.700 cm/s AV VTI:            0.288 m AV Peak Grad:      8.2 mmHg AV Mean Grad:      4.0 mmHg LVOT Vmax:         106.00 cm/s LVOT Vmean:        66.900 cm/s LVOT VTI:          0.195 m LVOT/AV VTI ratio: 0.68  AORTA Ao Root diam: 3.70 cm Ao Asc diam:  3.60 cm MITRAL VALVE MV Area (PHT): 3.81 cm     SHUNTS MV Area VTI:   2.31 cm     Systemic VTI:  0.20 m MV Peak grad:  4.5 mmHg     Systemic Diam: 2.00 cm MV Mean grad:  2.0 mmHg MV Vmax:       1.06 m/s MV Vmean:      75.3 cm/s MV Decel Time: 199 msec MV E velocity: 84.80 cm/s MV A velocity: 101.00 cm/s MV E/A ratio:  0.84 Gwyndolyn Kaufman MD Electronically signed by Gwyndolyn Kaufman MD Signature Date/Time: 01/04/2022/11:02:52 AM    Final    DG Chest 2 View  Result Date: 01/03/2022 CLINICAL DATA:  Short of breath and chest pain EXAM: CHEST - 2 VIEW COMPARISON:  Chest 12/13/2021 in FINDINGS: The heart size and mediastinal contours are within normal limits. Both lungs are clear. The visualized skeletal structures are unremarkable. IMPRESSION: No active cardiopulmonary disease. Electronically Signed   By: Franchot Gallo M.D.   On: 01/03/2022 13:28   DG Chest 2 View  Result  Date: 12/15/2021 CLINICAL DATA:  Follow-up LEFT UPPER lobe infiltrate seen 4 months ago. EXAM: CHEST - 2 VIEW COMPARISON:  08/13/2021 FINDINGS: Heart size is normal. Lungs are free of focal consolidations and pleural effusions. No pulmonary edema. Interval resolution of LEFT UPPER lobe infiltrate. IMPRESSION: No evidence for acute  abnormality. Electronically Signed   By: Nolon Nations M.D.   On: 12/15/2021 08:30   CT ABDOMEN PELVIS W CONTRAST  Result Date: 12/10/2021 CLINICAL DATA:  Nausea, vomiting, and diarrhea. EXAM: CT ABDOMEN AND PELVIS WITH CONTRAST TECHNIQUE: Multidetector CT  imaging of the abdomen and pelvis was performed using the standard protocol following bolus administration of intravenous contrast. RADIATION DOSE REDUCTION: This exam was performed according to the departmental dose-optimization program which includes automated exposure control, adjustment of the mA and/or kV according to patient size and/or use of iterative reconstruction technique. CONTRAST:  183m OMNIPAQUE IOHEXOL 300 MG/ML  SOLN COMPARISON:  10/24/2019. FINDINGS: Lower chest: A few scattered coronary artery calcifications are noted. Hepatobiliary: No focal liver abnormality is seen. Fatty infiltration of the liver is noted. The gallbladder is surgically absent. No biliary ductal dilatation. Pancreas: Unremarkable. No pancreatic ductal dilatation or surrounding inflammatory changes. Spleen: The spleen is surgically absent. A small residual splenule is noted in the left upper quadrant. Adrenals/Urinary Tract: No adrenal nodule or mass. A cyst is present in the mid left kidney. A nonobstructive calculus is noted in the upper pole the left kidney. No ureteral calculus or obstructive uropathy bilaterally. The bladder is unremarkable. Stomach/Bowel: Gastric wall thickening is noted. No bowel obstruction, free air, or pneumatosis. The appendix is normal. A few scattered diverticula are present along the colon without evidence of diverticulitis. Vascular/Lymphatic: Minimal aortic atherosclerosis. No abdominal or pelvic lymphadenopathy by size criteria. Reproductive: Prostate is unremarkable. Other: Fat containing inguinal hernias are noted bilaterally. No abdominopelvic ascites. Musculoskeletal: Mild degenerative changes are noted in the thoracolumbar spine. No acute or suspicious osseous abnormality. IMPRESSION: 1. Gastric wall thickening suggesting gastritis. Endoscopy is suggested for further evaluation on follow-up. 2. Diverticulosis without diverticulitis. 3. Left renal cyst and calculus. 4. Hepatic steatosis. 5.  Aortic atherosclerosis and coronary artery calcification. Electronically Signed   By: LBrett FairyM.D.   On: 12/10/2021 01:41     Discharge Exam: Vitals:   01/04/22 1949 01/05/22 0324  BP: (!) 147/83 (!) 143/85  Pulse: 80 68  Resp: 18 15  Temp: 97.9 F (36.6 C) 97.8 F (36.6 C)  SpO2: 93% 94%   Vitals:   01/04/22 1447 01/04/22 1600 01/04/22 1949 01/05/22 0324  BP: 132/75 136/81 (!) 147/83 (!) 143/85  Pulse: 78 80 80 68  Resp: _0 Temp: 98.1 F (36.7 C)  97.9 F (36.6 C) 97.8 F (36.6 C)  TempSrc: Oral     SpO2: 94% 94% 93% 94%  Weight:      Height:         The results of significant diagnostics from this hospitalization (including imaging, microbiology, ancillary and laboratory) are listed below for reference.     Microbiology: Recent Results (from the past 240 hour(s))  Resp Panel by RT-PCR (Flu A&B, Covid) Anterior Nasal Swab     Status: None   Collection Time: 01/03/22  1:05 PM   Specimen: Anterior Nasal Swab  Result Value Ref Range Status   SARS Coronavirus 2 by RT PCR NEGATIVE NEGATIVE Final    Comment: (NOTE) SARS-CoV-2 target nucleic acids are NOT DETECTED.  The SARS-CoV-2 RNA is generally detectable in upper  respiratory specimens during the acute phase of infection. The lowest concentration of SARS-CoV-2 viral copies this assay can detect is 138 copies/mL. A negative result does not preclude SARS-Cov-2 infection and should not be used as the sole basis for treatment or other patient management decisions. A negative result may occur with  improper specimen collection/handling, submission of specimen other than nasopharyngeal swab, presence of viral mutation(s) within the areas targeted by this assay, and inadequate number of viral copies(<138 copies/mL). A negative result must be combined with clinical observations, patient history, and epidemiological information. The expected result is Negative.  Fact Sheet for Patients:   EntrepreneurPulse.com.au  Fact Sheet for Healthcare Providers:  IncredibleEmployment.be  This test is no t yet approved or cleared by the [place] FDA and  has been authorized for detection and/or diagnosis of SARS-CoV-2 by FDA under an Emergency Use Authorization (EUA). This EUA will remain  in effect (meaning this test can be used) for the duration of the COVID-19 declaration under Section 564(b)(1) of the Act, 21 U.S.C.section 360bbb-3(b)(1), unless the authorization is terminated  or revoked sooner.       Influenza A by PCR NEGATIVE NEGATIVE Final   Influenza B by PCR NEGATIVE NEGATIVE Final    Comment: (NOTE) The Xpert Xpress SARS-CoV-2/FLU/RSV plus assay is intended as an aid in the diagnosis of influenza from Nasopharyngeal swab specimens and should not be used as a sole basis for treatment. Nasal washings and aspirates are unacceptable for Xpert Xpress SARS-CoV-2/FLU/RSV testing.  Fact Sheet for Patients: EntrepreneurPulse.com.au  Fact Sheet for Healthcare Providers: IncredibleEmployment.be  This test is not yet approved or cleared by the [place] FDA and has been authorized for detection and/or diagnosis of SARS-CoV-2 by FDA under an Emergency Use Authorization (EUA). This EUA will remain in effect (meaning this test can be used) for the duration of the COVID-19 declaration under Section 564(b)(1) of the Act, 21 U.S.C. section 360bbb-3(b)(1), unless the authorization is terminated or revoked.  Performed at [facility], [address], [place], [place] [address]      Labs: BNP (last 3 results) Recent Labs    01/03/22 1245  BNP 74.8   Basic Metabolic Panel: Recent Labs  Lab 01/03/22 1245 01/03/22 1902  NA 135  --   K 3.4*  --   CL 104  --   CO2 22  --   GLUCOSE 310*  --   BUN 14  --   CREATININE 0.70 0.66  CALCIUM 8.6*  --    Liver Function Tests: No results for  input(s): "AST", "ALT", "ALKPHOS", "BILITOT", "PROT", "ALBUMIN" in the last 168 hours. No results for input(s): "LIPASE", "AMYLASE" in the last 168 hours. No results for input(s): "AMMONIA" in the last 168 hours. CBC: Recent Labs  Lab 01/03/22 1245 01/03/22 1902  WBC 10.0 11.2*  NEUTROABS 6.7  --   HGB 13.8 13.8  HCT 42.3 42.3  MCV 81.0 80.6  PLT 492* 481*   Cardiac Enzymes: No results for input(s): "CKTOTAL", "CKMB", "CKMBINDEX", "TROPONINI" in the last 168 hours. BNP: Invalid input(s): "POCBNP" CBG: Recent Labs  Lab 01/04/22 1613 01/04/22 2059 01/05/22 0330 01/05/22 0752 01/05/22 1129  GLUCAP 251* 223* 214* 271* 251*   D-Dimer Recent Labs    01/03/22 1439  DDIMER 0.48   Hgb A1c Recent Labs    01/03/22 1700  HGBA1C 10.6*   Lipid Profile Recent Labs    01/03/22 1617 01/04/22 0433  CHOL 167 180  HDL 32* 33*  LDLCALC 63  80  TRIG 359* 333*  CHOLHDL 5.2 5.5   Thyroid function studies No results for input(s): "TSH", "T4TOTAL", "T3FREE", "THYROIDAB" in the last 72 hours.  Invalid input(s): "FREET3" Anemia work up No results for input(s): "VITAMINB12", "FOLATE", "FERRITIN", "TIBC", "IRON", "RETICCTPCT" in the last 72 hours. Urinalysis    Component Value Date/Time   COLORURINE YELLOW 10/24/2019 2100   APPEARANCEUR CLEAR 10/24/2019 2100   LABSPEC >1.046 (H) 10/24/2019 2100   PHURINE 6.0 10/24/2019 2100   GLUCOSEU >=500 (A) 10/24/2019 2100   HGBUR NEGATIVE 10/24/2019 2100   BILIRUBINUR NEGATIVE 10/24/2019 2100   KETONESUR NEGATIVE 10/24/2019 2100   PROTEINUR NEGATIVE 10/24/2019 2100   UROBILINOGEN 0.2 12/16/2012 1820   NITRITE POSITIVE (A) 10/24/2019 2100   LEUKOCYTESUR NEGATIVE 10/24/2019 2100   Sepsis Labs Recent Labs  Lab 01/03/22 1245 01/03/22 1902  WBC 10.0 11.2*   Microbiology Recent Results (from the past 240 hour(s))  Resp Panel by RT-PCR (Flu A&B, Covid) Anterior Nasal Swab     Status: None   Collection Time: 01/03/22  1:05 PM    Specimen: Anterior Nasal Swab  Result Value Ref Range Status   SARS Coronavirus 2 by RT PCR NEGATIVE NEGATIVE Final    Comment: (NOTE) SARS-CoV-2 target nucleic acids are NOT DETECTED.  The SARS-CoV-2 RNA is generally detectable in upper respiratory specimens during the acute phase of infection. The lowest concentration of SARS-CoV-2 viral copies this assay can detect is 138 copies/mL. A negative result does not preclude SARS-Cov-2 infection and should not be used as the sole basis for treatment or other patient management decisions. A negative result may occur with  improper specimen collection/handling, submission of specimen other than nasopharyngeal swab, presence of viral mutation(s) within the areas targeted by this assay, and inadequate number of viral copies(<138 copies/mL). A negative result must be combined with clinical observations, patient history, and epidemiological information. The expected result is Negative.  Fact Sheet for Patients:  EntrepreneurPulse.com.au  Fact Sheet for Healthcare Providers:  IncredibleEmployment.be  This test is no t yet approved or cleared by the [place] FDA and  has been authorized for detection and/or diagnosis of SARS-CoV-2 by FDA under an Emergency Use Authorization (EUA). This EUA will remain  in effect (meaning this test can be used) for the duration of the COVID-19 declaration under Section 564(b)(1) of the Act, 21 U.S.C.section 360bbb-3(b)(1), unless the authorization is terminated  or revoked sooner.       Influenza A by PCR NEGATIVE NEGATIVE Final   Influenza B by PCR NEGATIVE NEGATIVE Final    Comment: (NOTE) The Xpert Xpress SARS-CoV-2/FLU/RSV plus assay is intended as an aid in the diagnosis of influenza from Nasopharyngeal swab specimens and should not be used as a sole basis for treatment. Nasal washings and aspirates are unacceptable for Xpert Xpress  SARS-CoV-2/FLU/RSV testing.  Fact Sheet for Patients: EntrepreneurPulse.com.au  Fact Sheet for Healthcare Providers: IncredibleEmployment.be  This test is not yet approved or cleared by the [place] FDA and has been authorized for detection and/or diagnosis of SARS-CoV-2 by FDA under an Emergency Use Authorization (EUA). This EUA will remain in effect (meaning this test can be used) for the duration of the COVID-19 declaration under Section 564(b)(1) of the Act, 21 U.S.C. section 360bbb-3(b)(1), unless the authorization is terminated or revoked.  Performed at [facility], [address], [place],  [address]    Time coordinating discharge:   SIGNED:  Irwin Brakeman, MD  [facility] 01/05/2022, 2:57 PM How to contact the  [facility] Attending or Consulting provider [place] or covering provider during after hours [place], for this patient?  Check the care team in [facility] and look for a) attending/consulting [facility] provider listed and b) the [facility] team listed Log into [email or web address] and use [place]'s universal password to access. If you do not have the password, please contact the hospital operator. Locate the [facility] provider you are looking for under [facility] and page to a number that you can be directly reached. If you still have difficulty reaching the provider, please page the [facility] (Director on Call) for the Hospitalists listed on amion for assistance.

## 2022-01-08 ENCOUNTER — Telehealth: Payer: Self-pay | Admitting: Internal Medicine

## 2022-01-08 NOTE — Telephone Encounter (Signed)
Pt was seen recently by RMR and called today wanting to make another OV with RMR. Please advise if he needs OV or can RMR make recommendations for him. [phone]

## 2022-01-08 NOTE — Telephone Encounter (Signed)
OV made for 9/5 with RMR and appt card mailed

## 2022-01-08 NOTE — Telephone Encounter (Signed)
Spoke with patient and informed him that he will need to be seen by cardiology first and make sure that they notate in their note that he is cleared to have any procedures with Dr. Gala Romney before being seen here for a follow up. You can go ahead and make the patient an appt with Dr. Gala Romney, but make sure that it is after 01/17/2022.

## 2022-01-15 NOTE — Progress Notes (Unsigned)
Cardiology Office Note:    Date:  01/17/2022   ID:  [name] [name], DOB 1966/02/25, MRN [id number]  PCP:  [name] Found, MD   [facility] Providers Cardiologist:  None     Referring MD: [name] Found, MD   Chief Complaint: hospital follow-up for chest pain  History of Present Illness:    [name] [name] is a pleasant  56 y.o. male with a hx of asthma, HTN, HLD, OSA on CPAP, type 2 diabetes, and morbid obesity  Previously seen by [name] [name]. TTE in 2014 showed normal LVEF 55%, no significant valve disease.  Admission 8/2-01/05/22 who presented to [facility] ED with intermittent left-sided chest pain and shortness of breath ongoing for previous 2 weeks.  Reported frequent sharp pains in chest worsened by lying on right side.  Feeling more SOB with activity or with lying flat.  Currently sleeping on 4 pillows as he feels "gurgling" in his throat when he lies flat. Very fatigued, can barely get out of bed due to symptoms. Intermittent episodes of diaphoresis. Undergoing work-up in ED for gastritis. Hs troponin 6 ? 6, BNP 15, CXR no active cardiopulmonary disease.  ECG with NSR with PACs, LAFB, poor R wave progression.  TTE revealed normal LVEF 55 to 60%, G1 DD, mild LVH normal RV, aortic valve sclerosis with no evidence of stenosis, borderline dilatation of aortic root 38 mm but no significant change from prior echo 2014. Coronary CTA revealed nonobstructive CAD.  Chest pain thought to be GI related and omeprazole was increased. Advised to follow-up with cardiology as OP.   Today, he is here with his wife. Reports he is feeling better since hospital discharge although he continues to have some chest discomfort, is relieved to know that his heart function is good and there is no obstructive coronary disease. Prior to hospital admission, was coughing more. Has had severe acid reflux for many years. Chest pain comes from different locations, wonders if it is nerves, from a new hernia, or gastric. BP has been  good for years. Has been sleeping sitting up for him years due to acid reflux and has pain in chest when he lies on left or right side. 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 Has no hair on lower part of his legs, says someone told him this could be a sign of poor circulation. Has pain in legs with walking. Sometimes they feel "tingly." Legs felt better in the hospital after getting  Lovenox injections. He denies shortness of breath, lower extremity edema, fatigue, palpitations, melena, hematuria, hemoptysis, diaphoresis, weakness, presyncope, syncope, orthopnea, and PND.  Past Medical History:  Diagnosis Date   Arthritis    Asthma    as child   Back pain, chronic 02/18/11   BULGING DISK   Complication of anesthesia    "woke up during spleenectomy at [facility]".   Diabetes mellitus without complication (HCC)    GERD (gastroesophageal reflux disease)    erosive reflux esophagitis, small HH   Hemorrhoids    History of kidney stones    Hyperlipemia    Hypertension    Hypothyroidism    IBS (irritable bowel syndrome)    Low serum cortisol level    Dr [name] [name]   Lower extremity edema    Nephrolithiasis 12/19/2012   Plantar fasciitis    Restless leg syndrome    S/P colonoscopy 08/2008   int/ext hemorrhoids. friable, rectal polyp   S/P endoscopy 01/10/09   Dr [name] [name], small [facility]  Sleep apnea    cpap   Spinal stenosis of lumbar region 12/18/2012   Thyroid disease    Tubular adenoma of colon 2000   UTI (lower urinary tract infection)     Past Surgical History:  Procedure Laterality Date   CHOLECYSTECTOMY  2006   COLONOSCOPY  2000   Dr. Gala [name]- tubular adenoma   COLONOSCOPY  08/10/2008   Dr. Gala [name]-  IMPRESSION:  Prominent external anal canal hemorrhoids, hyperplastic polyp   COLONOSCOPY N/A 10/08/2013   [id number] colonic polyps -  removed as described above/Prominent grade 3 hemorrhoids -  likely source of hematochezia. tubular adenomas. next TCS 10/2016   COLONOSCOPY WITH  PROPOFOL N/A 02/25/2017   Procedure: COLONOSCOPY WITH PROPOFOL;  Surgeon: [name] Dolin, MD;  Grade 4 internal hemorrhoids, 1 tubular adenoma, recommendations to repeat in 5 years.    CYSTOSCOPY     with utetheral dilation   DISTAL BICEPS TENDON REPAIR  02/20/2012   Procedure: DISTAL BICEPS TENDON REPAIR;  Surgeon: [name] Junes, MD;  Location: [place];  Service: Orthopedics;  Laterality: Left;  left elbow distal biceps tendon repair   ESOPHAGOGASTRODUODENOSCOPY  2010   Dr. Gala [name]: normal   FLEXIBLE SIGMOIDOSCOPY N/A 11/10/2013   ShaneRourk- internal/external hemorrhoids. external hemorrhoids grade 3 with some residual prolapsing tissue. normal rectum aside form hemorrhoids. s/p band placement.   HEMORRHOID BANDING N/A 11/10/2013   Procedure: HEMORRHOID BANDING;  Surgeon: [name] Dolin, MD;  [facility];  Service: Endoscopy;  Laterality: N/A;   HERNIA REPAIR     INCISIONAL HERNIA REPAIR N/A 04/10/2017   Procedure: [name] [name] HERNIORRHAPHY WITH MESH;  Surgeon: [name] Signs, MD;  Location: [facility];  Service: General;  Laterality: N/A;   KNEE SURGERY     left   POLYPECTOMY  02/25/2017   Procedure: POLYPECTOMY - ASCENDING COLON;  Surgeon: [name] Dolin, MD;  [facility];  Service: Endoscopy;;   SPLENECTOMY  2003   itp    Current Medications: Current Meds  Medication Sig   amLODipine (NORVASC) 10 MG tablet Take 1 tablet (10 mg total) by mouth daily.   blood glucose meter kit and supplies Dispense based on patient and insurance preference. Use up to four times daily as directed. (FOR ICD-10 E10.9, E11.9).   carvedilol (COREG) 3.125 MG tablet Take 1 tablet (3.125 mg total) by mouth 2 (two) times daily with a meal.   cholecalciferol (VITAMIN D3) 25 MCG (1000 UNIT) tablet Take 1,000 Units by mouth daily.   DULoxetine (CYMBALTA) 60 MG capsule Take 120 mg by mouth at bedtime.    glimepiride (AMARYL) 4 MG tablet Take 4 mg by mouth 2 (two) times daily.    hydrocortisone (CORTEF) 20 MG tablet Take 1 tablet (20 mg total) by mouth daily. For cortisol replacement   insulin detemir (LEVEMIR) 100 UNIT/ML injection Inject 0.15 mLs (15 Units total) into the skin at bedtime.   Insulin Syringe-Needle U-100 (INSULIN SYRINGE .5CC/31GX5/16") 31G X 5/16" 0.5 ML MISC 1 Syringe by Does not apply route as directed.   levothyroxine (SYNTHROID) 150 MCG tablet Take 150 mcg by mouth every morning.   losartan (COZAAR) 100 MG tablet Take 100 mg by mouth daily.   metFORMIN (GLUCOPHAGE) 1000 MG tablet Take 1,000 mg by mouth 2 (two) times daily.   Multiple Vitamins-Minerals (MENS MULTIVITAMIN PLUS) TABS Take 1 tablet by mouth daily. /For nutritional supplementation.   Omega-3 Fatty Acids (OMEGA-3 FISH OIL PO) Take 1 capsule by mouth at bedtime.  omeprazole (PRILOSEC) 40 MG capsule Take 1 capsule (40 mg total) by mouth 2 (two) times daily.   rosuvastatin (CRESTOR) 20 MG tablet Take 1 tablet (20 mg total) by mouth daily.   sitaGLIPtin (JANUVIA) 100 MG tablet Take 100 mg by mouth at bedtime.    tamsulosin (FLOMAX) 0.4 MG CAPS capsule Take 0.4 mg by mouth in the morning.    [DISCONTINUED] aspirin EC 81 MG tablet Take 81 mg by mouth every evening.    [DISCONTINUED] simvastatin (ZOCOR) 40 MG tablet Take 40 mg by mouth at bedtime.      Allergies:   Dilaudid [hydromorphone], Nitroglycerin, and Pregabalin   Social History   Socioeconomic History   Marital status: Married    Spouse name: Not on file   Number of children: 1   Years of education: Not on file   Highest education level: Not on file  Occupational History   Occupation: truck driver-local     Employer: [employer]  Tobacco Use   Smoking status: Former    Packs/day: 0.50    Years: 30.00    Total pack years: 15.00    Types: Cigarettes    Quit date: 03/05/2007    Years since quitting: 14.8   Smokeless tobacco: Never  Vaping Use   Vaping Use: Never used  Substance and Sexual Activity   Alcohol use: No     Comment: 2008   Drug use: No    Comment: 2008   Sexual activity: Yes  Other Topics Concern   Not on file  Social History Narrative   1 healthy son   Social Determinants of Health   Financial Resource Strain: Not on file  Food Insecurity: Not on file  Transportation Needs: Not on file  Physical Activity: Not on file  Stress: Not on file  Social Connections: Not on file     Family History: The patient's family history includes Colon cancer (age of onset: 19) in his father; Diabetes in his brother.  ROS:   Please see the history of present illness.    + chest pain All other systems reviewed and are negative.  Labs/Other Studies Reviewed:    The following studies were reviewed today:  CCTA 01/05/22  1. Coronary calcium score of 144. This was 84th percentile for age and sex matched control. Unfortunately contrast was administered prior to initial calcium score. Calcium score was repeated 5 minutes later, but question the accuracy of calcium score given prior contrast administration   2. Poor quality study due to noise secondary to obesity   3. Normal coronary origin with right dominance.   4. Nonobstructive CAD, with calcified plaque causing minimal (0-24%) stenosis   Echo 01/04/22   1. Left ventricular ejection fraction, by estimation, is 55 to 60%. The  left ventricle has normal function. The left ventricle has no regional  wall motion abnormalities. There is mild concentric left ventricular  hypertrophy. Left ventricular diastolic  parameters are consistent with Grade I diastolic dysfunction (impaired  relaxation).   2. Right ventricular systolic function is normal. The right ventricular  size is normal. Tricuspid regurgitation signal is inadequate for assessing  PA pressure.   3. The mitral valve is normal in structure. Trivial mitral valve  regurgitation.   4. The aortic valve is tricuspid. Aortic valve regurgitation is not  visualized. Aortic valve  sclerosis is present, with no evidence of aortic  valve stenosis.   5. Aortic dilatation noted. There is borderline dilatation of the aortic  root, measuring  38 mm.   6. The inferior vena cava is normal in size with greater than 50%  respiratory variability, suggesting right atrial pressure of 3 mmHg.   Comparison(s): Compared to prior TTE report in 2014, there is no  significant change.   Recent Labs: 01/03/2022: B Natriuretic Peptide 15.0; BUN 14; Creatinine, Ser 0.66; Hemoglobin 13.8; Platelets 481; Potassium 3.4; Sodium 135  Recent Lipid Panel    Component Value Date/Time   CHOL 180 01/04/2022 0433   TRIG 333 (H) 01/04/2022 0433   HDL 33 (L) 01/04/2022 0433   CHOLHDL 5.5 01/04/2022 0433   VLDL 67 (H) 01/04/2022 0433   LDLCALC 80 01/04/2022 0433     Risk Assessment/Calculations:      Physical Exam:    VS:  BP 118/68   Pulse 92   Ht $R'5\' 7"'ng$  (1.702 m)   Wt 294 lb (133.4 kg)   SpO2 93%   BMI 46.05 kg/m     Wt Readings from Last 3 Encounters:  01/17/22 294 lb (133.4 kg)  01/03/22 300 lb (136.1 kg)  11/28/21 294 lb 9.6 oz (133.6 kg)     GEN:  Well developed obese male in no acute distress HEENT: Normal NECK: No JVD; No carotid bruits CARDIAC: RRR, no murmurs, rubs, gallops RESPIRATORY:  Clear to auscultation without rales, wheezing or rhonchi  ABDOMEN: Soft, non-tender, protruding   MUSCULOSKELETAL:  No edema; No deformity. 2+ pedal pulses, equal bilaterally SKIN: Warm and dry NEUROLOGIC:  Alert and oriented x 3 PSYCHIATRIC:  Normal affect   EKG:  EKG is not ordered today.   Diagnoses:    1. Claudication ([place])   2. Coronary artery disease involving native coronary artery of native heart without angina pectoris   3. Precordial pain   4. Hyperlipidemia LDL goal <70   5. Chronic heart failure with preserved ejection fraction (HCC)   6. Diabetes mellitus with cardiac complication ([place])   7. Tobacco abuse    Assessment and Plan:     Non-obstructive CAD: CCTA  01/05/22 revealed 0-24% stenoses in RCA, LAD, LCx. Coronary calcium score 144, 84th percentile for age/sex. LDL 80 on 01/04/22. Emphasized the importance of healthy lifestyle for reduction in cardiac risks. Will change from simvastatin to rosuvastatin as noted below. Advised he may remain off aspirin due to GI issues. Continue statin, losartan, amlodipine.   Precordial chest pain: Hospital admission with chest pain 01/2022. EKG with nonspecific ST-T changes, Troponins negative x2. Coronary CTA revealed nonobstructive CAD with calcified plaque 0 to 24% in multiple vessels. Pain likely multifactorial, non-cardiac. Feels food and liquids going down his esophagus. Long history of GERD, on PPI for many years. Did not increase omeprazole as advised. Has follow-up with GI, requests that I send note for sooner appointment. Emphasized the importance of healthy diet and weight loss.   Claudication: Pain in bilateral lower extremities with walking. Decreased hair growth distally. Will get lower extremity arterial study for evaluation of PAD. Encouraged him to quit smoking.   Hyperlipidemia LDL goal < 70: LDL 80 on 01/04/22. Will change simvastatin to rosuvastatin. Would recommend recheck at next office visit.   Diabetes: A1C 10.6 on 01/03/22. Now monitoring glucose more closely. Admits he needs to take better care of himself. Encouraged heart healthy, mostly plant-based diet, regular exercise and weight loss for heart as well as diabetes benefit. Management per PCP.   HFpEF: LVEF 55-60%, G1DD, mild LVH on echo 01/04/22. Volume status is difficult to assess due to body habitus. He denies dyspnea,  orthopnea, PND, edema. Sleeps sitting up due to reflux per patient.  Recommend weight loss, low-sodium diet.  Continue losartan, carvedilol.   Tobacco abuse: Says he smokes very little. Complete cessation advised.     Disposition: 3 months with [facility] provider  Medication Adjustments/Labs and Tests Ordered: Current medicines are  reviewed at length with the patient today.  Concerns regarding medicines are outlined above.  Orders Placed This Encounter  Procedures   VAS [place] ABI WITH/WO TBI   VAS [place] LOWER EXTREMITY ARTERIAL DUPLEX   Meds ordered this encounter  Medications   rosuvastatin (CRESTOR) 20 MG tablet    Sig: Take 1 tablet (20 mg total) by mouth daily.    Dispense:  90 tablet    Refill:  3    Order Specific Question:   Supervising Provider    Answer:   Thayer Headings [phone]    Patient Instructions  Medication Instructions:  Stop Simvastatin Start Rosuvastatin (Crestor) 20 mg  *If you need a refill on your cardiac medications before your next appointment, please call your pharmacy*   Lab Work: None Ordered   Testing/Procedures: Your physician has requested that you have a lower extremity arterial exercise duplex. During this test, exercise and ultrasound are used to evaluate arterial blood flow in the legs. Allow one hour for this exam. There are no restrictions or special instructions.  Your physician has requested that you have an ankle brachial index (ABI). During this test an ultrasound and blood pressure cuff are used to evaluate the arteries that supply the arms and legs with blood. Allow thirty minutes for this exam. There are no restrictions or special instructions.   Follow-Up: At [facility], you and your health needs are our priority.  As part of our continuing mission to provide you with exceptional heart care, we have created designated Provider Care Teams.  These Care Teams include your primary Cardiologist (physician) and Advanced Practice Providers (APPs -  Physician Assistants and Nurse Practitioners) who all work together to provide you with the care you need, when you need it.  We recommend signing up for the patient portal called "MyChart".  Sign up information is provided on this After Visit Summary.  MyChart is used to connect with patients for Virtual Visits (Telemedicine).   Patients are able to view lab/test results, encounter notes, upcoming appointments, etc.  Non-urgent messages can be sent to your provider as well.   To learn more about what you can do with MyChart, go to [email or web address].    Your next appointment:   3 month(s)  The format for your next appointment:   In Person  Provider:   [facility] Providers      Other Instructions Adopting a Healthy Lifestyle.   Weight: Know what a healthy weight is for you (roughly BMI <25) and aim to maintain this. You can calculate your body mass index on your smart phone  Diet: Aim for 7+ servings of fruits and vegetables daily Limit animal fats in diet for cholesterol and heart health - choose grass fed whenever available Avoid highly processed foods (fast food burgers, tacos, fried chicken, pizza, hot dogs, french fries)  Saturated fat comes in the form of butter, lard, coconut oil, margarine, partially hydrogenated oils, and fat in meat. These increase your risk of cardiovascular disease.  Use healthy plant oils, such as olive, canola, soy, corn, sunflower and peanut.  Whole foods such as fruits, vegetables and whole grains have fiber  Men need > 38 grams of  fiber per day Women need > 25 grams of fiber per day  Load up on vegetables and fruits - one-half of your plate: Aim for color and variety, and remember that potatoes dont count. Go for whole grains - one-quarter of your plate: Whole wheat, barley, wheat berries, quinoa, oats, brown rice, and foods made with them. If you want pasta, go with whole wheat pasta. Protein power - one-quarter of your plate: Fish, chicken, beans, and nuts are all healthy, versatile protein sources. Limit red meat. You need carbohydrates for energy! The type of carbohydrate is more important than the amount. Choose carbohydrates such as vegetables, fruits, whole grains, beans, and nuts in the place of white rice, white pasta, potatoes (baked or fried), macaroni and cheese,  cakes, cookies, and donuts.  If youre thirsty, drink water. Coffee and tea are good in moderation, but skip sugary drinks and limit milk and dairy products to one or two daily servings. Keep sugar intake at 6 teaspoons or 24 grams or LESS       Exercise: Aim for 150 min of moderate intensity exercise weekly for heart health, and weights twice weekly for bone health Stay active - any steps are better than no steps! Aim for 7-9 hours of sleep daily        Important Information About Sugar         Signed, [name] Life, NP  01/17/2022 4:43 PM    [facility]

## 2022-01-16 DIAGNOSIS — E049 Nontoxic goiter, unspecified: Secondary | ICD-10-CM | POA: Insufficient documentation

## 2022-01-16 DIAGNOSIS — E7800 Pure hypercholesterolemia, unspecified: Secondary | ICD-10-CM | POA: Insufficient documentation

## 2022-01-16 DIAGNOSIS — E890 Postprocedural hypothyroidism: Secondary | ICD-10-CM | POA: Insufficient documentation

## 2022-01-16 DIAGNOSIS — E291 Testicular hypofunction: Secondary | ICD-10-CM | POA: Insufficient documentation

## 2022-01-16 DIAGNOSIS — E119 Type 2 diabetes mellitus without complications: Secondary | ICD-10-CM | POA: Insufficient documentation

## 2022-01-16 DIAGNOSIS — E2740 Unspecified adrenocortical insufficiency: Secondary | ICD-10-CM | POA: Insufficient documentation

## 2022-01-17 ENCOUNTER — Encounter: Payer: Self-pay | Admitting: Nurse Practitioner

## 2022-01-17 ENCOUNTER — Ambulatory Visit (INDEPENDENT_AMBULATORY_CARE_PROVIDER_SITE_OTHER): Payer: BC Managed Care – PPO | Admitting: Nurse Practitioner

## 2022-01-17 VITALS — BP 118/68 | HR 92 | Ht 67.0 in | Wt 294.0 lb

## 2022-01-17 DIAGNOSIS — I2510 Atherosclerotic heart disease of native coronary artery without angina pectoris: Secondary | ICD-10-CM

## 2022-01-17 DIAGNOSIS — R072 Precordial pain: Secondary | ICD-10-CM | POA: Diagnosis not present

## 2022-01-17 DIAGNOSIS — Z720 Tobacco use: Secondary | ICD-10-CM

## 2022-01-17 DIAGNOSIS — I739 Peripheral vascular disease, unspecified: Secondary | ICD-10-CM

## 2022-01-17 DIAGNOSIS — E785 Hyperlipidemia, unspecified: Secondary | ICD-10-CM | POA: Diagnosis not present

## 2022-01-17 DIAGNOSIS — E1159 Type 2 diabetes mellitus with other circulatory complications: Secondary | ICD-10-CM

## 2022-01-17 DIAGNOSIS — I5032 Chronic diastolic (congestive) heart failure: Secondary | ICD-10-CM

## 2022-01-17 MED ORDER — ROSUVASTATIN CALCIUM 20 MG PO TABS: 20.0000 mg | ORAL_TABLET | Freq: Every day | ORAL | 3 refills | Status: DC

## 2022-01-17 NOTE — Patient Instructions (Addendum)
Medication Instructions:  Stop Simvastatin Start Rosuvastatin (Crestor) 20 mg  *If you need a refill on your cardiac medications before your next appointment, please call your pharmacy*   Lab Work: None Ordered   Testing/Procedures: Your physician has requested that you have a lower extremity arterial exercise duplex. During this test, exercise and ultrasound are used to evaluate arterial blood flow in the legs. Allow one hour for this exam. There are no restrictions or special instructions.  Your physician has requested that you have an ankle brachial index (ABI). During this test an ultrasound and blood pressure cuff are used to evaluate the arteries that supply the arms and legs with blood. Allow thirty minutes for this exam. There are no restrictions or special instructions.   Follow-Up: At [facility], you and your health needs are our priority.  As part of our continuing mission to provide you with exceptional heart care, we have created designated Provider Care Teams.  These Care Teams include your primary Cardiologist (physician) and Advanced Practice Providers (APPs -  Physician Assistants and Nurse Practitioners) who all work together to provide you with the care you need, when you need it.  We recommend signing up for the patient portal called "MyChart".  Sign up information is provided on this After Visit Summary.  MyChart is used to connect with patients for Virtual Visits (Telemedicine).  Patients are able to view lab/test results, encounter notes, upcoming appointments, etc.  Non-urgent messages can be sent to your provider as well.   To learn more about what you can do with MyChart, go to [email or web address].    Your next appointment:   3 month(s)  The format for your next appointment:   In Person  Provider:   [facility] Providers      Other Instructions Adopting a Healthy Lifestyle.   Weight: Know what a healthy weight is for you (roughly BMI <25) and aim to  maintain this. You can calculate your body mass index on your smart phone  Diet: Aim for 7+ servings of fruits and vegetables daily Limit animal fats in diet for cholesterol and heart health - choose grass fed whenever available Avoid highly processed foods (fast food burgers, tacos, fried chicken, pizza, hot dogs, french fries)  Saturated fat comes in the form of butter, lard, coconut oil, margarine, partially hydrogenated oils, and fat in meat. These increase your risk of cardiovascular disease.  Use healthy plant oils, such as olive, canola, soy, corn, sunflower and peanut.  Whole foods such as fruits, vegetables and whole grains have fiber  Men need > 38 grams of fiber per day Women need > 25 grams of fiber per day  Load up on vegetables and fruits - one-half of your plate: Aim for color and variety, and remember that potatoes dont count. Go for whole grains - one-quarter of your plate: Whole wheat, barley, wheat berries, quinoa, oats, brown rice, and foods made with them. If you want pasta, go with whole wheat pasta. Protein power - one-quarter of your plate: Fish, chicken, beans, and nuts are all healthy, versatile protein sources. Limit red meat. You need carbohydrates for energy! The type of carbohydrate is more important than the amount. Choose carbohydrates such as vegetables, fruits, whole grains, beans, and nuts in the place of white rice, white pasta, potatoes (baked or fried), macaroni and cheese, cakes, cookies, and donuts.  If youre thirsty, drink water. Coffee and tea are good in moderation, but skip sugary drinks and limit milk and dairy products  to one or two daily servings. Keep sugar intake at 6 teaspoons or 24 grams or LESS       Exercise: Aim for 150 min of moderate intensity exercise weekly for heart health, and weights twice weekly for bone health Stay active - any steps are better than no steps! Aim for 7-9 hours of sleep daily        Important Information  About Sugar

## 2022-01-18 ENCOUNTER — Telehealth: Payer: Self-pay

## 2022-01-18 NOTE — Addendum Note (Signed)
Addended by: Ulice Brilliant T on: 01/18/2022 10:51 AM   Modules accepted: Orders

## 2022-01-18 NOTE — Telephone Encounter (Signed)
Contacted the patient to make him aware that the providers prefer testing be completed in office.   Spoke with ultrasound at [facility] and received the correct ordere number.   Order placed.

## 2022-01-18 NOTE — Addendum Note (Signed)
Addended by: Ulice Brilliant T on: 01/18/2022 10:50 AM   Modules accepted: Orders

## 2022-01-26 ENCOUNTER — Encounter (HOSPITAL_COMMUNITY): Payer: Self-pay

## 2022-01-26 ENCOUNTER — Ambulatory Visit (HOSPITAL_COMMUNITY): Admission: RE | Admit: 2022-01-26 | Payer: BC Managed Care – PPO | Source: Ambulatory Visit

## 2022-02-02 ENCOUNTER — Other Ambulatory Visit: Payer: Self-pay

## 2022-02-02 ENCOUNTER — Telehealth: Payer: Self-pay

## 2022-02-02 MED ORDER — ONDANSETRON 4 MG PO TBDP
4.0000 mg | ORAL_TABLET | Freq: Three times a day (TID) | ORAL | 0 refills | Status: DC | PRN
Start: 2022-02-02 — End: 2022-02-20

## 2022-02-02 NOTE — Telephone Encounter (Signed)
Pt's wife was made aware and verbalized understanding. Rx was sent to the pharmacy.

## 2022-02-02 NOTE — Telephone Encounter (Signed)
Pt's wife Colletta [place] called stating that the patient is unable to keep anything down and is requesting that something be sent in to the pharmacy for N&V. Pt would like for this to be sent to [facility].

## 2022-02-02 NOTE — Telephone Encounter (Signed)
That is correct, patient has an appt with you on Tuesday.

## 2022-02-02 NOTE — Telephone Encounter (Signed)
Per verbal order '4mg'$  will be sent in.

## 2022-02-02 NOTE — Telephone Encounter (Signed)
Clarification: '4mg'$  or '8mg'$ ?

## 2022-02-06 ENCOUNTER — Ambulatory Visit (INDEPENDENT_AMBULATORY_CARE_PROVIDER_SITE_OTHER): Payer: BC Managed Care – PPO | Admitting: Internal Medicine

## 2022-02-06 ENCOUNTER — Encounter: Payer: Self-pay | Admitting: Internal Medicine

## 2022-02-06 VITALS — BP 128/80 | HR 91 | Temp 97.1°F | Ht 67.0 in | Wt 286.6 lb

## 2022-02-06 DIAGNOSIS — K219 Gastro-esophageal reflux disease without esophagitis: Secondary | ICD-10-CM | POA: Diagnosis not present

## 2022-02-06 DIAGNOSIS — R079 Chest pain, unspecified: Secondary | ICD-10-CM

## 2022-02-06 DIAGNOSIS — R197 Diarrhea, unspecified: Secondary | ICD-10-CM

## 2022-02-06 NOTE — Patient Instructions (Signed)
It was good to see you again today!  As discussed, we will proceed with an EGD with esophageal dilation further evaluate chest pain and choking while his abdominal pain (ASA 3).  We will go ahead and proceed with a surveillance colonoscopy as well for history of colonic polyps and recent history of change in bowel habits-ASA 3  Management of diabetes medications per office protocol  I anticipate you are going back to see your endocrinologist, Dr. Suzette Battiest, after your GI evaluation is completed.

## 2022-02-06 NOTE — H&P (View-Only) (Signed)
Primary Care Physician:  Sharilyn Sites, MD Primary Gastroenterologist:  Dr. Gala Romney  Pre-Procedure History & Physical: HPI:  [name] [name] is a 56 y.o. male here for follow-up of vague abdominal cramps and diarrhea he had earlier in the year.  We saw him back in June.  He is also having chest pain of uncertain etiology.  We referred him to cardiology prior to further GI evaluation.  He was hospitalized with acute episode of chest pain a month ago.  Found not to have active CAD.  Cardiology saw him.  Although he does have coronary artery disease he is not felt that his recent chest pain is cardiac in origin.  His typical reflux symptoms are continue to be well controlled on Prilosec 40 mg twice daily.  He states he gets choked on food and medication from time to time.  We saw him earlier for quite a bit of diarrhea postprandial abdominal cramps he says those symptoms have improved.  He was on antibiotics for pneumonia.  Previous left upper lobe infiltrate no longer present on subsequent plain films.  Weight today 286: Weight back in June was 294.  History of adrenal insufficiency on hydrocortisone at home.  Elevated hemoglobin A1c's.  He is on metformin.  States overall bowel symptoms have improved. TSH elevated and his hyroxine reportedly was doubled.  Patient states currently he has somewhat positional abdominal pain if he turns his head to the left lying supine he can have abdominal pain if he moves his head back into the neutral position pain is lessened.  He does not have postprandial abdominal pain although he does have an urge to have a bowel movement shortly after eating.  He does not always have loose stools.  Rectal bleeding has improved.  He has known significant hemorrhoids.  Gallbladder is out.   Contrast CT of the abdomen earlier this year for nonobstructing left renal calculi mild hepatic steatosis no significant atherosclerotic disease seen involving mesenteric vasculature.  No others  significant GI findings.  His wife accompanies him today, he adds to the history stating patient stays in bed most days till late in the afternoon.  Feels he does not have much energy and feels fatigued all the time.  They note that this is the way he felt prior to being diagnosed with adrenal insufficiency. Past Medical History:  Diagnosis Date   Arthritis    Asthma    as child   Back pain, chronic 02/18/11   BULGING DISK   Complication of anesthesia    "woke up during spleenectomy at [facility]".   Diabetes mellitus without complication (HCC)    GERD (gastroesophageal reflux disease)    erosive reflux esophagitis, small HH   Hemorrhoids    History of kidney stones    Hyperlipemia    Hypertension    Hypothyroidism    IBS (irritable bowel syndrome)    Low serum cortisol level    Dr Suzette Battiest   Lower extremity edema    Nephrolithiasis 12/19/2012   Plantar fasciitis    Restless leg syndrome    S/P colonoscopy 08/2008   int/ext hemorrhoids. friable, rectal polyp   S/P endoscopy 01/10/09   Dr Vivi Ferns, small [facility]   Sleep apnea    cpap   Spinal stenosis of lumbar region 12/18/2012   Thyroid disease    Tubular adenoma of colon 2000   UTI (lower urinary tract infection)     Past Surgical History:  Procedure Laterality Date   CHOLECYSTECTOMY  2006   COLONOSCOPY  2000   Dr. Gala Romney- tubular adenoma   COLONOSCOPY  08/10/2008   Dr. Gala Romney-  IMPRESSION:  Prominent external anal canal hemorrhoids, hyperplastic polyp   COLONOSCOPY N/A 10/08/2013   UXN:[id number] colonic polyps -  removed as described above/Prominent grade 3 hemorrhoids -  likely source of hematochezia. tubular adenomas. next TCS 10/2016   COLONOSCOPY WITH PROPOFOL N/A 02/25/2017   Procedure: COLONOSCOPY WITH PROPOFOL;  Surgeon: Daneil Dolin, MD;  Grade 4 internal hemorrhoids, 1 tubular adenoma, recommendations to repeat in 5 years.    CYSTOSCOPY     with utetheral dilation   DISTAL BICEPS TENDON REPAIR  02/20/2012   Procedure:  DISTAL BICEPS TENDON REPAIR;  Surgeon: Lorn Junes, MD;  Location: [place];  Service: Orthopedics;  Laterality: Left;  left elbow distal biceps tendon repair   ESOPHAGOGASTRODUODENOSCOPY  2010   Dr. Gala Romney: normal   FLEXIBLE SIGMOIDOSCOPY N/A 11/10/2013   Dr.Natividad Schlosser- internal/external hemorrhoids. external hemorrhoids grade 3 with some residual prolapsing tissue. normal rectum aside form hemorrhoids. s/p band placement.   HEMORRHOID BANDING N/A 11/10/2013   Procedure: HEMORRHOID BANDING;  Surgeon: Daneil Dolin, MD;  [facility];  Service: Endoscopy;  Laterality: N/A;   HERNIA REPAIR     INCISIONAL HERNIA REPAIR N/A 04/10/2017   Procedure: Fatima Blank HERNIORRHAPHY WITH MESH;  Surgeon: Aviva Signs, MD;  Location: [facility];  Service: General;  Laterality: N/A;   KNEE SURGERY     left   POLYPECTOMY  02/25/2017   Procedure: POLYPECTOMY - ASCENDING COLON;  Surgeon: Daneil Dolin, MD;  [facility]: [facility];  Service: Endoscopy;;   SPLENECTOMY  2003   itp    Prior to Admission medications   Medication Sig Start Date End Date Taking? Authorizing Provider  amLODipine (NORVASC) 10 MG tablet Take 1 tablet (10 mg total) by mouth daily. 01/05/22  Yes Johnson, Clanford L, MD  blood glucose meter kit and supplies Dispense based on patient and insurance preference. Use up to four times daily as directed. (FOR ICD-10 E10.9, E11.9). 01/05/22  Yes Johnson, Clanford L, MD  cholecalciferol (VITAMIN D3) 25 MCG (1000 UNIT) tablet Take 1,000 Units by mouth daily.   Yes [provider]  DULoxetine (CYMBALTA) 60 MG capsule Take 120 mg by mouth at bedtime.    Yes [provider]  glimepiride (AMARYL) 4 MG tablet Take 4 mg by mouth 2 (two) times daily. 12/22/21  Yes [provider]  hydrocortisone (CORTEF) 20 MG tablet Take 1 tablet (20 mg total) by mouth daily. For cortisol replacement 01/05/22  Yes Johnson, Clanford L, MD  levothyroxine (SYNTHROID) 150 MCG tablet  Take 150 mcg by mouth every morning. 09/30/19  Yes [provider]  losartan (COZAAR) 100 MG tablet Take 100 mg by mouth daily. 04/01/17  Yes [provider]  metFORMIN (GLUCOPHAGE) 1000 MG tablet Take 1,000 mg by mouth 2 (two) times daily. 10/21/17  Yes [provider]  Multiple Vitamins-Minerals (MENS MULTIVITAMIN PLUS) TABS Take 1 tablet by mouth daily. /For nutritional supplementation. 08/23/11  Yes Darrol Jump, MD  Omega-3 Fatty Acids (OMEGA-3 FISH OIL PO) Take 1 capsule by mouth at bedtime.    Yes [provider]  omeprazole (PRILOSEC) 40 MG capsule Take 1 capsule (40 mg total) by mouth 2 (two) times daily. 01/05/22  Yes Johnson, Clanford L, MD  ondansetron (ZOFRAN-ODT) 4 MG disintegrating tablet Take 1 tablet (4 mg total) by mouth every 8 (eight) hours as needed  for nausea or vomiting. 02/02/22  Yes Rhilee Currin, Cristopher Estimable, MD  rosuvastatin (CRESTOR) 20 MG tablet Take 1 tablet (20 mg total) by mouth daily. 01/17/22  Yes Swinyer, Lanice Schwab, NP  sitaGLIPtin (JANUVIA) 100 MG tablet Take 100 mg by mouth at bedtime.    Yes [provider]  tamsulosin (FLOMAX) 0.4 MG CAPS capsule Take 0.4 mg by mouth in the morning.    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[employer]  Tobacco Use   Smoking status: Former    Packs/day: 0.50    Years: 30.00    Total pack years: 15.00    Types: Cigarettes    Quit  date: 03/05/2007    Years since quitting: 14.9   Smokeless tobacco: Never  Vaping Use   Vaping Use: Never used  Substance and Sexual Activity   Alcohol use: No    Comment: 2008   Drug use: No    Comment: 2008   Sexual activity: Yes  Other Topics Concern   Not on file  Social History Narrative   1 healthy son   Social Determinants of Health   Financial Resource Strain: Not on file  Food Insecurity: Not on file  Transportation Needs: Not on file  Physical Activity: Not on file  Stress: Not on file  Social Connections: Not on file  Intimate Partner Violence: Not on file    Review of Systems: See HPI, otherwise negative ROS  Physical Exam: BP 128/80 (BP Location: Right Arm, Patient Position: Sitting, Cuff Size: Large)   Pulse 91   Temp (!) 97.1 F (36.2 C) (Temporal)   Ht _0  (1.702 m)   Wt 286 lb 9.6 oz (130 kg)   SpO2 96%   BMI 44.89 kg/m  General:   Alert,  Well-developed, well-nourished, pleasant and cooperative in NAD Neck:  Supple; no masses or thyromegaly. No significant cervical adenopathy. Lungs:  Clear throughout to auscultation.   No wheezes, crackles, or rhonchi. No acute distress. Heart:  Regular rate and rhythm; no murmurs, clicks, rubs,  or gallops. Abdomen: Non-distended, normal bowel sounds.  Soft and nontender without appreciable mass or hepatosplenomegaly.  Pulses:  Normal pulses noted. Extremities:  Without clubbing or edema.  Impression/Plan: 56 year old gentleman multiple comorbidities presenting 3 months ago here with the primary complaint of diarrhea in the setting of recent pneumonia treated with multiple antibiotics.  C. difficile came back negative.  He has only intermittent postprandial abdominal cramps and diarrhea.  He has had chest pain of uncertain etiology setting of known coronary disease.  Recent cardiac evaluation revealed his chest symptoms not likely cardiac in etiology.  Typical GERD symptoms have been well controlled  historically.  Esophageal dysphagia to solids and pills noted.  Generalized fatigue is interesting.  He is thyroxine was increased recently.  History of adrenal insufficiency.  He does not have true postprandial abdominal pain which makes mesenteric ischemia less likely.  There is a positional component to his abdominal discomfort which is interesting.  Does not really seem to be referred or radicular in origin.  We need to complete his GI evaluation pursuing a surveillance colonoscopy and a diagnostic EGD with possible esophageal dilation as feasible/appropriate.  I have offered them as this approach along with the potential risk benefits and limitations.  Multiple questions answered by patient and spouse.  All parties are agreeable.  Recommendations:  As discussed, we will proceed with an EGD with esophageal dilation further evaluate chest pain and choking while his abdominal pain (ASA 3).  We will go ahead and proceed with a surveillance colonoscopy as well for history of colonic polyps and recent history of change in bowel habits-ASA 3  Management of diabetes medications per office protocol  I anticipate you are going back to see your endocrinologist, Dr. Suzette Battiest, after your GI evaluation is completed.   Notice: This dictation was prepared with Dragon dictation along with smaller phrase technology. Any transcriptional errors that result from this process are unintentional and may not be corrected upon review.

## 2022-02-06 NOTE — Progress Notes (Signed)
Primary Care Physician:  Sharilyn Sites, MD Primary Gastroenterologist:  Dr. Gala Romney  Pre-Procedure History & Physical: HPI:  [name] [name] is a 56 y.o. male here for follow-up of vague abdominal cramps and diarrhea he had earlier in the year.  We saw him back in June.  He is also having chest pain of uncertain etiology.  We referred him to cardiology prior to further GI evaluation.  He was hospitalized with acute episode of chest pain a month ago.  Found not to have active CAD.  Cardiology saw him.  Although he does have coronary artery disease he is not felt that his recent chest pain is cardiac in origin.  His typical reflux symptoms are continue to be well controlled on Prilosec 40 mg twice daily.  He states he gets choked on food and medication from time to time.  We saw him earlier for quite a bit of diarrhea postprandial abdominal cramps he says those symptoms have improved.  He was on antibiotics for pneumonia.  Previous left upper lobe infiltrate no longer present on subsequent plain films.  Weight today 286: Weight back in June was 294.  History of adrenal insufficiency on hydrocortisone at home.  Elevated hemoglobin A1c's.  He is on metformin.  States overall bowel symptoms have improved. TSH elevated and his hyroxine reportedly was doubled.  Patient states currently he has somewhat positional abdominal pain if he turns his head to the left lying supine he can have abdominal pain if he moves his head back into the neutral position pain is lessened.  He does not have postprandial abdominal pain although he does have an urge to have a bowel movement shortly after eating.  He does not always have loose stools.  Rectal bleeding has improved.  He has known significant hemorrhoids.  Gallbladder is out.   Contrast CT of the abdomen earlier this year for nonobstructing left renal calculi mild hepatic steatosis no significant atherosclerotic disease seen involving mesenteric vasculature.  No others  significant GI findings.  His wife accompanies him today, he adds to the history stating patient stays in bed most days till late in the afternoon.  Feels he does not have much energy and feels fatigued all the time.  They note that this is the way he felt prior to being diagnosed with adrenal insufficiency. Past Medical History:  Diagnosis Date   Arthritis    Asthma    as child   Back pain, chronic 02/18/11   BULGING DISK   Complication of anesthesia    "woke up during spleenectomy at [facility]".   Diabetes mellitus without complication (HCC)    GERD (gastroesophageal reflux disease)    erosive reflux esophagitis, small HH   Hemorrhoids    History of kidney stones    Hyperlipemia    Hypertension    Hypothyroidism    IBS (irritable bowel syndrome)    Low serum cortisol level    Dr Suzette Battiest   Lower extremity edema    Nephrolithiasis 12/19/2012   Plantar fasciitis    Restless leg syndrome    S/P colonoscopy 08/2008   int/ext hemorrhoids. friable, rectal polyp   S/P endoscopy 01/10/09   Dr Vivi Ferns, small [facility]   Sleep apnea    cpap   Spinal stenosis of lumbar region 12/18/2012   Thyroid disease    Tubular adenoma of colon 2000   UTI (lower urinary tract infection)     Past Surgical History:  Procedure Laterality Date   CHOLECYSTECTOMY  2006   COLONOSCOPY  2000   Dr. Cloyde Oregel- tubular adenoma   COLONOSCOPY  08/10/2008   Dr. Roberta Kelly-  IMPRESSION:  Prominent external anal canal hemorrhoids, hyperplastic polyp   COLONOSCOPY N/A 10/08/2013   RMR:Multiple colonic polyps -  removed as described above/Prominent grade 3 hemorrhoids -  likely source of hematochezia. tubular adenomas. next TCS 10/2016   COLONOSCOPY WITH PROPOFOL N/A 02/25/2017   Procedure: COLONOSCOPY WITH PROPOFOL;  Surgeon: Anetha Slagel M, MD;  Grade 4 internal hemorrhoids, 1 tubular adenoma, recommendations to repeat in 5 years.    CYSTOSCOPY     with utetheral dilation   DISTAL BICEPS TENDON REPAIR  02/20/2012   Procedure:  DISTAL BICEPS TENDON REPAIR;  Surgeon: Neeko Pharo A Wainer, MD;  [facility];  Service: Orthopedics;  Laterality: Left;  left elbow distal biceps tendon repair   ESOPHAGOGASTRODUODENOSCOPY  2010   Dr. Benuel Ly: normal   FLEXIBLE SIGMOIDOSCOPY N/A 11/10/2013   Dr.Dalton Molesworth- internal/external hemorrhoids. external hemorrhoids grade 3 with some residual prolapsing tissue. normal rectum aside form hemorrhoids. s/p band placement.   HEMORRHOID BANDING N/A 11/10/2013   Procedure: HEMORRHOID BANDING;  Surgeon: Cache Decoursey M Cutler Sunday, MD;  [facility];  Service: Endoscopy;  Laterality: N/A;   HERNIA REPAIR     INCISIONAL HERNIA REPAIR N/A 04/10/2017   Procedure: INCISIONAL HERNIORRHAPHY WITH MESH;  Surgeon: Jenkins, Mark, MD;  Location: [facility];  Service: General;  Laterality: N/A;   KNEE SURGERY     left   POLYPECTOMY  02/25/2017   Procedure: POLYPECTOMY - ASCENDING COLON;  Surgeon: Theressa Piedra M, MD;  [facility];  Service: Endoscopy;;   SPLENECTOMY  2003   itp    Prior to Admission medications   Medication Sig Start Date End Date Taking? Authorizing Provider  amLODipine (NORVASC) 10 MG tablet Take 1 tablet (10 mg total) by mouth daily. 01/05/22  Yes Johnson, Clanford L, MD  blood glucose meter kit and supplies Dispense based on patient and insurance preference. Use up to four times daily as directed. (FOR ICD-10 E10.9, E11.9). 01/05/22  Yes Johnson, Clanford L, MD  cholecalciferol (VITAMIN D3) 25 MCG (1000 UNIT) tablet Take 1,000 Units by mouth daily.   Yes [provider]  DULoxetine (CYMBALTA) 60 MG capsule Take 120 mg by mouth at bedtime.    Yes [provider]  glimepiride (AMARYL) 4 MG tablet Take 4 mg by mouth 2 (two) times daily. 12/22/21  Yes [provider]  hydrocortisone (CORTEF) 20 MG tablet Take 1 tablet (20 mg total) by mouth daily. For cortisol replacement 01/05/22  Yes Johnson, Clanford L, MD  levothyroxine (SYNTHROID) 150 MCG tablet  Take 150 mcg by mouth every morning. 09/30/19  Yes [provider]  losartan (COZAAR) 100 MG tablet Take 100 mg by mouth daily. 04/01/17  Yes [provider]  metFORMIN (GLUCOPHAGE) 1000 MG tablet Take 1,000 mg by mouth 2 (two) times daily. 10/21/17  Yes [provider]  Multiple Vitamins-Minerals (MENS MULTIVITAMIN PLUS) TABS Take 1 tablet by mouth daily. /For nutritional supplementation. 08/23/11  Yes Walker, Edwin O, MD  Omega-3 Fatty Acids (OMEGA-3 FISH OIL PO) Take 1 capsule by mouth at bedtime.    Yes [provider]  omeprazole (PRILOSEC) 40 MG capsule Take 1 capsule (40 mg total) by mouth 2 (two) times daily. 01/05/22  Yes Johnson, Clanford L, MD  ondansetron (ZOFRAN-ODT) 4 MG disintegrating tablet Take 1 tablet (4 mg total) by mouth every 8 (eight) hours as needed   for nausea or vomiting. 02/02/22  Yes Carmelite Violet, Cristopher Estimable, MD  rosuvastatin (CRESTOR) 20 MG tablet Take 1 tablet (20 mg total) by mouth daily. 01/17/22  Yes Swinyer, Lanice Schwab, NP  sitaGLIPtin (JANUVIA) 100 MG tablet Take 100 mg by mouth at bedtime.    Yes [provider]  tamsulosin (FLOMAX) 0.4 MG CAPS capsule Take 0.4 mg by mouth in the morning.    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[employer]  Tobacco Use   Smoking status: Former    Packs/day: 0.50    Years: 30.00    Total pack years: 15.00    Types: Cigarettes    Quit  date: 03/05/2007    Years since quitting: 14.9   Smokeless tobacco: Never  Vaping Use   Vaping Use: Never used  Substance and Sexual Activity   Alcohol use: No    Comment: 2008   Drug use: No    Comment: 2008   Sexual activity: Yes  Other Topics Concern   Not on file  Social History Narrative   1 healthy son   Social Determinants of Health   Financial Resource Strain: Not on file  Food Insecurity: Not on file  Transportation Needs: Not on file  Physical Activity: Not on file  Stress: Not on file  Social Connections: Not on file  Intimate Partner Violence: Not on file    Review of Systems: See HPI, otherwise negative ROS  Physical Exam: BP 128/80 (BP Location: Right Arm, Patient Position: Sitting, Cuff Size: Large)   Pulse 91   Temp (!) 97.1 F (36.2 C) (Temporal)   Ht _0  (1.702 m)   Wt 286 lb 9.6 oz (130 kg)   SpO2 96%   BMI 44.89 kg/m  General:   Alert,  Well-developed, well-nourished, pleasant and cooperative in NAD Neck:  Supple; no masses or thyromegaly. No significant cervical adenopathy. Lungs:  Clear throughout to auscultation.   No wheezes, crackles, or rhonchi. No acute distress. Heart:  Regular rate and rhythm; no murmurs, clicks, rubs,  or gallops. Abdomen: Non-distended, normal bowel sounds.  Soft and nontender without appreciable mass or hepatosplenomegaly.  Pulses:  Normal pulses noted. Extremities:  Without clubbing or edema.  Impression/Plan: 56 year old gentleman multiple comorbidities presenting 3 months ago here with the primary complaint of diarrhea in the setting of recent pneumonia treated with multiple antibiotics.  C. difficile came back negative.  He has only intermittent postprandial abdominal cramps and diarrhea.  He has had chest pain of uncertain etiology setting of known coronary disease.  Recent cardiac evaluation revealed his chest symptoms not likely cardiac in etiology.  Typical GERD symptoms have been well controlled  historically.  Esophageal dysphagia to solids and pills noted.  Generalized fatigue is interesting.  He is thyroxine was increased recently.  History of adrenal insufficiency.  He does not have true postprandial abdominal pain which makes mesenteric ischemia less likely.  There is a positional component to his abdominal discomfort which is interesting.  Does not really seem to be referred or radicular in origin.  We need to complete his GI evaluation pursuing a surveillance colonoscopy and a diagnostic EGD with possible esophageal dilation as feasible/appropriate.  I have offered them as this approach along with the potential risk benefits and limitations.  Multiple questions answered by patient and spouse.  All parties are agreeable.  Recommendations:  As discussed, we will proceed with an EGD with esophageal dilation further evaluate chest pain and choking while his abdominal pain (ASA 3).  We will go ahead and proceed with a surveillance colonoscopy as well for history of colonic polyps and recent history of change in bowel habits-ASA 3  Management of diabetes medications per office protocol  I anticipate you are going back to see your endocrinologist, Dr. Suzette Battiest, after your GI evaluation is completed.   Notice: This dictation was prepared with Dragon dictation along with smaller phrase technology. Any transcriptional errors that result from this process are unintentional and may not be corrected upon review.

## 2022-02-07 ENCOUNTER — Ambulatory Visit: Payer: BC Managed Care – PPO | Admitting: Cardiology

## 2022-02-08 ENCOUNTER — Telehealth: Payer: Self-pay

## 2022-02-08 NOTE — Telephone Encounter (Signed)
Nothing available. Will call when we have an opening.

## 2022-02-08 NOTE — Telephone Encounter (Signed)
Patient  is needing a work note stating that he is out until his procedures. I spoke with Mindy and this patient won't be scheduled until November due to your schedule. The patient's last work note has run out and I am not sure what date to put down for him not knowing when his procedures will be. Please advise. Thanks.

## 2022-02-09 NOTE — Telephone Encounter (Signed)
Gave patient a work note until Nov. 1, instructed patient to call closer to that time if he has not been scheduled for his procedure. Pt verbalized understanding.

## 2022-02-12 ENCOUNTER — Encounter: Payer: Self-pay | Admitting: *Deleted

## 2022-02-12 MED ORDER — PEG 3350-KCL-NA BICARB-NACL 420 G PO SOLR: 4000.0000 mL | Freq: Once | ORAL | 0 refills | Status: AC

## 2022-02-12 NOTE — Addendum Note (Signed)
Addended by: Cheron Every on: 02/12/2022 09:17 AM   Modules accepted: Orders

## 2022-02-12 NOTE — Telephone Encounter (Signed)
Called pt. Scheduled for TCS/EGD/ED with Dr. Gala Romney on 03/29/22 at 8:30am. Aware will mail prep instructions/pre-op appt. Rx for prep sent to pharmacy.

## 2022-02-13 ENCOUNTER — Telehealth: Payer: Self-pay

## 2022-02-13 NOTE — Telephone Encounter (Signed)
Short term disability and restriction forms have been faxed here for you to fill out and sign for this patient. They are requesting that this be filled out and returned to them by 02/20/2022.

## 2022-02-16 ENCOUNTER — Telehealth: Payer: Self-pay | Admitting: *Deleted

## 2022-02-16 NOTE — Telephone Encounter (Signed)
Waiting to find out if patient will be ok with doing procedure sooner with Dr. Abbey Chatters to be able to finish paperwork.

## 2022-02-16 NOTE — Telephone Encounter (Signed)
Spoke with Dr. Gala Romney. Patient needs to have procedure ASAP. He is TCS/EGD/DIL w/ propofol asa 3. Per Dr. Gala Romney, can schedule with Dr. Abbey Chatters since he can get him in sooner. We received disability paperwork from employer.    Called pt, LMOVM

## 2022-02-19 ENCOUNTER — Encounter: Payer: Self-pay | Admitting: *Deleted

## 2022-02-19 ENCOUNTER — Other Ambulatory Visit: Payer: Self-pay | Admitting: Internal Medicine

## 2022-02-19 NOTE — Telephone Encounter (Signed)
Pt returned call. He is scheduled for Dr. Abbey Chatters 9/25 at 9:45am. He is coming by office to pick up prep sample/instructions/pre-op appt.

## 2022-02-22 ENCOUNTER — Encounter (HOSPITAL_COMMUNITY): Payer: Self-pay

## 2022-02-22 ENCOUNTER — Encounter (HOSPITAL_COMMUNITY)
Admission: RE | Admit: 2022-02-22 | Discharge: 2022-02-22 | Disposition: A | Discharge status: Home / Self Care | Payer: BC Managed Care – PPO | Source: Ambulatory Visit | Attending: Internal Medicine | Admitting: Internal Medicine

## 2022-02-22 ENCOUNTER — Other Ambulatory Visit: Payer: Self-pay

## 2022-02-22 ENCOUNTER — Encounter (HOSPITAL_COMMUNITY): Admission: RE | Admit: 2022-02-22 | Payer: BC Managed Care – PPO | Source: Ambulatory Visit

## 2022-02-22 NOTE — Patient Instructions (Addendum)
[name] [name]  02/22/2022     '@[email or web address]'$ @   Your procedure is scheduled on  02/26/2022.   Report to [facility] at  0800  A.M.   Call this number if you have problems the morning of surgery:  [phone]   Remember:  Follow the diet and prep instructions given to you by the office.      DO NOT take any medications for diabetes the morning of your procedure.    Take these medicines the morning of surgery with A SIP OF WATER             amlodipine, synthroid, prilosec. Zofran(if needed), flomax.     Do not wear jewelry, make-up or nail polish.  Do not wear lotions, powders, or perfumes, or deodorant.  Do not shave 48 hours prior to surgery.  Men may shave face and neck.  Do not bring valuables to the hospital.  [facility] is not responsible for any belongings or valuables.  Contacts, dentures or bridgework may not be worn into surgery.  Leave your suitcase in the car.  After surgery it may be brought to your room.  For patients admitted to the hospital, discharge time will be determined by your treatment team.  Patients discharged the day of surgery will not be allowed to drive home and must have someone with them for 24 hours.    Special instructions:   DO NOT smoke tobacco or vape for 24 hours before your procedure.  Please read over the following fact sheets that you were given. Anesthesia Post-op Instructions and Care and Recovery After Surgery      Upper Endoscopy, Adult, Care After After the procedure, it is common to have a sore throat. It is also common to have: Mild stomach pain or discomfort. Bloating. Nausea. Follow these instructions at home: The instructions below may help you care for yourself at home. Your health care provider may give you more instructions. If you have questions, ask your health care provider. If you were given a sedative during the procedure, it can affect you for several hours. Do not drive or operate machinery  until your health care provider says that it is safe. If you will be going home right after the procedure, plan to have a responsible adult: Take you home from the hospital or clinic. You will not be allowed to drive. Care for you for the time you are told. Follow instructions from your health care provider about what you may eat and drink. Return to your normal activities as told by your health care provider. Ask your health care provider what activities are safe for you. Take over-the-counter and prescription medicines only as told by your health care provider. Contact a health care provider if you: Have a sore throat that lasts longer than one day. Have trouble swallowing. Have a fever. Get help right away if you: Vomit blood or your vomit looks like coffee grounds. Have bloody, black, or tarry stools. Have a very bad sore throat or you cannot swallow. Have difficulty breathing or very bad pain in your chest or abdomen. These symptoms may be an emergency. Get help right away. Call 911. Do not wait to see if the symptoms will go away. Do not drive yourself to the hospital. Summary After the procedure, it is common to have a sore throat, mild stomach discomfort, bloating, and nausea. If you were given a sedative during the procedure, it can affect you for  several hours. Do not drive until your health care provider says that it is safe. Follow instructions from your health care provider about what you may eat and drink. Return to your normal activities as told by your health care provider. This information is not intended to replace advice given to you by your health care provider. Make sure you discuss any questions you have with your health care provider. Document Revised: 08/30/2021 Document Reviewed: 08/30/2021 Elsevier Patient Education  [place] After This sheet gives you information about how to care for yourself after your procedure.  Your health care provider may also give you more specific instructions. If you have problems or questions, contact your health care provider. What can I expect after the procedure? After the procedure, it is common to have: Tiredness. Forgetfulness about what happened after the procedure. Impaired judgment for important decisions. Nausea or vomiting. Some difficulty with balance. Follow these instructions at home: For the time period you were told by your health care provider:     Rest as needed. Do not participate in activities where you could fall or become injured. Do not drive or use machinery. Do not drink alcohol. Do not take sleeping pills or medicines that cause drowsiness. Do not make important decisions or sign legal documents. Do not take care of children on your own. Eating and drinking Follow the diet that is recommended by your health care provider. Drink enough fluid to keep your urine pale yellow. If you vomit: Drink water, juice, or soup when you can drink without vomiting. Make sure you have little or no nausea before eating solid foods. General instructions Have a responsible adult stay with you for the time you are told. It is important to have someone help care for you until you are awake and alert. Take over-the-counter and prescription medicines only as told by your health care provider. If you have sleep apnea, surgery and certain medicines can increase your risk for breathing problems. Follow instructions from your health care provider about wearing your sleep device: Anytime you are sleeping, including during daytime naps. While taking prescription pain medicines, sleeping medicines, or medicines that make you drowsy. Avoid smoking. Keep all follow-up visits as told by your health care provider. This is important. Contact a health care provider if: You keep feeling nauseous or you keep vomiting. You feel light-headed. You are still sleepy or having  trouble with balance after 24 hours. You develop a rash. You have a fever. You have redness or swelling around the IV site. Get help right away if: You have trouble breathing. You have new-onset confusion at home. Summary For several hours after your procedure, you may feel tired. You may also be forgetful and have poor judgment. Have a responsible adult stay with you for the time you are told. It is important to have someone help care for you until you are awake and alert. Rest as told. Do not drive or operate machinery. Do not drink alcohol or take sleeping pills. Get help right away if you have trouble breathing, or if you suddenly become confused. This information is not intended to replace advice given to you by your health care provider. Make sure you discuss any questions you have with your health care provider. Document Revised: 04/25/2021 Document Reviewed: 04/23/2019 Elsevier Patient Education  [place].    Esophageal Dilatation Esophageal dilatation, also called esophageal dilation, is a procedure to widen or open a blocked or  narrowed part of the esophagus. The esophagus is the part of the body that moves food and liquid from the mouth to the stomach. You may need this procedure if: You have a buildup of scar tissue in your esophagus that makes it difficult, painful, or impossible to swallow. This can be caused by gastroesophageal reflux disease (GERD). You have cancer of the esophagus. There is a problem with how food moves through your esophagus. In some cases, you may need this procedure repeated at a later time to dilate the esophagus gradually. Tell a health care provider about: Any allergies you have. All medicines you are taking, including vitamins, herbs, eye drops, creams, and over-the-counter medicines. Any problems you or family members have had with anesthetic medicines. Any blood disorders you have. Any surgeries you have had. Any medical conditions  you have. Any antibiotic medicines you are required to take before dental procedures. Whether you are pregnant or may be pregnant. What are the risks? Generally, this is a safe procedure. However, problems may occur, including: Bleeding due to a tear in the lining of the esophagus. A hole, or perforation, in the esophagus. What happens before the procedure? Ask your health care provider about: Changing or stopping your regular medicines. This is especially important if you are taking diabetes medicines or blood thinners. Taking medicines such as aspirin and ibuprofen. These medicines can thin your blood. Do not take these medicines unless your health care provider tells you to take them. Taking over-the-counter medicines, vitamins, herbs, and supplements. Follow instructions from your health care provider about eating or drinking restrictions. Plan to have a responsible adult take you home from the hospital or clinic. Plan to have a responsible adult care for you for the time you are told after you leave the hospital or clinic. This is important. What happens during the procedure? You may be given a medicine to help you relax (sedative). A numbing medicine may be sprayed into the back of your throat, or you may gargle the medicine. Your health care provider may perform the dilatation using various surgical instruments, such as: Simple dilators. This instrument is carefully placed in the esophagus to stretch it. Guided wire bougies. This involves using an endoscope to insert a wire into the esophagus. A dilator is passed over this wire to enlarge the esophagus. Then the wire is removed. Balloon dilators. An endoscope with a small balloon is inserted into the esophagus. The balloon is inflated to stretch the esophagus and open it up. The procedure may vary among health care providers and hospitals. What can I expect after the procedure? Your blood pressure, heart rate, breathing rate, and blood  oxygen level will be monitored until you leave the hospital or clinic. Your throat may feel slightly sore and numb. This will get better over time. You will not be allowed to eat or drink until your throat is no longer numb. When you are able to drink, urinate, and sit on the edge of the bed without nausea or dizziness, you may be able to return home. Follow these instructions at home: Take over-the-counter and prescription medicines only as told by your health care provider. If you were given a sedative during the procedure, it can affect you for several hours. Do not drive or operate machinery until your health care provider says that it is safe. Plan to have a responsible adult care for you for the time you are told. This is important. Follow instructions from your health care provider about  any eating or drinking restrictions. Do not use any products that contain nicotine or tobacco, such as cigarettes, e-cigarettes, and chewing tobacco. If you need help quitting, ask your health care provider. Keep all follow-up visits. This is important. Contact a health care provider if: You have a fever. You have pain that is not relieved by medicine. Get help right away if: You have chest pain. You have trouble breathing. You have trouble swallowing. You vomit blood. You have black, tarry, or bloody stools. These symptoms may represent a serious problem that is an emergency. Do not wait to see if the symptoms will go away. Get medical help right away. Call your local emergency services (911 in the [place] Do not drive yourself to the hospital. Summary Esophageal dilatation, also called esophageal dilation, is a procedure to widen or open a blocked or narrowed part of the esophagus. Plan to have a responsible adult take you home from the hospital or clinic. For this procedure, a numbing medicine may be sprayed into the back of your throat, or you may gargle the medicine. Do not drive or operate  machinery until your health care provider says that it is safe. This information is not intended to replace advice given to you by your health care provider. Make sure you discuss any questions you have with your health care provider. Document Revised: 10/07/2019 Document Reviewed: 10/07/2019 Elsevier Patient Education  [place]. Colonoscopy, Adult, Care After The following information offers guidance on how to care for yourself after your procedure. Your health care provider may also give you more specific instructions. If you have problems or questions, contact your health care provider. What can I expect after the procedure? After the procedure, it is common to have: A small amount of blood in your stool for 24 hours after the procedure. Some gas. Mild cramping or bloating of your abdomen. Follow these instructions at home: Eating and drinking  Drink enough fluid to keep your urine pale yellow. Follow instructions from your health care provider about eating or drinking restrictions. Resume your normal diet as told by your health care provider. Avoid heavy or fried foods that are hard to digest. Activity Rest as told by your health care provider. Avoid sitting for a long time without moving. Get up to take short walks every 1-2 hours. This is important to improve blood flow and breathing. Ask for help if you feel weak or unsteady. Return to your normal activities as told by your health care provider. Ask your health care provider what activities are safe for you. Managing cramping and bloating  Try walking around when you have cramps or feel bloated. If directed, apply heat to your abdomen as told by your health care provider. Use the heat source that your health care provider recommends, such as a moist heat pack or a heating pad. Place a towel between your skin and the heat source. Leave the heat on for 20-30 minutes. Remove the heat if your skin turns bright red. This is  especially important if you are unable to feel pain, heat, or cold. You have a greater risk of getting burned. General instructions If you were given a sedative during the procedure, it can affect you for several hours. Do not drive or operate machinery until your health care provider says that it is safe. For the first 24 hours after the procedure: Do not sign important documents. Do not drink alcohol. Do your regular daily activities at a slower pace than normal.  Eat soft foods that are easy to digest. Take over-the-counter and prescription medicines only as told by your health care provider. Keep all follow-up visits. This is important. Contact a health care provider if: You have blood in your stool 2-3 days after the procedure. Get help right away if: You have more than a small spotting of blood in your stool. You have large blood clots in your stool. You have swelling of your abdomen. You have nausea or vomiting. You have a fever. You have increasing pain in your abdomen that is not relieved with medicine. These symptoms may be an emergency. Get help right away. Call 911. Do not wait to see if the symptoms will go away. Do not drive yourself to the hospital. Summary After the procedure, it is common to have a small amount of blood in your stool. You may also have mild cramping and bloating of your abdomen. If you were given a sedative during the procedure, it can affect you for several hours. Do not drive or operate machinery until your health care provider says that it is safe. Get help right away if you have a lot of blood in your stool, nausea or vomiting, a fever, or increased pain in your abdomen. This information is not intended to replace advice given to you by your health care provider. Make sure you discuss any questions you have with your health care provider. Document Revised: 01/11/2021 Document Reviewed: 01/11/2021 Elsevier Patient Education  [place].

## 2022-02-22 NOTE — Progress Notes (Signed)
   02/22/22 1103  OBSTRUCTIVE SLEEP APNEA  Have you ever been diagnosed with sleep apnea through a sleep study? No  Do you snore loudly (loud enough to be heard through closed doors)?  0  Do you often feel tired, fatigued, or sleepy during the daytime (such as falling asleep during driving or talking to someone)? 0  Has anyone observed you stop breathing during your sleep? 0  Do you have, or are you being treated for high blood pressure? 1  BMI more than 35 kg/m2? 1  Age > 50 (1-yes) 1  Neck circumference greater than:Male 16 inches or larger, Male 17inches or larger? 0  Male Gender (Yes=1) 1  Obstructive Sleep Apnea Score 4  Score 5 or greater  Results sent to PCP

## 2022-02-26 ENCOUNTER — Ambulatory Visit (HOSPITAL_COMMUNITY): Payer: BC Managed Care – PPO | Admitting: Certified Registered Nurse Anesthetist

## 2022-02-26 ENCOUNTER — Encounter (HOSPITAL_COMMUNITY)
Admission: RE | Disposition: A | Discharge status: Home / Self Care | Payer: Self-pay | Source: Home / Self Care | Attending: Internal Medicine

## 2022-02-26 ENCOUNTER — Ambulatory Visit (HOSPITAL_COMMUNITY)
Admission: RE | Admit: 2022-02-26 | Discharge: 2022-02-26 | Disposition: A | Discharge status: Home / Self Care | Payer: BC Managed Care – PPO | Attending: Internal Medicine | Admitting: Internal Medicine

## 2022-02-26 ENCOUNTER — Encounter (HOSPITAL_COMMUNITY): Payer: Self-pay

## 2022-02-26 DIAGNOSIS — K2970 Gastritis, unspecified, without bleeding: Secondary | ICD-10-CM | POA: Insufficient documentation

## 2022-02-26 DIAGNOSIS — K219 Gastro-esophageal reflux disease without esophagitis: Secondary | ICD-10-CM | POA: Diagnosis not present

## 2022-02-26 DIAGNOSIS — Z09 Encounter for follow-up examination after completed treatment for conditions other than malignant neoplasm: Secondary | ICD-10-CM

## 2022-02-26 DIAGNOSIS — R1030 Lower abdominal pain, unspecified: Secondary | ICD-10-CM | POA: Insufficient documentation

## 2022-02-26 DIAGNOSIS — K648 Other hemorrhoids: Secondary | ICD-10-CM | POA: Diagnosis not present

## 2022-02-26 DIAGNOSIS — D122 Benign neoplasm of ascending colon: Secondary | ICD-10-CM | POA: Diagnosis not present

## 2022-02-26 DIAGNOSIS — J45909 Unspecified asthma, uncomplicated: Secondary | ICD-10-CM | POA: Diagnosis not present

## 2022-02-26 DIAGNOSIS — Z87891 Personal history of nicotine dependence: Secondary | ICD-10-CM | POA: Insufficient documentation

## 2022-02-26 DIAGNOSIS — I10 Essential (primary) hypertension: Secondary | ICD-10-CM | POA: Diagnosis not present

## 2022-02-26 DIAGNOSIS — K317 Polyp of stomach and duodenum: Secondary | ICD-10-CM | POA: Insufficient documentation

## 2022-02-26 DIAGNOSIS — R1013 Epigastric pain: Secondary | ICD-10-CM | POA: Diagnosis not present

## 2022-02-26 DIAGNOSIS — D123 Benign neoplasm of transverse colon: Secondary | ICD-10-CM | POA: Insufficient documentation

## 2022-02-26 DIAGNOSIS — G4730 Sleep apnea, unspecified: Secondary | ICD-10-CM | POA: Diagnosis not present

## 2022-02-26 DIAGNOSIS — Z86010 Personal history of colonic polyps: Secondary | ICD-10-CM | POA: Diagnosis not present

## 2022-02-26 DIAGNOSIS — Z1211 Encounter for screening for malignant neoplasm of colon: Secondary | ICD-10-CM | POA: Diagnosis not present

## 2022-02-26 DIAGNOSIS — E2740 Unspecified adrenocortical insufficiency: Secondary | ICD-10-CM | POA: Insufficient documentation

## 2022-02-26 DIAGNOSIS — Z6841 Body Mass Index (BMI) 40.0 and over, adult: Secondary | ICD-10-CM | POA: Insufficient documentation

## 2022-02-26 DIAGNOSIS — R1310 Dysphagia, unspecified: Secondary | ICD-10-CM | POA: Diagnosis not present

## 2022-02-26 DIAGNOSIS — F32A Depression, unspecified: Secondary | ICD-10-CM | POA: Insufficient documentation

## 2022-02-26 DIAGNOSIS — Z7984 Long term (current) use of oral hypoglycemic drugs: Secondary | ICD-10-CM | POA: Diagnosis not present

## 2022-02-26 DIAGNOSIS — E119 Type 2 diabetes mellitus without complications: Secondary | ICD-10-CM | POA: Diagnosis not present

## 2022-02-26 DIAGNOSIS — I2510 Atherosclerotic heart disease of native coronary artery without angina pectoris: Secondary | ICD-10-CM | POA: Insufficient documentation

## 2022-02-26 HISTORY — PX: COLONOSCOPY WITH PROPOFOL: SHX5780

## 2022-02-26 HISTORY — PX: ESOPHAGOGASTRODUODENOSCOPY (EGD) WITH PROPOFOL: SHX5813

## 2022-02-26 HISTORY — PX: BIOPSY: SHX5522

## 2022-02-26 HISTORY — PX: POLYPECTOMY: SHX5525

## 2022-02-26 LAB — GLUCOSE, CAPILLARY: Glucose-Capillary: 163 mg/dL — ABNORMAL HIGH (ref 70–99)

## 2022-02-26 SURGERY — COLONOSCOPY WITH PROPOFOL
Anesthesia: General

## 2022-02-26 MED ORDER — LACTATED RINGERS IV SOLN: INTRAVENOUS | Status: DC | PRN

## 2022-02-26 MED ORDER — PROPOFOL 10 MG/ML IV BOLUS
INTRAVENOUS | Status: DC | PRN
  Administered 2022-02-26: 100 mg via INTRAVENOUS
  Administered 2022-02-26: 50 mg via INTRAVENOUS

## 2022-02-26 MED ORDER — MIDAZOLAM HCL 2 MG/2ML IJ SOLN
INTRAMUSCULAR | Status: AC
  Filled 2022-02-26: qty 2

## 2022-02-26 MED ORDER — PROPOFOL 500 MG/50ML IV EMUL
INTRAVENOUS | Status: DC | PRN
  Administered 2022-02-26: 150 ug/kg/min via INTRAVENOUS

## 2022-02-26 MED ORDER — MIDAZOLAM HCL 2 MG/2ML IJ SOLN
INTRAMUSCULAR | Status: DC | PRN
  Administered 2022-02-26: 2 mg via INTRAVENOUS

## 2022-02-26 MED ORDER — LIDOCAINE HCL (CARDIAC) PF 100 MG/5ML IV SOSY
PREFILLED_SYRINGE | INTRAVENOUS | Status: DC | PRN
  Administered 2022-02-26: 50 mg via INTRAVENOUS

## 2022-02-26 NOTE — Transfer of Care (Signed)
Immediate Anesthesia Transfer of Care Note  Patient: [name] [name]  Procedure(s) Performed: COLONOSCOPY WITH PROPOFOL ESOPHAGOGASTRODUODENOSCOPY (EGD) WITH PROPOFOL BIOPSY POLYPECTOMY  Patient Location: Short Stay  Anesthesia Type:General  Level of Consciousness: drowsy  Airway & Oxygen Therapy: Patient Spontanous Breathing  Post-op Assessment: Report given to RN and Post -op Vital signs reviewed and stable  Post vital signs: Reviewed and stable  Last Vitals:  Vitals Value Taken Time  BP 131/80 02/26/22 1024  Temp 36.7 C 02/26/22 1024  Pulse 93 02/26/22 1024  Resp 20 02/26/22 1024  SpO2 94 % 02/26/22 1024    Last Pain:  Vitals:   02/26/22 1024  TempSrc: Oral  PainSc: 6          Complications: No notable events documented.

## 2022-02-26 NOTE — Anesthesia Preprocedure Evaluation (Signed)
Anesthesia Evaluation  Patient identified by MRN, date of birth, ID band Patient awake    Reviewed: Allergy & Precautions, H&P , NPO status , Patient's Chart, lab work & pertinent test results, reviewed documented beta blocker date and time   Airway Mallampati: II  TM Distance: >3 FB Neck ROM: full    Dental no notable dental hx.    Pulmonary asthma , sleep apnea , former smoker,    Pulmonary exam normal breath sounds clear to auscultation       Cardiovascular Exercise Tolerance: Good hypertension, negative cardio ROS   Rhythm:regular Rate:Normal     Neuro/Psych PSYCHIATRIC DISORDERS Depression  Neuromuscular disease    GI/Hepatic Neg liver ROS, GERD  Medicated,  Endo/Other  diabetes, Type 2Hypothyroidism Morbid obesity  Renal/GU negative Renal ROS  negative genitourinary   Musculoskeletal   Abdominal   Peds  Hematology negative hematology ROS (+)   Anesthesia Other Findings   Reproductive/Obstetrics negative OB ROS                             Anesthesia Physical Anesthesia Plan  ASA: 3  Anesthesia Plan: General   Post-op Pain Management:    Induction:   PONV Risk Score and Plan: Propofol infusion  Airway Management Planned:   Additional Equipment:   Intra-op Plan:   Post-operative Plan:   Informed Consent: I have reviewed the patients History and Physical, chart, labs and discussed the procedure including the risks, benefits and alternatives for the proposed anesthesia with the patient or authorized representative who has indicated his/her understanding and acceptance.     Dental Advisory Given  Plan Discussed with: CRNA  Anesthesia Plan Comments:         Anesthesia Quick Evaluation

## 2022-02-26 NOTE — Op Note (Signed)
[facility] Patient Name: Graysin Luczynski Procedure Date: 02/26/2022 9:40 AM MRN: [id number] Date of Birth: 1965/11/20 Attending MD: Elon Alas. Abbey Chatters DO CSN: [id number] Age: 56 Admit Type: Outpatient Procedure:                Colonoscopy Indications:              Surveillance: Personal history of adenomatous                            polyps on last colonoscopy 5 years ago, Incidental                            - Lower abdominal pain, Incidental - Chronic                            diarrhea Providers:                Elon Alas. Abbey Chatters, DO, Caprice Kluver, Ladoris Gene                            Technician, Technician, Aram Candela Referring MD:              Medicines:                See the Anesthesia note for documentation of the                            administered medications Complications:            No immediate complications. Estimated Blood Loss:     Estimated blood loss was minimal. Procedure:                Pre-Anesthesia Assessment:                           - The anesthesia plan was to use monitored                            anesthesia care (MAC).                           After obtaining informed consent, the colonoscope                            was passed under direct vision. Throughout the                            procedure, the patient's blood pressure, pulse, and                            oxygen saturations were monitored continuously. The                            [id number]-HQ190L ([id number] scope was introduced through                            the anus and advanced to the the cecum, identified  by appendiceal orifice and ileocecal valve. The                            colonoscopy was performed without difficulty. The                            patient tolerated the procedure well. The quality                            of the bowel preparation was evaluated using the                            BBPS [facility] Bowel Preparation Scale) with  scores                            of: Right Colon = 2 (minor amount of residual                            staining, small fragments of stool and/or opaque                            liquid, but mucosa seen well), Transverse Colon = 2                            (minor amount of residual staining, small fragments                            of stool and/or opaque liquid, but mucosa seen                            well) and Left Colon = 2 (minor amount of residual                            staining, small fragments of stool and/or opaque                            liquid, but mucosa seen well). The total BBPS score                            equals 6. The quality of the bowel preparation was                            fair. Scope In: 10:03:41 AM Scope Out: 10:18:51 AM Scope Withdrawal Time: 0 hours 13 minutes 47 seconds  Total Procedure Duration: 0 hours 15 minutes 10 seconds  Findings:      Large prolapsed internal hemorrhoids were found on perianal exam.      Non-bleeding internal hemorrhoids were found during endoscopy.      Three sessile polyps were found in the transverse colon and ascending       colon. The polyps were 4 to 8 mm in size. These polyps were removed with       a cold snare. Resection and retrieval were complete.  Biopsies for histology were taken with a cold forceps from the ascending       colon, transverse colon and descending colon for evaluation of       microscopic colitis. Impression:               - Preparation of the colon was fair.                           - Hemorrhoids found on perianal exam.                           - Non-bleeding internal hemorrhoids.                           - Three 4 to 8 mm polyps in the transverse colon                            and in the ascending colon, removed with a cold                            snare. Resected and retrieved.                           - Biopsies were taken with a cold forceps from the                             ascending colon, transverse colon and descending                            colon for evaluation of microscopic colitis. Moderate Sedation:      Per Anesthesia Care Recommendation:           - Patient has a contact number available for                            emergencies. The signs and symptoms of potential                            delayed complications were discussed with the                            patient. Return to normal activities tomorrow.                            Written discharge instructions were provided to the                            patient.                           - Resume previous diet.                           - Continue present medications.                           -  Await pathology results.                           - Repeat colonoscopy in 5 years for surveillance.                           - Return to GI clinic in 3 months. Procedure Code(s):        --- Professional ---                           [phone], Colonoscopy, flexible; with removal of                            tumor(s), polyp(s), or other lesion(s) by snare                            technique                           45380, 58, Colonoscopy, flexible; with biopsy,                            single or multiple Diagnosis Code(s):        --- Professional ---                           Z86.010, Personal history of colonic polyps                           K64.8, Other hemorrhoids                           K63.5, Polyp of colon CPT copyright 2019 American Medical Association. All rights reserved. The codes documented in this report are preliminary and upon coder review may  be revised to meet current compliance requirements. Elon Alas. Abbey Chatters, DO [place] Abbey Chatters, DO 02/26/2022 10:23:36 AM This report has been signed electronically. Number of Addenda: 0

## 2022-02-26 NOTE — Anesthesia Postprocedure Evaluation (Signed)
Anesthesia Post Note  Patient: [name] [name] First  Procedure(s) Performed: COLONOSCOPY WITH PROPOFOL ESOPHAGOGASTRODUODENOSCOPY (EGD) WITH PROPOFOL BIOPSY POLYPECTOMY  Patient location during evaluation: Phase II Anesthesia Type: General Level of consciousness: awake Pain management: pain level controlled Vital Signs Assessment: post-procedure vital signs reviewed and stable Respiratory status: spontaneous breathing and respiratory function stable Cardiovascular status: blood pressure returned to baseline and stable Postop Assessment: no headache and no apparent nausea or vomiting Anesthetic complications: no Comments: Late entry   No notable events documented.   Last Vitals:  Vitals:   02/26/22 0851 02/26/22 1024  BP: (!) 143/88 131/80  Pulse: 92 93  Resp: 19 20  Temp: 36.6 C 36.7 C  SpO2: 97% 94%    Last Pain:  Vitals:   02/26/22 1024  TempSrc: Oral  PainSc: [place]

## 2022-02-26 NOTE — Interval H&P Note (Signed)
History and Physical Interval Note:  02/26/2022 9:16 AM  [name] [name]  has presented today for surgery, with the diagnosis of hx polyps, choking, chest pain, abdominal pain, history colon polyps, change in bowels.  The various methods of treatment have been discussed with the patient and family. After consideration of risks, benefits and other options for treatment, the patient has consented to  Procedure(s) with comments: COLONOSCOPY WITH PROPOFOL (N/A) - 9:45am, asa 3, ASAP ESOPHAGOGASTRODUODENOSCOPY (EGD) WITH PROPOFOL (N/A) BALLOON DILATION (N/A) as a surgical intervention.  The patient's history has been reviewed, patient examined, no change in status, stable for surgery.  I have reviewed the patient's chart and labs.  Questions were answered to the patient's satisfaction.     Eloise Harman

## 2022-02-26 NOTE — Discharge Instructions (Addendum)
EGD Discharge instructions Please read the instructions outlined below and refer to this sheet in the next few weeks. These discharge instructions provide you with general information on caring for yourself after you leave the hospital. Your doctor may also give you specific instructions. While your treatment has been planned according to the most current medical practices available, unavoidable complications occasionally occur. If you have any problems or questions after discharge, please call your doctor. ACTIVITY You may resume your regular activity but move at a slower pace for the next 24 hours.  Take frequent rest periods for the next 24 hours.  Walking will help expel (get rid of) the air and reduce the bloated feeling in your abdomen.  No driving for 24 hours (because of the anesthesia (medicine) used during the test).  You may shower.  Do not sign any important legal documents or operate any machinery for 24 hours (because of the anesthesia used during the test).  NUTRITION Drink plenty of fluids.  You may resume your normal diet.  Begin with a light meal and progress to your normal diet.  Avoid alcoholic beverages for 24 hours or as instructed by your caregiver.  MEDICATIONS You may resume your normal medications unless your caregiver tells you otherwise.  WHAT YOU CAN EXPECT TODAY You may experience abdominal discomfort such as a feeling of fullness or "gas" pains.  FOLLOW-UP Your doctor will discuss the results of your test with you.  SEEK IMMEDIATE MEDICAL ATTENTION IF ANY OF THE FOLLOWING OCCUR: Excessive nausea (feeling sick to your stomach) and/or vomiting.  Severe abdominal pain and distention (swelling).  Trouble swallowing.  Temperature over 101 F (37.8 C).  Rectal bleeding or vomiting of blood.     Colonoscopy Discharge Instructions  Read the instructions outlined below and refer to this sheet in the next few weeks. These discharge instructions provide you with  general information on caring for yourself after you leave the hospital. Your doctor may also give you specific instructions. While your treatment has been planned according to the most current medical practices available, unavoidable complications occasionally occur.   ACTIVITY You may resume your regular activity, but move at a slower pace for the next 24 hours.  Take frequent rest periods for the next 24 hours.  Walking will help get rid of the air and reduce the bloated feeling in your belly (abdomen).  No driving for 24 hours (because of the medicine (anesthesia) used during the test).   Do not sign any important legal documents or operate any machinery for 24 hours (because of the anesthesia used during the test).  NUTRITION Drink plenty of fluids.  You may resume your normal diet as instructed by your doctor.  Begin with a light meal and progress to your normal diet. Heavy or fried foods are harder to digest and may make you feel sick to your stomach (nauseated).  Avoid alcoholic beverages for 24 hours or as instructed.  MEDICATIONS You may resume your normal medications unless your doctor tells you otherwise.  WHAT YOU CAN EXPECT TODAY Some feelings of bloating in the abdomen.  Passage of more gas than usual.  Spotting of blood in your stool or on the toilet paper.  IF YOU HAD POLYPS REMOVED DURING THE COLONOSCOPY: No aspirin products for 7 days or as instructed.  No alcohol for 7 days or as instructed.  Eat a soft diet for the next 24 hours.  FINDING OUT THE RESULTS OF YOUR TEST Not all test results are  available during your visit. If your test results are not back during the visit, make an appointment with your caregiver to find out the results. Do not assume everything is normal if you have not heard from your caregiver or the medical facility. It is important for you to follow up on all of your test results.  SEEK IMMEDIATE MEDICAL ATTENTION IF: You have more than a spotting of  blood in your stool.  Your belly is swollen (abdominal distention).  You are nauseated or vomiting.  You have a temperature over 101.  You have abdominal pain or discomfort that is severe or gets worse throughout the day.   Your EGD revealed mild amount inflammation in your stomach.  I took biopsies of this to rule out infection with a bacteria called H. pylori.  Await pathology results, my office will contact you.  You also had multiple polyps in your stomach, these are likely benign.  I did remove a few of largest ones.  I did not see any obvious tightening of your esophagus today so I elected not to dilate.  Continue on pantoprazole daily.  Your colonoscopy revealed 3 polyp(s) which I removed successfully. Await pathology results, my office will contact you. I recommend repeating colonoscopy in 5 years for surveillance purposes.   I also took biopsies of your colon as well given your chronically loose bowel movements.  Follow up with GI in 6-8 weeks.      I hope you have a great rest of your week!  Elon Alas. Abbey Chatters, D.O. Gastroenterology and Hepatology [facility]

## 2022-02-26 NOTE — Op Note (Signed)
[facility] Patient Name: [name] [name] Procedure Date: 02/26/2022 9:39 AM MRN: [id number] Date of Birth: Feb 24, 1966 Attending MD: Elon Alas. Abbey Chatters DO CSN: [id number] Age: 56 Admit Type: Outpatient Procedure:                Upper GI endoscopy Indications:              Epigastric abdominal pain, Dysphagia Providers:                Elon Alas. Abbey Chatters, DO, Caprice Kluver, Ladoris Gene                            Technician, Technician, Aram Candela Referring MD:              Medicines:                See the Anesthesia note for documentation of the                            administered medications Complications:            No immediate complications. Estimated Blood Loss:     Estimated blood loss was minimal. Procedure:                Pre-Anesthesia Assessment:                           - The anesthesia plan was to use monitored                            anesthesia care (MAC).                           After obtaining informed consent, the endoscope was                            passed under direct vision. Throughout the                            procedure, the patient's blood pressure, pulse, and                            oxygen saturations were monitored continuously. The                            [id number]-H190 ([id number]) scope was introduced through the                            mouth, and advanced to the second part of duodenum.                            The upper GI endoscopy was accomplished without                            difficulty. The patient tolerated the procedure                            well. Scope  In: 9:50:14 AM Scope Out: 9:58:12 AM Total Procedure Duration: 0 hours 7 minutes 58 seconds  Findings:      There is no endoscopic evidence of bleeding, areas of erosion,       esophagitis, ulcerations or varices in the entire esophagus.      Patchy mild inflammation characterized by erythema was found in the       gastric body and in the gastric antrum. Biopsies were  taken with a cold       forceps for Helicobacter pylori testing.      Multiple sessile polyps with no bleeding and no stigmata of recent       bleeding were found in the gastric body. Likely fundic gland polyps. 2       of the largest polyps removed with a cold snare. One was >1cm. Resection       and retrieval were complete. [name] net used for retrieval.      The duodenal bulb, first portion of the duodenum and second portion of       the duodenum were normal. Impression:               - Gastritis. Biopsied.                           - Multiple gastric polyps. Resected and retrieved.                           - Normal duodenal bulb, first portion of the                            duodenum and second portion of the duodenum.                           - Patient without any stricture/stenosis in entire                            esophagus. Moderate Sedation:      Per Anesthesia Care Recommendation:           - Patient has a contact number available for                            emergencies. The signs and symptoms of potential                            delayed complications were discussed with the                            patient. Return to normal activities tomorrow.                            Written discharge instructions were provided to the                            patient.                           - Resume previous diet.                           -  Continue present medications.                           - Await pathology results.                           - Use a proton pump inhibitor PO daily.                           - No ibuprofen, naproxen, or other non-steroidal                            anti-inflammatory drugs.                           - Return to GI clinic in 3 months.                           - Patient without any stricture/stenosis in entire                            esophagus. Pre procedure he denied dysphagia for me                            instead described  more epigastric discomfort with                            foods. Elected not to proceed with esophageal                            dilation today based on findings. Procedure Code(s):        --- Professional ---                           [phone], Esophagogastroduodenoscopy, flexible,                            transoral; with removal of tumor(s), polyp(s), or                            other lesion(s) by snare technique                           43239, 22, Esophagogastroduodenoscopy, flexible,                            transoral; with biopsy, single or multiple Diagnosis Code(s):        --- Professional ---                           K29.70, Gastritis, unspecified, without bleeding                           K31.7, Polyp of stomach and duodenum  R10.13, Epigastric pain                           R13.10, Dysphagia, unspecified CPT copyright 2019 American Medical Association. All rights reserved. The codes documented in this report are preliminary and upon coder review may  be revised to meet current compliance requirements. Elon Alas. Abbey Chatters, DO [place] Abbey Chatters, DO 02/26/2022 10:02:45 AM This report has been signed electronically. Number of Addenda: 0

## 2022-02-27 LAB — SURGICAL PATHOLOGY

## 2022-03-05 ENCOUNTER — Telehealth: Payer: Self-pay

## 2022-03-05 ENCOUNTER — Encounter (HOSPITAL_COMMUNITY): Payer: Self-pay | Admitting: Internal Medicine

## 2022-03-05 NOTE — Telephone Encounter (Signed)
Received a fax from the patient's wife stating the following:  [name] [name] is still sick - still vomiting - still has diarrhea. He is still taking the nausea medicine on a pretty regular basis. At this point, it seems we have only found out that he has gastritis and diverticulosis. Is this what is still causing this sickness? Is there any treatment for this? It seems like [employer], his short term disability is trying to force ya'll to release him to go back to work. I know in his last visit with Dr. Gala Romney, Dr. Gala Romney said after these last procedures, he wanted him to see his endocrinologist, Dr. Chalmers Cater. I am in the process of getting the appointment set up for him ASAP. Also, [name] [name] has Grave's Disease. Could his problems be stemming from this? We just want him to feel better before being released back to work.  If he was able to go back right now, that would be amazing because we financially need him to, but with him continuing to vomit and not being able to eat and having to take the Zofran so much - there is still a problem. Is there anything to help his stomach issues??? He has lost probably up to 50 lbs during this bout of sickness. Please call and give recommendations. Thank you!  Dr. Gala Romney originally wrote him out until November 1st due to him not being able to have a colonoscopy done until then, but then the procedure got moved up to 02/26/2022 with Dr. Abbey Chatters. The patient was to return to work the following day 02/27/2022. The work note was revised and the patient was notified along with [employer]. Please advise.

## 2022-03-05 NOTE — Telephone Encounter (Signed)
Dr. Gala Romney took him out of work initially. I filled out the short term disability forms and faxed them the requested information. I have it in a folder on my desk to be scanned into the patient's chart.

## 2022-03-05 NOTE — Telephone Encounter (Signed)
I have not seen this patient since 2018. Reviewed chart. Recent colonoscopy and upper endoscopy with no findings to explain weight loss, N/V, or diarrhea. All of his symptoms would not be explained by gastritis or diverticulosis.   He needs to get back in to see his endocrinologist.   I would recommend return ov with Dr. Gala Romney to wrap up GI work up, first available looks like maybe 03/20/22?  If he needs something before then, I would recommend virtual visit.   Tammy, I am having trouble seeing the [facility] stuff, did Dr. Gala Romney initiate that? I don't feel comfortable providing extension on someone I have not been involved with.

## 2022-03-05 NOTE — Telephone Encounter (Signed)
Can we do an extension for the STD forms until he can get back in to see Dr. Gala Romney? And provide other information below to wife. I am not sure if all of his symptoms especially the N/V are specifically GI related. Cannot rule out adrenal insufficiency or thyroid disease contributing. So it is important that he sees endocrinology.

## 2022-03-05 NOTE — Telephone Encounter (Signed)
I think he should see Dr. Gala Romney, he has availability 03/20/22 and has seen him the last two times. I haven't seen him in 5 years.

## 2022-03-05 NOTE — Telephone Encounter (Signed)
Pt was made aware and verbalized understanding. Pt was scheduled with Dr. Gala Romney on 03/20/2022 and the work note has been extended until 03/20/2022 and faxed to [employer].

## 2022-03-09 DIAGNOSIS — E119 Type 2 diabetes mellitus without complications: Secondary | ICD-10-CM | POA: Diagnosis not present

## 2022-03-09 DIAGNOSIS — E890 Postprocedural hypothyroidism: Secondary | ICD-10-CM | POA: Diagnosis not present

## 2022-03-09 DIAGNOSIS — E7800 Pure hypercholesterolemia, unspecified: Secondary | ICD-10-CM | POA: Diagnosis not present

## 2022-03-11 ENCOUNTER — Telehealth: Payer: Self-pay | Admitting: Internal Medicine

## 2022-03-11 NOTE — Telephone Encounter (Signed)
Called patient this afternoon to check on him.  I reviewed EGD and colonoscopy findings performed Dr. Abbey Chatters recently.  Patient states he has 0-1 bowel movement daily.  Not really having any diarrhea anymore;  abdominal pain is settled down.  Quite fatigued.  Stays in bed a lot.  No nausea or vomiting.  Reviewed EGD / biopsies as well as colonoscopy findings.  Agree with a repeat colonoscopy in 5 years.  No evidence of colitis or other inflammatory process to explain diarrhea although this scenario is most consistent with postinfectious IBS previous;  stool studies negative.  He does take metformin which could be a contributing factor to his recent diarrhea but again, its not an issue at this time.  GERD well-controlled on Prilosec.  Recent cardiac evaluation as chronicled in the medical record.  I told the patient at this point time, from a GI standpoint he could return to work anytime.  Fatigue is likely a separate issue.  Adrenal insufficiency may be a contributing factor.  He is seeing Dr. Soyla Murphy this coming Tuesday.  He is coming back to see me in a week.  After he sees [place] he is going back to see Dr. Hilma Favors.  At this time, no further GI evaluation warranted.

## 2022-03-12 NOTE — Telephone Encounter (Signed)
Communication noted.  

## 2022-03-13 DIAGNOSIS — E890 Postprocedural hypothyroidism: Secondary | ICD-10-CM | POA: Diagnosis not present

## 2022-03-13 DIAGNOSIS — E1165 Type 2 diabetes mellitus with hyperglycemia: Secondary | ICD-10-CM | POA: Diagnosis not present

## 2022-03-13 DIAGNOSIS — E2749 Other adrenocortical insufficiency: Secondary | ICD-10-CM | POA: Diagnosis not present

## 2022-03-13 DIAGNOSIS — E119 Type 2 diabetes mellitus without complications: Secondary | ICD-10-CM | POA: Diagnosis not present

## 2022-03-20 ENCOUNTER — Ambulatory Visit: Payer: BC Managed Care – PPO | Admitting: Internal Medicine

## 2022-03-22 ENCOUNTER — Ambulatory Visit (INDEPENDENT_AMBULATORY_CARE_PROVIDER_SITE_OTHER): Payer: BC Managed Care – PPO | Admitting: Internal Medicine

## 2022-03-22 ENCOUNTER — Encounter: Payer: Self-pay | Admitting: Internal Medicine

## 2022-03-22 VITALS — BP 104/70 | HR 102 | Temp 97.2°F | Ht 67.0 in | Wt 278.8 lb

## 2022-03-22 DIAGNOSIS — R197 Diarrhea, unspecified: Secondary | ICD-10-CM

## 2022-03-22 DIAGNOSIS — K219 Gastro-esophageal reflux disease without esophagitis: Secondary | ICD-10-CM

## 2022-03-22 DIAGNOSIS — R112 Nausea with vomiting, unspecified: Secondary | ICD-10-CM

## 2022-03-22 NOTE — Patient Instructions (Signed)
It was good seeing you again today!  By your report, you have too much thyroid in your bloodstream.  Cutting back on your thyroid supplement will help you tremendously and could be very helpful for your GI symptoms.  With your thyroid off and hemoglobin A1c in the  8-10 range, you could have a delay in stomach emptying.  As far as diarrhea is concerned you likely have an element of irritable bowel syndrome.  Getting your blood sugars under better control (hemoglobin A1c is back in the 6- 7 range ) and normal thyroid levels will go a long way in helping all of your GI symptoms  In addition, if your cortisol is low that could be a major issue as well with your symptoms. For now, no further GI testing is recommended.  You are not restricted in any way from a GI standpoint  "Lazy stomach" diet as discussed.  As discussed, instead of 3 meals daily, go with 5-6 smaller meals daily (a low-fat low fiber)  May use Imodium up to 4 times a day as needed for loose stools  Continue omeprazole 40 mg daily.  By all means, follow-up with [name] [name] regarding your cortisol, etc.  We will see you back in the office here in 3 months.

## 2022-03-22 NOTE — Progress Notes (Signed)
Primary Care Physician:  Sharilyn Sites, MD Primary Gastroenterologist:  Dr. Gala Romney  Pre-Procedure History & Physical: HPI:  [name] [name] is a 56 y.o. male here for follow-up of nausea, vomiting, diarrhea.  Intermittent symptoms of nausea and vomiting without abdominal pain -  intermittent diarrhea punctuated by periods of normal bowel function.  History of hypothyroidism on supplement and history of adrenal insufficiency.  Poorly controlled diabetes  Hemoglobin A1c is recently 8-10 range.  Saw endocrinologist recently who found that he was hyperthyroid thyroid  - supplement has been decreased (labs not available they are out of system) also cortisol levels pending.  Again, no abdominal pain.  He is more or less able to eat although he does have intermittent episodes of nausea which are being controlled with Zofran.  Recent EGD and colonoscopy as chronicled in the medical record review reviewed with the patient today.  History of colon polyps; due for surveillance colonoscopy 2027.  Past Medical History:  Diagnosis Date   Arthritis    Asthma    as child   Back pain, chronic 02/18/11   BULGING DISK   Complication of anesthesia    "woke up during spleenectomy at [facility]".   Diabetes mellitus without complication (HCC)    GERD (gastroesophageal reflux disease)    erosive reflux esophagitis, small HH   Hemorrhoids    History of kidney stones    Hyperlipemia    Hypertension    Hypothyroidism    IBS (irritable bowel syndrome)    Low serum cortisol level    Dr Suzette Battiest   Lower extremity edema    Nephrolithiasis 12/19/2012   Plantar fasciitis    Restless leg syndrome    S/P colonoscopy 08/2008   int/ext hemorrhoids. friable, rectal polyp   S/P endoscopy 01/10/09   Dr Vivi Ferns, small [facility]   Sleep apnea    cpap   Spinal stenosis of lumbar region 12/18/2012   Thyroid disease    Tubular adenoma of colon 2000   UTI (lower urinary tract infection)     Past Surgical History:  Procedure  Laterality Date   BIOPSY  02/26/2022   Procedure: BIOPSY;  Surgeon: Eloise Harman, DO;  [facility];  Service: Endoscopy;;   CHOLECYSTECTOMY  2006   COLONOSCOPY  2000   Dr. Gala Romney- tubular adenoma   COLONOSCOPY  08/10/2008   Dr. Gala Romney-  IMPRESSION:  Prominent external anal canal hemorrhoids, hyperplastic polyp   COLONOSCOPY N/A 10/08/2013   [id number] colonic polyps -  removed as described above/Prominent grade 3 hemorrhoids -  likely source of hematochezia. tubular adenomas. next TCS 10/2016   COLONOSCOPY WITH PROPOFOL N/A 02/25/2017   Procedure: COLONOSCOPY WITH PROPOFOL;  Surgeon: Daneil Dolin, MD;  Grade 4 internal hemorrhoids, 1 tubular adenoma, recommendations to repeat in 5 years.    COLONOSCOPY WITH PROPOFOL N/A 02/26/2022   Procedure: COLONOSCOPY WITH PROPOFOL;  Surgeon: Eloise Harman, DO;  [facility]: [facility];  Service: Endoscopy;  Laterality: N/A;  9:45am, asa 3, ASAP   CYSTOSCOPY     with utetheral dilation   DISTAL BICEPS TENDON REPAIR  02/20/2012   Procedure: DISTAL BICEPS TENDON REPAIR;  Surgeon: Lorn Junes, MD;  Location: [place];  Service: Orthopedics;  Laterality: Left;  left elbow distal biceps tendon repair   ESOPHAGOGASTRODUODENOSCOPY  2010   Dr. Gala Romney: normal   ESOPHAGOGASTRODUODENOSCOPY (EGD) WITH PROPOFOL N/A 02/26/2022   Procedure: ESOPHAGOGASTRODUODENOSCOPY (EGD) WITH PROPOFOL;  Surgeon: Eloise Harman, DO;  [facility];  Service: Endoscopy;  Laterality: N/A;   FLEXIBLE SIGMOIDOSCOPY N/A 11/10/2013   Dr.Demitrious Mccannon- internal/external hemorrhoids. external hemorrhoids grade 3 with some residual prolapsing tissue. normal rectum aside form hemorrhoids. s/p band placement.   HEMORRHOID BANDING N/A 11/10/2013   Procedure: HEMORRHOID BANDING;  Surgeon: Daneil Dolin, MD;  [facility];  Service: Endoscopy;  Laterality: N/A;   HERNIA REPAIR     INCISIONAL HERNIA REPAIR N/A 04/10/2017   Procedure: INCISIONAL  HERNIORRHAPHY WITH MESH;  Surgeon: Aviva Signs, MD;  Location: [facility];  Service: General;  Laterality: N/A;   KNEE SURGERY     left   POLYPECTOMY  02/25/2017   Procedure: POLYPECTOMY - ASCENDING COLON;  Surgeon: Daneil Dolin, MD;  [facility];  Service: Endoscopy;;   POLYPECTOMY  02/26/2022   Procedure: POLYPECTOMY;  Surgeon: Eloise Harman, DO;  [facility];  Service: Endoscopy;;  gastric ; colon   SPLENECTOMY  2003   itp    Prior to Admission medications   Medication Sig Start Date End Date Taking? Authorizing Provider  amLODipine (NORVASC) 10 MG tablet Take 1 tablet (10 mg total) by mouth daily. 01/05/22  Yes Johnson, Clanford L, MD  blood glucose meter kit and supplies Dispense based on patient and insurance preference. Use up to four times daily as directed. (FOR ICD-10 E10.9, E11.9). 01/05/22  Yes Johnson, Clanford L, MD  cholecalciferol (VITAMIN D3) 25 MCG (1000 UNIT) tablet Take 1,000 Units by mouth daily.   Yes [provider]  DULoxetine (CYMBALTA) 60 MG capsule Take 120 mg by mouth at bedtime.    Yes [provider]  glimepiride (AMARYL) 4 MG tablet Take 4 mg by mouth 2 (two) times daily. 12/22/21  Yes [provider]  hydrocortisone (CORTEF) 20 MG tablet Take 1 tablet (20 mg total) by mouth daily. For cortisol replacement 01/05/22  Yes Johnson, Clanford L, MD  levothyroxine (SYNTHROID) 137 MCG tablet Take 137 mcg by mouth daily before breakfast.   Yes [provider]  losartan (COZAAR) 100 MG tablet Take 100 mg by mouth daily. 04/01/17  Yes [provider]  metFORMIN (GLUCOPHAGE) 1000 MG tablet Take 1,000 mg by mouth 2 (two) times daily. 10/21/17  Yes [provider]  Multiple Vitamins-Minerals (MENS MULTIVITAMIN PLUS) TABS Take 1 tablet by mouth daily. /For nutritional supplementation. 08/23/11  Yes Darrol Jump, MD  Omega-3 Fatty Acids (OMEGA-3 FISH OIL PO) Take 1 capsule by mouth at bedtime.    Yes  [provider]  omeprazole (PRILOSEC) 40 MG capsule Take 1 capsule (40 mg total) by mouth 2 (two) times daily. 01/05/22  Yes Johnson, Clanford L, MD  ondansetron (ZOFRAN-ODT) 4 MG disintegrating tablet TAKE 1 TABLET BY MOUTH EVERY 8 HOURS AS NEEDED FOR NAUSEA OR VOMITING. 02/20/22  Yes Saahas Hidrogo, Cristopher Estimable, MD  rosuvastatin (CRESTOR) 20 MG tablet Take 1 tablet (20 mg total) by mouth daily. 01/17/22  Yes Swinyer, Lanice Schwab, NP  sitaGLIPtin (JANUVIA) 100 MG tablet Take 100 mg by mouth at bedtime.    Yes [provider]  tamsulosin (FLOMAX) 0.4 MG CAPS capsule Take 0.4 mg by mouth in the morning.    Yes [provider]  levothyroxine (SYNTHROID) 150 MCG tablet Take 150 mcg by mouth every morning. Patient not taking: Reported on 03/22/2022 09/30/19   [provider]    Allergies as of 03/22/2022 - Review Complete 03/22/2022  Allergen Reaction Noted   Dilaudid [hydromorphone] Other (See Comments)  01/03/2022   Pregabalin  12/09/2020    Family History  Problem Relation Age of Onset   Colon cancer Father 13       deceased 84 months after diagnosis   Diabetes Brother     Social History   Socioeconomic History   Marital status: Married    Spouse name: Not on file   Number of children: 1   Years of education: Not on file   Highest education level: Not on file  Occupational History   Occupation: truck driver-local     Employer: [employer]  Tobacco Use   Smoking status: Former    Packs/day: 0.50    Years: 30.00    Total pack years: 15.00    Types: Cigarettes    Quit date: 03/05/2007    Years since quitting: 15.0   Smokeless tobacco: Never  Vaping Use   Vaping Use: Never used  Substance and Sexual Activity   Alcohol use: No    Comment: 2008   Drug use: No    Comment: 2008   Sexual activity: Yes  Other Topics Concern   Not on file  Social History Narrative   1 healthy son   Social Determinants of Health   Financial Resource Strain: Not on file   Food Insecurity: Not on file  Transportation Needs: Not on file  Physical Activity: Not on file  Stress: Not on file  Social Connections: Not on file  Intimate Partner Violence: Not on file    Review of Systems: See HPI, otherwise negative ROS  Physical Exam: BP 104/70   Pulse (!) 102   Temp (!) 97.2 F (36.2 C)   Ht 5' 7"  (1.702 m)   Wt 278 lb 12.8 oz (126.5 kg)   BMI 43.67 kg/m  General:   Alert,  Well-developed, well-nourished, pleasant and cooperative in NAD; accompanied by spouse. Neck:  Supple; no masses or thyromegaly. No significant cervical adenopathy. Lungs:  Clear throughout to auscultation.   No wheezes, crackles, or rhonchi. No acute distress. Heart:  Regular rate and rhythm; no murmurs, clicks, rubs,  or gallops. Abdomen: Obese.  Positive bowel sounds.  Soft nontender ; no mass organomegaly.   pulses:  Normal pulses noted. Extremities:  Without clubbing or edema.  Impression/Plan: 56 year old gentleman multiple comorbidities including very poorly controlled diabetes,  diarrhea illness earlier in the year for which he was hospitalized.  Stool studies negative for infection.  Pneumonia earlier in the year treated with antibiotics.  Noncardiac chest pain with negative cardiac work-up.  Patient has had some lingering intermittent nausea and vomiting for which he takes Zofran.  Also, intermittent postprandial diarrhea punctuated by periods of normal bowel function.  At this point, I suspect he has thyroid derangement and very poorly controlled diabetes (hemoglobin A1c is 8-10 range) as a major culprit contributing to his nausea with baseline IBS also a factor.  Moreover, at this time is unknown to me whether or not he has normal adrenal function.  That possibility is being evaluated by his endocrinologist.  From a GI standpoint, I do not see any disability at this point in time.  If his nausea persist down the the line with his hemoglobin A1c's improved, then I would  consider solid-phase gastric emptying study but not now.  Recommendations:  Get blood sugars under better control (hemoglobin A1c is back in the 6- 7 range ) and the euthyroid state will go a long way in helping all of his GI symptoms  In addition, if  cortisol is low that could be a major issue as well with symptoms.  For now, no further GI testing is recommended.  No work restriction from a GI standpoint  May use Imodium up to 4 times a day as needed for loose stools  Continue omeprazole 40 mg daily.  By all means, follow-up with Dr. Suzette Battiest regarding endocrinology management issues.  Office visit in 3 months.         Notice: This dictation was prepared with Dragon dictation along with smaller phrase technology. Any transcriptional errors that result from this process are unintentional and may not be corrected upon review.

## 2022-03-26 DIAGNOSIS — E2749 Other adrenocortical insufficiency: Secondary | ICD-10-CM | POA: Diagnosis not present

## 2022-03-27 ENCOUNTER — Other Ambulatory Visit (HOSPITAL_COMMUNITY): Payer: BC Managed Care – PPO

## 2022-03-29 ENCOUNTER — Ambulatory Visit (HOSPITAL_COMMUNITY): Admit: 2022-03-29 | Payer: BC Managed Care – PPO | Admitting: Internal Medicine

## 2022-03-29 ENCOUNTER — Encounter (HOSPITAL_COMMUNITY): Payer: Self-pay

## 2022-03-29 SURGERY — COLONOSCOPY WITH PROPOFOL
Anesthesia: Monitor Anesthesia Care

## 2022-03-30 NOTE — Progress Notes (Deleted)
CARDIOLOGY CONSULT NOTE       Patient ID: [name] [name] MRN: [id number] DOB/AGE: 08-28-1965 56 y.o.  Admit date: (Not on file) Referring Physician: Hilma Favors Primary Physician: Sharilyn Sites, MD Primary Cardiologist: New to me Reason for Consultation: Chest pain  Active Problems:   * No active hospital problems. *   HPI:  56 y.o. previously seen by Dr Johney Frame and Marisue Ivan. History of HTN, HLD, OSA on CPAP, DM-2 and morbid obesity seen at [facility] for dyspnea and chest pain. Pain atypical sharp worse on right side Dyspnea worse and fatigue R/O BNP 15 CXR NAD ECG not acute NSR PAC LAFB poor R wave progression   TTE 01/04/22 EF 55-60% mild LVH no valve dx Aorta 3.8 cm Cardiac CTA 01/05/22 calcium score 144 84 th percentile CAD RADS 1 non obstructive CAD   He was also noted to have gastritis by CT with gastric wall thickening  GI Dr Abbey Chatters EGD gastritis Oran Rein polyps biopsied Also had colonic polyps removed Rx with Prilosec ***  ROS All other systems reviewed and negative except as noted above  Past Medical History:  Diagnosis Date   Arthritis    Asthma    as child   Back pain, chronic 02/18/11   BULGING DISK   Complication of anesthesia    "woke up during spleenectomy at [facility]".   Diabetes mellitus without complication (HCC)    GERD (gastroesophageal reflux disease)    erosive reflux esophagitis, small HH   Hemorrhoids    History of kidney stones    Hyperlipemia    Hypertension    Hypothyroidism    IBS (irritable bowel syndrome)    Low serum cortisol level    Dr Suzette Battiest   Lower extremity edema    Nephrolithiasis 12/19/2012   Plantar fasciitis    Restless leg syndrome    S/P colonoscopy 08/2008   int/ext hemorrhoids. friable, rectal polyp   S/P endoscopy 01/10/09   Dr Vivi Ferns, small [facility]   Sleep apnea    cpap   Spinal stenosis of lumbar region 12/18/2012   Thyroid disease    Tubular adenoma of colon 2000   UTI (lower urinary tract infection)     Family History  Problem  Relation Age of Onset   Colon cancer Father 16       deceased 6 months after diagnosis   Diabetes Brother     Social History   Socioeconomic History   Marital status: Married    Spouse name: Not on file   Number of children: 1   Years of education: Not on file   Highest education level: Not on file  Occupational History   Occupation: truck Aeronautical engineer: [employer]  Tobacco Use   Smoking status: Former    Packs/day: 0.50    Years: 30.00    Total pack years: 15.00    Types: Cigarettes    Quit date: 03/05/2007    Years since quitting: 15.0   Smokeless tobacco: Never  Vaping Use   Vaping Use: Never used  Substance and Sexual Activity   Alcohol use: No    Comment: 2008   Drug use: No    Comment: 2008   Sexual activity: Yes  Other Topics Concern   Not on file  Social History Narrative   1 healthy son   Social Determinants of Health   Financial Resource Strain: Not on file  Food Insecurity: Not on file  Transportation Needs: Not on file  Physical Activity:  Not on file  Stress: Not on file  Social Connections: Not on file  Intimate Partner Violence: Not on file    Past Surgical History:  Procedure Laterality Date   BIOPSY  02/26/2022   Procedure: BIOPSY;  Surgeon: Eloise Harman, DO;  [facility];  Service: Endoscopy;;   CHOLECYSTECTOMY  2006   COLONOSCOPY  2000   Dr. Gala Romney- tubular adenoma   COLONOSCOPY  08/10/2008   Dr. Gala Romney-  IMPRESSION:  Prominent external anal canal hemorrhoids, hyperplastic polyp   COLONOSCOPY N/A 10/08/2013   [id number] colonic polyps -  removed as described above/Prominent grade 3 hemorrhoids -  likely source of hematochezia. tubular adenomas. next TCS 10/2016   COLONOSCOPY WITH PROPOFOL N/A 02/25/2017   Procedure: COLONOSCOPY WITH PROPOFOL;  Surgeon: Daneil Dolin, MD;  Grade 4 internal hemorrhoids, 1 tubular adenoma, recommendations to repeat in 5 years.    COLONOSCOPY WITH PROPOFOL N/A 02/26/2022   Procedure:  COLONOSCOPY WITH PROPOFOL;  Surgeon: Eloise Harman, DO;  [facility];  Service: Endoscopy;  Laterality: N/A;  9:45am, asa 3, ASAP   CYSTOSCOPY     with utetheral dilation   DISTAL BICEPS TENDON REPAIR  02/20/2012   Procedure: DISTAL BICEPS TENDON REPAIR;  Surgeon: Lorn Junes, MD;  Location: [place];  Service: Orthopedics;  Laterality: Left;  left elbow distal biceps tendon repair   ESOPHAGOGASTRODUODENOSCOPY  2010   Dr. Gala Romney: normal   ESOPHAGOGASTRODUODENOSCOPY (EGD) WITH PROPOFOL N/A 02/26/2022   Procedure: ESOPHAGOGASTRODUODENOSCOPY (EGD) WITH PROPOFOL;  Surgeon: Eloise Harman, DO;  [facility];  Service: Endoscopy;  Laterality: N/A;   FLEXIBLE SIGMOIDOSCOPY N/A 11/10/2013   Dr.Rourk- internal/external hemorrhoids. external hemorrhoids grade 3 with some residual prolapsing tissue. normal rectum aside form hemorrhoids. s/p band placement.   HEMORRHOID BANDING N/A 11/10/2013   Procedure: HEMORRHOID BANDING;  Surgeon: Daneil Dolin, MD;  [facility];  Service: Endoscopy;  Laterality: N/A;   HERNIA REPAIR     INCISIONAL HERNIA REPAIR N/A 04/10/2017   Procedure: INCISIONAL HERNIORRHAPHY WITH MESH;  Surgeon: Aviva Signs, MD;  Location: [facility];  Service: General;  Laterality: N/A;   KNEE SURGERY     left   POLYPECTOMY  02/25/2017   Procedure: POLYPECTOMY - ASCENDING COLON;  Surgeon: Daneil Dolin, MD;  [facility];  Service: Endoscopy;;   POLYPECTOMY  02/26/2022   Procedure: POLYPECTOMY;  Surgeon: Eloise Harman, DO;  [facility];  Service: Endoscopy;;  gastric ; colon   SPLENECTOMY  2003   itp      Current Outpatient Medications:    amLODipine (NORVASC) 10 MG tablet, Take 1 tablet (10 mg total) by mouth daily., Disp: 30 tablet, Rfl: 1   blood glucose meter kit and supplies, Dispense based on patient and insurance preference. Use up to four times daily as directed. (FOR ICD-10 E10.9, E11.9)., Disp: 1  each, Rfl: 0   cholecalciferol (VITAMIN D3) 25 MCG (1000 UNIT) tablet, Take 1,000 Units by mouth daily., Disp: , Rfl:    DULoxetine (CYMBALTA) 60 MG capsule, Take 120 mg by mouth at bedtime. , Disp: , Rfl:    glimepiride (AMARYL) 4 MG tablet, Take 4 mg by mouth 2 (two) times daily., Disp: , Rfl:    hydrocortisone (CORTEF) 20 MG tablet, Take 1 tablet (20 mg total) by mouth daily. For cortisol replacement, Disp: , Rfl:    levothyroxine (SYNTHROID) 137 MCG tablet, Take 137 mcg by mouth daily  before breakfast., Disp: , Rfl:    levothyroxine (SYNTHROID) 150 MCG tablet, Take 150 mcg by mouth every morning. (Patient not taking: Reported on 03/22/2022), Disp: , Rfl:    losartan (COZAAR) 100 MG tablet, Take 100 mg by mouth daily., Disp: , Rfl: 0   metFORMIN (GLUCOPHAGE) 1000 MG tablet, Take 1,000 mg by mouth 2 (two) times daily., Disp: , Rfl: 5   Multiple Vitamins-Minerals (MENS MULTIVITAMIN PLUS) TABS, Take 1 tablet by mouth daily. /For nutritional supplementation., Disp: 30 tablet, Rfl: 0   Omega-3 Fatty Acids (OMEGA-3 FISH OIL PO), Take 1 capsule by mouth at bedtime. , Disp: , Rfl:    omeprazole (PRILOSEC) 40 MG capsule, Take 1 capsule (40 mg total) by mouth 2 (two) times daily., Disp: 60 capsule, Rfl: 1   ondansetron (ZOFRAN-ODT) 4 MG disintegrating tablet, TAKE 1 TABLET BY MOUTH EVERY 8 HOURS AS NEEDED FOR NAUSEA OR VOMITING., Disp: 20 tablet, Rfl: 2   rosuvastatin (CRESTOR) 20 MG tablet, Take 1 tablet (20 mg total) by mouth daily., Disp: 90 tablet, Rfl: 3   sitaGLIPtin (JANUVIA) 100 MG tablet, Take 100 mg by mouth at bedtime. , Disp: , Rfl:    tamsulosin (FLOMAX) 0.4 MG CAPS capsule, Take 0.4 mg by mouth in the morning. , Disp: , Rfl:     Physical Exam: There were no vitals taken for this visit.    Obese white male Lungs clear Distant heart sounds No bruit Abdomen benign Trace edema Palpable pedal pulses Neuro non focal   Labs:   Lab Results  Component Value Date   WBC 11.2 (H)  01/03/2022   HGB 13.8 01/03/2022   HCT 42.3 01/03/2022   MCV 80.6 01/03/2022   PLT 481 (H) 01/03/2022   No results for input(s): "NA", "K", "CL", "CO2", "BUN", "CREATININE", "CALCIUM", "PROT", "BILITOT", "ALKPHOS", "ALT", "AST", "GLUCOSE" in the last 168 hours.  Invalid input(s): "LABALBU" Lab Results  Component Value Date   CKTOTAL 126 12/17/2006   CKMB 1.4 12/17/2006   TROPONINI <0.30 12/17/2012    Lab Results  Component Value Date   CHOL 180 01/04/2022   CHOL 167 01/03/2022   CHOL (H) 12/17/2006    263        ATP III CLASSIFICATION:  <200     mg/dL   Desirable  200-239  mg/dL   Borderline High  >=240    mg/dL   High   Lab Results  Component Value Date   HDL 33 (L) 01/04/2022   HDL 32 (L) 01/03/2022   HDL 26 (L) 12/17/2006   Lab Results  Component Value Date   LDLCALC 80 01/04/2022   LDLCALC 63 01/03/2022   LDLCALC (H) 12/17/2006    176        Total Cholesterol/HDL:CHD Risk Coronary Heart Disease Risk Table                     Men   Women  1/2 Average Risk   3.4   3.3   Lab Results  Component Value Date   TRIG 333 (H) 01/04/2022   TRIG 359 (H) 01/03/2022   TRIG 307 (H) 12/17/2006   Lab Results  Component Value Date   CHOLHDL 5.5 01/04/2022   CHOLHDL 5.2 01/03/2022   CHOLHDL 10.1 12/17/2006   No results found for: "LDLDIRECT"    Radiology: No results found.  EKG: see HPI   ASSESSMENT AND PLAN:   Chest Pain:  atypical normal EF by TTE no RWMA;s R/o ECG non  acute cardiac CT 01/05/22 CAD RADS 1 non osbstuctive CAD Continue ASA/statin Dyspnea related to obesity BNP normal EF normal by Echo CXR NAD Gastritis:  f/u GI continue prilosec HLD continue statin  Thyroid:  on synthroid replacement TSH normal  DM:  Discussed low carb diet.  Target hemoglobin A1c is 6.5 or less.  Continue current medications.  F/U Cardiology PRN  Signed: Jenkins Rouge 03/30/2022, 2:44 PM

## 2022-04-09 ENCOUNTER — Ambulatory Visit: Payer: BC Managed Care – PPO | Admitting: Cardiovascular Disease

## 2022-04-16 ENCOUNTER — Ambulatory Visit: Payer: BC Managed Care – PPO | Admitting: Gastroenterology

## 2022-04-21 ENCOUNTER — Other Ambulatory Visit: Payer: Self-pay | Admitting: Internal Medicine

## 2022-05-09 DIAGNOSIS — Z6841 Body Mass Index (BMI) 40.0 and over, adult: Secondary | ICD-10-CM | POA: Diagnosis not present

## 2022-05-09 DIAGNOSIS — K589 Irritable bowel syndrome without diarrhea: Secondary | ICD-10-CM | POA: Diagnosis not present

## 2022-05-20 ENCOUNTER — Ambulatory Visit (INDEPENDENT_AMBULATORY_CARE_PROVIDER_SITE_OTHER): Payer: BC Managed Care – PPO

## 2022-05-20 ENCOUNTER — Other Ambulatory Visit: Payer: Self-pay | Admitting: Family Medicine

## 2022-05-20 ENCOUNTER — Ambulatory Visit
Admission: EM | Admit: 2022-05-20 | Discharge: 2022-05-20 | Disposition: A | Discharge status: Home / Self Care | Payer: BC Managed Care – PPO | Attending: Family Medicine | Admitting: Family Medicine

## 2022-05-20 DIAGNOSIS — J22 Unspecified acute lower respiratory infection: Secondary | ICD-10-CM | POA: Insufficient documentation

## 2022-05-20 DIAGNOSIS — R042 Hemoptysis: Secondary | ICD-10-CM | POA: Insufficient documentation

## 2022-05-20 DIAGNOSIS — Z1152 Encounter for screening for COVID-19: Secondary | ICD-10-CM | POA: Insufficient documentation

## 2022-05-20 DIAGNOSIS — R059 Cough, unspecified: Secondary | ICD-10-CM | POA: Diagnosis not present

## 2022-05-20 DIAGNOSIS — J4521 Mild intermittent asthma with (acute) exacerbation: Secondary | ICD-10-CM | POA: Diagnosis not present

## 2022-05-20 MED ORDER — PROMETHAZINE-DM 6.25-15 MG/5ML PO SYRP: 5.0000 mL | ORAL_SOLUTION | Freq: Four times a day (QID) | ORAL | 0 refills | Status: DC | PRN

## 2022-05-20 MED ORDER — IPRATROPIUM-ALBUTEROL 0.5-2.5 (3) MG/3ML IN SOLN
3.0000 mL | Freq: Once | RESPIRATORY_TRACT | Status: AC
  Administered 2022-05-20: 3 mL via RESPIRATORY_TRACT

## 2022-05-20 MED ORDER — BUDESONIDE-FORMOTEROL FUMARATE 160-4.5 MCG/ACT IN AERO: 2.0000 | INHALATION_SPRAY | Freq: Two times a day (BID) | RESPIRATORY_TRACT | 0 refills | Status: DC

## 2022-05-20 MED ORDER — AZITHROMYCIN 250 MG PO TABS: ORAL_TABLET | ORAL | 0 refills | Status: DC

## 2022-05-20 MED ORDER — ALBUTEROL SULFATE HFA 108 (90 BASE) MCG/ACT IN AERS: 2.0000 | INHALATION_SPRAY | RESPIRATORY_TRACT | 0 refills | Status: DC | PRN

## 2022-05-20 MED ORDER — GUAIFENESIN ER 600 MG PO TB12: 600.0000 mg | ORAL_TABLET | Freq: Two times a day (BID) | ORAL | 0 refills | Status: DC

## 2022-05-20 NOTE — ED Triage Notes (Signed)
Pt states he has been coughing up blood and green/yellow mucous when he coughs. Not coughing up large amounts of blood, small dime size of mucous/blood when coughs. He reports fever 100 at home yesterday, chest congestion, SOB, runny nose. Taking tylenol and cold medication. Been exposed to someone who was positive for Covid 2 weeks ago.

## 2022-05-21 NOTE — Telephone Encounter (Signed)
Requested medication (s) are due for refill today - no  Requested medication (s) are on the active medication list -yes  Future visit scheduled -no  Last refill: 05/20/22  Notes to clinic: Pharmacy request: alternative Rx  Requested Prescriptions  Pending Prescriptions Disp Refills   levalbuterol (XOPENEX HFA) 45 MCG/ACT inhaler [Pharmacy Med Name: LEVALBUTEROL TAR HFA 45MCG INH]  0     There is no refill protocol information for this order       Requested Prescriptions  Pending Prescriptions Disp Refills   levalbuterol (XOPENEX HFA) 45 MCG/ACT inhaler [Pharmacy Med Name: LEVALBUTEROL TAR HFA 45MCG INH]  0     There is no refill protocol information for this order

## 2022-05-22 LAB — RESP PANEL BY RT-PCR (FLU A&B, COVID) ARPGX2
Influenza A by PCR: POSITIVE — AB
Influenza B by PCR: NEGATIVE
SARS Coronavirus 2 by RT PCR: NEGATIVE

## 2022-05-24 NOTE — ED Provider Notes (Signed)
[facility]    CSN: [id number] Arrival date & time: 05/20/22  1403      History   Chief Complaint Chief Complaint  Patient presents with   Cough    HPI KIMARION CHERY is a 56 y.o. male.   Patient presenting today with several day history of productive cough of green and bloody sputum, fever, chest congestion, shortness of breath, chest tightness, wheezing, runny nose, scratchy throat.  Denies chest pain, abdominal pain, nausea vomiting or diarrhea.  Taking cold and congestion medications, Tylenol and albuterol with minimal relief.  History of asthma and former cigarette smoker.  Recent exposure to COVID-19.    Past Medical History:  Diagnosis Date   Arthritis    Asthma    as child   Back pain, chronic 02/18/11   BULGING DISK   Complication of anesthesia    "woke up during spleenectomy at [facility]".   Diabetes mellitus without complication (HCC)    GERD (gastroesophageal reflux disease)    erosive reflux esophagitis, small HH   Hemorrhoids    History of kidney stones    Hyperlipemia    Hypertension    Hypothyroidism    IBS (irritable bowel syndrome)    Low serum cortisol level    Dr Suzette Battiest   Lower extremity edema    Nephrolithiasis 12/19/2012   Plantar fasciitis    Restless leg syndrome    S/P colonoscopy 08/2008   int/ext hemorrhoids. friable, rectal polyp   S/P endoscopy 01/10/09   Dr Vivi Ferns, small [facility]   Sleep apnea    cpap   Spinal stenosis of lumbar region 12/18/2012   Thyroid disease    Tubular adenoma of colon 2000   UTI (lower urinary tract infection)     Patient Active Problem List   Diagnosis Date Noted   Non-toxic goiter 01/16/2022   Postoperative hypothyroidism 01/16/2022   Pure hypercholesterolemia 01/16/2022   Testicular hypofunction 01/16/2022   Type 2 diabetes mellitus without complications ([place]) 00/92/3300   Adrenal cortical hypofunction ([place]) 01/16/2022   Chest pain 01/03/2022   Obesity, Class III, BMI 40-49.9 (morbid  obesity) ([place]) 01/03/2022   Essential hypertension 01/03/2022   Uncontrolled type 2 diabetes mellitus with hyperglycemia ([place]) 10/26/2019   Hyperglycemia due to diabetes mellitus ([place]) 10/25/2019   Generalized weakness 10/25/2019   Urinary tract infection 10/24/2019   Rectal bleeding 02/07/2017   Abdominal hernia 02/07/2017   FHx: colon cancer 02/07/2017   Hemorrhoids, complicated 76/22/6333   Nephrolithiasis 12/19/2012   Spinal stenosis of lumbar region 12/18/2012   RBBB 12/17/2012   Hypothyroidism 12/17/2012   Adrenal insufficiency ([place]) 12/17/2012   Hyperglycemia, drug-induced 12/17/2012   Hypokalemia 12/16/2012   Traumatic rupture of distal biceps tendon 02/20/2012   Depression 08/22/2011   Adenomatous polyps 06/29/2011   Abnormal CT scan, small bowel 03/05/2011   Fatty liver 03/05/2011   Pyelonephritis 02/19/2011   Status post splenectomy 02/19/2011   History of ITP 02/19/2011   OSA on CPAP 02/19/2011   GLOBUS HYSTERICUS 01/05/2009   Irritable bowel syndrome 01/05/2009   DYSPHAGIA UNSPECIFIED 01/05/2009   GERD 01/04/2009   HIATAL HERNIA 01/04/2009   HEMATOCHEZIA 01/04/2009   HEMOPTYSIS 01/04/2009   ABDOMINAL PAIN 01/04/2009    Past Surgical History:  Procedure Laterality Date   BIOPSY  02/26/2022   Procedure: BIOPSY;  Surgeon: Eloise Harman, DO;  [facility];  Service: Endoscopy;;   CHOLECYSTECTOMY  2006   COLONOSCOPY  2000   Dr. Gala Romney- tubular adenoma   COLONOSCOPY  08/10/2008   Dr. Gala Romney-  IMPRESSION:  Prominent external anal canal hemorrhoids, hyperplastic polyp   COLONOSCOPY N/A 10/08/2013   TOI:[id number] colonic polyps -  removed as described above/Prominent grade 3 hemorrhoids -  likely source of hematochezia. tubular adenomas. next TCS 10/2016   COLONOSCOPY WITH PROPOFOL N/A 02/25/2017   Procedure: COLONOSCOPY WITH PROPOFOL;  Surgeon: Daneil Dolin, MD;  Grade 4 internal hemorrhoids, 1 tubular adenoma, recommendations to repeat in 5 years.     COLONOSCOPY WITH PROPOFOL N/A 02/26/2022   Procedure: COLONOSCOPY WITH PROPOFOL;  Surgeon: Eloise Harman, DO;  [facility];  Service: Endoscopy;  Laterality: N/A;  9:45am, asa 3, ASAP   CYSTOSCOPY     with utetheral dilation   DISTAL BICEPS TENDON REPAIR  02/20/2012   Procedure: DISTAL BICEPS TENDON REPAIR;  Surgeon: Lorn Junes, MD;  Location: [place];  Service: Orthopedics;  Laterality: Left;  left elbow distal biceps tendon repair   ESOPHAGOGASTRODUODENOSCOPY  2010   Dr. Gala Romney: normal   ESOPHAGOGASTRODUODENOSCOPY (EGD) WITH PROPOFOL N/A 02/26/2022   Procedure: ESOPHAGOGASTRODUODENOSCOPY (EGD) WITH PROPOFOL;  Surgeon: Eloise Harman, DO;  [facility];  Service: Endoscopy;  Laterality: N/A;   FLEXIBLE SIGMOIDOSCOPY N/A 11/10/2013   Dr.Rourk- internal/external hemorrhoids. external hemorrhoids grade 3 with some residual prolapsing tissue. normal rectum aside form hemorrhoids. s/p band placement.   HEMORRHOID BANDING N/A 11/10/2013   Procedure: HEMORRHOID BANDING;  Surgeon: Daneil Dolin, MD;  [facility];  Service: Endoscopy;  Laterality: N/A;   HERNIA REPAIR     INCISIONAL HERNIA REPAIR N/A 04/10/2017   Procedure: INCISIONAL HERNIORRHAPHY WITH MESH;  Surgeon: Aviva Signs, MD;  Location: [facility];  Service: General;  Laterality: N/A;   KNEE SURGERY     left   POLYPECTOMY  02/25/2017   Procedure: POLYPECTOMY - ASCENDING COLON;  Surgeon: Daneil Dolin, MD;  [facility];  Service: Endoscopy;;   POLYPECTOMY  02/26/2022   Procedure: POLYPECTOMY;  Surgeon: Eloise Harman, DO;  [facility];  Service: Endoscopy;;  gastric ; colon   SPLENECTOMY  2003   itp       Home Medications    Prior to Admission medications   Medication Sig Start Date End Date Taking? Authorizing Provider  albuterol (VENTOLIN HFA) 108 (90 Base) MCG/ACT inhaler Inhale 2 puffs into the lungs every 4 (four) hours as needed for wheezing or  shortness of breath. 05/20/22  Yes Volney American, PA-C  azithromycin (ZITHROMAX) 250 MG tablet Take first 2 tablets together, then 1 every day until finished. 05/20/22  Yes Volney American, PA-C  budesonide-formoterol [facility]) 160-4.5 MCG/ACT inhaler Inhale 2 puffs into the lungs 2 (two) times daily. 05/20/22  Yes Volney American, PA-C  guaiFENesin (MUCINEX) 600 MG 12 hr tablet Take 1 tablet (600 mg total) by mouth 2 (two) times daily. 05/20/22  Yes Volney American, PA-C  promethazine-dextromethorphan (PROMETHAZINE-DM) 6.25-15 MG/5ML syrup Take 5 mLs by mouth 4 (four) times daily as needed. 05/20/22  Yes Volney American, PA-C  amLODipine (NORVASC) 10 MG tablet Take 1 tablet (10 mg total) by mouth daily. 01/05/22   Murlean Iba, MD  blood glucose meter kit and supplies Dispense based on patient and insurance preference. Use up to four times daily as directed. (FOR ICD-10 E10.9, E11.9). 01/05/22   Johnson, Clanford L, MD  cholecalciferol (VITAMIN D3) 25 MCG (1000 UNIT) tablet Take 1,000 Units by mouth daily.  [provider]  DULoxetine (CYMBALTA) 60 MG capsule Take 120 mg by mouth at bedtime.     [provider]  glimepiride (AMARYL) 4 MG tablet Take 4 mg by mouth 2 (two) times daily. 12/22/21   [provider]  hydrocortisone (CORTEF) 20 MG tablet Take 1 tablet (20 mg total) by mouth daily. For cortisol replacement 01/05/22   Murlean Iba, MD  levothyroxine (SYNTHROID) 137 MCG tablet Take 137 mcg by mouth daily before breakfast.    [provider]  levothyroxine (SYNTHROID) 150 MCG tablet Take 150 mcg by mouth every morning. Patient not taking: Reported on 03/22/2022 09/30/19   [provider]  losartan (COZAAR) 100 MG tablet Take 100 mg by mouth daily. 04/01/17   [provider]  metFORMIN (GLUCOPHAGE) 1000 MG tablet Take 1,000 mg by mouth 2 (two) times daily. 10/21/17   [provider]   Multiple Vitamins-Minerals (MENS MULTIVITAMIN PLUS) TABS Take 1 tablet by mouth daily. /For nutritional supplementation. 08/23/11   Darrol Jump, MD  Omega-3 Fatty Acids (OMEGA-3 FISH OIL PO) Take 1 capsule by mouth at bedtime.     [provider]  omeprazole (PRILOSEC) 40 MG capsule Take 1 capsule (40 mg total) by mouth 2 (two) times daily. 01/05/22   Johnson, Clanford L, MD  ondansetron (ZOFRAN-ODT) 4 MG disintegrating tablet TAKE 1 TABLET BY MOUTH EVERY 8 HOURS AS NEEDED FOR NAUSEA OR VOMITING. 04/23/22   Rourk, Cristopher Estimable, MD  rosuvastatin (CRESTOR) 20 MG tablet Take 1 tablet (20 mg total) by mouth daily. 01/17/22   Swinyer, Lanice Schwab, NP  sitaGLIPtin (JANUVIA) 100 MG tablet Take 100 mg by mouth at bedtime.     [provider]  tamsulosin (FLOMAX) 0.4 MG CAPS capsule Take 0.4 mg by mouth in the morning.     [provider]    Family History Family History  Problem Relation Age of Onset   Colon cancer Father 68       deceased 44 months after diagnosis   Diabetes Brother     Social History Social History   Tobacco Use   Smoking status: Former    Packs/day: 0.50    Years: 30.00    Total pack years: 15.00    Types: Cigarettes    Quit date: 03/05/2007    Years since quitting: 15.2   Smokeless tobacco: Never  Vaping Use   Vaping Use: Never used  Substance Use Topics   Alcohol use: No    Comment: 2008   Drug use: No    Comment: 2008     Allergies   Dilaudid [hydromorphone] and Pregabalin   Review of Systems Review of Systems PER HPI  Physical Exam Triage Vital Signs ED Triage Vitals  Enc Vitals Group     BP 05/20/22 1414 (!) 158/91     Pulse Rate 05/20/22 1414 95     Resp 05/20/22 1414 18     Temp 05/20/22 1414 98.1 F (36.7 C)     Temp Source 05/20/22 1414 Oral     SpO2 05/20/22 1414 94 %     Weight --      Height --      Head Circumference --      Peak Flow --      Pain Score 05/20/22 1422 7     Pain Loc --      Pain Edu? --       Excl. in [place]? --    No data found.  Updated Vital Signs BP (!) 158/91 (BP Location: Right Arm)   Pulse 95   Temp 98.1 F (36.7 C) (Oral)   Resp 18   SpO2 94%   Visual Acuity Right Eye Distance:   Left Eye Distance:   Bilateral Distance:    Right Eye Near:   Left Eye Near:    Bilateral Near:     Physical Exam Vitals and nursing note reviewed.  Constitutional:      Appearance: He is well-developed.  HENT:     Head: Atraumatic.     Right Ear: External ear normal.     Left Ear: External ear normal.     Nose: Rhinorrhea present.     Mouth/Throat:     Pharynx: Posterior oropharyngeal erythema present. No oropharyngeal exudate.  Eyes:     Conjunctiva/sclera: Conjunctivae normal.     Pupils: Pupils are equal, round, and reactive to light.  Cardiovascular:     Rate and Rhythm: Normal rate and regular rhythm.  Pulmonary:     Effort: Pulmonary effort is normal. No respiratory distress.     Breath sounds: Wheezing present. No rales.  Musculoskeletal:        General: Normal range of motion.     Cervical back: Normal range of motion and neck supple.  Lymphadenopathy:     Cervical: No cervical adenopathy.  Skin:    General: Skin is warm and dry.  Neurological:     Mental Status: He is alert and oriented to person, place, and time.  Psychiatric:        Behavior: Behavior normal.      UC Treatments / Results  Labs (all labs ordered are listed, but only abnormal results are displayed) Labs Reviewed  RESP PANEL BY RT-PCR (FLU A&B, COVID) ARPGX2 - Abnormal; Notable for the following components:      Result Value   Influenza A by PCR POSITIVE (*)    All other components within normal limits    EKG   Radiology No results found.  Procedures Procedures (including critical care time)  Medications Ordered in UC Medications  ipratropium-albuterol (DUONEB) 0.5-2.5 (3) MG/3ML nebulizer solution 3 mL (3 mLs Nebulization Given 05/20/22 1522)    Initial Impression /  Assessment and Plan / UC Course  I have reviewed the triage vital signs and the nursing notes.  Pertinent labs & imaging results that were available during my care of the patient were reviewed by me and considered in my medical decision making (see chart for details).     Vitals overall reassuring in clinic, significant improvement in lung sounds after DuoNeb treatment in clinic.  Suspect influenza-like illness causing asthma exacerbation.  Treat with Symbicort, albuterol, Phenergan DM, Mucinex and start Zithromax if not improving over the next few days as he is very prone to pneumonia.  Respiratory panel pending, good candidate for antiviral therapy if positive.  Final Clinical Impressions(s) / UC Diagnoses   Final diagnoses:  Lower respiratory infection  Hemoptysis  Mild intermittent asthma with acute exacerbation   Discharge Instructions   None    ED Prescriptions     Medication Sig Dispense Auth. Provider   budesonide-formoterol (SYMBICORT) 160-4.5 MCG/ACT inhaler Inhale 2 puffs into the lungs 2 (two) times daily. 1 each Volney American, PA-C   albuterol (VENTOLIN HFA) 108 (90 Base) MCG/ACT inhaler Inhale 2 puffs into the lungs every 4 (four) hours as needed for wheezing or shortness of breath. 18 g Volney American, PA-C   promethazine-dextromethorphan (PROMETHAZINE-DM) 6.25-15  MG/5ML syrup Take 5 mLs by mouth 4 (four) times daily as needed. 100 mL Volney American, PA-C   guaiFENesin (MUCINEX) 600 MG 12 hr tablet Take 1 tablet (600 mg total) by mouth 2 (two) times daily. 20 tablet Volney American, [place]   azithromycin (ZITHROMAX) 250 MG tablet Take first 2 tablets together, then 1 every day until finished. 6 tablet Volney American, [place]      PDMP not reviewed this encounter.   Volney American, [place] 05/24/22 1409

## 2022-06-16 ENCOUNTER — Other Ambulatory Visit: Payer: Self-pay | Admitting: Family Medicine

## 2022-06-24 ENCOUNTER — Ambulatory Visit (INDEPENDENT_AMBULATORY_CARE_PROVIDER_SITE_OTHER): Payer: BC Managed Care – PPO

## 2022-06-24 ENCOUNTER — Ambulatory Visit
Admission: EM | Admit: 2022-06-24 | Discharge: 2022-06-24 | Disposition: A | Discharge status: Home / Self Care | Payer: BC Managed Care – PPO | Attending: Urgent Care | Admitting: Urgent Care

## 2022-06-24 DIAGNOSIS — S2242XA Multiple fractures of ribs, left side, initial encounter for closed fracture: Secondary | ICD-10-CM | POA: Diagnosis not present

## 2022-06-24 DIAGNOSIS — R0782 Intercostal pain: Secondary | ICD-10-CM | POA: Diagnosis not present

## 2022-06-24 DIAGNOSIS — R0781 Pleurodynia: Secondary | ICD-10-CM

## 2022-06-24 DIAGNOSIS — W19XXXA Unspecified fall, initial encounter: Secondary | ICD-10-CM | POA: Diagnosis not present

## 2022-06-24 DIAGNOSIS — M79602 Pain in left arm: Secondary | ICD-10-CM

## 2022-06-24 DIAGNOSIS — J9811 Atelectasis: Secondary | ICD-10-CM | POA: Diagnosis not present

## 2022-06-24 DIAGNOSIS — M25522 Pain in left elbow: Secondary | ICD-10-CM

## 2022-06-24 MED ORDER — TIZANIDINE HCL 4 MG PO TABS: 4.0000 mg | ORAL_TABLET | Freq: Every day | ORAL | 0 refills | Status: DC

## 2022-06-24 MED ORDER — ACETAMINOPHEN 325 MG PO TABS: 650.0000 mg | ORAL_TABLET | Freq: Four times a day (QID) | ORAL | 0 refills | Status: DC | PRN

## 2022-06-24 MED ORDER — HYDROCODONE-ACETAMINOPHEN 5-325 MG PO TABS: 1.0000 | ORAL_TABLET | Freq: Four times a day (QID) | ORAL | 0 refills | Status: DC | PRN

## 2022-06-24 NOTE — ED Provider Notes (Signed)
[facility]  Note:  This document was prepared using Dragon voice recognition software and may include unintentional dictation errors.  MRN: [id number] DOB: [birth year]  Subjective:   [name] [name] is a 57 y.o. male presenting for 2 day history of acute onset persistent left sided chest pain, chest wall pain. Fell from some steps outside, landed on the left side of his body/chest pain, hit his left shoulder and left arm as well. No loss of consciousness, confusion, headache, double vision, weakness, numbness and tingling. Has used Aleve twice. No history of heart attack.   No current facility-administered medications for this encounter.  Current Outpatient Medications:    albuterol (VENTOLIN HFA) 108 (90 Base) MCG/ACT inhaler, Inhale 2 puffs into the lungs every 4 (four) hours as needed for wheezing or shortness of breath., Disp: 18 g, Rfl: 0   amLODipine (NORVASC) 10 MG tablet, Take 1 tablet (10 mg total) by mouth daily., Disp: 30 tablet, Rfl: 1   azithromycin (ZITHROMAX) 250 MG tablet, Take first 2 tablets together, then 1 every day until finished., Disp: 6 tablet, Rfl: 0   blood glucose meter kit and supplies, Dispense based on patient and insurance preference. Use up to four times daily as directed. (FOR ICD-10 E10.9, E11.9)., Disp: 1 each, Rfl: 0   budesonide-formoterol (SYMBICORT) 160-4.5 MCG/ACT inhaler, Inhale 2 puffs into the lungs 2 (two) times daily., Disp: 1 each, Rfl: 0   cholecalciferol (VITAMIN D3) 25 MCG (1000 UNIT) tablet, Take 1,000 Units by mouth daily., Disp: , Rfl:    DULoxetine (CYMBALTA) 60 MG capsule, Take 120 mg by mouth at bedtime. , Disp: , Rfl:    glimepiride (AMARYL) 4 MG tablet, Take 4 mg by mouth 2 (two) times daily., Disp: , Rfl:    guaiFENesin (MUCINEX) 600 MG 12 hr tablet, Take 1 tablet (600 mg total) by mouth 2 (two) times daily., Disp: 20 tablet, Rfl: 0   hydrocortisone (CORTEF) 20 MG tablet, Take 1 tablet (20 mg total) by mouth daily. For  cortisol replacement, Disp: , Rfl:    levothyroxine (SYNTHROID) 137 MCG tablet, Take 137 mcg by mouth daily before breakfast., Disp: , Rfl:    levothyroxine (SYNTHROID) 150 MCG tablet, Take 150 mcg by mouth every morning. (Patient not taking: Reported on 03/22/2022), Disp: , Rfl:    losartan (COZAAR) 100 MG tablet, Take 100 mg by mouth daily., Disp: , Rfl: 0   metFORMIN (GLUCOPHAGE) 1000 MG tablet, Take 1,000 mg by mouth 2 (two) times daily., Disp: , Rfl: 5   Multiple Vitamins-Minerals (MENS MULTIVITAMIN PLUS) TABS, Take 1 tablet by mouth daily. /For nutritional supplementation., Disp: 30 tablet, Rfl: 0   Omega-3 Fatty Acids (OMEGA-3 FISH OIL PO), Take 1 capsule by mouth at bedtime. , Disp: , Rfl:    omeprazole (PRILOSEC) 40 MG capsule, Take 1 capsule (40 mg total) by mouth 2 (two) times daily., Disp: 60 capsule, Rfl: 1   ondansetron (ZOFRAN-ODT) 4 MG disintegrating tablet, TAKE 1 TABLET BY MOUTH EVERY 8 HOURS AS NEEDED FOR NAUSEA OR VOMITING., Disp: 12 tablet, Rfl: 3   promethazine-dextromethorphan (PROMETHAZINE-DM) 6.25-15 MG/5ML syrup, Take 5 mLs by mouth 4 (four) times daily as needed., Disp: 100 mL, Rfl: 0   rosuvastatin (CRESTOR) 20 MG tablet, Take 1 tablet (20 mg total) by mouth daily., Disp: 90 tablet, Rfl: 3   sitaGLIPtin (JANUVIA) 100 MG tablet, Take 100 mg by mouth at bedtime. , Disp: , Rfl:    tamsulosin (FLOMAX) 0.4 MG CAPS capsule, Take 0.4 mg  by mouth in the morning. , Disp: , Rfl:    Allergies  Allergen Reactions   Dilaudid [Hydromorphone] Other (See Comments)    "It makes me go crazy." Per patient    Pregabalin     Other reaction(s): anaphylaxis    Past Medical History:  Diagnosis Date   Arthritis    Asthma    as child   Back pain, chronic 02/18/11   BULGING DISK   Complication of anesthesia    "woke up during spleenectomy at [facility]".   Diabetes mellitus without complication (HCC)    GERD (gastroesophageal reflux disease)    erosive reflux esophagitis, small HH    Hemorrhoids    History of kidney stones    Hyperlipemia    Hypertension    Hypothyroidism    IBS (irritable bowel syndrome)    Low serum cortisol level    Dr Suzette Battiest   Lower extremity edema    Nephrolithiasis 12/19/2012   Plantar fasciitis    Restless leg syndrome    S/P colonoscopy 08/2008   int/ext hemorrhoids. friable, rectal polyp   S/P endoscopy 01/10/09   Dr Vivi Ferns, small [facility]   Sleep apnea    cpap   Spinal stenosis of lumbar region 12/18/2012   Thyroid disease    Tubular adenoma of colon 2000   UTI (lower urinary tract infection)      Past Surgical History:  Procedure Laterality Date   BIOPSY  02/26/2022   Procedure: BIOPSY;  Surgeon: Eloise Harman, DO;  [facility];  Service: Endoscopy;;   CHOLECYSTECTOMY  2006   COLONOSCOPY  2000   Dr. Gala Romney- tubular adenoma   COLONOSCOPY  08/10/2008   Dr. Gala Romney-  IMPRESSION:  Prominent external anal canal hemorrhoids, hyperplastic polyp   COLONOSCOPY N/A 10/08/2013   AJO:INOMVEHM colonic polyps -  removed as described above/Prominent grade 3 hemorrhoids -  likely source of hematochezia. tubular adenomas. next TCS 10/2016   COLONOSCOPY WITH PROPOFOL N/A 02/25/2017   Procedure: COLONOSCOPY WITH PROPOFOL;  Surgeon: Daneil Dolin, MD;  Grade 4 internal hemorrhoids, 1 tubular adenoma, recommendations to repeat in 5 years.    COLONOSCOPY WITH PROPOFOL N/A 02/26/2022   Procedure: COLONOSCOPY WITH PROPOFOL;  Surgeon: Eloise Harman, DO;  [facility];  Service: Endoscopy;  Laterality: N/A;  9:45am, asa 3, ASAP   CYSTOSCOPY     with utetheral dilation   DISTAL BICEPS TENDON REPAIR  02/20/2012   Procedure: DISTAL BICEPS TENDON REPAIR;  Surgeon: Lorn Junes, MD;  Location: [place];  Service: Orthopedics;  Laterality: Left;  left elbow distal biceps tendon repair   ESOPHAGOGASTRODUODENOSCOPY  2010   Dr. Gala Romney: normal   ESOPHAGOGASTRODUODENOSCOPY (EGD) WITH PROPOFOL N/A 02/26/2022   Procedure:  ESOPHAGOGASTRODUODENOSCOPY (EGD) WITH PROPOFOL;  Surgeon: Eloise Harman, DO;  [facility];  Service: Endoscopy;  Laterality: N/A;   FLEXIBLE SIGMOIDOSCOPY N/A 11/10/2013   Dr.Rourk- internal/external hemorrhoids. external hemorrhoids grade 3 with some residual prolapsing tissue. normal rectum aside form hemorrhoids. s/p band placement.   HEMORRHOID BANDING N/A 11/10/2013   Procedure: HEMORRHOID BANDING;  Surgeon: Daneil Dolin, MD;  [facility];  Service: Endoscopy;  Laterality: N/A;   HERNIA REPAIR     INCISIONAL HERNIA REPAIR N/A 04/10/2017   Procedure: Fatima Blank HERNIORRHAPHY WITH MESH;  Surgeon: Aviva Signs, MD;  [facility]: [facility];  Service: General;  Laterality: N/A;   KNEE SURGERY     left   POLYPECTOMY  02/25/2017  Procedure: POLYPECTOMY - ASCENDING COLON;  Surgeon: Daneil Dolin, MD;  [facility];  Service: Endoscopy;;   POLYPECTOMY  02/26/2022   Procedure: POLYPECTOMY;  Surgeon: Eloise Harman, DO;  [facility];  Service: Endoscopy;;  gastric ; colon   SPLENECTOMY  2003   itp    Family History  Problem Relation Age of Onset   Colon cancer Father 66       deceased 6 months after diagnosis   Diabetes Brother     Social History   Tobacco Use   Smoking status: Former    Packs/day: 0.50    Years: 30.00    Total pack years: 15.00    Types: Cigarettes    Quit date: 03/05/2007    Years since quitting: 15.3   Smokeless tobacco: Never  Vaping Use   Vaping Use: Never used  Substance Use Topics   Alcohol use: No    Comment: 2008   Drug use: No    Comment: 2008    ROS   Objective:   Vitals: BP (!) 163/98 (BP Location: Right Arm)   Pulse 87   Temp 98.1 F (36.7 C) (Oral)   Resp (!) 26   SpO2 93%   Physical Exam Constitutional:      General: He is not in acute distress.    Appearance: Normal appearance. He is well-developed and normal weight. He is not ill-appearing, toxic-appearing or diaphoretic.  HENT:      Head: Normocephalic and atraumatic.     Right Ear: External ear normal.     Left Ear: External ear normal.     Nose: Nose normal.     Mouth/Throat:     Mouth: Mucous membranes are moist.  Eyes:     General: No scleral icterus.       Right eye: No discharge.        Left eye: No discharge.     Extraocular Movements: Extraocular movements intact.  Cardiovascular:     Rate and Rhythm: Normal rate and regular rhythm.     Heart sounds: Normal heart sounds. No murmur heard.    No friction rub. No gallop.  Pulmonary:     Effort: Pulmonary effort is normal. No respiratory distress.     Breath sounds: Normal breath sounds. No stridor. No wheezing, rhonchi or rales.  Musculoskeletal:        General: No swelling or deformity. Normal range of motion.     Cervical back: Normal range of motion.     Right lower leg: No edema.     Left lower leg: No edema.  Neurological:     Mental Status: He is alert and oriented to person, place, and time.  Psychiatric:        Mood and Affect: Mood normal.        Behavior: Behavior normal.        Thought Content: Thought content normal.        Judgment: Judgment normal.     DG Ribs Unilateral W/Chest Left  Result Date: 06/24/2022 CLINICAL DATA:  [place] 3 days ago.  Left rib pain. EXAM: LEFT RIBS AND CHEST - 3+ VIEW COMPARISON:  Chest x-ray 05/20/2022 FINDINGS: Low volume film. Subsegmental atelectasis noted left base. Lungs otherwise clear. No pneumothorax or pleural effusion. The cardiopericardial silhouette is within normal limits for size. Oblique views of the left ribs show possible but not definite nondisplaced acute fractures of the anterior left third and fifth ribs. IMPRESSION: 1. Possible  but not definite nondisplaced acute fractures of the anterior left third and fifth ribs. 2. No pneumothorax or pleural effusion. Electronically Signed   By: Misty Stanley M.D.   On: 06/24/2022 15:08     Assessment and Plan :   PDMP not reviewed this encounter.  1.  Closed fracture of multiple ribs of left side, initial encounter   2. Rib pain on left side   3. Accidental fall, initial encounter   4. Left arm pain   5. Left elbow pain    Will manage for multiple rib fractures of the left side, specifically the anterior left third and fifth ribs.  Recommended scheduling Tylenol, use hydrocodone for breakthrough pain.  Use tizanidine as a muscle relaxant for the ribs.  Advised that he not use an Ace wrap. Counseled patient on potential for adverse effects with medications prescribed/recommended today, ER and return-to-clinic precautions discussed, patient verbalized understanding.    Jaynee Eagles, [place] 06/24/22 1546

## 2022-06-24 NOTE — Discharge Instructions (Addendum)
Please schedule Tylenol at 500 mg - 650 mg once every 6 hours as needed for aches and pains.  If you still have pain despite taking Tylenol regularly, this is breakthrough pain.  You can use hydrocodone once every 6 hours for this.  Once your pain is better controlled, switch back to just Tylenol. It is okay to use the muscle relaxant at night.

## 2022-06-24 NOTE — ED Triage Notes (Signed)
Pt reports left sided chest pain and rib cage pain after he fell out of a truck over rocks and laded on the left sided of his body 2 days ago. Reports pain is worse when sneezing, moving, coughing. Aleve and heating pads gives some relief.

## 2022-06-27 ENCOUNTER — Ambulatory Visit: Payer: BC Managed Care – PPO | Admitting: Gastroenterology

## 2022-06-29 DIAGNOSIS — I10 Essential (primary) hypertension: Secondary | ICD-10-CM | POA: Diagnosis not present

## 2022-06-29 DIAGNOSIS — E039 Hypothyroidism, unspecified: Secondary | ICD-10-CM | POA: Diagnosis not present

## 2022-06-29 DIAGNOSIS — E1165 Type 2 diabetes mellitus with hyperglycemia: Secondary | ICD-10-CM | POA: Diagnosis not present

## 2022-06-29 DIAGNOSIS — R109 Unspecified abdominal pain: Secondary | ICD-10-CM | POA: Diagnosis not present

## 2022-06-29 DIAGNOSIS — K589 Irritable bowel syndrome without diarrhea: Secondary | ICD-10-CM | POA: Diagnosis not present

## 2022-06-29 DIAGNOSIS — J189 Pneumonia, unspecified organism: Secondary | ICD-10-CM | POA: Diagnosis not present

## 2022-06-29 DIAGNOSIS — Z029 Encounter for administrative examinations, unspecified: Secondary | ICD-10-CM | POA: Diagnosis not present

## 2022-06-29 DIAGNOSIS — E782 Mixed hyperlipidemia: Secondary | ICD-10-CM | POA: Diagnosis not present

## 2022-06-29 DIAGNOSIS — Z6841 Body Mass Index (BMI) 40.0 and over, adult: Secondary | ICD-10-CM | POA: Diagnosis not present

## 2022-06-29 DIAGNOSIS — Z0000 Encounter for general adult medical examination without abnormal findings: Secondary | ICD-10-CM | POA: Diagnosis not present

## 2022-07-05 ENCOUNTER — Ambulatory Visit
Admission: EM | Admit: 2022-07-05 | Discharge: 2022-07-05 | Disposition: A | Discharge status: Home / Self Care | Payer: BC Managed Care – PPO | Attending: Family Medicine | Admitting: Family Medicine

## 2022-07-05 ENCOUNTER — Ambulatory Visit (INDEPENDENT_AMBULATORY_CARE_PROVIDER_SITE_OTHER): Payer: BC Managed Care – PPO

## 2022-07-05 DIAGNOSIS — M25512 Pain in left shoulder: Secondary | ICD-10-CM | POA: Diagnosis not present

## 2022-07-05 DIAGNOSIS — M549 Dorsalgia, unspecified: Secondary | ICD-10-CM | POA: Diagnosis not present

## 2022-07-05 DIAGNOSIS — S2232XA Fracture of one rib, left side, initial encounter for closed fracture: Secondary | ICD-10-CM | POA: Diagnosis not present

## 2022-07-05 MED ORDER — HYDROCODONE-ACETAMINOPHEN 5-325 MG PO TABS: 1.0000 | ORAL_TABLET | Freq: Two times a day (BID) | ORAL | 0 refills | Status: DC | PRN

## 2022-07-05 MED ORDER — TIZANIDINE HCL 4 MG PO TABS: 4.0000 mg | ORAL_TABLET | Freq: Three times a day (TID) | ORAL | 0 refills | Status: DC | PRN

## 2022-07-05 NOTE — ED Triage Notes (Signed)
Pt reports sharp left shoulder blade pain x 12 days ago   Hurts worst with sneezing and coughing. Feels like something is broken or cracked. Pt says he has episodes where it gets so bad he feels like he could faint.

## 2022-07-05 NOTE — Discharge Instructions (Signed)
Your x-ray today does not show any new fractures apart from the ones identified at your last visit.  I have refilled your muscle relaxer and pain medication, use a heating pad, muscle rubs, massages to help additionally.

## 2022-07-05 NOTE — ED Provider Notes (Signed)
[facility]    CSN: [id number] Arrival date & time: 07/05/22  1655      History   Chief Complaint No chief complaint on file.   HPI [name] [name] is a 57 y.o. male.   Patient presenting today with ongoing severe left upper back pain in the scapular region following a fall 2 weeks ago.  He came for evaluation on 06/24/2022 and was found to have several anterior left rib fractures but states imaging was not performed on his scapular region where the sharp stabbing pains persist.  Denies chest pain, shortness of breath, wheezing, dizziness.  Was given muscle relaxers and hydrocodone at recent visit, states these helped for small periods of time but he is run out.    Past Medical History:  Diagnosis Date   Arthritis    Asthma    as child   Back pain, chronic 02/18/11   BULGING DISK   Complication of anesthesia    "woke up during spleenectomy at [facility]".   Diabetes mellitus without complication (HCC)    GERD (gastroesophageal reflux disease)    erosive reflux esophagitis, small HH   Hemorrhoids    History of kidney stones    Hyperlipemia    Hypertension    Hypothyroidism    IBS (irritable bowel syndrome)    Low serum cortisol level    Dr Suzette Battiest   Lower extremity edema    Nephrolithiasis 12/19/2012   Plantar fasciitis    Restless leg syndrome    S/P colonoscopy 08/2008   int/ext hemorrhoids. friable, rectal polyp   S/P endoscopy 01/10/09   Dr Vivi Ferns, small [facility]   Sleep apnea    cpap   Spinal stenosis of lumbar region 12/18/2012   Thyroid disease    Tubular adenoma of colon 2000   UTI (lower urinary tract infection)     Patient Active Problem List   Diagnosis Date Noted   Non-toxic goiter 01/16/2022   Postoperative hypothyroidism 01/16/2022   Pure hypercholesterolemia 01/16/2022   Testicular hypofunction 01/16/2022   Type 2 diabetes mellitus without complications ([place]) 19/62/2297   Adrenal cortical hypofunction ([place]) 01/16/2022   Chest pain 01/03/2022    Obesity, Class III, BMI 40-49.9 (morbid obesity) ([place]) 01/03/2022   Essential hypertension 01/03/2022   Uncontrolled type 2 diabetes mellitus with hyperglycemia ([place]) 10/26/2019   Hyperglycemia due to diabetes mellitus ([place]) 10/25/2019   Generalized weakness 10/25/2019   Urinary tract infection 10/24/2019   Rectal bleeding 02/07/2017   Abdominal hernia 02/07/2017   FHx: colon cancer 02/07/2017   Hemorrhoids, complicated 98/92/1194   Nephrolithiasis 12/19/2012   Spinal stenosis of lumbar region 12/18/2012   RBBB 12/17/2012   Hypothyroidism 12/17/2012   Adrenal insufficiency ([place]) 12/17/2012   Hyperglycemia, drug-induced 12/17/2012   Hypokalemia 12/16/2012   Traumatic rupture of distal biceps tendon 02/20/2012   Depression 08/22/2011   Adenomatous polyps 06/29/2011   Abnormal CT scan, small bowel 03/05/2011   Fatty liver 03/05/2011   Pyelonephritis 02/19/2011   Status post splenectomy 02/19/2011   History of ITP 02/19/2011   OSA on CPAP 02/19/2011   GLOBUS HYSTERICUS 01/05/2009   Irritable bowel syndrome 01/05/2009   DYSPHAGIA UNSPECIFIED 01/05/2009   GERD 01/04/2009   HIATAL HERNIA 01/04/2009   HEMATOCHEZIA 01/04/2009   HEMOPTYSIS 01/04/2009   ABDOMINAL PAIN 01/04/2009    Past Surgical History:  Procedure Laterality Date   BIOPSY  02/26/2022   Procedure: BIOPSY;  Surgeon: Eloise Harman, DO;  [facility];  Service: Endoscopy;;  CHOLECYSTECTOMY  2006   COLONOSCOPY  2000   Dr. Gala Romney- tubular adenoma   COLONOSCOPY  08/10/2008   Dr. Gala Romney-  IMPRESSION:  Prominent external anal canal hemorrhoids, hyperplastic polyp   COLONOSCOPY N/A 10/08/2013   OBS:JGGEZMOQ colonic polyps -  removed as described above/Prominent grade 3 hemorrhoids -  likely source of hematochezia. tubular adenomas. next TCS 10/2016   COLONOSCOPY WITH PROPOFOL N/A 02/25/2017   Procedure: COLONOSCOPY WITH PROPOFOL;  Surgeon: Daneil Dolin, MD;  Grade 4 internal hemorrhoids, 1 tubular adenoma,  recommendations to repeat in 5 years.    COLONOSCOPY WITH PROPOFOL N/A 02/26/2022   Procedure: COLONOSCOPY WITH PROPOFOL;  Surgeon: Eloise Harman, DO;  [facility];  Service: Endoscopy;  Laterality: N/A;  9:45am, asa 3, ASAP   CYSTOSCOPY     with utetheral dilation   DISTAL BICEPS TENDON REPAIR  02/20/2012   Procedure: DISTAL BICEPS TENDON REPAIR;  Surgeon: Lorn Junes, MD;  Location: [facility];  Service: Orthopedics;  Laterality: Left;  left elbow distal biceps tendon repair   ESOPHAGOGASTRODUODENOSCOPY  2010   Dr. Gala Romney: normal   ESOPHAGOGASTRODUODENOSCOPY (EGD) WITH PROPOFOL N/A 02/26/2022   Procedure: ESOPHAGOGASTRODUODENOSCOPY (EGD) WITH PROPOFOL;  Surgeon: Eloise Harman, DO;  [facility];  Service: Endoscopy;  Laterality: N/A;   FLEXIBLE SIGMOIDOSCOPY N/A 11/10/2013   Dr.Rourk- internal/external hemorrhoids. external hemorrhoids grade 3 with some residual prolapsing tissue. normal rectum aside form hemorrhoids. s/p band placement.   HEMORRHOID BANDING N/A 11/10/2013   Procedure: HEMORRHOID BANDING;  Surgeon: Daneil Dolin, MD;  [facility];  Service: Endoscopy;  Laterality: N/A;   HERNIA REPAIR     INCISIONAL HERNIA REPAIR N/A 04/10/2017   Procedure: INCISIONAL HERNIORRHAPHY WITH MESH;  Surgeon: Aviva Signs, MD;  Location: [facility];  Service: General;  Laterality: N/A;   KNEE SURGERY     left   POLYPECTOMY  02/25/2017   Procedure: POLYPECTOMY - ASCENDING COLON;  Surgeon: Daneil Dolin, MD;  [facility];  Service: Endoscopy;;   POLYPECTOMY  02/26/2022   Procedure: POLYPECTOMY;  Surgeon: Eloise Harman, DO;  [facility];  Service: Endoscopy;;  gastric ; colon   SPLENECTOMY  2003   itp       Home Medications    Prior to Admission medications   Medication Sig Start Date End Date Taking? Authorizing Provider  acetaminophen (TYLENOL) 325 MG tablet Take 2 tablets (650 mg total) by mouth every 6 (six)  hours as needed for moderate pain. 06/24/22   Jaynee Eagles, PA-C  albuterol (VENTOLIN HFA) 108 (90 Base) MCG/ACT inhaler Inhale 2 puffs into the lungs every 4 (four) hours as needed for wheezing or shortness of breath. 05/20/22   Volney American, PA-C  amLODipine (NORVASC) 10 MG tablet Take 1 tablet (10 mg total) by mouth daily. 01/05/22   Johnson, Clanford L, MD  azithromycin (ZITHROMAX) 250 MG tablet Take first 2 tablets together, then 1 every day until finished. 05/20/22   Volney American, PA-C  blood glucose meter kit and supplies Dispense based on patient and insurance preference. Use up to four times daily as directed. (FOR ICD-10 E10.9, E11.9). 01/05/22   Johnson, Clanford L, MD  budesonide-formoterol (SYMBICORT) 160-4.5 MCG/ACT inhaler Inhale 2 puffs into the lungs 2 (two) times daily. 05/20/22   Volney American, PA-C  cholecalciferol (VITAMIN D3) 25 MCG (1000 UNIT) tablet Take 1,000 Units by mouth daily.    [provider]  DULoxetine (CYMBALTA) 60 MG capsule Take 120 mg by mouth at bedtime.     [provider]  glimepiride (AMARYL) 4 MG tablet Take 4 mg by mouth 2 (two) times daily. 12/22/21   [provider]  guaiFENesin (MUCINEX) 600 MG 12 hr tablet Take 1 tablet (600 mg total) by mouth 2 (two) times daily. 05/20/22   Volney American, PA-C  HYDROcodone-acetaminophen (NORCO/VICODIN) 5-325 MG tablet Take 1 tablet by mouth 2 (two) times daily as needed for severe pain. 07/05/22   Volney American, PA-C  hydrocortisone (CORTEF) 20 MG tablet Take 1 tablet (20 mg total) by mouth daily. For cortisol replacement 01/05/22   Murlean Iba, MD  levothyroxine (SYNTHROID) 137 MCG tablet Take 137 mcg by mouth daily before breakfast.    [provider]  levothyroxine (SYNTHROID) 150 MCG tablet Take 150 mcg by mouth every morning. Patient not taking: Reported on 03/22/2022 09/30/19   [provider]  losartan (COZAAR) 100 MG tablet  Take 100 mg by mouth daily. 04/01/17   [provider]  metFORMIN (GLUCOPHAGE) 1000 MG tablet Take 1,000 mg by mouth 2 (two) times daily. 10/21/17   [provider]  Multiple Vitamins-Minerals (MENS MULTIVITAMIN PLUS) TABS Take 1 tablet by mouth daily. /For nutritional supplementation. 08/23/11   Darrol Jump, MD  Omega-3 Fatty Acids (OMEGA-3 FISH OIL PO) Take 1 capsule by mouth at bedtime.     [provider]  omeprazole (PRILOSEC) 40 MG capsule Take 1 capsule (40 mg total) by mouth 2 (two) times daily. 01/05/22   Johnson, Clanford L, MD  ondansetron (ZOFRAN-ODT) 4 MG disintegrating tablet TAKE 1 TABLET BY MOUTH EVERY 8 HOURS AS NEEDED FOR NAUSEA OR VOMITING. 04/23/22   Rourk, Cristopher Estimable, MD  promethazine-dextromethorphan (PROMETHAZINE-DM) 6.25-15 MG/5ML syrup Take 5 mLs by mouth 4 (four) times daily as needed. 05/20/22   Volney American, PA-C  rosuvastatin (CRESTOR) 20 MG tablet Take 1 tablet (20 mg total) by mouth daily. 01/17/22   Swinyer, Lanice Schwab, NP  sitaGLIPtin (JANUVIA) 100 MG tablet Take 100 mg by mouth at bedtime.     [provider]  tamsulosin (FLOMAX) 0.4 MG CAPS capsule Take 0.4 mg by mouth in the morning.     [provider]  tiZANidine (ZANAFLEX) 4 MG tablet Take 1 tablet (4 mg total) by mouth every 8 (eight) hours as needed for muscle spasms. Do not drink alcohol or drive while taking this medication.  May cause drowsiness. 07/05/22   Volney American, PA-C    Family History Family History  Problem Relation Age of Onset   Colon cancer Father 16       deceased 6 months after diagnosis   Diabetes Brother     Social History Social History   Tobacco Use   Smoking status: Former    Packs/day: 0.50    Years: 30.00    Total pack years: 15.00    Types: Cigarettes    Quit date: 03/05/2007    Years since quitting: 15.3   Smokeless tobacco: Never  Vaping Use   Vaping Use: Never used  Substance Use Topics   Alcohol use: No     Comment: 2008   Drug use: No    Comment: 2008     Allergies   Dilaudid [hydromorphone] and Pregabalin   Review of Systems Review of Systems Per HPI  Physical Exam Triage Vital Signs ED Triage Vitals  Enc Vitals Group     BP 07/05/22 1704 Marland Kitchen)  169/108     Pulse Rate 07/05/22 1704 90     Resp 07/05/22 1704 20     Temp 07/05/22 1704 98.8 F (37.1 C)     Temp Source 07/05/22 1704 Oral     SpO2 07/05/22 1704 95 %     Weight --      Height --      Head Circumference --      Peak Flow --      Pain Score 07/05/22 1710 6     Pain Loc --      Pain Edu? --      Excl. in [place]? --    No data found.  Updated Vital Signs BP (!) 169/108 (BP Location: Right Arm)   Pulse 90   Temp 98.8 F (37.1 C) (Oral)   Resp 20   SpO2 95%   Visual Acuity Right Eye Distance:   Left Eye Distance:   Bilateral Distance:    Right Eye Near:   Left Eye Near:    Bilateral Near:     Physical Exam Vitals and nursing note reviewed.  Constitutional:      Appearance: Normal appearance.  HENT:     Head: Atraumatic.  Eyes:     Extraocular Movements: Extraocular movements intact.     Conjunctiva/sclera: Conjunctivae normal.  Cardiovascular:     Rate and Rhythm: Normal rate and regular rhythm.  Pulmonary:     Effort: Pulmonary effort is normal.     Breath sounds: Normal breath sounds. No wheezing or rales.  Musculoskeletal:        General: Tenderness present. No swelling. Normal range of motion.     Cervical back: Normal range of motion and neck supple.     Comments: Tenderness to palpation without bony abnormality to the periscapular region left upper back   Skin:    General: Skin is warm and dry.     Findings: No bruising or erythema.  Neurological:     General: No focal deficit present.     Mental Status: He is oriented to person, place, and time.  Psychiatric:        Mood and Affect: Mood normal.        Thought Content: Thought content normal.        Judgment: Judgment normal.       UC Treatments / Results  Labs (all labs ordered are listed, but only abnormal results are displayed) Labs Reviewed - No data to display  EKG   Radiology DG Scapula Left  Result Date: 07/05/2022 CLINICAL DATA:  Left scapular pain, fell 2 weeks ago EXAM: LEFT SCAPULA - 2+ VIEWS COMPARISON:  06/24/2022. FINDINGS: Frontal and lateral views of the left scapula are obtained. There are no acute displaced fractures. Severe glenohumeral joint osteoarthritis is noted, with marked osteophyte formation off the humeral head. Mild hypertrophic changes of the acromioclavicular joint. The soft tissues are unremarkable. There is a minimally displaced left anterior third rib fracture. IMPRESSION: 1. Minimally displaced left anterior third rib fracture, unchanged since previous rib series. 2. Degenerative changes of the left shoulder. Electronically Signed   By: Randa Ngo M.D.   On: 07/05/2022 17:54    Procedures Procedures (including critical care time)  Medications Ordered in UC Medications - No data to display  Initial Impression / Assessment and Plan / UC Course  I have reviewed the triage vital signs and the nursing notes.  Pertinent labs & imaging results that were available during my care of the  patient were reviewed by me and considered in my medical decision making (see chart for details).     X-ray of the left scapular region negative for any acute fractures, reassurance given, exam very reassuring today as well.  Will refill once more his pain medication and muscle relaxers and discussed heat, massage, rest.  Work note given.  Return for any worsening symptoms.  Final Clinical Impressions(s) / UC Diagnoses   Final diagnoses:  Upper back pain on left side     Discharge Instructions      Your x-ray today does not show any new fractures apart from the ones identified at your last visit.  I have refilled your muscle relaxer and pain medication, use a heating pad, muscle rubs,  massages to help additionally.    ED Prescriptions     Medication Sig Dispense Auth. Provider   HYDROcodone-acetaminophen (NORCO/VICODIN) 5-325 MG tablet Take 1 tablet by mouth 2 (two) times daily as needed for severe pain. 10 tablet Volney American, PA-C   tiZANidine (ZANAFLEX) 4 MG tablet Take 1 tablet (4 mg total) by mouth every 8 (eight) hours as needed for muscle spasms. Do not drink alcohol or drive while taking this medication.  May cause drowsiness. 15 tablet Volney American, [place]      I have reviewed the PDMP during this encounter.   Volney American, [place] 07/05/22 [phone]

## 2022-08-10 ENCOUNTER — Telehealth: Payer: Self-pay

## 2022-08-10 ENCOUNTER — Other Ambulatory Visit: Payer: Self-pay

## 2022-08-10 MED ORDER — OMEPRAZOLE 40 MG PO CPDR: 40.0000 mg | DELAYED_RELEASE_CAPSULE | Freq: Two times a day (BID) | ORAL | 1 refills | Status: DC

## 2022-08-10 NOTE — Telephone Encounter (Signed)
Pt called needing a refill on his omeprazole. Pt was last seen on 03/22/2022.

## 2022-08-10 NOTE — Telephone Encounter (Signed)
Rx was sent to [facility]. Pt was notified.

## 2023-01-01 ENCOUNTER — Other Ambulatory Visit: Payer: Self-pay | Admitting: Internal Medicine

## 2023-09-11 ENCOUNTER — Telehealth: Payer: Self-pay

## 2023-09-11 NOTE — Telephone Encounter (Signed)
 Attempted to call [facility] client enrolled from [facility]. Attempt to reach regarding a food referral. No answer, left message, requesting a return call. Also place information about our food market and [employer] program into the mail for client.   Francee Nodal RN [employer]

## 2023-10-14 ENCOUNTER — Other Ambulatory Visit: Payer: Self-pay

## 2023-10-14 ENCOUNTER — Emergency Department (HOSPITAL_COMMUNITY): Payer: Self-pay

## 2023-10-14 ENCOUNTER — Encounter (HOSPITAL_COMMUNITY): Payer: Self-pay | Admitting: Emergency Medicine

## 2023-10-14 ENCOUNTER — Inpatient Hospital Stay (HOSPITAL_COMMUNITY): Payer: Self-pay

## 2023-10-14 ENCOUNTER — Inpatient Hospital Stay (HOSPITAL_COMMUNITY)
Admission: EM | Admit: 2023-10-14 | Discharge: 2023-10-17 | DRG: 872 | Disposition: A | Discharge status: Home / Self Care | Payer: MEDICAID | Attending: Family Medicine | Admitting: Family Medicine

## 2023-10-14 DIAGNOSIS — N179 Acute kidney failure, unspecified: Secondary | ICD-10-CM

## 2023-10-14 DIAGNOSIS — M48061 Spinal stenosis, lumbar region without neurogenic claudication: Secondary | ICD-10-CM | POA: Diagnosis present

## 2023-10-14 DIAGNOSIS — E1165 Type 2 diabetes mellitus with hyperglycemia: Secondary | ICD-10-CM | POA: Diagnosis present

## 2023-10-14 DIAGNOSIS — K219 Gastro-esophageal reflux disease without esophagitis: Secondary | ICD-10-CM | POA: Diagnosis present

## 2023-10-14 DIAGNOSIS — I10 Essential (primary) hypertension: Secondary | ICD-10-CM | POA: Diagnosis present

## 2023-10-14 DIAGNOSIS — G4733 Obstructive sleep apnea (adult) (pediatric): Secondary | ICD-10-CM

## 2023-10-14 DIAGNOSIS — Z87891 Personal history of nicotine dependence: Secondary | ICD-10-CM

## 2023-10-14 DIAGNOSIS — N400 Enlarged prostate without lower urinary tract symptoms: Secondary | ICD-10-CM | POA: Diagnosis present

## 2023-10-14 DIAGNOSIS — Z79890 Hormone replacement therapy (postmenopausal): Secondary | ICD-10-CM

## 2023-10-14 DIAGNOSIS — I4510 Unspecified right bundle-branch block: Secondary | ICD-10-CM | POA: Diagnosis present

## 2023-10-14 DIAGNOSIS — E7800 Pure hypercholesterolemia, unspecified: Secondary | ICD-10-CM | POA: Diagnosis present

## 2023-10-14 DIAGNOSIS — Z9049 Acquired absence of other specified parts of digestive tract: Secondary | ICD-10-CM

## 2023-10-14 DIAGNOSIS — E890 Postprocedural hypothyroidism: Secondary | ICD-10-CM | POA: Diagnosis present

## 2023-10-14 DIAGNOSIS — E66813 Obesity, class 3: Secondary | ICD-10-CM | POA: Diagnosis present

## 2023-10-14 DIAGNOSIS — G2581 Restless legs syndrome: Secondary | ICD-10-CM | POA: Diagnosis present

## 2023-10-14 DIAGNOSIS — Z9081 Acquired absence of spleen: Secondary | ICD-10-CM

## 2023-10-14 DIAGNOSIS — A419 Sepsis, unspecified organism: Principal | ICD-10-CM | POA: Diagnosis present

## 2023-10-14 DIAGNOSIS — K047 Periapical abscess without sinus: Secondary | ICD-10-CM | POA: Diagnosis present

## 2023-10-14 DIAGNOSIS — A4159 Other Gram-negative sepsis: Principal | ICD-10-CM | POA: Diagnosis present

## 2023-10-14 DIAGNOSIS — E039 Hypothyroidism, unspecified: Secondary | ICD-10-CM | POA: Diagnosis present

## 2023-10-14 DIAGNOSIS — Z79899 Other long term (current) drug therapy: Secondary | ICD-10-CM

## 2023-10-14 DIAGNOSIS — F32A Depression, unspecified: Secondary | ICD-10-CM | POA: Diagnosis present

## 2023-10-14 DIAGNOSIS — Z7984 Long term (current) use of oral hypoglycemic drugs: Secondary | ICD-10-CM

## 2023-10-14 DIAGNOSIS — Z7951 Long term (current) use of inhaled steroids: Secondary | ICD-10-CM

## 2023-10-14 DIAGNOSIS — E2740 Unspecified adrenocortical insufficiency: Secondary | ICD-10-CM | POA: Diagnosis present

## 2023-10-14 DIAGNOSIS — R6520 Severe sepsis without septic shock: Secondary | ICD-10-CM

## 2023-10-14 DIAGNOSIS — Z833 Family history of diabetes mellitus: Secondary | ICD-10-CM

## 2023-10-14 DIAGNOSIS — N390 Urinary tract infection, site not specified: Secondary | ICD-10-CM | POA: Diagnosis present

## 2023-10-14 DIAGNOSIS — K589 Irritable bowel syndrome without diarrhea: Secondary | ICD-10-CM | POA: Diagnosis present

## 2023-10-14 DIAGNOSIS — Z6841 Body Mass Index (BMI) 40.0 and over, adult: Secondary | ICD-10-CM

## 2023-10-14 DIAGNOSIS — E876 Hypokalemia: Secondary | ICD-10-CM | POA: Diagnosis present

## 2023-10-14 LAB — COMPREHENSIVE METABOLIC PANEL WITH GFR
ALT: 17 U/L (ref 0–44)
ALT: 19 U/L (ref 0–44)
AST: 22 U/L (ref 15–41)
AST: 19 U/L (ref 15–41)
Albumin: 3.2 g/dL — ABNORMAL LOW (ref 3.5–5.0)
Albumin: 3 g/dL — ABNORMAL LOW (ref 3.5–5.0)
Alkaline Phosphatase: 54 U/L (ref 38–126)
Alkaline Phosphatase: 45 U/L (ref 38–126)
Anion gap: 12 (ref 5–15)
Anion gap: 12 (ref 5–15)
BUN: 18 mg/dL (ref 6–20)
BUN: 14 mg/dL (ref 6–20)
CO2: 17 mmol/L — ABNORMAL LOW (ref 22–32)
CO2: 19 mmol/L — ABNORMAL LOW (ref 22–32)
Calcium: 8.4 mg/dL — ABNORMAL LOW (ref 8.9–10.3)
Calcium: 8.6 mg/dL — ABNORMAL LOW (ref 8.9–10.3)
Chloride: 101 mmol/L (ref 98–111)
Chloride: 101 mmol/L (ref 98–111)
Creatinine, Ser: 0.97 mg/dL (ref 0.61–1.24)
Creatinine, Ser: 0.8 mg/dL (ref 0.61–1.24)
GFR, Estimated: >60 mL/min (ref >60)
GFR, Estimated: >60 mL/min (ref >60)
Glucose, Bld: 292 mg/dL — ABNORMAL HIGH (ref 70–99)
Glucose, Bld: 317 mg/dL — ABNORMAL HIGH (ref 70–99)
Potassium: 3 mmol/L — ABNORMAL LOW (ref 3.5–5.1)
Potassium: 3.6 mmol/L (ref 3.5–5.1)
Sodium: 130 mmol/L — ABNORMAL LOW (ref 135–145)
Sodium: 132 mmol/L — ABNORMAL LOW (ref 135–145)
Total Bilirubin: 0.4 mg/dL (ref 0.0–1.2)
Total Bilirubin: 0.6 mg/dL (ref 0.0–1.2)
Total Protein: 7 g/dL (ref 6.5–8.1)
Total Protein: 7.2 g/dL (ref 6.5–8.1)

## 2023-10-14 LAB — HIV ANTIBODY (ROUTINE TESTING W REFLEX): HIV Screen 4th Generation wRfx: NONREACTIVE

## 2023-10-14 LAB — CBC WITH DIFFERENTIAL/PLATELET
Abs Immature Granulocytes: 0.07 10*3/uL (ref 0.00–0.07)
Basophils Absolute: 0.1 10*3/uL (ref 0.0–0.1)
Basophils Relative: 0 %
Eosinophils Absolute: 0 10*3/uL (ref 0.0–0.5)
Eosinophils Relative: 0 %
HCT: 41.7 % (ref 39.0–52.0)
Hemoglobin: 14 g/dL (ref 13.0–17.0)
Immature Granulocytes: 1 %
Lymphocytes Relative: 5 %
Lymphs Abs: 0.7 10*3/uL (ref 0.7–4.0)
MCH: 26.1 pg (ref 26.0–34.0)
MCHC: 33.6 g/dL (ref 30.0–36.0)
MCV: 77.8 fL — ABNORMAL LOW (ref 80.0–100.0)
Monocytes Absolute: 0.6 10*3/uL (ref 0.1–1.0)
Monocytes Relative: 4 %
Neutro Abs: 12.8 10*3/uL — ABNORMAL HIGH (ref 1.7–7.7)
Neutrophils Relative %: 90 %
Platelets: 482 10*3/uL — ABNORMAL HIGH (ref 150–400)
RBC: 5.36 MIL/uL (ref 4.22–5.81)
RDW: 16.7 % — ABNORMAL HIGH (ref 11.5–15.5)
WBC: 14.2 10*3/uL — ABNORMAL HIGH (ref 4.0–10.5)
nRBC: 0.3 % — ABNORMAL HIGH (ref 0.0–0.2)

## 2023-10-14 LAB — PROTIME-INR
INR: 1 (ref 0.8–1.2)
INR: 1 (ref 0.8–1.2)
Prothrombin Time: 13.7 s (ref 11.4–15.2)
Prothrombin Time: 13.6 s (ref 11.4–15.2)

## 2023-10-14 LAB — RESP PANEL BY RT-PCR (RSV, FLU A&B, COVID)  RVPGX2
Influenza A by PCR: NEGATIVE
Influenza B by PCR: NEGATIVE
Resp Syncytial Virus by PCR: NEGATIVE
SARS Coronavirus 2 by RT PCR: NEGATIVE

## 2023-10-14 LAB — GLUCOSE, CAPILLARY
Glucose-Capillary: 201 mg/dL — ABNORMAL HIGH (ref 70–99)
Glucose-Capillary: 187 mg/dL — ABNORMAL HIGH (ref 70–99)

## 2023-10-14 LAB — LACTIC ACID, PLASMA
Lactic Acid, Venous: 2.8 mmol/L (ref 0.5–1.9)
Lactic Acid, Venous: 2.5 mmol/L (ref 0.5–1.9)
Lactic Acid, Venous: 2 mmol/L (ref 0.5–1.9)

## 2023-10-14 LAB — CBG MONITORING, ED
Glucose-Capillary: 339 mg/dL — ABNORMAL HIGH (ref 70–99)
Glucose-Capillary: 360 mg/dL — ABNORMAL HIGH (ref 70–99)

## 2023-10-14 LAB — TROPONIN I (HIGH SENSITIVITY)
Troponin I (High Sensitivity): 25 ng/L — ABNORMAL HIGH (ref <18)
Troponin I (High Sensitivity): 34 ng/L — ABNORMAL HIGH (ref <18)

## 2023-10-14 LAB — URINALYSIS, W/ REFLEX TO CULTURE (INFECTION SUSPECTED)
Bilirubin Urine: NEGATIVE
Glucose, UA: >=500 mg/dL — AB
Ketones, ur: 5 mg/dL — AB
Leukocytes,Ua: NEGATIVE
Nitrite: POSITIVE — AB
Protein, ur: 30 mg/dL — AB
Specific Gravity, Urine: 1.024 (ref 1.005–1.030)
WBC, UA: >50 WBC/hpf (ref 0–5)
pH: 5 (ref 5.0–8.0)

## 2023-10-14 LAB — PHOSPHORUS: Phosphorus: 2 mg/dL — ABNORMAL LOW (ref 2.5–4.6)

## 2023-10-14 LAB — LIPASE, BLOOD: Lipase: 20 U/L (ref 11–51)

## 2023-10-14 LAB — MAGNESIUM: Magnesium: 1.4 mg/dL — ABNORMAL LOW (ref 1.7–2.4)

## 2023-10-14 LAB — APTT: aPTT: 28 s (ref 24–36)

## 2023-10-14 MED ORDER — METRONIDAZOLE 500 MG/100ML IV SOLN
500.0000 mg | Freq: Two times a day (BID) | INTRAVENOUS | Status: DC
  Administered 2023-10-14: 500 mg via INTRAVENOUS
  Filled 2023-10-14: qty 100

## 2023-10-14 MED ORDER — LACTATED RINGERS IV BOLUS (SEPSIS)
1000.0000 mL | Freq: Once | INTRAVENOUS | Status: AC
  Administered 2023-10-14: 1000 mL via INTRAVENOUS

## 2023-10-14 MED ORDER — ONDANSETRON HCL 4 MG PO TABS: 4.0000 mg | ORAL_TABLET | Freq: Four times a day (QID) | ORAL | Status: DC | PRN

## 2023-10-14 MED ORDER — LACTATED RINGERS IV BOLUS (SEPSIS)
1000.0000 mL | Freq: Once | INTRAVENOUS | Status: AC
Start: 2023-10-14 — End: 2023-10-14
  Administered 2023-10-14: 1000 mL via INTRAVENOUS

## 2023-10-14 MED ORDER — CEFEPIME HCL 2 G IV SOLR: 2.0000 g | Freq: Once | INTRAVENOUS | Status: DC

## 2023-10-14 MED ORDER — SODIUM CHLORIDE 0.9 % IV SOLN
2.0000 g | Freq: Three times a day (TID) | INTRAVENOUS | Status: DC
  Administered 2023-10-14 – 2023-10-16 (×6): 2 g via INTRAVENOUS
  Filled 2023-10-14 (×6): qty 12.5

## 2023-10-14 MED ORDER — BISACODYL 5 MG PO TBEC: 5.0000 mg | DELAYED_RELEASE_TABLET | Freq: Every day | ORAL | Status: DC | PRN

## 2023-10-14 MED ORDER — TAMSULOSIN HCL 0.4 MG PO CAPS
0.4000 mg | ORAL_CAPSULE | Freq: Every morning | ORAL | Status: DC
  Administered 2023-10-14 – 2023-10-17 (×4): 0.4 mg via ORAL
  Filled 2023-10-14 (×4): qty 1

## 2023-10-14 MED ORDER — HEPARIN SODIUM (PORCINE) 5000 UNIT/ML IJ SOLN
5000.0000 [IU] | Freq: Three times a day (TID) | INTRAMUSCULAR | Status: DC
  Administered 2023-10-14 – 2023-10-17 (×9): 5000 [IU] via SUBCUTANEOUS
  Filled 2023-10-14 (×10): qty 1

## 2023-10-14 MED ORDER — VANCOMYCIN HCL IN DEXTROSE 1-5 GM/200ML-% IV SOLN
1000.0000 mg | Freq: Once | INTRAVENOUS | Status: AC
Start: 2023-10-14 — End: 2023-10-14
  Administered 2023-10-14: 1000 mg via INTRAVENOUS
  Filled 2023-10-14: qty 200

## 2023-10-14 MED ORDER — IPRATROPIUM BROMIDE 0.02 % IN SOLN: 0.5000 mg | Freq: Four times a day (QID) | RESPIRATORY_TRACT | Status: DC | PRN

## 2023-10-14 MED ORDER — LACTATED RINGERS IV BOLUS (SEPSIS)
200.0000 mL | Freq: Once | INTRAVENOUS | Status: AC
  Administered 2023-10-14: 200 mL via INTRAVENOUS

## 2023-10-14 MED ORDER — POTASSIUM CHLORIDE 10 MEQ/100ML IV SOLN
10.0000 meq | INTRAVENOUS | Status: AC
  Administered 2023-10-14 (×3): 10 meq via INTRAVENOUS
  Filled 2023-10-14 (×3): qty 100

## 2023-10-14 MED ORDER — LACTATED RINGERS IV BOLUS (SEPSIS): 200.0000 mL | Freq: Once | INTRAVENOUS | Status: DC

## 2023-10-14 MED ORDER — AMLODIPINE BESYLATE 5 MG PO TABS: 10.0000 mg | ORAL_TABLET | Freq: Every day | ORAL | Status: DC

## 2023-10-14 MED ORDER — SENNOSIDES-DOCUSATE SODIUM 8.6-50 MG PO TABS: 1.0000 | ORAL_TABLET | Freq: Every evening | ORAL | Status: DC | PRN

## 2023-10-14 MED ORDER — ACETAMINOPHEN 500 MG PO TABS
1000.0000 mg | ORAL_TABLET | Freq: Four times a day (QID) | ORAL | Status: DC | PRN
  Administered 2023-10-15 – 2023-10-16 (×4): 1000 mg via ORAL
  Filled 2023-10-14 (×4): qty 2

## 2023-10-14 MED ORDER — LEVOTHYROXINE SODIUM 75 MCG PO TABS
150.0000 ug | ORAL_TABLET | Freq: Every day | ORAL | Status: DC
  Administered 2023-10-15 – 2023-10-17 (×3): 150 ug via ORAL
  Filled 2023-10-14 (×3): qty 2

## 2023-10-14 MED ORDER — HYDROCORTISONE SOD SUC (PF) 100 MG IJ SOLR
100.0000 mg | Freq: Once | INTRAMUSCULAR | Status: AC
  Administered 2023-10-14: 100 mg via INTRAVENOUS
  Filled 2023-10-14: qty 2

## 2023-10-14 MED ORDER — ADULT MULTIVITAMIN W/MINERALS CH
1.0000 | ORAL_TABLET | Freq: Every day | ORAL | Status: DC
  Administered 2023-10-14 – 2023-10-17 (×4): 1 via ORAL
  Filled 2023-10-14 (×4): qty 1

## 2023-10-14 MED ORDER — INSULIN ASPART 100 UNIT/ML IJ SOLN
0.0000 [IU] | Freq: Three times a day (TID) | INTRAMUSCULAR | Status: DC
  Administered 2023-10-14: 20 [IU] via SUBCUTANEOUS
  Administered 2023-10-14: 15 [IU] via SUBCUTANEOUS
  Administered 2023-10-14: 7 [IU] via SUBCUTANEOUS
  Administered 2023-10-15 (×2): 11 [IU] via SUBCUTANEOUS
  Administered 2023-10-15: 7 [IU] via SUBCUTANEOUS
  Administered 2023-10-16: 15 [IU] via SUBCUTANEOUS
  Administered 2023-10-16: 20 [IU] via SUBCUTANEOUS
  Administered 2023-10-16 – 2023-10-17 (×3): 11 [IU] via SUBCUTANEOUS
  Filled 2023-10-14 (×2): qty 1

## 2023-10-14 MED ORDER — DULOXETINE HCL 60 MG PO CPEP
120.0000 mg | ORAL_CAPSULE | Freq: Every day | ORAL | Status: DC
  Administered 2023-10-14 – 2023-10-16 (×3): 120 mg via ORAL
  Filled 2023-10-14 (×3): qty 2

## 2023-10-14 MED ORDER — ZOLPIDEM TARTRATE 5 MG PO TABS: 5.0000 mg | ORAL_TABLET | Freq: Every evening | ORAL | Status: DC | PRN

## 2023-10-14 MED ORDER — IBUPROFEN 600 MG PO TABS
600.0000 mg | ORAL_TABLET | Freq: Four times a day (QID) | ORAL | Status: DC | PRN
  Administered 2023-10-14: 600 mg via ORAL
  Filled 2023-10-14: qty 1

## 2023-10-14 MED ORDER — METRONIDAZOLE 500 MG/100ML IV SOLN
500.0000 mg | Freq: Once | INTRAVENOUS | Status: AC
  Administered 2023-10-14: 500 mg via INTRAVENOUS
  Filled 2023-10-14: qty 100

## 2023-10-14 MED ORDER — ACETAMINOPHEN 650 MG RE SUPP: 650.0000 mg | Freq: Four times a day (QID) | RECTAL | Status: DC | PRN

## 2023-10-14 MED ORDER — LINAGLIPTIN 5 MG PO TABS
5.0000 mg | ORAL_TABLET | Freq: Every day | ORAL | Status: DC
  Administered 2023-10-14 – 2023-10-17 (×4): 5 mg via ORAL
  Filled 2023-10-14 (×4): qty 1

## 2023-10-14 MED ORDER — VANCOMYCIN HCL IN DEXTROSE 1-5 GM/200ML-% IV SOLN: 1000.0000 mg | Freq: Once | INTRAVENOUS | Status: DC

## 2023-10-14 MED ORDER — FLEET ENEMA RE ENEM
1.0000 | ENEMA | Freq: Once | RECTAL | Status: DC | PRN
Start: 2023-10-14 — End: 2023-10-17

## 2023-10-14 MED ORDER — LACTATED RINGERS IV BOLUS (SEPSIS): 1000.0000 mL | Freq: Once | INTRAVENOUS | Status: DC

## 2023-10-14 MED ORDER — LEVOTHYROXINE SODIUM 137 MCG PO TABS: 137.0000 ug | ORAL_TABLET | Freq: Every day | ORAL | Status: DC

## 2023-10-14 MED ORDER — SODIUM CHLORIDE 0.9% FLUSH
3.0000 mL | Freq: Two times a day (BID) | INTRAVENOUS | Status: DC
  Administered 2023-10-14 – 2023-10-17 (×7): 3 mL via INTRAVENOUS

## 2023-10-14 MED ORDER — SODIUM CHLORIDE 0.9 % IV SOLN
2.0000 g | Freq: Once | INTRAVENOUS | Status: AC
  Administered 2023-10-14: 2 g via INTRAVENOUS
  Filled 2023-10-14: qty 12.5

## 2023-10-14 MED ORDER — IBUPROFEN 600 MG PO TABS: 600.0000 mg | ORAL_TABLET | Freq: Four times a day (QID) | ORAL | Status: DC | PRN

## 2023-10-14 MED ORDER — HYDRALAZINE HCL 20 MG/ML IJ SOLN: 10.0000 mg | INTRAMUSCULAR | Status: DC | PRN

## 2023-10-14 MED ORDER — FLUTICASONE FUROATE-VILANTEROL 200-25 MCG/ACT IN AEPB: 1.0000 | INHALATION_SPRAY | Freq: Every day | RESPIRATORY_TRACT | Status: DC

## 2023-10-14 MED ORDER — LACTATED RINGERS IV SOLN
150.0000 mL/h | INTRAVENOUS | Status: AC
Start: 2023-10-14 — End: 2023-10-15
  Administered 2023-10-14 (×2): 150 mL/h via INTRAVENOUS

## 2023-10-14 MED ORDER — LACTATED RINGERS IV SOLN: INTRAVENOUS | Status: DC

## 2023-10-14 MED ORDER — HYDROCORTISONE 20 MG PO TABS
20.0000 mg | ORAL_TABLET | Freq: Every day | ORAL | Status: DC
  Administered 2023-10-14 – 2023-10-16 (×3): 20 mg via ORAL
  Filled 2023-10-14 (×4): qty 1

## 2023-10-14 MED ORDER — INSULIN GLARGINE-YFGN 100 UNIT/ML ~~LOC~~ SOLN
15.0000 [IU] | SUBCUTANEOUS | Status: DC
  Administered 2023-10-14: 15 [IU] via SUBCUTANEOUS
  Filled 2023-10-14 (×3): qty 0.15

## 2023-10-14 MED ORDER — SODIUM CHLORIDE 0.9% FLUSH
3.0000 mL | Freq: Two times a day (BID) | INTRAVENOUS | Status: DC
  Administered 2023-10-14 – 2023-10-17 (×6): 3 mL via INTRAVENOUS

## 2023-10-14 MED ORDER — ACETAMINOPHEN 325 MG PO TABS
650.0000 mg | ORAL_TABLET | Freq: Four times a day (QID) | ORAL | Status: DC | PRN
  Administered 2023-10-14: 650 mg via ORAL
  Filled 2023-10-14: qty 2

## 2023-10-14 MED ORDER — LEVALBUTEROL HCL 0.63 MG/3ML IN NEBU: 0.6300 mg | INHALATION_SOLUTION | Freq: Four times a day (QID) | RESPIRATORY_TRACT | Status: DC | PRN

## 2023-10-14 MED ORDER — OXYCODONE HCL 5 MG PO TABS
5.0000 mg | ORAL_TABLET | ORAL | Status: DC | PRN
  Administered 2023-10-14 – 2023-10-16 (×2): 5 mg via ORAL
  Filled 2023-10-14 (×2): qty 1

## 2023-10-14 MED ORDER — ROSUVASTATIN CALCIUM 20 MG PO TABS
20.0000 mg | ORAL_TABLET | Freq: Every day | ORAL | Status: DC
  Administered 2023-10-14 – 2023-10-17 (×4): 20 mg via ORAL
  Filled 2023-10-14 (×4): qty 1

## 2023-10-14 MED ORDER — VANCOMYCIN HCL IN DEXTROSE 1-5 GM/200ML-% IV SOLN
1000.0000 mg | Freq: Two times a day (BID) | INTRAVENOUS | Status: DC
  Administered 2023-10-14: 1000 mg via INTRAVENOUS
  Filled 2023-10-14: qty 200

## 2023-10-14 MED ORDER — PANTOPRAZOLE SODIUM 40 MG PO TBEC
40.0000 mg | DELAYED_RELEASE_TABLET | Freq: Every day | ORAL | Status: DC
  Administered 2023-10-14 – 2023-10-17 (×4): 40 mg via ORAL
  Filled 2023-10-14 (×4): qty 1

## 2023-10-14 MED ORDER — VANCOMYCIN HCL IN DEXTROSE 1-5 GM/200ML-% IV SOLN
1000.0000 mg | Freq: Once | INTRAVENOUS | Status: AC
  Administered 2023-10-14: 1000 mg via INTRAVENOUS
  Filled 2023-10-14: qty 200

## 2023-10-14 MED ORDER — ONDANSETRON HCL 4 MG/2ML IJ SOLN: 4.0000 mg | Freq: Four times a day (QID) | INTRAMUSCULAR | Status: DC | PRN

## 2023-10-14 NOTE — ED Triage Notes (Addendum)
 Pt bib EMS after he called them for generalized unwell feeling. EMS reports that pt was diaphoretic upon their arrival, had temp of 103, and HR of 140. Pt reported to EMS that he had an infected tooth pulled approximately 2 weeks ago and has been on 2 different antibiotics. States is currently on day 3 of 2 dose. Pt reports last dose of Ibuprofen  was at 11am yesterday. Dr Harless Lien @ bedside.

## 2023-10-14 NOTE — Hospital Course (Signed)
 [name] [name] is a 58 year old male with history of HTN, DM 2, adrenal insufficiency, hypothyroidism, with recent treatment of dental infection presented with fatigue malaise and fever. Currently patient was recently on antibiotics for dental infection  ED evaluation: Upon arrival Tmax 101.4, pulse as high as 130, RR 30, 98% on CPAP Blood pressure 115/72, pulse 98, temperature 98.7 F (37.1 C), temperature source Oral, resp. rate 19, height 5\' 7"  (1.702 m), weight 115.7 kg, SpO2 98%.  WBC 14.2, sodium 130, potassium 3.0, calcium  25, 34, lactic acid 2.8, 2.5,  On arrival patient was found febrile tachypneic, tachycardic, with leukocytosis elevated lactic acids.  Met sepsis criteria therefore per sepsis protocol IV fluid resuscitation with broad-spectrum antibiotics and steroids was initiated  Patient will be admitted for sepsis.  Source of infection possible UTI, ruling out dental cavity, abscess.  Obtaining CT maxillary

## 2023-10-14 NOTE — ED Notes (Signed)
 Patient transported to CT

## 2023-10-14 NOTE — Assessment & Plan Note (Signed)
 Continue home dose statin.

## 2023-10-14 NOTE — Assessment & Plan Note (Signed)
 Body mass index is 39.94 kg/m. Discussed and counseled regarding increased activity, exercise, weight loss program, close follow-up with PCP with weight loss program regiments

## 2023-10-14 NOTE — Assessment & Plan Note (Signed)
-   Supplemental oxygen, CPAP nightly

## 2023-10-14 NOTE — H&P (Addendum)
 History and Physical   Patient: [name] [name]                            PCP: Minus Amel, MD                    DOB: [birth year]            DOA: 10/14/2023 RUE:[id number]             DOS: 10/14/2023, 8:27 AM  Minus Amel, MD  Patient coming from:   HOME  I have personally reviewed patient's medical records, in electronic medical records, including:  [facility] [place] link, and care everywhere.    Chief Complaint:   Chief Complaint  Patient presents with   Code Sepsis    History of present illness:    [name] [name] is a 58 year old male with history of HTN, DM 2, adrenal insufficiency, hypothyroidism, with recent treatment of dental infection presented with fatigue malaise and fever. Currently patient was recently on antibiotics for dental infection  ED evaluation: Upon arrival Tmax 101.4, pulse as high as 130, RR 30, 98% on CPAP Blood pressure 115/72, pulse 98, temperature 98.7 F (37.1 C), temperature source Oral, resp. rate 19, height 5\' 7"  (1.702 m), weight 115.7 kg, SpO2 98%.  WBC 14.2, sodium 130, potassium 3.0, calcium  25, 34, lactic acid 2.8, 2.5,  On arrival patient was found febrile tachypneic, tachycardic, with leukocytosis elevated lactic acids.  Met sepsis criteria therefore per sepsis protocol IV fluid resuscitation with broad-spectrum antibiotics and steroids was initiated  Patient will be admitted for sepsis.  Source of infection possible UTI, ruling out dental cavity, abscess.  Obtaining CT maxillary     Patient Denies having:  Cough, SOB, Chest Pain, Abd pain, N/V/D, headache, dizziness, lightheadedness,  Dysuria, Joint pain, rash, open wounds     Review of Systems: As per HPI, otherwise 10 point review of systems were negative.   ----------------------------------------------------------------------------------------------------------------------  Allergies  Allergen Reactions   Dilaudid  [Hydromorphone ] Other (See Comments)    "It makes me go crazy."  Per patient    Pregabalin     Other reaction(s): anaphylaxis    Home MEDs:  Prior to Admission medications   Medication Sig Start Date End Date Taking? Authorizing Provider  acetaminophen  (TYLENOL ) 325 MG tablet Take 2 tablets (650 mg total) by mouth every 6 (six) hours as needed for moderate pain. 06/24/22   Adolph Hoop, PA-C  albuterol  (VENTOLIN  HFA) 108 (90 Base) MCG/ACT inhaler Inhale 2 puffs into the lungs every 4 (four) hours as needed for wheezing or shortness of breath. 05/20/22   Corbin Dess, PA-C  amLODipine  (NORVASC ) 10 MG tablet Take 1 tablet (10 mg total) by mouth daily. 01/05/22   Johnson, Clanford L, MD  azithromycin  (ZITHROMAX ) 250 MG tablet Take first 2 tablets together, then 1 every day until finished. 05/20/22   Corbin Dess, PA-C  blood glucose meter kit and supplies Dispense based on patient and insurance preference. Use up to four times daily as directed. (FOR ICD-10 E10.9, E11.9). 01/05/22   Johnson, Clanford L, MD  budesonide -formoterol  (SYMBICORT ) 160-4.5 MCG/ACT inhaler Inhale 2 puffs into the lungs 2 (two) times daily. 05/20/22   Corbin Dess, PA-C  cholecalciferol  (VITAMIN D3) 25 MCG (1000 UNIT) tablet Take 1,000 Units by mouth daily.    [provider]  DULoxetine  (CYMBALTA ) 60 MG capsule Take 120 mg by mouth at bedtime.  [provider]  glimepiride (AMARYL) 4 MG tablet Take 4 mg by mouth 2 (two) times daily. 12/22/21   [provider]  guaiFENesin  (MUCINEX ) 600 MG 12 hr tablet Take 1 tablet (600 mg total) by mouth 2 (two) times daily. 05/20/22   Corbin Dess, PA-C  HYDROcodone -acetaminophen  (NORCO/VICODIN) 5-325 MG tablet Take 1 tablet by mouth 2 (two) times daily as needed for severe pain. 07/05/22   Corbin Dess, PA-C  hydrocortisone  (CORTEF ) 20 MG tablet Take 1 tablet (20 mg total) by mouth daily. For cortisol replacement 01/05/22   Rayfield Cairo, MD  levothyroxine  (SYNTHROID ) 137 MCG  tablet Take 137 mcg by mouth daily before breakfast.    [provider]  levothyroxine  (SYNTHROID ) 150 MCG tablet Take 150 mcg by mouth every morning. Patient not taking: Reported on 03/22/2022 09/30/19   [provider]  losartan  (COZAAR ) 100 MG tablet Take 100 mg by mouth daily. 04/01/17   [provider]  metFORMIN (GLUCOPHAGE) 1000 MG tablet Take 1,000 mg by mouth 2 (two) times daily. 10/21/17   [provider]  Multiple Vitamins-Minerals (MENS MULTIVITAMIN PLUS) TABS Take 1 tablet by mouth daily. /For nutritional supplementation. 08/23/11   Viktoria Gray, MD  Omega-3 Fatty Acids (OMEGA-3 FISH OIL  PO) Take 1 capsule by mouth at bedtime.     [provider]  omeprazole  (PRILOSEC) 40 MG capsule Take 1 capsule (40 mg total) by mouth 2 (two) times daily. 01/04/23   Lanney Pitts, PA-C  ondansetron  (ZOFRAN -ODT) 4 MG disintegrating tablet TAKE 1 TABLET BY MOUTH EVERY 8 HOURS AS NEEDED FOR NAUSEA OR VOMITING. 04/23/22   Rourk, Windsor Hatcher, MD  promethazine -dextromethorphan (PROMETHAZINE -DM) 6.25-15 MG/5ML syrup Take 5 mLs by mouth 4 (four) times daily as needed. 05/20/22   Corbin Dess, PA-C  rosuvastatin  (CRESTOR ) 20 MG tablet Take 1 tablet (20 mg total) by mouth daily. 01/17/22   Swinyer, Leilani Punter, NP  sitaGLIPtin (JANUVIA) 100 MG tablet Take 100 mg by mouth at bedtime.     [provider]  tamsulosin  (FLOMAX ) 0.4 MG CAPS capsule Take 0.4 mg by mouth in the morning.     [provider]  tiZANidine  (ZANAFLEX ) 4 MG tablet Take 1 tablet (4 mg total) by mouth every 8 (eight) hours as needed for muscle spasms. Do not drink alcohol or drive while taking this medication.  May cause drowsiness. 07/05/22   Corbin Dess, PA-C    PRN MEDs: acetaminophen  **OR** acetaminophen , bisacodyl, hydrALAZINE, ipratropium, levalbuterol, ondansetron  **OR** ondansetron  (ZOFRAN ) IV, oxyCODONE , senna-docusate, sodium phosphate, zolpidem   Past  Medical History:  Diagnosis Date   Arthritis    Asthma    as child   Back pain, chronic 02/18/11   BULGING DISK   Complication of anesthesia    "woke up during spleenectomy at [facility]".   Diabetes mellitus without complication (HCC)    GERD (gastroesophageal reflux disease)    erosive reflux esophagitis, small HH   Hemorrhoids    History of kidney stones    Hyperlipemia    Hypertension    Hypothyroidism    IBS (irritable bowel syndrome)    Low serum cortisol level    Dr Arley Bending   Lower extremity edema    Nephrolithiasis 12/19/2012   Plantar fasciitis    Restless leg syndrome    S/P colonoscopy 08/2008   int/ext hemorrhoids. friable, rectal polyp   S/P endoscopy 01/10/09   Dr Davonna Estes, small [facility]   Sleep apnea    cpap  Spinal stenosis of lumbar region 12/18/2012   Thyroid  disease    Tubular adenoma of colon 2000   UTI (lower urinary tract infection)     Past Surgical History:  Procedure Laterality Date   BIOPSY  02/26/2022   Procedure: BIOPSY;  Surgeon: Vinetta Greening, DO;  [facility];  Service: Endoscopy;;   CHOLECYSTECTOMY  2006   COLONOSCOPY  2000   Dr. Riley Cheadle- tubular adenoma   COLONOSCOPY  08/10/2008   Dr. Riley Cheadle-  IMPRESSION:  Prominent external anal canal hemorrhoids, hyperplastic polyp   COLONOSCOPY N/A 10/08/2013   [id number] colonic polyps -  removed as described above/Prominent grade 3 hemorrhoids -  likely source of hematochezia. tubular adenomas. next TCS 10/2016   COLONOSCOPY WITH PROPOFOL  N/A 02/25/2017   Procedure: COLONOSCOPY WITH PROPOFOL ;  Surgeon: Suzette Espy, MD;  Grade 4 internal hemorrhoids, 1 tubular adenoma, recommendations to repeat in 5 years.    COLONOSCOPY WITH PROPOFOL  N/A 02/26/2022   Procedure: COLONOSCOPY WITH PROPOFOL ;  Surgeon: Vinetta Greening, DO;  [facility];  Service: Endoscopy;  Laterality: N/A;  9:45am, asa 3, ASAP   CYSTOSCOPY     with utetheral dilation   DISTAL BICEPS TENDON REPAIR  02/20/2012    Procedure: DISTAL BICEPS TENDON REPAIR;  Surgeon: Genevie Kerns, MD;  Location: [facility];  Service: Orthopedics;  Laterality: Left;  left elbow distal biceps tendon repair   ESOPHAGOGASTRODUODENOSCOPY  2010   Dr. Riley Cheadle: normal   ESOPHAGOGASTRODUODENOSCOPY (EGD) WITH PROPOFOL  N/A 02/26/2022   Procedure: ESOPHAGOGASTRODUODENOSCOPY (EGD) WITH PROPOFOL ;  Surgeon: Vinetta Greening, DO;  [facility];  Service: Endoscopy;  Laterality: N/A;   FLEXIBLE SIGMOIDOSCOPY N/A 11/10/2013   Dr.Rourk- internal/external hemorrhoids. external hemorrhoids grade 3 with some residual prolapsing tissue. normal rectum aside form hemorrhoids. s/p band placement.   HEMORRHOID BANDING N/A 11/10/2013   Procedure: HEMORRHOID BANDING;  Surgeon: Suzette Espy, MD;  [facility];  Service: Endoscopy;  Laterality: N/A;   HERNIA REPAIR     INCISIONAL HERNIA REPAIR N/A 04/10/2017   Procedure: Estrella Hench HERNIORRHAPHY WITH MESH;  Surgeon: Alanda Allegra, MD;  Location: [facility];  Service: General;  Laterality: N/A;   KNEE SURGERY     left   POLYPECTOMY  02/25/2017   Procedure: POLYPECTOMY - ASCENDING COLON;  Surgeon: Suzette Espy, MD;  [facility];  Service: Endoscopy;;   POLYPECTOMY  02/26/2022   Procedure: POLYPECTOMY;  Surgeon: Vinetta Greening, DO;  [facility];  Service: Endoscopy;;  gastric ; colon   SPLENECTOMY  2003   itp     reports that he quit smoking about 16 years ago. His smoking use included cigarettes. He started smoking about 46 years ago. He has a 15 pack-year smoking history. He has never used smokeless tobacco. He reports that he does not drink alcohol and does not use drugs.   Family History  Problem Relation Age of Onset   Colon cancer Father 72       deceased 6 months after diagnosis   Diabetes Brother     Physical Exam:   Vitals:   10/14/23 0718 10/14/23 0719 10/14/23 0730 10/14/23 0800  BP:   124/82 107/62  Pulse:   94 94  Resp:    (!) 25 (!) 30  Temp: 98.7 F (37.1 C)     TempSrc: Oral     SpO2:  98% 96% 95%  Weight:      Height:  Constitutional: NAD, calm, comfortable Eyes: PERRL, lids and conjunctivae normal ENMT: Poor dentition,  mucous membranes are moist. Posterior pharynx clear of any exudate or lesions.Normal dentition.  Neck: normal, supple, no masses, no thyromegaly Respiratory: clear to auscultation bilaterally, no wheezing, no crackles. Normal respiratory effort. No accessory muscle use.  Cardiovascular: Regular rate and rhythm, no murmurs / rubs / gallops. No extremity edema. 2+ pedal pulses. No carotid bruits.  Abdomen: no tenderness, no masses palpated. No hepatosplenomegaly. Bowel sounds positive.  Musculoskeletal: no clubbing / cyanosis. No joint deformity upper and lower extremities. Good ROM, no contractures. Normal muscle tone.  Neurologic: CN II-XII grossly intact. Sensation intact, DTR normal. Strength 5/5 in all 4.  Psychiatric: Normal judgment and insight. Alert and oriented x 3. Normal mood.  Skin: no rashes, lesions, ulcers. No induration Decubitus/ulcers:  Wounds: per nursing documentation         Labs on admission:    I have personally reviewed following labs and imaging studies  CBC: Recent Labs  Lab 10/14/23 0310  WBC 14.2*  NEUTROABS 12.8*  HGB 14.0  HCT 41.7  MCV 77.8*  PLT 482*   Basic Metabolic Panel: Recent Labs  Lab 10/14/23 0310  NA 130*  K 3.0*  CL 101  CO2 17*  GLUCOSE 292*  BUN 18  CREATININE 0.97  CALCIUM  8.4*   GFR: Estimated Creatinine Clearance: 103.3 mL/min (by C-G formula based on SCr of 0.97 mg/dL). Liver Function Tests: Recent Labs  Lab 10/14/23 0310  AST 22  ALT 17  ALKPHOS 54  BILITOT 0.4  PROT 7.0  ALBUMIN 3.2*   Recent Labs  Lab 10/14/23 0310  LIPASE 20   No results for input(s): "AMMONIA" in the last 168 hours. Coagulation Profile: Recent Labs  Lab 10/14/23 0310  INR 1.0    CBG: Recent Labs  Lab  10/14/23 0822  GLUCAP 339*    Urine analysis:    Component Value Date/Time   COLORURINE YELLOW 10/14/2023 0607   APPEARANCEUR HAZY (A) 10/14/2023 0607   LABSPEC 1.024 10/14/2023 0607   PHURINE 5.0 10/14/2023 0607   GLUCOSEU >=500 (A) 10/14/2023 0607   HGBUR SMALL (A) 10/14/2023 0607   BILIRUBINUR NEGATIVE 10/14/2023 0607   KETONESUR 5 (A) 10/14/2023 0607   PROTEINUR 30 (A) 10/14/2023 0607   UROBILINOGEN 0.2 12/16/2012 1820   NITRITE POSITIVE (A) 10/14/2023 0607   LEUKOCYTESUR NEGATIVE 10/14/2023 0607    Last A1C:  Lab Results  Component Value Date   HGBA1C 10.6 (H) 01/03/2022     Radiologic Exams on Admission:   CT ABDOMEN PELVIS WO CONTRAST Result Date: 10/14/2023 CLINICAL DATA:  Sepsis EXAM: CT ABDOMEN AND PELVIS WITHOUT CONTRAST TECHNIQUE: Multidetector CT imaging of the abdomen and pelvis was performed following the standard protocol without IV contrast. RADIATION DOSE REDUCTION: This exam was performed according to the departmental dose-optimization program which includes automated exposure control, adjustment of the mA and/or kV according to patient size and/or use of iterative reconstruction technique. COMPARISON:  12/08/2021 FINDINGS: Lower chest:  Coronary atherosclerosis. Hepatobiliary: Large caudate lobe without surface lobulation.Cholecystectomy. Pancreas: Unremarkable. Spleen: Unremarkable. Adrenals/Urinary Tract: Negative adrenals. No hydronephrosis or ureteral stone. Small left upper pole renal calculus. Unremarkable bladder. Stomach/Bowel:  No obstruction. No visible bowel inflammation Vascular/Lymphatic: No acute vascular abnormality. Scattered atheromatous calcification no mass or adenopathy. Reproductive:Mild enlargement of the prostate with dystrophic type calcification. Other: No ascites or pneumoperitoneum. Musculoskeletal: No acute abnormalities. Generalized lumbar spine degeneration. IMPRESSION: No evidence of intra-abdominal infection or abscess.  Atherosclerosis and  left nephrolithiasis. Electronically Signed   By: Ronnette Coke M.D.   On: 10/14/2023 05:36   DG Chest Port 1 View Result Date: 10/14/2023 CLINICAL DATA:  Initial evaluation for possible sepsis. EXAM: PORTABLE CHEST 1 VIEW COMPARISON:  Prior radiograph from 06/24/2022 FINDINGS: Cardiomegaly.  Mediastinal silhouette within normal limits. Lungs are hypoinflated with elevation of the right hemidiaphragm. Underlying perihilar vascular and interstitial prominence, suggesting a degree of pulmonary interstitial congestion. No consolidative airspace disease. No visible pleural effusion. No pneumothorax. Visualized soft tissues and osseous structures demonstrate no acute finding. Osteoarthritic changes noted about the shoulders. IMPRESSION: 1. Cardiomegaly with diffuse vascular and interstitial prominence, suggesting mild pulmonary interstitial congestion/edema. 2. Underlying low lung volumes with chronic elevation of the right hemidiaphragm. Electronically Signed   By: Virgia Griffins M.D.   On: 10/14/2023 04:10    EKG:   Independently reviewed.  Orders placed or performed during the hospital encounter of 10/14/23   EKG 12-Lead   EKG 12-Lead   ED EKG   ED EKG   EKG 12-Lead   ---------------------------------------------------------------------------------------------------------------------------------------    Assessment / Plan:   Principal Problem:   Sepsis (HCC) Active Problems:   Sepsis due to urinary tract infection (HCC)   Uncontrolled type 2 diabetes mellitus with hyperglycemia (HCC)   Essential hypertension   Status post splenectomy   OSA on CPAP   RBBB   GERD   Irritable bowel syndrome   Depression   Hypokalemia   Hypothyroidism   Spinal stenosis of lumbar region   Urinary tract infection   Obesity, Class III, BMI 40-49.9 (morbid obesity)   Pure hypercholesterolemia   Assessment and Plan: * Sepsis (HCC) POA: Upon arrival Tmax 101.4, pulse as  high as 130, RR 30, 98% on CPAP POA: WBC 14.2, sodium 130, potassium 3.0, calcium  25, 34, lactic acid 2.8, 2.5, POA:  Met sepsis criteria therefore per sepsis protocol IV fluid resuscitation with broad-spectrum antibiotics and steroids was initiated  Patient will be admitted for sepsis to tele floor.  Source of infection possible UTI, ruling out dental cavity, abscess.  Obtaining CT maxillary Follow-up labs, cultures -Continue broad-spectrum antibiotics for now (cefepime, Flagyl, vancomycin) Hemodynamics stable now  Sepsis due to urinary tract infection (HCC) - Sepsis likely source UTI -Although dental abscess cavity not ruled out -Continue broad-spectrum antibiotics -Will follow-up with urine and blood cultures  Type 2 diabetes mellitus without complications (HCC) - Holding home medication of Amaryl,  Essential hypertension Blood pressure borderline-reviewing home BP meds, will resume judiciously - Will resume home medication of Norvasc , will hold losartan  for now  Uncontrolled type 2 diabetes mellitus with hyperglycemia (HCC) - When holding home diabetic regimen (including Amaryl, metformin and Januvia) - Checking CBG q. ACHS, SSI coverage - Anticipating hyperglycemia with IV steroids   RBBB Monitoring,  OSA on CPAP - Supplemental oxygen, CPAP nightly  Pure hypercholesterolemia - Continue home dose statin  Obesity, Class III, BMI 40-49.9 (morbid obesity) Body mass index is 39.94 kg/m. Discussed and counseled regarding increased activity, exercise, weight loss program, close follow-up with PCP with weight loss program regiments  Hypothyroidism Continue home dose Synthroid   Hypokalemia Repleted orally, monitoring  Depression Currently stable reviewed home meds and resume accordingly  Irritable bowel syndrome Currently stable, monitoring  GERD Continue PPI      Consults called:   None -------------------------------------------------------------------------------------------------------------------------------------------- DVT prophylaxis:  heparin  injection 5,000 Units Start: 10/14/23 1000 TED hose Start: 10/14/23 0750 SCDs Start: 10/14/23 0750   Code Status:   Code Status: Full Code  Admission status: Patient will be admitted as Inpatient, with a greater than 2 midnight length of stay. Level of care: Telemetry   Family Communication:  none at bedside  (The above findings and plan of care has been discussed with patient in detail, the patient expressed understanding and agreement of above plan)  --------------------------------------------------------------------------------------------------------------------------------------------------  Disposition Plan:  Anticipated 1-2 days Status is: Inpatient Remains inpatient appropriate because: Sepsis criteria, needing IV antibiotics IV fluids     ----------------------------------------------------------------------------------------------------------------------------------------------------  Time spent:  72  Min.  Was spent seeing and evaluating the patient, reviewing all medical records, drawn plan of care.  SIGNED: Bobbetta Burnet, MD, FHM. FAAFP. [place] [facility], Pager  (Please use [email or web address] to page/ or secure chat through epic) If 7PM-7AM, please contact night-coverage [email or web address],  10/14/2023, 8:27 AM

## 2023-10-14 NOTE — Assessment & Plan Note (Addendum)
-   When holding home diabetic regimen (including Amaryl, metformin and Januvia) - Checking CBG q. ACHS, SSI coverage - Anticipating hyperglycemia with IV steroids

## 2023-10-14 NOTE — Assessment & Plan Note (Signed)
Currently stable, monitoring

## 2023-10-14 NOTE — Progress Notes (Signed)
   10/14/23 1758  Vitals  Temp (!) 103.2 F (39.6 C)  Temp Source Oral  BP (!) 141/78  MAP (mmHg) 96  BP Location Right Arm  BP Method Automatic  Patient Position (if appropriate) Lying  Pulse Rate (!) 120  Pulse Rate Source Dinamap  Level of Consciousness  Level of Consciousness Alert  MEWS COLOR  MEWS Score Color Red  Oxygen Therapy  SpO2 92 %  O2 Device Room Air  MEWS Score  MEWS Temp 2  MEWS Systolic 0  MEWS Pulse 2  MEWS RR 0  MEWS LOC 0  MEWS Score 4   1758: VS as above, Tylenol  had been administered. MD Shahmehdi notified. 1845: Temp still 103.1, HR remains 120's ST. MD Shahmehdi updated, orders received. 1927: Pt has been up to bathroom for bowel movement, then had pan bath due to sweating. Pt with HR up to 136/min & RR 32/min with activity. Pt back in bed now,  HR 120's, b/p 103/81, RR 28/min, Temp  102.7 oral.  Pt tolerating oral fluids well. Had had oral ibuprofen  per order as well.

## 2023-10-14 NOTE — Assessment & Plan Note (Signed)
 Repleted orally, monitoring

## 2023-10-14 NOTE — Progress Notes (Signed)
 Pharmacy Antibiotic Note  [name] [name] is a 58 y.o. male admitted on 10/14/2023 with sepsis.  Pharmacy has been consulted for vancomycin and cefepime dosing.  Patient febrile to 101.4, wbc elevated at 14. Renal function is normal.   Plan Vancomycin 1000 mg IV Q 12 hrs. Goal AUC 400-550. Expected AUC: 490 SCr used: 0.97 Cefepime 2g q8 hours Follow up cultures for possible de-escalation Follow up renal function for as needed adjustments   Height: 5\' 7"  (170.2 cm) Weight: 115.7 kg (255 lb) IBW/kg (Calculated) : 66.1  Temp (24hrs), Avg:100.1 F (37.8 C), Min:98.7 F (37.1 C), Max:101.4 F (38.6 C)  Recent Labs  Lab 10/14/23 0310 10/14/23 0515  WBC 14.2*  --   CREATININE 0.97  --   LATICACIDVEN 2.8* 2.5*    Estimated Creatinine Clearance: 103.3 mL/min (by C-G formula based on SCr of 0.97 mg/dL).    Allergies  Allergen Reactions   Dilaudid  [Hydromorphone ] Other (See Comments)    "It makes me go crazy." Per patient    Pregabalin     Other reaction(s): anaphylaxis    Thank you for allowing pharmacy to be a part of this patient's care.  Audra Blend PharmD., BCPS Clinical Pharmacist 10/14/2023 8:39 AM

## 2023-10-14 NOTE — Assessment & Plan Note (Signed)
 Monitoring,

## 2023-10-14 NOTE — Assessment & Plan Note (Signed)
 Continue PPI ?

## 2023-10-14 NOTE — Sepsis Progress Note (Signed)
Elink following sepsis protocol.  

## 2023-10-14 NOTE — Progress Notes (Signed)
 Pt arrived to room 331 from ED via stretcher. Ambulated from stretcher to bed. Oriented to room and safety precautions, states understanding. A&O, denies c/o pain, SOB or n/v at this time. VSS. Call bell within reach.

## 2023-10-14 NOTE — Assessment & Plan Note (Signed)
 -  Continue home dose Synthroid

## 2023-10-14 NOTE — Progress Notes (Signed)
   10/14/23 2300  BiPAP/CPAP/SIPAP  BiPAP/CPAP/SIPAP Pt Type Adult  BiPAP/CPAP/SIPAP DREAMSTATIOND  Mask Type Nasal mask  Mask Size Medium  Respiratory Rate 17 breaths/min  EPAP 12 cmH2O  Flow Rate 4 lpm  Patient Home Machine No  Patient Home Mask No  Patient Home Tubing No  Auto Titrate No  Device Plugged into RED Power Outlet Yes  BiPAP/CPAP /SiPAP Vitals  Pulse Rate 85  SpO2 93 %  Bilateral Breath Sounds Diminished  MEWS Score/Color  MEWS Score 0  MEWS Score Color Marrie Sizer

## 2023-10-14 NOTE — Assessment & Plan Note (Addendum)
 Blood pressure borderline-reviewing home BP meds, will resume judiciously - Will resume home medication of Norvasc , will hold losartan  for now

## 2023-10-14 NOTE — TOC CM/SW Note (Signed)
 Transition of Care [facility]) - Inpatient Brief Assessment   Patient Details  Name: [name] [name] MRN: [id number] Date of Birth: 15-Sep-1965  Transition of Care [facility]) CM/SW Contact:    Juanda Noon Phone Number: 10/14/2023, 7:57 AM   Clinical Narrative:  Pending PT evaluation. Patient has no insurance.   Transition of Care Department [facility] has reviewed patient and no TOC needs have been identified at this time. We will continue to monitor patient advancement through interdisciplinary progression rounds. If new patient transition needs arise, please place a TOC consult.  Transition of Care Asessment: Insurance and Status: Selfpay Patient has primary care physician: Yes Home environment has been reviewed: Single family home Prior level of function:: Independent Prior/Current Home Services: No current home services Social Drivers of Health Review: SDOH reviewed no interventions necessary Readmission risk has been reviewed: Yes Transition of care needs: transition of care needs identified, TOC will continue to follow

## 2023-10-14 NOTE — ED Provider Notes (Signed)
 [facility]-EMERGENCY DEPT [facility] Emergency Department Provider Note MRN:  [id number]  Arrival date & time: 10/14/23     Chief Complaint   Code Sepsis   History of Present Illness   [name] [name] is a 58 y.o. year-old male with a history of diabetes presenting to the ED with chief complaint of fever.  General malaise and fatigue and low energy, unable to get out of bed for the past 3 days, unable to really eat or drink much, very low appetite.  Having some chest discomfort, denies cough, no burning with urination, denies abdominal pain.  Fever up to 103 this evening upon EMS arrival.  Reportedly has been on multiple rounds of antibiotics recently for tooth infection.  Was feeling too ill to take the antibiotics over the past few days.  Denies any significant tooth pain at this time.  Review of Systems  A thorough review of systems was obtained and all systems are negative except as noted in the HPI and PMH.   Patient's Health History    Past Medical History:  Diagnosis Date   Arthritis    Asthma    as child   Back pain, chronic 02/18/11   BULGING DISK   Complication of anesthesia    "woke up during spleenectomy at [facility]".   Diabetes mellitus without complication (HCC)    GERD (gastroesophageal reflux disease)    erosive reflux esophagitis, small HH   Hemorrhoids    History of kidney stones    Hyperlipemia    Hypertension    Hypothyroidism    IBS (irritable bowel syndrome)    Low serum cortisol level    Dr Arley Bending   Lower extremity edema    Nephrolithiasis 12/19/2012   Plantar fasciitis    Restless leg syndrome    S/P colonoscopy 08/2008   int/ext hemorrhoids. friable, rectal polyp   S/P endoscopy 01/10/09   Dr Davonna Estes, small [facility]   Sleep apnea    cpap   Spinal stenosis of lumbar region 12/18/2012   Thyroid  disease    Tubular adenoma of colon 2000   UTI (lower urinary tract infection)     Past Surgical History:  Procedure Laterality Date   BIOPSY  02/26/2022    Procedure: BIOPSY;  Surgeon: Vinetta Greening, DO;  Location: [facility] ENDO [facility];  Service: Endoscopy;;   CHOLECYSTECTOMY  2006   COLONOSCOPY  2000   Dr. Riley Cheadle- tubular adenoma   COLONOSCOPY  08/10/2008   Dr. Riley Cheadle-  IMPRESSION:  Prominent external anal canal hemorrhoids, hyperplastic polyp   COLONOSCOPY N/A 10/08/2013   WUJ:[id number] colonic polyps -  removed as described above/Prominent grade 3 hemorrhoids -  likely source of hematochezia. tubular adenomas. next TCS 10/2016   COLONOSCOPY WITH PROPOFOL  N/A 02/25/2017   Procedure: COLONOSCOPY WITH PROPOFOL ;  Surgeon: Suzette Espy, MD;  Grade 4 internal hemorrhoids, 1 tubular adenoma, recommendations to repeat in 5 years.    COLONOSCOPY WITH PROPOFOL  N/A 02/26/2022   Procedure: COLONOSCOPY WITH PROPOFOL ;  Surgeon: Vinetta Greening, DO;  Location: [facility];  Service: Endoscopy;  Laterality: N/A;  9:45am, asa 3, ASAP   CYSTOSCOPY     with utetheral dilation   DISTAL BICEPS TENDON REPAIR  02/20/2012   Procedure: DISTAL BICEPS TENDON REPAIR;  Surgeon: Genevie Kerns, MD;  Location: [facility];  Service: Orthopedics;  Laterality: Left;  left elbow distal biceps tendon repair   ESOPHAGOGASTRODUODENOSCOPY  2010   Dr. Riley Cheadle: normal   ESOPHAGOGASTRODUODENOSCOPY (EGD) WITH PROPOFOL  N/A  02/26/2022   Procedure: ESOPHAGOGASTRODUODENOSCOPY (EGD) WITH PROPOFOL ;  Surgeon: Vinetta Greening, DO;  Location: [facility];  Service: Endoscopy;  Laterality: N/A;   FLEXIBLE SIGMOIDOSCOPY N/A 11/10/2013   Dr.Rourk- internal/external hemorrhoids. external hemorrhoids grade 3 with some residual prolapsing tissue. normal rectum aside form hemorrhoids. s/p band placement.   HEMORRHOID BANDING N/A 11/10/2013   Procedure: HEMORRHOID BANDING;  Surgeon: Suzette Espy, MD;  Location: [facility];  Service: Endoscopy;  Laterality: N/A;   HERNIA REPAIR     INCISIONAL HERNIA REPAIR N/A 04/10/2017   Procedure: Estrella Hench HERNIORRHAPHY WITH MESH;  Surgeon: Alanda Allegra, MD;  Location: [facility] ORS;  Service: General;  Laterality: N/A;   KNEE SURGERY     left   POLYPECTOMY  02/25/2017   Procedure: POLYPECTOMY - ASCENDING COLON;  Surgeon: Suzette Espy, MD;  Location: [facility];  Service: Endoscopy;;   POLYPECTOMY  02/26/2022   Procedure: POLYPECTOMY;  Surgeon: Vinetta Greening, DO;  Location: [facility];  Service: Endoscopy;;  gastric ; colon   SPLENECTOMY  2003   itp    Family History  Problem Relation Age of Onset   Colon cancer Father 71       deceased 6 months after diagnosis   Diabetes Brother     Social History   Socioeconomic History   Marital status: Married    Spouse name: Not on file   Number of children: 1   Years of education: Not on file   Highest education level: Not on file  Occupational History   Occupation: truck driver-local     Employer: [employer]  Tobacco Use   Smoking status: Former    Current packs/day: 0.00    Average packs/day: 0.5 packs/day for 30.0 years (15.0 ttl pk-yrs)    Types: Cigarettes    Start date: 03/04/1977    Quit date: 03/05/2007    Years since quitting: 16.6   Smokeless tobacco: Never  Vaping Use   Vaping status: Never Used  Substance and Sexual Activity   Alcohol use: No    Comment: 2008   Drug use: No    Comment: 2008   Sexual activity: Yes  Other Topics Concern   Not on file  Social History Narrative   1 healthy son   Social Drivers of Corporate investment banker Strain: Not on file  Food Insecurity: Not on file  Transportation Needs: Not on file  Physical Activity: Not on file  Stress: Not on file  Social Connections: Unknown (10/16/2021)   Received from [facility]   Social Network    Social Network: Not on file  Intimate Partner Violence: Unknown (09/07/2021)   Received from [employer] [facility], [facility]   HITS    Physically Hurt: Not on file    Insult or Talk Down To: Not on file    Threaten Physical Harm: Not on file    Scream or Curse: Not on file      Physical Exam   Vitals:   10/14/23 0600 10/14/23 0630  BP: 103/71 115/79  Pulse: 100 95  Resp: 16   Temp:    SpO2: 97% 98%    CONSTITUTIONAL: Ill-appearing, NAD NEURO/PSYCH:  Alert and oriented x 3, no focal deficits EYES:  eyes equal and reactive ENT/NECK:  no LAD, no JVD CARDIO: Tachycardic rate, well-perfused, normal S1 and S2 PULM:  CTAB no wheezing or rhonchi GI/GU:  non-distended, non-tender MSK/SPINE:  No gross deformities, no edema SKIN:  no rash, atraumatic   *Additional and/or pertinent findings included in MDM below  Diagnostic and Interventional Summary    EKG Interpretation Date/Time:  Monday Oct 14 2023 02:36:19 EDT Ventricular Rate:  132 PR Interval:  140 QRS Duration:  89 QT Interval:  308 QTC Calculation: 457 R Axis:   -57  Text Interpretation: Sinus tachycardia Left anterior fascicular block Abnormal R-wave progression, late transition Borderline T wave abnormalities Confirmed by Gwenetta Lennert [phone]) on 10/14/2023 3:28:15 AM       Labs Reviewed  LACTIC ACID, PLASMA - Abnormal; Notable for the following components:      Result Value   Lactic Acid, Venous 2.8 (*)    All other components within normal limits  LACTIC ACID, PLASMA - Abnormal; Notable for the following components:   Lactic Acid, Venous 2.5 (*)    All other components within normal limits  COMPREHENSIVE METABOLIC PANEL WITH GFR - Abnormal; Notable for the following components:   Sodium 130 (*)    Potassium 3.0 (*)    CO2 17 (*)    Glucose, Bld 292 (*)    Calcium  8.4 (*)    Albumin 3.2 (*)    All other components within normal limits  CBC WITH DIFFERENTIAL/PLATELET - Abnormal; Notable for the following components:   WBC 14.2 (*)    MCV 77.8 (*)    RDW 16.7 (*)    Platelets 482 (*)    nRBC 0.3 (*)    Neutro Abs 12.8 (*)    All other components within normal limits  URINALYSIS, W/ REFLEX TO CULTURE (INFECTION SUSPECTED) - Abnormal; Notable for the following components:    APPearance HAZY (*)    Glucose, UA >=500 (*)    Hgb urine dipstick SMALL (*)    Ketones, ur 5 (*)    Protein, ur 30 (*)    Nitrite POSITIVE (*)    Bacteria, UA RARE (*)    All other components within normal limits  TROPONIN I (HIGH SENSITIVITY) - Abnormal; Notable for the following components:   Troponin I (High Sensitivity) 25 (*)    All other components within normal limits  TROPONIN I (HIGH SENSITIVITY) - Abnormal; Notable for the following components:   Troponin I (High Sensitivity) 34 (*)    All other components within normal limits  RESP PANEL BY RT-PCR (RSV, FLU A&B, COVID)  RVPGX2  CULTURE, BLOOD (ROUTINE X 2)  CULTURE, BLOOD (ROUTINE X 2)  URINE CULTURE  PROTIME-INR  LIPASE, BLOOD  LACTIC ACID, PLASMA    CT ABDOMEN PELVIS WO CONTRAST  Final Result    DG Chest Port 1 View  Final Result      Medications  lactated ringers  infusion ( Intravenous Bolus from Bag 10/14/23 0358)  potassium chloride  10 mEq in 100 mL IVPB (10 mEq Intravenous New Bag/Given 10/14/23 0634)  lactated ringers  bolus 1,000 mL (0 mLs Intravenous Stopped 10/14/23 0344)    And  lactated ringers  bolus 1,000 mL (0 mLs Intravenous Stopped 10/14/23 0430)    And  lactated ringers  bolus 200 mL (0 mLs Intravenous Stopped 10/14/23 0356)  ceFEPIme (MAXIPIME) 2 g in sodium chloride  0.9 % 100 mL IVPB (0 g Intravenous Stopped 10/14/23 0349)  metroNIDAZOLE (FLAGYL) IVPB 500 mg (0 mg Intravenous Stopped 10/14/23 0450)  vancomycin (VANCOCIN) IVPB 1000 mg/200 mL premix (0 mg Intravenous Stopped 10/14/23 0446)    And  vancomycin (VANCOCIN) IVPB 1000 mg/200 mL premix (0 mg Intravenous Stopped 10/14/23 0531)  hydrocortisone  sodium succinate  (SOLU-CORTEF ) 100 MG injection  100 mg (100 mg Intravenous Given 10/14/23 1610)     Procedures  /  Critical Care .Critical Care  Performed by: Edson Graces, MD Authorized by: Edson Graces, MD   Critical care provider statement:    Critical care time (minutes):  30   Critical care  was necessary to treat or prevent imminent or life-threatening deterioration of the following conditions:  Sepsis   Critical care was time spent personally by me on the following activities:  Development of treatment plan with patient or surrogate, discussions with consultants, evaluation of patient's response to treatment, examination of patient, ordering and review of laboratory studies, ordering and review of radiographic studies, ordering and performing treatments and interventions, pulse oximetry, re-evaluation of patient's condition and review of old charts   ED Course and Medical Decision Making  Initial Impression and Ddx Concern for sepsis.  On arrival temperature 101.4, heart rate 130, respirations 27, requiring 1 or 2 L nasal cannula.  Soft blood pressure 104/55.  Providing 30 cc/kg bolus, empiric antibiotics, unclear source and so broad coverage.  Past medical/surgical history that increases complexity of ED encounter: Diabetes  Interpretation of Diagnostics I personally reviewed the EKG and my interpretation is as follows: Sinus tachycardia  Labs reveal mild elevation in leukocytes, lactic acid, hypokalemia  Patient Reassessment and Ultimate Disposition/Management     Workup suggestive of urinary source of sepsis, vitals improving with resuscitation and antibiotics, admitted to hospital service.  Patient management required discussion with the following services or consulting groups:  Hospitalist Service  Complexity of Problems Addressed Acute illness or injury that poses threat of life of bodily function  Additional Data Reviewed and Analyzed Further history obtained from: Further history from spouse/family member  Additional Factors Impacting ED Encounter Risk Consideration of hospitalization  Merrick Abe. Harless Lien, MD [facility] Emergency Medicine [facility] [email or web address]  Final Clinical Impressions(s) / ED Diagnoses     ICD-10-CM   1. Sepsis,  due to unspecified organism, unspecified whether acute organ dysfunction present [facility])  A41.9       ED Discharge Orders     None        Discharge Instructions Discussed with and Provided to Patient:   Discharge Instructions   None      Edson Graces, MD 10/14/23 [phone]

## 2023-10-14 NOTE — Assessment & Plan Note (Signed)
-   Holding home medication of Amaryl,

## 2023-10-14 NOTE — Assessment & Plan Note (Signed)
 Currently stable reviewed home meds and resume accordingly

## 2023-10-14 NOTE — Assessment & Plan Note (Signed)
-   Sepsis likely source UTI -Although dental abscess cavity not ruled out -Continue broad-spectrum antibiotics -Will follow-up with urine and blood cultures

## 2023-10-14 NOTE — Assessment & Plan Note (Signed)
 POA: Upon arrival Tmax 101.4, pulse as high as 130, RR 30, 98% on CPAP POA: WBC 14.2, sodium 130, potassium 3.0, calcium  25, 34, lactic acid 2.8, 2.5, POA:  Met sepsis criteria therefore per sepsis protocol IV fluid resuscitation with broad-spectrum antibiotics and steroids was initiated  Patient will be admitted for sepsis to tele floor.  Source of infection possible UTI, ruling out dental cavity, abscess.  Obtaining CT maxillary Follow-up labs, cultures -Continue broad-spectrum antibiotics for now (cefepime, Flagyl, vancomycin) Hemodynamics stable now

## 2023-10-15 LAB — BLOOD CULTURE ID PANEL (REFLEXED) - BCID2

## 2023-10-15 LAB — HEMOGLOBIN A1C
Hgb A1c MFr Bld: 12.3 % — ABNORMAL HIGH (ref 4.8–5.6)
Mean Plasma Glucose: 306 mg/dL

## 2023-10-15 LAB — CBC
HCT: 36.4 % — ABNORMAL LOW (ref 39.0–52.0)
Hemoglobin: 11.9 g/dL — ABNORMAL LOW (ref 13.0–17.0)
MCH: 25.6 pg — ABNORMAL LOW (ref 26.0–34.0)
MCHC: 32.7 g/dL (ref 30.0–36.0)
MCV: 78.4 fL — ABNORMAL LOW (ref 80.0–100.0)
Platelets: 381 10*3/uL (ref 150–400)
RBC: 4.64 MIL/uL (ref 4.22–5.81)
RDW: 16.9 % — ABNORMAL HIGH (ref 11.5–15.5)
WBC: 12.7 10*3/uL — ABNORMAL HIGH (ref 4.0–10.5)
nRBC: 0 % (ref 0.0–0.2)

## 2023-10-15 LAB — BASIC METABOLIC PANEL WITH GFR
Anion gap: 8 (ref 5–15)
BUN: 14 mg/dL (ref 6–20)
CO2: 26 mmol/L (ref 22–32)
Calcium: 8.5 mg/dL — ABNORMAL LOW (ref 8.9–10.3)
Chloride: 101 mmol/L (ref 98–111)
Creatinine, Ser: 0.87 mg/dL (ref 0.61–1.24)
GFR, Estimated: >60 mL/min (ref >60)
Glucose, Bld: 241 mg/dL — ABNORMAL HIGH (ref 70–99)
Potassium: 4 mmol/L (ref 3.5–5.1)
Sodium: 135 mmol/L (ref 135–145)

## 2023-10-15 LAB — GLUCOSE, CAPILLARY
Glucose-Capillary: 227 mg/dL — ABNORMAL HIGH (ref 70–99)
Glucose-Capillary: 294 mg/dL — ABNORMAL HIGH (ref 70–99)
Glucose-Capillary: 291 mg/dL — ABNORMAL HIGH (ref 70–99)
Glucose-Capillary: 277 mg/dL — ABNORMAL HIGH (ref 70–99)

## 2023-10-15 LAB — URINE CULTURE: Culture: NO GROWTH

## 2023-10-15 LAB — LACTIC ACID, PLASMA: Lactic Acid, Venous: 2.1 mmol/L (ref 0.5–1.9)

## 2023-10-15 MED ORDER — LACTATED RINGERS IV SOLN
150.0000 mL/h | INTRAVENOUS | Status: AC
  Administered 2023-10-15: 150 mL/h via INTRAVENOUS

## 2023-10-15 MED ORDER — INSULIN GLARGINE-YFGN 100 UNIT/ML ~~LOC~~ SOLN
25.0000 [IU] | Freq: Every day | SUBCUTANEOUS | Status: DC
  Administered 2023-10-16 – 2023-10-17 (×2): 25 [IU] via SUBCUTANEOUS
  Filled 2023-10-15 (×3): qty 0.25

## 2023-10-15 MED ORDER — INSULIN GLARGINE-YFGN 100 UNIT/ML ~~LOC~~ SOLN
20.0000 [IU] | Freq: Every day | SUBCUTANEOUS | Status: DC
  Filled 2023-10-15 (×2): qty 0.2

## 2023-10-15 NOTE — Plan of Care (Signed)

## 2023-10-15 NOTE — Evaluation (Signed)
 Physical Therapy Evaluation Patient Details Name: ALEXUS KACZANOWSKI MRN: [id number] DOB: [birth year] Today's Date: 10/15/2023  History of Present Illness  JAIVAN URE is a 58 year old male with history of HTN, DM 2, adrenal insufficiency, hypothyroidism, with recent treatment of dental infection presented with fatigue malaise and fever.  Currently patient was recently on antibiotics for dental infection. On arrival patient was found febrile tachypneic, tachycardic, with leukocytosis elevated lactic acids.  Met sepsis criteria therefore per sepsis protocol IV fluid resuscitation with broad-spectrum antibiotics and steroids was initiated. Patient will be admitted for sepsis. Source of infection possible UTI, ruling out dental cavity, abscess.   Clinical Impression  Patient tolerated PT/OT evaluation well on this date. At baseline, patient reports independence with ADLs, iADLs, and mobility, with no use of any assistive equipment. Patient was received supine in bed with CPAP donned. Patient was mod(I)-independent with supine>sit, STS, bed>chair, toilet transfer, and ambulation on this date with no overt LOB or unsteadiness to note t/o. Some mild SOB noted at end of ambulation trial. Patient reports having good support at home and RW, Rollator, and SPCs if needed. Patient was left in recliner with call button within reach at end of session. Patient does not present with urgent need for skilled physical therapy at this time. Patient discharged to care of nursing for ambulation daily as tolerated for length of stay.      If plan is discharge home, recommend the following: Assist for transportation   Can travel by private vehicle        Equipment Recommendations None recommended by PT  Recommendations for Other Services       Functional Status Assessment Patient has had a recent decline in their functional status and demonstrates the ability to make significant improvements in function in a reasonable and  predictable amount of time.     Precautions / Restrictions Precautions Precautions: None Recall of Precautions/Restrictions: Intact Restrictions Weight Bearing Restrictions Per Provider Order: No      Mobility  Bed Mobility Overal bed mobility: Modified Independent, Independent       General bed mobility comments: Mod(I) due to [facility] being elevated but per clinical judgement, would be (I) with HOB flat    Transfers Overall transfer level: Independent Equipment used: None       General transfer comment: (I) with STS, bed>chair, and toilet transfers    Ambulation/Gait Ambulation/Gait assistance: Independent Gait Distance (Feet): 250 Feet Assistive device: None Gait Pattern/deviations: WFL(Within Functional Limits) Gait velocity: WNL     General Gait Details: Pt demo mild SOB towards end of ambulation but reports feeling near his baseline.  Stairs      Wheelchair Mobility     Tilt Bed    Modified Rankin (Stroke Patients Only)       Balance Overall balance assessment: No apparent balance deficits (not formally assessed)         Pertinent Vitals/Pain Pain Assessment Pain Assessment: No/denies pain    Home Living Family/patient expects to be discharged to:: Private residence Living Arrangements: Spouse/significant other Available Help at Discharge: Available 24 hours/day;Family Type of Home: House Home Access: Stairs to enter Entrance Stairs-Rails: Can reach both;Left;Right Entrance Stairs-Number of Steps: 6   Home Layout: One level Home Equipment: Agricultural consultant (2 wheels);Rollator (4 wheels);Cane - quad;Grab bars - tub/shower      Prior Function Prior Level of Function : Independent/Modified Independent       Mobility Comments: Community ambulator, no AD use. Hasnt worked or driven since October ADLs  Comments: (I) with all ADLs and iADLs     Extremity/Trunk Assessment   Upper Extremity Assessment Upper Extremity Assessment: Defer to OT  evaluation    Lower Extremity Assessment Lower Extremity Assessment: Overall WFL for tasks assessed    Cervical / Trunk Assessment Cervical / Trunk Assessment: Normal  Communication   Communication Communication: No apparent difficulties    Cognition Arousal: Alert Behavior During Therapy: WFL for tasks assessed/performed   PT - Cognitive impairments: No apparent impairments         Following commands: Intact       Cueing Cueing Techniques: Verbal cues     General Comments      Exercises     Assessment/Plan    PT Assessment Patient does not need any further PT services  PT Problem List         PT Treatment Interventions      PT Goals (Current goals can be found in the Care Plan section)  Acute Rehab PT Goals Patient Stated Goal: Return home PT Goal Formulation: With patient Time For Goal Achievement: 10/18/23 Potential to Achieve Goals: Good    Frequency       Co-evaluation PT/OT/SLP Co-Evaluation/Treatment: Yes Reason for Co-Treatment: To address functional/ADL transfers PT goals addressed during session: Mobility/safety with mobility         AM-PAC PT "6 Clicks" Mobility  Outcome Measure Help needed turning from your back to your side while in a flat bed without using bedrails?: None Help needed moving from lying on your back to sitting on the side of a flat bed without using bedrails?: None Help needed moving to and from a bed to a chair (including a wheelchair)?: None Help needed standing up from a chair using your arms (e.g., wheelchair or bedside chair)?: None Help needed to walk in hospital room?: None Help needed climbing 3-5 steps with a railing? : None 6 Click Score: 24    End of Session   Activity Tolerance: Patient tolerated treatment well Patient left: in chair;with call bell/phone within reach   PT Visit Diagnosis: Other abnormalities of gait and mobility (R26.89)    Time: 8119-1478 PT Time Calculation (min) (ACUTE ONLY):  22 min   Charges:   PT Evaluation $PT Eval Moderate Complexity: 1 Mod PT Treatments $Therapeutic Activity: 23-37 mins PT General Charges $$ ACUTE PT VISIT: 1 Visit         10:08 AM, 10/15/23 Marysue Sola, PT, DPT [place] with [facility]

## 2023-10-15 NOTE — Progress Notes (Signed)
   10/15/23 2223  BiPAP/CPAP/SIPAP  BiPAP/CPAP/SIPAP Pt Type Adult  BiPAP/CPAP/SIPAP DREAMSTATIOND  Mask Type Nasal mask (patient's home mask)  Respiratory Rate 18 breaths/min  EPAP 12 cmH2O  FiO2 (%) 21 %  Patient Home Machine No  Patient Home Mask Yes  Patient Home Tubing Yes  Auto Titrate No  Device Plugged into RED Power Outlet Yes  BiPAP/CPAP /SiPAP Vitals  Pulse Rate 92  SpO2 91 %  Bilateral Breath Sounds Clear;Diminished  MEWS Score/Color  MEWS Score 0  MEWS Score Color [name] [name]

## 2023-10-15 NOTE — Progress Notes (Signed)
 PROGRESS NOTE    Patient: [name] [name]                            PCP: Minus Amel, MD                    DOB: [birth year]            DOA: 10/14/2023 ZOX:[id number]             DOS: 10/15/2023, 1:16 PM   LOS: 1 day   Date of Service: The patient was seen and examined on 10/15/2023  Subjective:   The patient was seen and examined this morning, stable no acute distress Tmax 103.2 over night,  hemodynamically stable this morning  Brief Narrative:   AAHAN BARRESE is a 58 year old male with history of HTN, DM 2, adrenal insufficiency, hypothyroidism, with recent treatment of dental infection presented with fatigue malaise and fever. Currently patient was recently on antibiotics for dental infection  ED evaluation: Upon arrival Tmax 101.4, pulse as high as 130, RR 30, 98% on CPAP Blood pressure 115/72, pulse 98, temperature 98.7 F (37.1 C), temperature source Oral, resp. rate 19, height 5\' 7"  (1.702 m), weight 115.7 kg, SpO2 98%.  WBC 14.2, sodium 130, potassium 3.0, calcium  25, 34, lactic acid 2.8, 2.5,  On arrival patient was found febrile tachypneic, tachycardic, with leukocytosis elevated lactic acids.  Met sepsis criteria therefore per sepsis protocol IV fluid resuscitation with broad-spectrum antibiotics and steroids was initiated  Patient will be admitted for sepsis.  Source of infection possible UTI, ruling out dental cavity, abscess.  Obtaining CT maxillary     Assessment & Plan:   Principal Problem:   Sepsis (HCC) Active Problems:   Sepsis due to urinary tract infection (HCC)   Uncontrolled type 2 diabetes mellitus with hyperglycemia (HCC)   Essential hypertension   Status post splenectomy   OSA on CPAP   RBBB   GERD   Irritable bowel syndrome   Depression   Hypokalemia   Hypothyroidism   Spinal stenosis of lumbar region   Urinary tract infection   Obesity, Class III, BMI 40-49.9 (morbid obesity)   Pure hypercholesterolemia     Assessment and Plan: *  Sepsis (HCC) -UTI-possible source of infection  - Tmax 103.2 last night - hemodynamically stable this morning.  POA: Upon arrival Tmax 101.4, pulse as high as 130, RR 30, 98% on CPAP POA: WBC 14.2, sodium 130, potassium 3.0, calcium  25, 34, lactic acid 2.8, 2.5, POA:  Met sepsis criteria therefore per sepsis protocol IV fluid resuscitation with broad-spectrum antibiotics and steroids was initiated  Patient will be admitted for sepsis to tele floor.  Source of infection possible UTI,  CT facial maxillary was obtain and reviewed - ruled out abscess  -Blood cultures growing Klebsiella, Enterobacter -10/14/23 broad-spectrum antibiotics (cefepime, Flagyl, vancomycin) - 10/15/23 >> D/Ced vancomycin and Flagyl, continue cefepime  Sepsis due to urinary tract infection (HCC) - Sepsis likely source UTI - Urine culture no growth today, blood culture Klebsiella pneumonia/Enterobacter - Discontinue vancomycin/Flagyl, continue cefepime - Surveillance blood cultures to be obtained 10/16/2023  Type 2 diabetes mellitus without complications (HCC) - Holding home medication of Amaryl,  Essential hypertension - Blood pressure borderline-reviewing home BP meds, will resume judiciously - Will resume home medication of Norvasc , will hold losartan  for now  Uncontrolled type 2 diabetes mellitus with hyperglycemia (HCC) Stable, monitor - When holding home diabetic regimen (including Amaryl,  metformin and Januvia) - Checking CBG q. ACHS, SSI coverage - Anticipating hyperglycemia with IV steroids   RBBB- Monitoring,  OSA on CPAP - Supplemental oxygen, CPAP nightly  Pure hypercholesterolemia - Continue home dose statin  Hypothyroidism - Continue home dose Synthroid   Hypokalemia - Repleted orally, monitoring  Depression -stable continue Cymbalta   Irritable bowel syndrome -Currently stable, monitoring  GERD -Continue PPI  BPH-continue Flomax   Chronic adrenal insufficiency-continue home medication  of hydrocortisone     Obesity, Class III, BMI 40-49.9 (morbid obesity) Body mass index is 39.94 kg/m. Discussed and counseled regarding increased activity, exercise, weight loss program, close follow-up with PCP with weight loss program regiments        ------------------------------------------------------------------------------------------------------------ Nutritional status:  The patient's BMI is: Body mass index is 44.02 kg/m. I agree with the assessment and plan as outlined   Cultures; Blood Cultures x 2 >> Enterobacter/Klebsiella pneumonia Surveillance blood culture will be obtained 10/16/2023>>  Urine Culture  >>> no growth to date    ------------------------------------------------------------------------------------------------------------------------------------------------  DVT prophylaxis:  heparin  injection 5,000 Units Start: 10/14/23 1000 TED hose Start: 10/14/23 0750 SCDs Start: 10/14/23 0750   Code Status:   Code Status: Full Code  Family Communication: No family member present at bedside- attempt will be made to update daily The above findings and plan of care has been discussed with patient (and family)  in detail,  they expressed understanding and agreement of above. -Advance care planning has been discussed.   Admission status:   Status is: Inpatient Remains inpatient appropriate because: Meeting sepsis criteria, therefore needing IV antibiotics, IV fluids   Disposition: From  - home             Planning for discharge in 2 days: to Home    Procedures:   No admission procedures for hospital encounter.   Antimicrobials:  Anti-infectives (From admission, onward)    Start     Dose/Rate Route Frequency Ordered Stop   10/14/23 1600  metroNIDAZOLE (FLAGYL) IVPB 500 mg  Status:  Discontinued        500 mg 100 mL/hr over 60 Minutes Intravenous Every 12 hours 10/14/23 0752 10/14/23 2102   10/14/23 1600  vancomycin (VANCOCIN) IVPB 1000 mg/200 mL  premix  Status:  Discontinued        1,000 mg 200 mL/hr over 60 Minutes Intravenous Every 12 hours 10/14/23 0834 10/14/23 2102   10/14/23 1200  ceFEPIme (MAXIPIME) 2 g in sodium chloride  0.9 % 100 mL IVPB        2 g 200 mL/hr over 30 Minutes Intravenous Every 8 hours 10/14/23 0834 10/21/23 1159   10/14/23 0800  ceFEPIme (MAXIPIME) 2 g in sodium chloride  0.9 % 100 mL IVPB  Status:  Discontinued        2 g 200 mL/hr over 30 Minutes Intravenous  Once 10/14/23 0752 10/14/23 0812   10/14/23 0800  vancomycin (VANCOCIN) IVPB 1000 mg/200 mL premix  Status:  Discontinued        1,000 mg 200 mL/hr over 60 Minutes Intravenous  Once 10/14/23 0752 10/14/23 0759   10/14/23 0400  vancomycin (VANCOCIN) IVPB 1000 mg/200 mL premix       Placed in "And" Linked Group   1,000 mg 200 mL/hr over 60 Minutes Intravenous  Once 10/14/23 0255 10/14/23 0531   10/14/23 0300  ceFEPIme (MAXIPIME) 2 g in sodium chloride  0.9 % 100 mL IVPB        2 g 200 mL/hr over 30 Minutes Intravenous  Once 10/14/23 0247  10/14/23 0349   10/14/23 0300  metroNIDAZOLE (FLAGYL) IVPB 500 mg        500 mg 100 mL/hr over 60 Minutes Intravenous  Once 10/14/23 0247 10/14/23 0450   10/14/23 0300  vancomycin (VANCOCIN) IVPB 1000 mg/200 mL premix  Status:  Discontinued        1,000 mg 200 mL/hr over 60 Minutes Intravenous  Once 10/14/23 0247 10/14/23 0253   10/14/23 0300  vancomycin (VANCOCIN) IVPB 1000 mg/200 mL premix       Placed in "And" Linked Group   1,000 mg 200 mL/hr over 60 Minutes Intravenous  Once 10/14/23 0255 10/14/23 0446        Medication:   DULoxetine   120 mg Oral QHS   heparin   5,000 Units Subcutaneous Q8H   hydrocortisone   20 mg Oral Daily   insulin  aspart  0-20 Units Subcutaneous TID WC   insulin  glargine-yfgn  15 Units Subcutaneous Q24H   levothyroxine   150 mcg Oral QAC breakfast   linagliptin  5 mg Oral Daily   multivitamin with minerals  1 tablet Oral Daily   pantoprazole   40 mg Oral Daily   rosuvastatin   20 mg  Oral Daily   sodium chloride  flush  3 mL Intravenous Q12H   sodium chloride  flush  3 mL Intravenous Q12H   tamsulosin   0.4 mg Oral q AM    acetaminophen , bisacodyl, hydrALAZINE, ibuprofen , ipratropium, levalbuterol, ondansetron  **OR** ondansetron  (ZOFRAN ) IV, oxyCODONE , senna-docusate, sodium phosphate, zolpidem    Objective:   Vitals:   10/15/23 0055 10/15/23 0550 10/15/23 0611 10/15/23 1234  BP: (!) 102/51 103/73  115/75  Pulse: 79 74  84  Resp: 19 17    Temp: 97.8 F (36.6 C) 97.6 F (36.4 C)  98.6 F (37 C)  TempSrc: Oral Oral  Oral  SpO2: 96% 94%  96%  Weight:   127.5 kg   Height:        Intake/Output Summary (Last 24 hours) at 10/15/2023 1316 Last data filed at 10/15/2023 1000 Gross per 24 hour  Intake 2700 ml  Output 1250 ml  Net 1450 ml   Filed Weights   10/14/23 0238 10/14/23 1523 10/15/23 0611  Weight: 115.7 kg 113.4 kg 127.5 kg     Physical examination:   Constitution:  Alert, cooperative, no distress,  Appears calm and comfortable  Psychiatric:   Normal and stable mood and affect, cognition intact,   HEENT:        Normocephalic, PERRL, otherwise with in Normal limits  Chest:         Chest symmetric Cardio vascular:  S1/S2, RRR, No murmure, No Rubs or Gallops  pulmonary: Clear to auscultation bilaterally, respirations unlabored, negative wheezes / crackles Abdomen: Soft, non-tender, non-distended, bowel sounds,no masses, no organomegaly Muscular skeletal: Limited exam - in bed, able to move all 4 extremities,   Neuro: CNII-XII intact. , normal motor and sensation, reflexes intact  Extremities: No pitting edema lower extremities, +2 pulses  Skin: Dry, warm to touch, negative for any Rashes, No open wounds Wounds: per nursing documentation   ------------------------------------------------------------------------------------------------------------------------------------------    LABs:     Latest Ref Rng & Units 10/15/2023    5:03 AM 10/14/2023     3:10 AM 01/03/2022    7:02 PM  CBC  WBC 4.0 - 10.5 K/uL 12.7  14.2  11.2   Hemoglobin 13.0 - 17.0 g/dL 72.5  36.6  44.0   Hematocrit 39.0 - 52.0 % 36.4  41.7  42.3   Platelets 150 -  400 K/uL 381  482  481       Latest Ref Rng & Units 10/15/2023    5:03 AM 10/14/2023   10:16 AM 10/14/2023    3:10 AM  CMP  Glucose 70 - 99 mg/dL 161  096  045   BUN 6 - 20 mg/dL 14  14  18    Creatinine 0.61 - 1.24 mg/dL 4.09  8.11  9.14   Sodium 135 - 145 mmol/L 135  132  130   Potassium 3.5 - 5.1 mmol/L 4.0  3.6  3.0   Chloride 98 - 111 mmol/L 101  101  101   CO2 22 - 32 mmol/L 26  19  17    Calcium  8.9 - 10.3 mg/dL 8.5  8.6  8.4   Total Protein 6.5 - 8.1 g/dL  7.2  7.0   Total Bilirubin 0.0 - 1.2 mg/dL  0.6  0.4   Alkaline Phos 38 - 126 U/L  45  54   AST 15 - 41 U/L  19  22   ALT 0 - 44 U/L  19  17        Micro Results Recent Results (from the past 240 hours)  Blood Culture (routine x 2)     Status: None (Preliminary result)   Collection Time: 10/14/23  3:06 AM   Specimen: BLOOD LEFT ARM  Result Value Ref Range Status   Specimen Description BLOOD LEFT ARM  Final   Special Requests   Final    BOTTLES DRAWN AEROBIC AND ANAEROBIC Blood Culture adequate volume   Culture  Setup Time   Final    GRAM NEGATIVE RODS ANAEROBIC BOTTLE ONLY Gram Stain Report Called to,Read Back By and Verified With: Eduardo Grade, RN AT 2059 10/14/23 BY Yaakov Heir Performed at [facility], [address], [place], [place] [address]    Culture GRAM NEGATIVE RODS  Final   Report Status PENDING  Incomplete  Blood Culture (routine x 2)     Status: None (Preliminary result)   Collection Time: 10/14/23  3:10 AM   Specimen: BLOOD LEFT HAND  Result Value Ref Range Status   Specimen Description   Final    BLOOD LEFT HAND Performed at [facility], [address] [place], [place] [address]    Special Requests   Final    BOTTLES DRAWN AEROBIC AND ANAEROBIC Blood Culture adequate volume Performed at [facility], [address], [place], [place] [address]    Culture  Setup Time   Final    GRAM NEGATIVE RODS AEROBIC BOTTLE ONLY Gram Stain Report Called to,Read Back By and Verified With: Eduardo Grade, RN AT 2059 10/14/23 BY A. SNYDER CRITICAL RESULT CALLED TO, READ BACK BY AND VERIFIED WITH: C GANOE,RN@0240  10/15/23 MK Performed at [facility], [address] [place], [place] [address]    Culture GRAM NEGATIVE RODS  Final   Report Status PENDING  Incomplete  Blood Culture ID Panel (Reflexed)     Status: Abnormal   Collection Time: 10/14/23  3:10 AM  Result Value Ref Range Status   Enterococcus faecalis NOT DETECTED NOT DETECTED Final   Enterococcus Faecium NOT DETECTED NOT DETECTED Final   Listeria monocytogenes NOT DETECTED NOT DETECTED Final   Staphylococcus species NOT DETECTED NOT DETECTED Final   Staphylococcus aureus (BCID) NOT DETECTED NOT DETECTED Final   Staphylococcus epidermidis NOT DETECTED NOT DETECTED Final   Staphylococcus lugdunensis NOT DETECTED NOT DETECTED Final   Streptococcus species NOT DETECTED NOT  DETECTED Final   Streptococcus agalactiae NOT DETECTED NOT DETECTED Final   Streptococcus pneumoniae NOT DETECTED NOT DETECTED Final   Streptococcus pyogenes NOT DETECTED NOT DETECTED Final   A.calcoaceticus-baumannii NOT DETECTED NOT DETECTED Final   Bacteroides fragilis NOT DETECTED NOT DETECTED Final   Enterobacterales DETECTED (A) NOT DETECTED Final    Comment: Enterobacterales represent a large order of gram negative bacteria, not a single organism. CRITICAL RESULT CALLED TO, READ BACK BY AND VERIFIED WITH: C GANOE,RN@0240  10/15/23 MK    Enterobacter cloacae complex NOT DETECTED NOT DETECTED Final   Escherichia coli NOT DETECTED NOT DETECTED Final   Klebsiella aerogenes NOT DETECTED NOT DETECTED Final   Klebsiella oxytoca NOT DETECTED NOT DETECTED Final   Klebsiella pneumoniae DETECTED (A) NOT DETECTED Final    Comment: CRITICAL RESULT CALLED TO, READ BACK BY AND VERIFIED  WITH: C GANOE,RN@0240  10/15/23 MK    Proteus species NOT DETECTED NOT DETECTED Final   Salmonella species NOT DETECTED NOT DETECTED Final   Serratia marcescens NOT DETECTED NOT DETECTED Final   Haemophilus influenzae NOT DETECTED NOT DETECTED Final   Neisseria meningitidis NOT DETECTED NOT DETECTED Final   Pseudomonas aeruginosa NOT DETECTED NOT DETECTED Final   Stenotrophomonas maltophilia NOT DETECTED NOT DETECTED Final   Candida albicans NOT DETECTED NOT DETECTED Final   Candida auris NOT DETECTED NOT DETECTED Final   Candida glabrata NOT DETECTED NOT DETECTED Final   Candida krusei NOT DETECTED NOT DETECTED Final   Candida parapsilosis NOT DETECTED NOT DETECTED Final   Candida tropicalis NOT DETECTED NOT DETECTED Final   Cryptococcus neoformans/gattii NOT DETECTED NOT DETECTED Final   CTX-M ESBL NOT DETECTED NOT DETECTED Final   Carbapenem resistance IMP NOT DETECTED NOT DETECTED Final   Carbapenem resistance KPC NOT DETECTED NOT DETECTED Final   Carbapenem resistance NDM NOT DETECTED NOT DETECTED Final   Carbapenem resist OXA 48 LIKE NOT DETECTED NOT DETECTED Final   Carbapenem resistance VIM NOT DETECTED NOT DETECTED Final    Comment: Performed at [facility], [address] [place], [place] [address]  Resp panel by RT-PCR (RSV, Flu A&B, Covid) Anterior Nasal Swab     Status: None   Collection Time: 10/14/23  3:27 AM   Specimen: Anterior Nasal Swab  Result Value Ref Range Status   SARS Coronavirus 2 by RT PCR NEGATIVE NEGATIVE Final    Comment: (NOTE) SARS-CoV-2 target nucleic acids are NOT DETECTED.  The SARS-CoV-2 RNA is generally detectable in upper respiratory specimens during the acute phase of infection. The lowest concentration of SARS-CoV-2 viral copies this assay can detect is 138 copies/mL. A negative result does not preclude SARS-Cov-2 infection and should not be used as the sole basis for treatment or other patient management decisions. A negative result  may occur with  improper specimen collection/handling, submission of specimen other than nasopharyngeal swab, presence of viral mutation(s) within the areas targeted by this assay, and inadequate number of viral copies(<138 copies/mL). A negative result must be combined with clinical observations, patient history, and epidemiological information. The expected result is Negative.  Fact Sheet for Patients:  BloggerCourse.com  Fact Sheet for Healthcare Providers:  SeriousBroker.it  This test is no t yet approved or cleared by the [place]  FDA and  has been authorized for detection and/or diagnosis of SARS-CoV-2 by FDA under an Emergency Use Authorization (EUA). This EUA will remain  in effect (meaning this test can be used) for the duration of the COVID-19 declaration under Section 564(b)(1) of  the Act, 21 U.S.C.section 360bbb-3(b)(1), unless the authorization is terminated  or revoked sooner.       Influenza A by PCR NEGATIVE NEGATIVE Final   Influenza B by PCR NEGATIVE NEGATIVE Final    Comment: (NOTE) The Xpert Xpress SARS-CoV-2/FLU/RSV plus assay is intended as an aid in the diagnosis of influenza from Nasopharyngeal swab specimens and should not be used as a sole basis for treatment. Nasal washings and aspirates are unacceptable for Xpert Xpress SARS-CoV-2/FLU/RSV testing.  Fact Sheet for Patients: BloggerCourse.com  Fact Sheet for Healthcare Providers: SeriousBroker.it  This test is not yet approved or cleared by the [place]  FDA and has been authorized for detection and/or diagnosis of SARS-CoV-2 by FDA under an Emergency Use Authorization (EUA). This EUA will remain in effect (meaning this test can be used) for the duration of the COVID-19 declaration under Section 564(b)(1) of the Act, 21 U.S.C. section 360bbb-3(b)(1), unless the authorization is terminated  or revoked.     Resp Syncytial Virus by PCR NEGATIVE NEGATIVE Final    Comment: (NOTE) Fact Sheet for Patients: BloggerCourse.com  Fact Sheet for Healthcare Providers: SeriousBroker.it  This test is not yet approved or cleared by the [place]  FDA and has been authorized for detection and/or diagnosis of SARS-CoV-2 by FDA under an Emergency Use Authorization (EUA). This EUA will remain in effect (meaning this test can be used) for the duration of the COVID-19 declaration under Section 564(b)(1) of the Act, 21 U.S.C. section 360bbb-3(b)(1), unless the authorization is terminated or revoked.  Performed at [facility], [address] [place], [place] [address]   Urine Culture     Status: None   Collection Time: 10/14/23  6:07 AM   Specimen: Urine, Random  Result Value Ref Range Status   Specimen Description   Final    URINE, RANDOM Performed at [facility], [address], [place], [place] [address]    Special Requests   Final    NONE Reflexed from [phone] Performed at [facility], [address], [place], [place] [address]    Culture   Final    NO GROWTH Performed at [facility], [address], [place], [place] [address]    Report Status 10/15/2023 FINAL  Final    Radiology Reports No results found.  SIGNED: Bobbetta Burnet, MD, FHM. FAAFP. Arlin Benes - [facility] Time spent - 55 min.  In seeing, evaluating and examining the patient. Reviewing medical records, labs, drawn plan of care. [facility],  Pager (please use [email or web address] to page/ text) Please use Epic Secure Chat for non-urgent communication (7AM-7PM)  If 7PM-7AM, please contact night-coverage [email or web address], 10/15/2023, 1:16 PM

## 2023-10-15 NOTE — Evaluation (Signed)
 Occupational Therapy Evaluation Patient Details Name: [name] [name] MRN: [id number] DOB: Feb 17, 1966 Today's Date: 10/15/2023   History of Present Illness   [name] [name] is a 58 year old male with history of HTN, DM 2, adrenal insufficiency, hypothyroidism, with recent treatment of dental infection presented with fatigue malaise and fever.  Currently patient was recently on antibiotics for dental infection. On arrival patient was found febrile tachypneic, tachycardic, with leukocytosis elevated lactic acids.  Met sepsis criteria therefore per sepsis protocol IV fluid resuscitation with broad-spectrum antibiotics and steroids was initiated. Patient will be admitted for sepsis. Source of infection possible UTI, ruling out dental cavity, abscess.     Clinical Impressions Pt agreeable to OT/PT co-evaluation. Pt performing ADLs and functional mobility independently during session. Has 24/7 assist if needed upon return home. Pt is near baseline, no further OT services required at this time.      If plan is discharge home, recommend the following:   Assistance with cooking/housework     Functional Status Assessment   Patient has had a recent decline in their functional status and demonstrates the ability to make significant improvements in function in a reasonable and predictable amount of time.     Equipment Recommendations   None recommended by OT      Precautions/Restrictions   Precautions Precautions: None Recall of Precautions/Restrictions: Intact Restrictions Weight Bearing Restrictions Per Provider Order: No      ADL either performed or assessed with clinical judgement   ADL Overall ADL's : Modified independent;At baseline                                             Vision Baseline Vision/History: 0 No visual deficits Vision Assessment?: No apparent visual deficits     Perception Perception: Within Functional Limits       Praxis Praxis:  WFL       Pertinent Vitals/Pain Pain Assessment Pain Assessment: No/denies pain     Extremity/Trunk Assessment Upper Extremity Assessment Upper Extremity Assessment: Overall WFL for tasks assessed   Lower Extremity Assessment Lower Extremity Assessment: Defer to PT evaluation   Cervical / Trunk Assessment Cervical / Trunk Assessment: Normal   Communication Communication Communication: No apparent difficulties   Cognition Arousal: Alert Behavior During Therapy: WFL for tasks assessed/performed                                 Following commands: Intact                  Home Living Family/patient expects to be discharged to:: Private residence Living Arrangements: Spouse/significant other Available Help at Discharge: Available 24 hours/day;Family Type of Home: House Home Access: Stairs to enter Secretary/administrator of Steps: 6 Entrance Stairs-Rails: Can reach both;Left;Right Home Layout: One level     Bathroom Shower/Tub: Producer, television/film/video: Standard Bathroom Accessibility: Yes   Home Equipment: Agricultural consultant (2 wheels);Rollator (4 wheels);Cane - quad;Grab bars - tub/shower          Prior Functioning/Environment Prior Level of Function : Independent/Modified Independent             Mobility Comments: Community ambulator, no AD use. Hasnt worked or driven since October ADLs Comments: (I) with all ADLs and iADLs    OT Problem List: Decreased activity tolerance  Co-evaluation PT/OT/SLP Co-Evaluation/Treatment: Yes Reason for Co-Treatment: To address functional/ADL transfers PT goals addressed during session: Mobility/safety with mobility OT goals addressed during session: ADL's and self-care      AM-PAC OT "6 Clicks" Daily Activity     Outcome Measure Help from another person eating meals?: None Help from another person taking care of personal grooming?: None Help from another person toileting,  which includes using toliet, bedpan, or urinal?: None Help from another person bathing (including washing, rinsing, drying)?: None Help from another person to put on and taking off regular upper body clothing?: None Help from another person to put on and taking off regular lower body clothing?: None 6 Click Score: 24   End of Session    Activity Tolerance: Patient tolerated treatment well Patient left: in chair;with call bell/phone within reach;with chair alarm set  OT Visit Diagnosis: Muscle weakness (generalized) (M62.81)                Time: 9629-5284 OT Time Calculation (min): 23 min Charges:  OT General Charges $OT Visit: 1 Visit OT Evaluation $OT Eval Low Complexity: 1 Low  Lafonda Piety, OTR/L  [phone] 10/15/2023, 10:10 AM

## 2023-10-15 NOTE — TOC Progression Note (Addendum)
 Transition of Care [facility]) - Progression Note    Patient Details  Name: KENNIE GARINO MRN: [id number] Date of Birth: [birth year]  Transition of Care [facility]) CM/SW Contact  Ander [place], [place] Phone Number: 10/15/2023, 8:18 AM  Clinical Narrative: Pt confirms no insurance. Agreeable to refer to financial counselor to screen for Medicaid. Referral sent to [facility].  TOC received consult for PCP/HH/DME. Pt reports he has PCP and has a cane at home with no other DME needs. PT/OT evaluated pt this morning and no follow up needed.       Barriers to Discharge: Continued Medical Work up  Expected Discharge Plan and Services                                               Social Determinants of Health (SDOH) Interventions SDOH Screenings   Food Insecurity: No Food Insecurity (10/14/2023)  Housing: Low Risk  (10/14/2023)  Transportation Needs: No Transportation Needs (10/14/2023)  Utilities: Not At Risk (10/14/2023)  Social Connections: Unknown (10/16/2021)   Received from [facility]  Tobacco Use: Medium Risk (10/14/2023)    Readmission Risk Interventions    10/14/2023    7:56 AM  Readmission Risk Prevention Plan  Medication Screening Complete  Transportation Screening Complete

## 2023-10-15 NOTE — Plan of Care (Signed)
  Problem: Fluid Volume: Goal: Hemodynamic stability will improve Outcome: Progressing   Problem: Education: Goal: Ability to describe self-care measures that may prevent or decrease complications (Diabetes Survival Skills Education) will improve Outcome: Progressing   Problem: Coping: Goal: Ability to adjust to condition or change in health will improve Outcome: Progressing   Problem: Fluid Volume: Goal: Ability to maintain a balanced intake and output will improve Outcome: Progressing   Problem: Skin Integrity: Goal: Risk for impaired skin integrity will decrease Outcome: Progressing   Problem: Tissue Perfusion: Goal: Adequacy of tissue perfusion will improve Outcome: Progressing   Problem: Education: Goal: Knowledge of General Education information will improve Description: Including pain rating scale, medication(s)/side effects and non-pharmacologic comfort measures Outcome: Progressing

## 2023-10-16 DIAGNOSIS — I10 Essential (primary) hypertension: Secondary | ICD-10-CM

## 2023-10-16 DIAGNOSIS — G4733 Obstructive sleep apnea (adult) (pediatric): Secondary | ICD-10-CM

## 2023-10-16 DIAGNOSIS — E1165 Type 2 diabetes mellitus with hyperglycemia: Secondary | ICD-10-CM

## 2023-10-16 DIAGNOSIS — E66813 Obesity, class 3: Secondary | ICD-10-CM

## 2023-10-16 LAB — BASIC METABOLIC PANEL WITH GFR
Anion gap: 10 (ref 5–15)
BUN: 14 mg/dL (ref 6–20)
CO2: 23 mmol/L (ref 22–32)
Calcium: 8.2 mg/dL — ABNORMAL LOW (ref 8.9–10.3)
Chloride: 101 mmol/L (ref 98–111)
Creatinine, Ser: 0.81 mg/dL (ref 0.61–1.24)
GFR, Estimated: >60 mL/min (ref >60)
Glucose, Bld: 259 mg/dL — ABNORMAL HIGH (ref 70–99)
Potassium: 3.2 mmol/L — ABNORMAL LOW (ref 3.5–5.1)
Sodium: 134 mmol/L — ABNORMAL LOW (ref 135–145)

## 2023-10-16 LAB — CBC
HCT: 34.5 % — ABNORMAL LOW (ref 39.0–52.0)
Hemoglobin: 11.9 g/dL — ABNORMAL LOW (ref 13.0–17.0)
MCH: 26.4 pg (ref 26.0–34.0)
MCHC: 34.5 g/dL (ref 30.0–36.0)
MCV: 76.7 fL — ABNORMAL LOW (ref 80.0–100.0)
Platelets: 399 10*3/uL (ref 150–400)
RBC: 4.5 MIL/uL (ref 4.22–5.81)
RDW: 16.9 % — ABNORMAL HIGH (ref 11.5–15.5)
WBC: 9.6 10*3/uL (ref 4.0–10.5)
nRBC: 0 % (ref 0.0–0.2)

## 2023-10-16 LAB — GLUCOSE, CAPILLARY
Glucose-Capillary: 268 mg/dL — ABNORMAL HIGH (ref 70–99)
Glucose-Capillary: 301 mg/dL — ABNORMAL HIGH (ref 70–99)
Glucose-Capillary: 359 mg/dL — ABNORMAL HIGH (ref 70–99)
Glucose-Capillary: 352 mg/dL — ABNORMAL HIGH (ref 70–99)

## 2023-10-16 MED ORDER — SODIUM CHLORIDE 0.9 % IV SOLN
2.0000 g | INTRAVENOUS | Status: DC
  Administered 2023-10-16 – 2023-10-17 (×2): 2 g via INTRAVENOUS
  Filled 2023-10-16 (×2): qty 20

## 2023-10-16 MED ORDER — INSULIN STARTER KIT- PEN NEEDLES (ENGLISH)
1.0000 | Freq: Once | Status: AC
  Administered 2023-10-16: 1
  Filled 2023-10-16: qty 1

## 2023-10-16 MED ORDER — HYDROCORTISONE 20 MG PO TABS
20.0000 mg | ORAL_TABLET | Freq: Two times a day (BID) | ORAL | Status: DC
  Administered 2023-10-16 – 2023-10-17 (×2): 20 mg via ORAL
  Filled 2023-10-16 (×4): qty 1

## 2023-10-16 MED ORDER — LIVING WELL WITH DIABETES BOOK
Freq: Once | Status: AC
Start: 2023-10-16 — End: 2023-10-16

## 2023-10-16 NOTE — Progress Notes (Signed)
 Taught patient how to use insulin  pen. Patient did well, feels comfortable using.

## 2023-10-16 NOTE — Plan of Care (Signed)
   Problem: Fluid Volume: Goal: Hemodynamic stability will improve Outcome: Progressing   Problem: Clinical Measurements: Goal: Diagnostic test results will improve Outcome: Progressing Goal: Signs and symptoms of infection will decrease Outcome: Progressing   Problem: Respiratory: Goal: Ability to maintain adequate ventilation will improve Outcome: Progressing

## 2023-10-16 NOTE — Progress Notes (Addendum)
 PROGRESS NOTE    Patient: [name] [name]                            PCP: Minus Amel, MD                    DOB: [birth year]            DOA: 10/14/2023 ONG:[id number]             DOS: 10/16/2023, 5:59 PM   LOS: 2 days   Date of Service: The patient was seen and examined on 10/16/2023  Subjective:   -No further fevers  No Nausea, Vomiting or Diarrhea No fever  Or chills   Brief Narrative:   [name] [name] is a 58 year old male with history of HTN, DM 2, adrenal insufficiency, hypothyroidism, with recent treatment of dental infection presented with fatigue malaise and fever. Currently patient was recently on antibiotics for dental infection  ED evaluation: Upon arrival Tmax 101.4, pulse as high as 130, RR 30, 98% on CPAP Blood pressure 115/72, pulse 98, temperature 98.7 F (37.1 C), temperature source Oral, resp. rate 19, height 5\' 7"  (1.702 m), weight 115.7 kg, SpO2 98%.  WBC 14.2, sodium 130, potassium 3.0, calcium  25, 34, lactic acid 2.8, 2.5,  On arrival patient was found febrile tachypneic, tachycardic, with leukocytosis elevated lactic acids.  Met sepsis criteria therefore per sepsis protocol IV fluid resuscitation with broad-spectrum antibiotics and steroids was initiated  Patient will be admitted for sepsis.  Source of infection possible UTI, ruling out dental cavity, abscess.  Obtaining CT maxillary   Assessment & Plan:   Principal Problem:   Sepsis (HCC) Active Problems:   Sepsis due to urinary tract infection (HCC)   Uncontrolled type 2 diabetes mellitus with hyperglycemia (HCC)   Essential hypertension   Status post splenectomy   OSA on CPAP   RBBB   GERD   Irritable bowel syndrome   Depression   Hypokalemia   Hypothyroidism   Spinal stenosis of lumbar region   Urinary tract infection   Obesity, Class III, BMI 40-49.9 (morbid obesity)   Pure hypercholesterolemia   Assessment and Plan: 1)Klebsiella Sepsis --POA blood cultures from 10/14/2023 with  Klebsiella sensitivities are pending -Repeat blood cultures from 10/16/2023 NGTD - Urine culture NGTD - Fever curve trending down -Patient is status post prior splenectomy in 2003 due to hypersplenism with thrombocytopenia at that time,--status post splenectomy puts patient at risk for encapsulated organism infection due to lack of Opsonins--Klebsiella is a member of that encapsulated organism group -CT facial maxillary was obtain and reviewed - ruled out abscess - CT abdomen and pelvis without acute findings - Patient received cefepime, Flagyl, vancomycin, -Okay to de-escalate to Rocephin  monotherapy on 10/16/2023 pending sensitivities  2)DM2-A1c 12.3 reflecting uncontrolled DM with hyperglycemia PTA - Holding home medication of Amaryl and metformin, -Continue linagliptin - Give Semglee  25 units Use Novolog /Humalog Sliding scale insulin  with Accu-Cheks/Fingersticks as ordered   3)HTN - Norvasc  restarted, continue to hold losartan   4)Morbid Obesity/OSA -Low calorie diet, portion control and increase physical activity discussed with patient -Body mass index is 44.27 kg/m. --- Continue CPAP nightly  5)HLD--- Continue home dose statin  6)Hypothyroidism - Continue home dose Synthroid   7)Depression -stable continue Cymbalta   8)Irritable bowel syndrome -Currently stable, monitoring  9)GERD -Continue PPI  10)BPH-continue Flomax   11)Chronic adrenal insufficiency-continue home medication of hydrocortisone   ------------------------------------------------------------------------------------------------------------ Nutritional status:  The patient's BMI is: Body mass  index is 44.27 kg/m. I agree with the assessment and plan as outlined   Cultures; Blood Cultures x 2 >> from 10/14/23 with Klebsiella pneumonia Repeat blood culture   obtained 10/16/2023>>  Urine Culture  >>> no growth to  date  ------------------------------------------------------------------------------------------------------------------------------------------------  DVT prophylaxis:  heparin  injection 5,000 Units Start: 10/14/23 1000 TED hose Start: 10/14/23 0750 SCDs Start: 10/14/23 0750   Code Status:   Code Status: Full Code  Family Communication: No family member present at bedside-   Admission status:   Status is: Inpatient  Disposition: From  - home             Planning for discharge in am--  Procedures:   Antimicrobials:  Anti-infectives (From admission, onward)    Start     Dose/Rate Route Frequency Ordered Stop   10/16/23 1400  cefTRIAXone  (ROCEPHIN ) 2 g in sodium chloride  0.9 % 100 mL IVPB        2 g 200 mL/hr over 30 Minutes Intravenous Every 24 hours 10/16/23 1152     10/14/23 1600  metroNIDAZOLE (FLAGYL) IVPB 500 mg  Status:  Discontinued        500 mg 100 mL/hr over 60 Minutes Intravenous Every 12 hours 10/14/23 0752 10/14/23 2102   10/14/23 1600  vancomycin (VANCOCIN) IVPB 1000 mg/200 mL premix  Status:  Discontinued        1,000 mg 200 mL/hr over 60 Minutes Intravenous Every 12 hours 10/14/23 0834 10/14/23 2102   10/14/23 1200  ceFEPIme (MAXIPIME) 2 g in sodium chloride  0.9 % 100 mL IVPB  Status:  Discontinued        2 g 200 mL/hr over 30 Minutes Intravenous Every 8 hours 10/14/23 0834 10/16/23 1152   10/14/23 0800  ceFEPIme (MAXIPIME) 2 g in sodium chloride  0.9 % 100 mL IVPB  Status:  Discontinued        2 g 200 mL/hr over 30 Minutes Intravenous  Once 10/14/23 0752 10/14/23 0812   10/14/23 0800  vancomycin (VANCOCIN) IVPB 1000 mg/200 mL premix  Status:  Discontinued        1,000 mg 200 mL/hr over 60 Minutes Intravenous  Once 10/14/23 0752 10/14/23 0759   10/14/23 0400  vancomycin (VANCOCIN) IVPB 1000 mg/200 mL premix       Placed in "And" Linked Group   1,000 mg 200 mL/hr over 60 Minutes Intravenous  Once 10/14/23 0255 10/14/23 0531   10/14/23 0300  ceFEPIme  (MAXIPIME) 2 g in sodium chloride  0.9 % 100 mL IVPB        2 g 200 mL/hr over 30 Minutes Intravenous  Once 10/14/23 0247 10/14/23 0349   10/14/23 0300  metroNIDAZOLE (FLAGYL) IVPB 500 mg        500 mg 100 mL/hr over 60 Minutes Intravenous  Once 10/14/23 0247 10/14/23 0450   10/14/23 0300  vancomycin (VANCOCIN) IVPB 1000 mg/200 mL premix  Status:  Discontinued        1,000 mg 200 mL/hr over 60 Minutes Intravenous  Once 10/14/23 0247 10/14/23 0253   10/14/23 0300  vancomycin (VANCOCIN) IVPB 1000 mg/200 mL premix       Placed in "And" Linked Group   1,000 mg 200 mL/hr over 60 Minutes Intravenous  Once 10/14/23 0255 10/14/23 0446      Medication:   DULoxetine   120 mg Oral QHS   heparin   5,000 Units Subcutaneous Q8H   hydrocortisone   20 mg Oral BID   insulin  aspart  0-20 Units Subcutaneous TID WC  insulin  glargine-yfgn  25 Units Subcutaneous Daily   levothyroxine   150 mcg Oral QAC breakfast   linagliptin  5 mg Oral Daily   multivitamin with minerals  1 tablet Oral Daily   pantoprazole   40 mg Oral Daily   rosuvastatin   20 mg Oral Daily   sodium chloride  flush  3 mL Intravenous Q12H   sodium chloride  flush  3 mL Intravenous Q12H   tamsulosin   0.4 mg Oral q AM    acetaminophen , bisacodyl, hydrALAZINE, ipratropium, levalbuterol, ondansetron  **OR** ondansetron  (ZOFRAN ) IV, oxyCODONE , senna-docusate, sodium phosphate, zolpidem    Objective:   Vitals:   10/16/23 0544 10/16/23 0602 10/16/23 1026 10/16/23 1241  BP:  138/66  129/81  Pulse:  66  77  Resp:  20  18  Temp:  98.1 F (36.7 C) 99 F (37.2 C) 98.5 F (36.9 C)  TempSrc:  Oral Oral   SpO2:  96%  92%  Weight: 128.2 kg     Height:        Intake/Output Summary (Last 24 hours) at 10/16/2023 1759 Last data filed at 10/16/2023 1701 Gross per 24 hour  Intake 1170.11 ml  Output 3700 ml  Net -2529.89 ml   Filed Weights   10/14/23 1523 10/15/23 0611 10/16/23 0544  Weight: 113.4 kg 127.5 kg 128.2 kg    Physical examination:     Physical Exam Gen:- Awake Alert, in no acute distress  HEENT:- [place].AT, No sclera icterus Neck-Supple Neck,No JVD,.  Lungs-  CTAB , fair air movement bilaterally  CV- S1, S2 normal, RRR Abd-  +ve B.Sounds, Abd Soft, No tenderness, increased truncal adiposity noted, no CVA area tenderness Extremity/Skin:- No  edema,   good pedal pulses  Psych-affect is appropriate, oriented x3 Neuro-no new focal deficits, no tremors  LABs:     Latest Ref Rng & Units 10/16/2023   12:28 AM 10/15/2023    5:03 AM 10/14/2023    3:10 AM  CBC  WBC 4.0 - 10.5 K/uL 9.6  12.7  14.2   Hemoglobin 13.0 - 17.0 g/dL 16.1  09.6  04.5   Hematocrit 39.0 - 52.0 % 34.5  36.4  41.7   Platelets 150 - 400 K/uL 399  381  482       Latest Ref Rng & Units 10/16/2023   12:28 AM 10/15/2023    5:03 AM 10/14/2023   10:16 AM  CMP  Glucose 70 - 99 mg/dL 409  811  914   BUN 6 - 20 mg/dL 14  14  14    Creatinine 0.61 - 1.24 mg/dL 7.82  9.56  2.13   Sodium 135 - 145 mmol/L 134  135  132   Potassium 3.5 - 5.1 mmol/L 3.2  4.0  3.6   Chloride 98 - 111 mmol/L 101  101  101   CO2 22 - 32 mmol/L 23  26  19    Calcium  8.9 - 10.3 mg/dL 8.2  8.5  8.6   Total Protein 6.5 - 8.1 g/dL   7.2   Total Bilirubin 0.0 - 1.2 mg/dL   0.6   Alkaline Phos 38 - 126 U/L   45   AST 15 - 41 U/L   19   ALT 0 - 44 U/L   19    Micro Results Recent Results (from the past 240 hours)  Blood Culture (routine x 2)     Status: Abnormal (Preliminary result)   Collection Time: 10/14/23  3:06 AM   Specimen: BLOOD LEFT ARM  Result Value  Ref Range Status   Specimen Description   Final    BLOOD LEFT ARM Performed at [facility], [address], [place], [place] [address]    Special Requests   Final    BOTTLES DRAWN AEROBIC AND ANAEROBIC Blood Culture adequate volume Performed at [facility], [address] [place], [place] [address]    Culture  Setup Time   Final    GRAM NEGATIVE RODS IN BOTH AEROBIC AND ANAEROBIC BOTTLES Gram Stain Report Called  to,Read Back By and Verified With: Eduardo Grade, RN AT 2059 10/14/23 BY A. SNYDER CRITICAL VALUE NOTED.  VALUE IS CONSISTENT WITH PREVIOUSLY REPORTED AND CALLED VALUE. Performed at [facility], [address], [place], [place] [address]    Culture KLEBSIELLA PNEUMONIAE (A)  Final   Report Status PENDING  Incomplete  Blood Culture (routine x 2)     Status: Abnormal (Preliminary result)   Collection Time: 10/14/23  3:10 AM   Specimen: BLOOD LEFT HAND  Result Value Ref Range Status   Specimen Description   Final    BLOOD LEFT HAND Performed at [facility], [address], [place], [place] [address]    Special Requests   Final    BOTTLES DRAWN AEROBIC AND ANAEROBIC Blood Culture adequate volume Performed at [facility], [address] [place], [place] [address]    Culture  Setup Time   Final    GRAM NEGATIVE RODS AEROBIC BOTTLE ONLY Gram Stain Report Called to,Read Back By and Verified With: Eduardo Grade, RN AT 2059 10/14/23 BY A. SNYDER CRITICAL RESULT CALLED TO, READ BACK BY AND VERIFIED WITH: C GANOE,RN@0240  10/15/23 MK    Culture (A)  Final    KLEBSIELLA PNEUMONIAE SUSCEPTIBILITIES TO FOLLOW Performed at [facility], [address] [place], [place] [address]    Report Status PENDING  Incomplete  Blood Culture ID Panel (Reflexed)     Status: Abnormal   Collection Time: 10/14/23  3:10 AM  Result Value Ref Range Status   Enterococcus faecalis NOT DETECTED NOT DETECTED Final   Enterococcus Faecium NOT DETECTED NOT DETECTED Final   Listeria monocytogenes NOT DETECTED NOT DETECTED Final   Staphylococcus species NOT DETECTED NOT DETECTED Final   Staphylococcus aureus (BCID) NOT DETECTED NOT DETECTED Final   Staphylococcus epidermidis NOT DETECTED NOT DETECTED Final   Staphylococcus lugdunensis NOT DETECTED NOT DETECTED Final   Streptococcus species NOT DETECTED NOT DETECTED Final   Streptococcus agalactiae NOT DETECTED NOT DETECTED Final   Streptococcus pneumoniae  NOT DETECTED NOT DETECTED Final   Streptococcus pyogenes NOT DETECTED NOT DETECTED Final   A.calcoaceticus-baumannii NOT DETECTED NOT DETECTED Final   Bacteroides fragilis NOT DETECTED NOT DETECTED Final   Enterobacterales DETECTED (A) NOT DETECTED Final    Comment: Enterobacterales represent a large order of gram negative bacteria, not a single organism. CRITICAL RESULT CALLED TO, READ BACK BY AND VERIFIED WITH: C GANOE,RN@0240  10/15/23 MK    Enterobacter cloacae complex NOT DETECTED NOT DETECTED Final   Escherichia coli NOT DETECTED NOT DETECTED Final   Klebsiella aerogenes NOT DETECTED NOT DETECTED Final   Klebsiella oxytoca NOT DETECTED NOT DETECTED Final   Klebsiella pneumoniae DETECTED (A) NOT DETECTED Final    Comment: CRITICAL RESULT CALLED TO, READ BACK BY AND VERIFIED WITH: C GANOE,RN@0240  10/15/23 MK    Proteus species NOT DETECTED NOT DETECTED Final   Salmonella species NOT DETECTED NOT DETECTED Final   Serratia marcescens NOT DETECTED NOT DETECTED Final   Haemophilus influenzae NOT  DETECTED NOT DETECTED Final   Neisseria meningitidis NOT DETECTED NOT DETECTED Final   Pseudomonas aeruginosa NOT DETECTED NOT DETECTED Final   Stenotrophomonas maltophilia NOT DETECTED NOT DETECTED Final   Candida albicans NOT DETECTED NOT DETECTED Final   Candida auris NOT DETECTED NOT DETECTED Final   Candida glabrata NOT DETECTED NOT DETECTED Final   Candida krusei NOT DETECTED NOT DETECTED Final   Candida parapsilosis NOT DETECTED NOT DETECTED Final   Candida tropicalis NOT DETECTED NOT DETECTED Final   Cryptococcus neoformans/gattii NOT DETECTED NOT DETECTED Final   CTX-M ESBL NOT DETECTED NOT DETECTED Final   Carbapenem resistance IMP NOT DETECTED NOT DETECTED Final   Carbapenem resistance KPC NOT DETECTED NOT DETECTED Final   Carbapenem resistance NDM NOT DETECTED NOT DETECTED Final   Carbapenem resist OXA 48 LIKE NOT DETECTED NOT DETECTED Final   Carbapenem resistance VIM NOT  DETECTED NOT DETECTED Final    Comment: Performed at [facility], [address], [place], [place] [address]  Resp panel by RT-PCR (RSV, Flu A&B, Covid) Anterior Nasal Swab     Status: None   Collection Time: 10/14/23  3:27 AM   Specimen: Anterior Nasal Swab  Result Value Ref Range Status   SARS Coronavirus 2 by RT PCR NEGATIVE NEGATIVE Final    Comment: (NOTE) SARS-CoV-2 target nucleic acids are NOT DETECTED.  The SARS-CoV-2 RNA is generally detectable in upper respiratory specimens during the acute phase of infection. The lowest concentration of SARS-CoV-2 viral copies this assay can detect is 138 copies/mL. A negative result does not preclude SARS-Cov-2 infection and should not be used as the sole basis for treatment or other patient management decisions. A negative result may occur with  improper specimen collection/handling, submission of specimen other than nasopharyngeal swab, presence of viral mutation(s) within the areas targeted by this assay, and inadequate number of viral copies(<138 copies/mL). A negative result must be combined with clinical observations, patient history, and epidemiological information. The expected result is Negative.  Fact Sheet for Patients:  BloggerCourse.com  Fact Sheet for Healthcare Providers:  SeriousBroker.it  This test is no t yet approved or cleared by the [place]  FDA and  has been authorized for detection and/or diagnosis of SARS-CoV-2 by FDA under an Emergency Use Authorization (EUA). This EUA will remain  in effect (meaning this test can be used) for the duration of the COVID-19 declaration under Section 564(b)(1) of the Act, 21 U.S.C.section 360bbb-3(b)(1), unless the authorization is terminated  or revoked sooner.       Influenza A by PCR NEGATIVE NEGATIVE Final   Influenza B by PCR NEGATIVE NEGATIVE Final    Comment: (NOTE) The Xpert Xpress SARS-CoV-2/FLU/RSV plus  assay is intended as an aid in the diagnosis of influenza from Nasopharyngeal swab specimens and should not be used as a sole basis for treatment. Nasal washings and aspirates are unacceptable for Xpert Xpress SARS-CoV-2/FLU/RSV testing.  Fact Sheet for Patients: BloggerCourse.com  Fact Sheet for Healthcare Providers: SeriousBroker.it  This test is not yet approved or cleared by the [place]  FDA and has been authorized for detection and/or diagnosis of SARS-CoV-2 by FDA under an Emergency Use Authorization (EUA). This EUA will remain in effect (meaning this test can be used) for the duration of the COVID-19 declaration under Section 564(b)(1) of the Act, 21 U.S.C. section 360bbb-3(b)(1), unless the authorization is terminated or revoked.     Resp Syncytial Virus by PCR NEGATIVE NEGATIVE Final    Comment: (NOTE) Fact Sheet  for Patients: BloggerCourse.com  Fact Sheet for Healthcare Providers: SeriousBroker.it  This test is not yet approved or cleared by the [place]  FDA and has been authorized for detection and/or diagnosis of SARS-CoV-2 by FDA under an Emergency Use Authorization (EUA). This EUA will remain in effect (meaning this test can be used) for the duration of the COVID-19 declaration under Section 564(b)(1) of the Act, 21 U.S.C. section 360bbb-3(b)(1), unless the authorization is terminated or revoked.  Performed at [facility], [address] [place], [place] [address]   Urine Culture     Status: None   Collection Time: 10/14/23  6:07 AM   Specimen: Urine, Random  Result Value Ref Range Status   Specimen Description   Final    URINE, RANDOM Performed at [facility], [address] [place], [place] [address]    Special Requests   Final    NONE Reflexed from [id number] Performed at [facility], [address], [place], [place] [address]    Culture   Final     NO GROWTH Performed at [facility], [address] [place], [place] [address]    Report Status 10/15/2023 FINAL  Final  Culture, blood (Routine X 2) w Reflex to ID Panel     Status: None (Preliminary result)   Collection Time: 10/16/23 12:25 AM   Specimen: BLOOD  Result Value Ref Range Status   Specimen Description BLOOD BLOOD RIGHT ARM  Final   Special Requests   Final    BOTTLES DRAWN AEROBIC AND ANAEROBIC Blood Culture adequate volume   Culture   Final    NO GROWTH < 12 HOURS Performed at [facility], [address], [place], [place] [address]    Report Status PENDING  Incomplete  Culture, blood (Routine X 2) w Reflex to ID Panel     Status: None (Preliminary result)   Collection Time: 10/16/23 12:27 AM   Specimen: BLOOD  Result Value Ref Range Status   Specimen Description BLOOD BLOOD RIGHT ARM  Final   Special Requests   Final    BOTTLES DRAWN AEROBIC AND ANAEROBIC Blood Culture adequate volume   Culture   Final    NO GROWTH < 12 HOURS Performed at [facility], [address], [place], [place] [address]    Report Status PENDING  Incomplete   SIGNED: Colin Dawley, MD,  Arlin Benes - [facility] hospitalist  If 7PM-7AM, please contact night-coverage [email or web address], 10/16/2023, 5:59 PM

## 2023-10-16 NOTE — Progress Notes (Signed)
 Mobility Specialist Progress Note:    10/16/23 1400  Mobility  Activity Refused mobility   Pt kindly refused mobility, has already been ambulating in the hallway today. All needs met.   Glinda Lapping Mobility Specialist Please contact via Special educational needs teacher or  Rehab office at [phone]

## 2023-10-16 NOTE — Inpatient Diabetes Management (Addendum)
 Inpatient Diabetes Program Recommendations  AACE/ADA: New Consensus Statement on Inpatient Glycemic Control (2015)  Target Ranges:  Prepandial:   less than 140 mg/dL      Peak postprandial:   less than 180 mg/dL (1-2 hours)      Critically ill patients:  140 - 180 mg/dL   Lab Results  Component Value Date   GLUCAP 268 (H) 10/16/2023   HGBA1C 12.3 (H) 10/14/2023    Latest Reference Range & Units 10/15/23 07:49 10/15/23 11:38 10/15/23 16:47 10/15/23 21:36 10/16/23 07:20  Glucose-Capillary 70 - 99 mg/dL 324 (H) 401 (H) 027 (H) 277 (H) 268 (H)  (H): Data is abnormally high  Diabetes history: DM2 Outpatient Diabetes medications: Amaryl 4 mg bid, Met 1 gm bid  Current orders for Inpatient glycemic control:  Semglee  25 units daily, Tradjenta 5 mg daily, Novolog  0-20 units tid  correction, Cortef  20 mg daily  Inpatient Diabetes Program Recommendations:   Patient was able to get Ozempic ordered through [facility] pharmacy and ready to pick up and start. A1c 12.3 (average blood glucose 306 over the past 2-3 months). Patient states his A1c @ his doctor's office was 9.7 in April but glucose has increased since has infected tooth. Ordered Living Well With Diabetes and insulin  pen starter kit. Nurses, please start teaching insulin  administration to patient to prepare if patient goes home on insulin . Please allow patient to give insulin  injections.  Patient states he is hoping for Medicaid approval since he has not worked a job since October 2024. Patient states he would be able to afford $35 per month insulin  from [employer] if that is the best option. Would be able to do 75/25 bid ac meals. Patient has glucose meter and strips and willing to check his CBGs more often.   Thank you, [name] [name] E. Shameka Aggarwal, RN, MSN, CDCES  Diabetes Coordinator Inpatient Glycemic Control Team Team Pager [phone] (8am-5pm) 10/16/2023 9:57 AM

## 2023-10-17 ENCOUNTER — Inpatient Hospital Stay (HOSPITAL_COMMUNITY): Payer: MEDICAID

## 2023-10-17 DIAGNOSIS — Z9081 Acquired absence of spleen: Secondary | ICD-10-CM

## 2023-10-17 DIAGNOSIS — E039 Hypothyroidism, unspecified: Secondary | ICD-10-CM

## 2023-10-17 DIAGNOSIS — N390 Urinary tract infection, site not specified: Secondary | ICD-10-CM

## 2023-10-17 LAB — CBC
HCT: 35.2 % — ABNORMAL LOW (ref 39.0–52.0)
Hemoglobin: 11.4 g/dL — ABNORMAL LOW (ref 13.0–17.0)
MCH: 25.3 pg — ABNORMAL LOW (ref 26.0–34.0)
MCHC: 32.4 g/dL (ref 30.0–36.0)
MCV: 78 fL — ABNORMAL LOW (ref 80.0–100.0)
Platelets: 451 10*3/uL — ABNORMAL HIGH (ref 150–400)
RBC: 4.51 MIL/uL (ref 4.22–5.81)
RDW: 16.6 % — ABNORMAL HIGH (ref 11.5–15.5)
WBC: 8.6 10*3/uL (ref 4.0–10.5)
nRBC: 0 % (ref 0.0–0.2)

## 2023-10-17 LAB — GLUCOSE, CAPILLARY
Glucose-Capillary: 279 mg/dL — ABNORMAL HIGH (ref 70–99)
Glucose-Capillary: 295 mg/dL — ABNORMAL HIGH (ref 70–99)
Glucose-Capillary: 282 mg/dL — ABNORMAL HIGH (ref 70–99)

## 2023-10-17 LAB — CULTURE, BLOOD (ROUTINE X 2)
Special Requests: ADEQUATE
Special Requests: ADEQUATE

## 2023-10-17 LAB — BASIC METABOLIC PANEL WITH GFR
Anion gap: 11 (ref 5–15)
BUN: 9 mg/dL (ref 6–20)
CO2: 21 mmol/L — ABNORMAL LOW (ref 22–32)
Calcium: 8.3 mg/dL — ABNORMAL LOW (ref 8.9–10.3)
Chloride: 104 mmol/L (ref 98–111)
Creatinine, Ser: 0.69 mg/dL (ref 0.61–1.24)
GFR, Estimated: >60 mL/min (ref >60)
Glucose, Bld: 329 mg/dL — ABNORMAL HIGH (ref 70–99)
Potassium: 3.5 mmol/L (ref 3.5–5.1)
Sodium: 136 mmol/L (ref 135–145)

## 2023-10-17 MED ORDER — SULFAMETHOXAZOLE-TRIMETHOPRIM 800-160 MG PO TABS: 1.0000 | ORAL_TABLET | Freq: Two times a day (BID) | ORAL | 0 refills | Status: AC

## 2023-10-17 MED ORDER — INSULIN GLARGINE 100 UNIT/ML SOLOSTAR PEN: 20.0000 [IU] | PEN_INJECTOR | Freq: Every day | SUBCUTANEOUS | 1 refills | Status: DC

## 2023-10-17 MED ORDER — METFORMIN HCL 1000 MG PO TABS: 1000.0000 mg | ORAL_TABLET | Freq: Two times a day (BID) | ORAL | 5 refills | Status: DC

## 2023-10-17 MED ORDER — SODIUM CHLORIDE 0.9 % IV SOLN: 1.0000 g | Freq: Once | INTRAVENOUS | Status: DC

## 2023-10-17 MED ORDER — SITAGLIPTIN PHOSPHATE 100 MG PO TABS
100.0000 mg | ORAL_TABLET | Freq: Every day | ORAL | 5 refills | Status: DC
Start: 2023-10-17 — End: 2024-04-01

## 2023-10-17 MED ORDER — ALBUTEROL SULFATE HFA 108 (90 BASE) MCG/ACT IN AERS: 2.0000 | INHALATION_SPRAY | RESPIRATORY_TRACT | 0 refills | Status: DC | PRN

## 2023-10-17 NOTE — Plan of Care (Signed)

## 2023-10-17 NOTE — Discharge Summary (Signed)
 [name] [name], is a 58 y.o. male  DOB 1966/03/07  MRN [id number].  Admission date:  10/14/2023  Admitting Physician  Bobbetta Burnet, MD  Discharge Date:  10/17/2023   Primary MD  Minus Amel, MD  Recommendations for primary care physician for things to follow:  1)Please Take Lantus  Insulin  20 units every evening---for now until you start Ozempic and then your Primary Care Provider may reduce the Lantus  insulin  dose 2)Repeat CBC and BMP Blood Tests in about 1 week  Admission Diagnosis  Sepsis due to urinary tract infection (HCC) [A41.9, N39.0] Sepsis (HCC) [A41.9] Sepsis, due to unspecified organism, unspecified whether acute organ dysfunction present [facility]) [A41.9]   Discharge Diagnosis  Sepsis due to urinary tract infection (HCC) [A41.9, N39.0] Sepsis (HCC) [A41.9] Sepsis, due to unspecified organism, unspecified whether acute organ dysfunction present (HCC) [A41.9]    Principal Problem:   Sepsis (HCC) Active Problems:   Sepsis due to urinary tract infection (HCC)   Uncontrolled type 2 diabetes mellitus with hyperglycemia (HCC)   Essential hypertension   Status post splenectomy   OSA on CPAP   RBBB   GERD   Irritable bowel syndrome   Depression   Hypokalemia   Hypothyroidism   Spinal stenosis of lumbar region   Urinary tract infection   Obesity, Class III, BMI 40-49.9 (morbid obesity)   Pure hypercholesterolemia      Past Medical History:  Diagnosis Date   Arthritis    Asthma    as child   Back pain, chronic 02/18/11   BULGING DISK   Complication of anesthesia    "woke up during spleenectomy at [facility]".   Diabetes mellitus without complication (HCC)    GERD (gastroesophageal reflux disease)    erosive reflux esophagitis, small HH   Hemorrhoids    History of kidney stones    Hyperlipemia    Hypertension    Hypothyroidism    IBS (irritable bowel syndrome)    Low serum cortisol  level    Dr Arley Bending   Lower extremity edema    Nephrolithiasis 12/19/2012   Plantar fasciitis    Restless leg syndrome    S/P colonoscopy 08/2008   int/ext hemorrhoids. friable, rectal polyp   S/P endoscopy 01/10/09   Dr Davonna Estes, small [facility]   Sleep apnea    cpap   Spinal stenosis of lumbar region 12/18/2012   Thyroid  disease    Tubular adenoma of colon 2000   UTI (lower urinary tract infection)     Past Surgical History:  Procedure Laterality Date   BIOPSY  02/26/2022   Procedure: BIOPSY;  Surgeon: Vinetta Greening, DO;  [facility];  Service: Endoscopy;;   CHOLECYSTECTOMY  2006   COLONOSCOPY  2000   Dr. Riley Cheadle- tubular adenoma   COLONOSCOPY  08/10/2008   Dr. Riley Cheadle-  IMPRESSION:  Prominent external anal canal hemorrhoids, hyperplastic polyp   COLONOSCOPY N/A 10/08/2013   NFA:OZHYQMVH colonic polyps -  removed as described above/Prominent grade 3 hemorrhoids -  likely source of hematochezia. tubular adenomas.  next TCS 10/2016   COLONOSCOPY WITH PROPOFOL  N/A 02/25/2017   Procedure: COLONOSCOPY WITH PROPOFOL ;  Surgeon: Suzette Espy, MD;  Grade 4 internal hemorrhoids, 1 tubular adenoma, recommendations to repeat in 5 years.    COLONOSCOPY WITH PROPOFOL  N/A 02/26/2022   Procedure: COLONOSCOPY WITH PROPOFOL ;  Surgeon: Vinetta Greening, DO;  [facility];  Service: Endoscopy;  Laterality: N/A;  9:45am, asa 3, ASAP   CYSTOSCOPY     with utetheral dilation   DISTAL BICEPS TENDON REPAIR  02/20/2012   Procedure: DISTAL BICEPS TENDON REPAIR;  Surgeon: Genevie Kerns, MD;  Location: [facility];  Service: Orthopedics;  Laterality: Left;  left elbow distal biceps tendon repair   ESOPHAGOGASTRODUODENOSCOPY  2010   Dr. Riley Cheadle: normal   ESOPHAGOGASTRODUODENOSCOPY (EGD) WITH PROPOFOL  N/A 02/26/2022   Procedure: ESOPHAGOGASTRODUODENOSCOPY (EGD) WITH PROPOFOL ;  Surgeon: Vinetta Greening, DO;  [facility];  Service: Endoscopy;  Laterality: N/A;   FLEXIBLE  SIGMOIDOSCOPY N/A 11/10/2013   Dr.Rourk- internal/external hemorrhoids. external hemorrhoids grade 3 with some residual prolapsing tissue. normal rectum aside form hemorrhoids. s/p band placement.   HEMORRHOID BANDING N/A 11/10/2013   Procedure: HEMORRHOID BANDING;  Surgeon: Suzette Espy, MD;  [facility];  Service: Endoscopy;  Laterality: N/A;   HERNIA REPAIR     INCISIONAL HERNIA REPAIR N/A 04/10/2017   Procedure: Estrella Hench HERNIORRHAPHY WITH MESH;  Surgeon: Alanda Allegra, MD;  Location: [facility];  Service: General;  Laterality: N/A;   KNEE SURGERY     left   POLYPECTOMY  02/25/2017   Procedure: POLYPECTOMY - ASCENDING COLON;  Surgeon: Suzette Espy, MD;  [facility];  Service: Endoscopy;;   POLYPECTOMY  02/26/2022   Procedure: POLYPECTOMY;  Surgeon: Vinetta Greening, DO;  [facility];  Service: Endoscopy;;  gastric ; colon   SPLENECTOMY  2003   itp       HPI  from the history and physical done on the day of admission:    [name] [name] is a 58 year old male with history of HTN, DM 2, adrenal insufficiency, hypothyroidism, with recent treatment of dental infection presented with fatigue malaise and fever. Currently patient was recently on antibiotics for dental infection   ED evaluation: Upon arrival Tmax 101.4, pulse as high as 130, RR 30, 98% on CPAP Blood pressure 115/72, pulse 98, temperature 98.7 F (37.1 C), temperature source Oral, resp. rate 19, height 5\' 7"  (1.702 m), weight 115.7 kg, SpO2 98%.   WBC 14.2, sodium 130, potassium 3.0, calcium  25, 34, lactic acid 2.8, 2.5,   On arrival patient was found febrile tachypneic, tachycardic, with leukocytosis elevated lactic acids.  Met sepsis criteria therefore per sepsis protocol IV fluid resuscitation with broad-spectrum antibiotics and steroids was initiated   Patient will be admitted for sepsis.  Source of infection possible UTI, ruling out dental cavity, abscess.  Obtaining CT maxillary         Patient Denies having:  Cough, SOB, Chest Pain, Abd pain, N/V/D, headache, dizziness, lightheadedness,  Dysuria, Joint pain, rash, open wounds     Review of Systems: As per HPI, otherwise 10 point review of systems were negative.      Hospital Course:     1)Klebsiella Sepsis --POA blood cultures from 10/14/2023 with Klebsiella  -Repeat blood cultures from 10/16/2023 NGTD - Urine culture NGTD - -Patient is status post prior splenectomy in 2003 due to hypersplenism with thrombocytopenia at that time,--status post splenectomy puts patient at  risk for encapsulated organism infection due to lack of Opsonins--Klebsiella is a member of that encapsulated organism group -CT facial maxillary was obtain and reviewed - ruled out abscess - CT abdomen and pelvis without acute findings - Patient received cefepime , Flagyl , vancomycin , - de-escalated to Rocephin  monotherapy on 10/16/2023  - No further fevers -WBC has normalized -Per ID pharmacist okay to discharge on Bactrim  double strength 1 twice daily for 1 week -Repeat CBC and  BMP after completing Bactrim --patient takes losartan  with risk of AKI with hyperkalemia while on Bactrim    2)DM2-A1c 12.3 reflecting uncontrolled DM with hyperglycemia PTA Continue PTA linagliptin  and  metformin , -Continue linagliptin  - Discharged on Lantus  insulin  20 units nightly for now, Lantus  dose can be further adjusted by PCP once patient starts Ozempic   3)HTN--patient takes losartan  with risk of AKI with hyperkalemia while on Bactrim  -Continue amlodipine  -Patient is reluctant to hold losartan  while on Bactrim , so repeat BMP in 1 week --   4)Morbid Obesity/OSA -Low calorie diet, portion control and increase physical activity discussed with patient -Body mass index is 44.27 kg/m. --- Continue CPAP nightly   5)HLD--- Continue home dose simvastatin    6)Hypothyroidism - Continue home dose Synthroid    7)Depression -stable continue Cymbalta    8)Irritable  bowel syndrome -Currently stable, monitoring   9)GERD -Continue PPI   10)BPH-continue Flomax    11)Chronic adrenal insufficiency-continue home medication of hydrocortisone    Discharge Condition: stable  Follow UP   Follow-up Information     Minus Amel, MD Follow up in 1 week(s).   Specialty: Family Medicine Why: Repeat CBC and BMP in 1 week Contact information: [address] [place] [address] [phone]                  Consults obtained - ID Pharmacist  Diet and Activity recommendation:  As advised  Discharge Instructions   Discharge Instructions     Diet - low sodium heart healthy   Complete by: As directed    Discharge instructions   Complete by: As directed    1)Please Take Lantus  Insulin  20 units every evening---for now until you start Ozempic and then your Primary Care Provider may reduce the Lantus  insulin  dose 2)Repeat CBC and BMP Blood Tests in about 1 week   Increase activity slowly   Complete by: As directed          Discharge Medications     Allergies as of 10/17/2023       Reactions   Dilaudid  [hydromorphone ] Other (See Comments)   "It makes me go crazy." Per patient    Pregabalin    Other reaction(s): anaphylaxis        Medication List     STOP taking these medications    clindamycin 150 MG capsule Commonly known as: CLEOCIN   erythromycin 250 MG tablet Commonly known as: E-MYCIN   ibuprofen  200 MG tablet Commonly known as: ADVIL        TAKE these medications    albuterol  108 (90 Base) MCG/ACT inhaler Commonly known as: VENTOLIN  HFA Inhale 2 puffs into the lungs every 4 (four) hours as needed for wheezing or shortness of breath.   amLODipine  10 MG tablet Commonly known as: NORVASC  Take 1 tablet (10 mg total) by mouth daily.   blood glucose meter kit and supplies Dispense based on patient and insurance preference. Use up to four times daily as directed. (FOR ICD-10 E10.9, E11.9).   DULoxetine  60  MG capsule Commonly known as: CYMBALTA  Take 120 mg by  mouth at bedtime.   hydrocortisone  20 MG tablet Commonly known as: CORTEF  Take 1 tablet (20 mg total) by mouth daily. For cortisol replacement What changed: additional instructions   insulin  glargine 100 UNIT/ML Solostar Pen Commonly known as: LANTUS  Inject 20 Units into the skin at bedtime.   levothyroxine  150 MCG tablet Commonly known as: SYNTHROID  Take 150 mcg by mouth every morning.   losartan  100 MG tablet Commonly known as: COZAAR  Take 100 mg by mouth daily.   metFORMIN  1000 MG tablet Commonly known as: GLUCOPHAGE  Take 1 tablet (1,000 mg total) by mouth 2 (two) times daily.   omeprazole  40 MG capsule Commonly known as: PRILOSEC Take 1 capsule (40 mg total) by mouth 2 (two) times daily. What changed:  how much to take when to take this   ondansetron  4 MG disintegrating tablet Commonly known as: ZOFRAN -ODT TAKE 1 TABLET BY MOUTH EVERY 8 HOURS AS NEEDED FOR NAUSEA OR VOMITING.   simvastatin  40 MG tablet Commonly known as: ZOCOR  SMARTSIG:1 Tablet(s) By Mouth Every Evening   sitaGLIPtin  100 MG tablet Commonly known as: JANUVIA  Take 1 tablet (100 mg total) by mouth at bedtime.   sulfamethoxazole -trimethoprim  800-160 MG tablet Commonly known as: Bactrim  DS Take 1 tablet by mouth 2 (two) times daily for 7 days.   tamsulosin  0.4 MG Caps capsule Commonly known as: FLOMAX  Take 0.4 mg by mouth in the morning.       Major procedures and Radiology Reports - PLEASE review detailed and final reports for all details, in brief -   DG Chest 2 View Result Date: 10/17/2023 CLINICAL DATA:  Dyspnea EXAM: CHEST - 2 VIEW COMPARISON:  Oct 14, 2023 FINDINGS: Subtle bilateral reticular interstitial infiltrates correlate with minimal congestive changes without consolidations No pleural effusion Heart normal size IMPRESSION: Subtle congestive changes Electronically Signed   By: Fredrich Jefferson M.D.   On: 10/17/2023 08:18   CT  MAXILLOFACIAL WO CONTRAST Result Date: 10/14/2023 CLINICAL DATA:  Sublingual/submandibular abscess. Tachycardia and infection. History of infected tooth removal 2 weeks ago EXAM: CT MAXILLOFACIAL WITHOUT CONTRAST TECHNIQUE: Multidetector CT imaging of the maxillofacial structures was performed. Multiplanar CT image reconstructions were also generated. RADIATION DOSE REDUCTION: This exam was performed according to the departmental dose-optimization program which includes automated exposure control, adjustment of the mA and/or kV according to patient size and/or use of iterative reconstruction technique. COMPARISON:  None Available. FINDINGS: Osseous: No fracture or mandibular dislocation. No destructive process. Empty sixth and seventh alveoli correlating with history of recent extraction. Periapical lucency around tooth 20. Left upper canine is notably carious. Orbits: Chronic deformity of the left lamina papyracea Sinuses: Clear. Soft tissues: Negative. Limited intracranial: No significant or unexpected finding. IMPRESSION: Dental caries with no evidence of odontogenic soft tissue infection. Electronically Signed   By: Ronnette Coke M.D.   On: 10/14/2023 10:18   CT ABDOMEN PELVIS WO CONTRAST Result Date: 10/14/2023 CLINICAL DATA:  Sepsis EXAM: CT ABDOMEN AND PELVIS WITHOUT CONTRAST TECHNIQUE: Multidetector CT imaging of the abdomen and pelvis was performed following the standard protocol without IV contrast. RADIATION DOSE REDUCTION: This exam was performed according to the departmental dose-optimization program which includes automated exposure control, adjustment of the mA and/or kV according to patient size and/or use of iterative reconstruction technique. COMPARISON:  12/08/2021 FINDINGS: Lower chest:  Coronary atherosclerosis. Hepatobiliary: Large caudate lobe without surface lobulation.Cholecystectomy. Pancreas: Unremarkable. Spleen: Unremarkable. Adrenals/Urinary Tract: Negative adrenals. No  hydronephrosis or ureteral stone. Small left upper pole renal calculus. Unremarkable bladder. Stomach/Bowel:  No obstruction. No visible bowel inflammation Vascular/Lymphatic: No acute vascular abnormality. Scattered atheromatous calcification no mass or adenopathy. Reproductive:Mild enlargement of the prostate with dystrophic type calcification. Other: No ascites or pneumoperitoneum. Musculoskeletal: No acute abnormalities. Generalized lumbar spine degeneration. IMPRESSION: No evidence of intra-abdominal infection or abscess. Atherosclerosis and left nephrolithiasis. Electronically Signed   By: Ronnette Coke M.D.   On: 10/14/2023 05:36   DG Chest Port 1 View Result Date: 10/14/2023 CLINICAL DATA:  Initial evaluation for possible sepsis. EXAM: PORTABLE CHEST 1 VIEW COMPARISON:  Prior radiograph from 06/24/2022 FINDINGS: Cardiomegaly.  Mediastinal silhouette within normal limits. Lungs are hypoinflated with elevation of the right hemidiaphragm. Underlying perihilar vascular and interstitial prominence, suggesting a degree of pulmonary interstitial congestion. No consolidative airspace disease. No visible pleural effusion. No pneumothorax. Visualized soft tissues and osseous structures demonstrate no acute finding. Osteoarthritic changes noted about the shoulders. IMPRESSION: 1. Cardiomegaly with diffuse vascular and interstitial prominence, suggesting mild pulmonary interstitial congestion/edema. 2. Underlying low lung volumes with chronic elevation of the right hemidiaphragm. Electronically Signed   By: Virgia Griffins M.D.   On: 10/14/2023 04:10    Micro Results  Recent Results (from the past 240 hours)  Blood Culture (routine x 2)     Status: Abnormal   Collection Time: 10/14/23  3:06 AM   Specimen: BLOOD LEFT ARM  Result Value Ref Range Status   Specimen Description   Final    BLOOD LEFT ARM Performed at [facility], [address] [place], [place] [address]    Special Requests   Final     BOTTLES DRAWN AEROBIC AND ANAEROBIC Blood Culture adequate volume Performed at [facility], [address], [place], [place] [address]    Culture  Setup Time   Final    GRAM NEGATIVE RODS IN BOTH AEROBIC AND ANAEROBIC BOTTLES Gram Stain Report Called to,Read Back By and Verified With: Eduardo Grade, RN AT 2059 10/14/23 BY A. SNYDER CRITICAL VALUE NOTED.  VALUE IS CONSISTENT WITH PREVIOUSLY REPORTED AND CALLED VALUE.    Culture (A)  Final    KLEBSIELLA PNEUMONIAE SUSCEPTIBILITIES PERFORMED ON PREVIOUS CULTURE WITHIN THE LAST 5 DAYS. Performed at [facility], [address] [place], [place] [address]    Report Status 10/17/2023 FINAL  Final  Blood Culture (routine x 2)     Status: Abnormal   Collection Time: 10/14/23  3:10 AM   Specimen: BLOOD LEFT HAND  Result Value Ref Range Status   Specimen Description   Final    BLOOD LEFT HAND Performed at [facility], [address], [place], [place] [address]    Special Requests   Final    BOTTLES DRAWN AEROBIC AND ANAEROBIC Blood Culture adequate volume Performed at [facility] [address] [place], [place] [address]    Culture  Setup Time   Final    GRAM NEGATIVE RODS AEROBIC BOTTLE ONLY Gram Stain Report Called to,Read Back By and Verified With: Eduardo Grade, RN AT 2059 10/14/23 BY A. SNYDER CRITICAL RESULT CALLED TO, READ BACK BY AND VERIFIED WITH: C GANOE,RN@0240  10/15/23 MK Performed at [facility], [address] [place], [place] [address]    Culture KLEBSIELLA PNEUMONIAE (A)  Final   Report Status 10/17/2023 FINAL  Final   Organism ID, Bacteria KLEBSIELLA PNEUMONIAE  Final   Organism ID, Bacteria KLEBSIELLA PNEUMONIAE  Final      Susceptibility   Klebsiella pneumoniae - KIRBY BAUER*    CEFAZOLIN  SENSITIVE Sensitive    Klebsiella  pneumoniae - MIC*    AMPICILLIN >=32 RESISTANT Resistant     CEFEPIME  <=0.12 SENSITIVE Sensitive     CEFTAZIDIME <=1 SENSITIVE Sensitive     CEFTRIAXONE  <=0.25 SENSITIVE  Sensitive     CIPROFLOXACIN <=0.25 SENSITIVE Sensitive     GENTAMICIN <=1 SENSITIVE Sensitive     IMIPENEM <=0.25 SENSITIVE Sensitive     TRIMETH /SULFA  <=20 SENSITIVE Sensitive     AMPICILLIN/SULBACTAM 4 SENSITIVE Sensitive     PIP/TAZO <=4 SENSITIVE Sensitive ug/mL    * KLEBSIELLA PNEUMONIAE    KLEBSIELLA PNEUMONIAE  Blood Culture ID Panel (Reflexed)     Status: Abnormal   Collection Time: 10/14/23  3:10 AM  Result Value Ref Range Status   Enterococcus faecalis NOT DETECTED NOT DETECTED Final   Enterococcus Faecium NOT DETECTED NOT DETECTED Final   Listeria monocytogenes NOT DETECTED NOT DETECTED Final   Staphylococcus species NOT DETECTED NOT DETECTED Final   Staphylococcus aureus (BCID) NOT DETECTED NOT DETECTED Final   Staphylococcus epidermidis NOT DETECTED NOT DETECTED Final   Staphylococcus lugdunensis NOT DETECTED NOT DETECTED Final   Streptococcus species NOT DETECTED NOT DETECTED Final   Streptococcus agalactiae NOT DETECTED NOT DETECTED Final   Streptococcus pneumoniae NOT DETECTED NOT DETECTED Final   Streptococcus pyogenes NOT DETECTED NOT DETECTED Final   A.calcoaceticus-baumannii NOT DETECTED NOT DETECTED Final   Bacteroides fragilis NOT DETECTED NOT DETECTED Final   Enterobacterales DETECTED (A) NOT DETECTED Final    Comment: Enterobacterales represent a large order of gram negative bacteria, not a single organism. CRITICAL RESULT CALLED TO, READ BACK BY AND VERIFIED WITH: C GANOE,RN@0240  10/15/23 MK    Enterobacter cloacae complex NOT DETECTED NOT DETECTED Final   Escherichia coli NOT DETECTED NOT DETECTED Final   Klebsiella aerogenes NOT DETECTED NOT DETECTED Final   Klebsiella oxytoca NOT DETECTED NOT DETECTED Final   Klebsiella pneumoniae DETECTED (A) NOT DETECTED Final    Comment: CRITICAL RESULT CALLED TO, READ BACK BY AND VERIFIED WITH: C GANOE,RN@0240  10/15/23 MK    Proteus species NOT DETECTED NOT DETECTED Final   Salmonella species NOT DETECTED NOT  DETECTED Final   Serratia marcescens NOT DETECTED NOT DETECTED Final   Haemophilus influenzae NOT DETECTED NOT DETECTED Final   Neisseria meningitidis NOT DETECTED NOT DETECTED Final   Pseudomonas aeruginosa NOT DETECTED NOT DETECTED Final   Stenotrophomonas maltophilia NOT DETECTED NOT DETECTED Final   Candida albicans NOT DETECTED NOT DETECTED Final   Candida auris NOT DETECTED NOT DETECTED Final   Candida glabrata NOT DETECTED NOT DETECTED Final   Candida krusei NOT DETECTED NOT DETECTED Final   Candida parapsilosis NOT DETECTED NOT DETECTED Final   Candida tropicalis NOT DETECTED NOT DETECTED Final   Cryptococcus neoformans/gattii NOT DETECTED NOT DETECTED Final   CTX-M ESBL NOT DETECTED NOT DETECTED Final   Carbapenem resistance IMP NOT DETECTED NOT DETECTED Final   Carbapenem resistance KPC NOT DETECTED NOT DETECTED Final   Carbapenem resistance NDM NOT DETECTED NOT DETECTED Final   Carbapenem resist OXA 48 LIKE NOT DETECTED NOT DETECTED Final   Carbapenem resistance VIM NOT DETECTED NOT DETECTED Final    Comment: Performed at [facility], [address], [place], [place] [address]  Resp panel by RT-PCR (RSV, Flu A&B, Covid) Anterior Nasal Swab     Status: None   Collection Time: 10/14/23  3:27 AM   Specimen: Anterior Nasal Swab  Result Value Ref Range Status   SARS Coronavirus 2 by RT PCR NEGATIVE NEGATIVE Final  Comment: (NOTE) SARS-CoV-2 target nucleic acids are NOT DETECTED.  The SARS-CoV-2 RNA is generally detectable in upper respiratory specimens during the acute phase of infection. The lowest concentration of SARS-CoV-2 viral copies this assay can detect is 138 copies/mL. A negative result does not preclude SARS-Cov-2 infection and should not be used as the sole basis for treatment or other patient management decisions. A negative result may occur with  improper specimen collection/handling, submission of specimen other than nasopharyngeal swab, presence of  viral mutation(s) within the areas targeted by this assay, and inadequate number of viral copies(<138 copies/mL). A negative result must be combined with clinical observations, patient history, and epidemiological information. The expected result is Negative.  Fact Sheet for Patients:  BloggerCourse.com  Fact Sheet for Healthcare Providers:  SeriousBroker.it  This test is no t yet approved or cleared by the [place]  FDA and  has been authorized for detection and/or diagnosis of SARS-CoV-2 by FDA under an Emergency Use Authorization (EUA). This EUA will remain  in effect (meaning this test can be used) for the duration of the COVID-19 declaration under Section 564(b)(1) of the Act, 21 U.S.C.section 360bbb-3(b)(1), unless the authorization is terminated  or revoked sooner.       Influenza A by PCR NEGATIVE NEGATIVE Final   Influenza B by PCR NEGATIVE NEGATIVE Final    Comment: (NOTE) The Xpert Xpress SARS-CoV-2/FLU/RSV plus assay is intended as an aid in the diagnosis of influenza from Nasopharyngeal swab specimens and should not be used as a sole basis for treatment. Nasal washings and aspirates are unacceptable for Xpert Xpress SARS-CoV-2/FLU/RSV testing.  Fact Sheet for Patients: BloggerCourse.com  Fact Sheet for Healthcare Providers: SeriousBroker.it  This test is not yet approved or cleared by the [place]  FDA and has been authorized for detection and/or diagnosis of SARS-CoV-2 by FDA under an Emergency Use Authorization (EUA). This EUA will remain in effect (meaning this test can be used) for the duration of the COVID-19 declaration under Section 564(b)(1) of the Act, 21 U.S.C. section 360bbb-3(b)(1), unless the authorization is terminated or revoked.     Resp Syncytial Virus by PCR NEGATIVE NEGATIVE Final    Comment: (NOTE) Fact Sheet for  Patients: BloggerCourse.com  Fact Sheet for Healthcare Providers: SeriousBroker.it  This test is not yet approved or cleared by the [place]  FDA and has been authorized for detection and/or diagnosis of SARS-CoV-2 by FDA under an Emergency Use Authorization (EUA). This EUA will remain in effect (meaning this test can be used) for the duration of the COVID-19 declaration under Section 564(b)(1) of the Act, 21 U.S.C. section 360bbb-3(b)(1), unless the authorization is terminated or revoked.  Performed at [facility], [address], [place], [place] [address]   Urine Culture     Status: None   Collection Time: 10/14/23  6:07 AM   Specimen: Urine, Random  Result Value Ref Range Status   Specimen Description   Final    URINE, RANDOM Performed at [facility], [address], [place], [place] [address]    Special Requests   Final    NONE Reflexed from [id number] Performed at [facility], [address] [place], [place] [address]    Culture   Final    NO GROWTH Performed at [facility], [address], [place], [place] [address]    Report Status 10/15/2023 FINAL  Final  Culture, blood (Routine X 2) w Reflex to ID Panel     Status: None (Preliminary result)   Collection  Time: 10/16/23 12:25 AM   Specimen: BLOOD  Result Value Ref Range Status   Specimen Description BLOOD BLOOD RIGHT ARM  Final   Special Requests   Final    BOTTLES DRAWN AEROBIC AND ANAEROBIC Blood Culture adequate volume   Culture   Final    NO GROWTH 1 DAY Performed at [facility], [address], [place], [place] [address]    Report Status PENDING  Incomplete  Culture, blood (Routine X 2) w Reflex to ID Panel     Status: None (Preliminary result)   Collection Time: 10/16/23 12:27 AM   Specimen: BLOOD  Result Value Ref Range Status   Specimen Description BLOOD BLOOD RIGHT ARM  Final   Special Requests   Final    BOTTLES DRAWN AEROBIC AND ANAEROBIC  Blood Culture adequate volume   Culture   Final    NO GROWTH 1 DAY Performed at [facility], [address] [place], [place] [address]    Report Status PENDING  Incomplete    Today   Subjective    Rontrell Moquin today has no further fevers - No nausea no vomiting no diarrhea - No dysuria, no flank pain   Patient has been seen and examined prior to discharge   Objective   Blood pressure (!) 142/85, pulse 68, temperature 97.6 F (36.4 C), temperature source Oral, resp. rate (!) 21, height 5\' 7"  (1.702 m), weight 127 kg, SpO2 98%.   Intake/Output Summary (Last 24 hours) at 10/17/2023 1548 Last data filed at 10/17/2023 1200 Gross per 24 hour  Intake 580 ml  Output 3225 ml  Net -2645 ml    Exam Gen:- Awake Alert, no acute distress , morbidly obese HEENT:- [place].AT, No sclera icterus Neck-Supple Neck,No JVD,.  Lungs-  CTAB , good air movement bilaterally CV- S1, S2 normal, regular Abd-  +ve B.Sounds, Abd Soft, No tenderness, no CVA area tenderness - Increased truncal adiposity noted    Extremity/Skin:- No  edema,   good pulses Psych-affect is appropriate, oriented x3 Neuro-no new focal deficits, no tremors    Data Review   CBC w Diff:  Lab Results  Component Value Date   WBC 8.6 10/17/2023   HGB 11.4 (L) 10/17/2023   HCT 35.2 (L) 10/17/2023   PLT 451 (H) 10/17/2023   LYMPHOPCT 5 10/14/2023   MONOPCT 4 10/14/2023   EOSPCT 0 10/14/2023   BASOPCT 0 10/14/2023    CMP:  Lab Results  Component Value Date   NA 136 10/17/2023   K 3.5 10/17/2023   CL 104 10/17/2023   CO2 21 (L) 10/17/2023   BUN 9 10/17/2023   CREATININE 0.69 10/17/2023   PROT 7.2 10/14/2023   ALBUMIN 3.0 (L) 10/14/2023   BILITOT 0.6 10/14/2023   ALKPHOS 45 10/14/2023   AST 19 10/14/2023   ALT 19 10/14/2023  .  Total Discharge time is about 33 minutes  Colin Dawley M.D on 10/17/2023 at 3:48 PM  Go to [email or web address] -  for contact info  [facility] - Office  [phone]

## 2023-10-21 LAB — CULTURE, BLOOD (ROUTINE X 2)
Culture: NO GROWTH
Culture: NO GROWTH
Special Requests: ADEQUATE
Special Requests: ADEQUATE

## 2023-12-18 ENCOUNTER — Other Ambulatory Visit (HOSPITAL_COMMUNITY): Payer: Self-pay | Admitting: Physician Assistant

## 2023-12-18 ENCOUNTER — Ambulatory Visit (HOSPITAL_COMMUNITY)
Admission: RE | Admit: 2023-12-18 | Discharge: 2023-12-18 | Disposition: A | Discharge status: Home / Self Care | Payer: Self-pay | Source: Ambulatory Visit | Attending: Physician Assistant | Admitting: Physician Assistant

## 2023-12-18 DIAGNOSIS — R159 Full incontinence of feces: Secondary | ICD-10-CM

## 2023-12-26 ENCOUNTER — Other Ambulatory Visit: Payer: Self-pay | Admitting: Internal Medicine

## 2024-03-13 NOTE — Congregational Nurse Program (Signed)
 Attempted wellness call to [facility] client by Caldwell GAILS MSW intern [facility].  Person that answered the phone stated that his father [name] [name] had been shot and killed 2 days prior unsure if this is correct information.  Call ended will monitor for client attending next PCP appointment.  PCP [facility] Last seen 12/18/23  Next appointment 03/24/24    [name] JONELLE Skeen RN [facility]

## 2024-04-04 DEATH — deceased
